# Patient Record
Sex: Male | Born: 1950 | Race: White | Hispanic: No | Marital: Single | Smoking: Never smoker
Health system: Southern US, Community
[De-identification: ages and names within clinical notes are randomized; demographics above are authoritative.]

## PROBLEM LIST (undated history)

## (undated) DIAGNOSIS — I96 Gangrene, not elsewhere classified: Secondary | ICD-10-CM

## (undated) DIAGNOSIS — I10 Essential (primary) hypertension: Secondary | ICD-10-CM

## (undated) DIAGNOSIS — E119 Type 2 diabetes mellitus without complications: Secondary | ICD-10-CM

## (undated) HISTORY — PX: TOE AMPUTATION: SHX809

## (undated) HISTORY — PX: TONSILLECTOMY: SUR1361

---
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
## 2006-07-11 ENCOUNTER — Inpatient Hospital Stay (HOSPITAL_COMMUNITY): Admission: EM | Admit: 2006-07-11 | Discharge: 2006-07-17 | Payer: Self-pay

## 2010-02-03 ENCOUNTER — Emergency Department (HOSPITAL_COMMUNITY): Admission: EM | Admit: 2010-02-03 | Discharge: 2010-02-04 | Payer: Self-pay | Admitting: Emergency Medicine

## 2010-02-04 ENCOUNTER — Ambulatory Visit: Payer: Self-pay | Admitting: Psychiatry

## 2010-02-04 ENCOUNTER — Inpatient Hospital Stay (HOSPITAL_COMMUNITY): Admission: EM | Admit: 2010-02-04 | Discharge: 2010-03-10 | Payer: Self-pay | Admitting: Psychiatry

## 2010-04-20 ENCOUNTER — Emergency Department (HOSPITAL_COMMUNITY): Admission: EM | Admit: 2010-04-20 | Discharge: 2010-04-21 | Payer: Self-pay | Admitting: Emergency Medicine

## 2010-04-21 ENCOUNTER — Ambulatory Visit: Payer: Self-pay | Admitting: Psychiatry

## 2010-10-27 LAB — COMPREHENSIVE METABOLIC PANEL
ALT: 32 U/L (ref 0–53)
Albumin: 4.5 g/dL (ref 3.5–5.2)
BUN: 9 mg/dL (ref 6–23)
Calcium: 9.6 mg/dL (ref 8.4–10.5)
Chloride: 104 mEq/L (ref 96–112)
Creatinine, Ser: 0.95 mg/dL (ref 0.4–1.5)
GFR calc Af Amer: >60 mL/min (ref >60)
GFR calc non Af Amer: >60 mL/min (ref >60)
Glucose, Bld: 234 mg/dL — ABNORMAL HIGH (ref 70–99)
Potassium: 3.6 mEq/L (ref 3.5–5.1)
Sodium: 142 mEq/L (ref 135–145)

## 2010-10-27 LAB — DIFFERENTIAL
Basophils Absolute: 0.1 10*3/uL (ref 0.0–0.1)
Eosinophils Absolute: 0.1 10*3/uL (ref 0.0–0.7)
Eosinophils Relative: 2 % (ref 0–5)
Lymphocytes Relative: 22 % (ref 12–46)
Monocytes Absolute: 0.5 10*3/uL (ref 0.1–1.0)
Neutrophils Relative %: 70 % (ref 43–77)

## 2010-10-27 LAB — URINALYSIS, ROUTINE W REFLEX MICROSCOPIC
Hgb urine dipstick: NEGATIVE
Protein, ur: NEGATIVE mg/dL

## 2010-10-27 LAB — CBC
MCV: 90.3 fL (ref 78.0–100.0)
RDW: 13.6 % (ref 11.5–15.5)

## 2010-10-27 LAB — RAPID URINE DRUG SCREEN, HOSP PERFORMED
Amphetamines: NOT DETECTED
Barbiturates: NOT DETECTED
Benzodiazepines: NOT DETECTED
Cocaine: NOT DETECTED
Opiates: NOT DETECTED

## 2010-10-27 LAB — ETHANOL: Alcohol, Ethyl (B): <5 mg/dL (ref 0–10)

## 2010-10-30 LAB — COMPREHENSIVE METABOLIC PANEL
Alkaline Phosphatase: 39 U/L (ref 39–117)
BUN: 9 mg/dL (ref 6–23)
Calcium: 8.5 mg/dL (ref 8.4–10.5)
Chloride: 105 mEq/L (ref 96–112)
Creatinine, Ser: 0.96 mg/dL (ref 0.4–1.5)
Glucose, Bld: 227 mg/dL — ABNORMAL HIGH (ref 70–99)
Total Protein: 6 g/dL (ref 6.0–8.3)

## 2010-10-30 LAB — CBC
MCH: 31.6 pg (ref 26.0–34.0)
Platelets: 200 10*3/uL (ref 150–400)
WBC: 7.8 10*3/uL (ref 4.0–10.5)

## 2010-10-30 LAB — GLUCOSE, CAPILLARY: Glucose-Capillary: 208 mg/dL — ABNORMAL HIGH (ref 70–99)

## 2010-10-30 LAB — RAPID URINE DRUG SCREEN, HOSP PERFORMED
Amphetamines: NOT DETECTED
Barbiturates: NOT DETECTED
Benzodiazepines: NOT DETECTED
Cocaine: NOT DETECTED
Opiates: NOT DETECTED
Tetrahydrocannabinol: NOT DETECTED

## 2010-10-30 LAB — DIFFERENTIAL
Basophils Absolute: 0 10*3/uL (ref 0.0–0.1)
Eosinophils Absolute: 0.1 10*3/uL (ref 0.0–0.7)
Eosinophils Relative: 2 % (ref 0–5)
Lymphocytes Relative: 21 % (ref 12–46)
Lymphs Abs: 1.6 10*3/uL (ref 0.7–4.0)
Monocytes Absolute: 0.4 10*3/uL (ref 0.1–1.0)
Monocytes Relative: 5 % (ref 3–12)
Neutro Abs: 5.6 10*3/uL (ref 1.7–7.7)

## 2010-10-30 LAB — ACETAMINOPHEN LEVEL: Acetaminophen (Tylenol), Serum: <10 ug/mL — ABNORMAL LOW (ref 10–30)

## 2010-10-30 LAB — URINALYSIS, ROUTINE W REFLEX MICROSCOPIC
Protein, ur: NEGATIVE mg/dL
pH: 6.5 (ref 5.0–8.0)

## 2010-10-30 LAB — ETHANOL: Alcohol, Ethyl (B): 5 mg/dL (ref 0–10)

## 2010-10-30 LAB — SALICYLATE LEVEL: Salicylate Lvl: <4 mg/dL (ref 2.8–20.0)
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
## 2011-02-06 ENCOUNTER — Emergency Department (HOSPITAL_COMMUNITY): Payer: Self-pay

## 2011-02-06 ENCOUNTER — Emergency Department (HOSPITAL_COMMUNITY)
Admission: EM | Admit: 2011-02-06 | Discharge: 2011-02-06 | Disposition: A | Discharge status: Home / Self Care | Payer: Self-pay | Attending: Emergency Medicine | Admitting: Emergency Medicine

## 2011-02-06 DIAGNOSIS — IMO0002 Reserved for concepts with insufficient information to code with codable children: Secondary | ICD-10-CM | POA: Insufficient documentation

## 2011-02-06 DIAGNOSIS — S92309A Fracture of unspecified metatarsal bone(s), unspecified foot, initial encounter for closed fracture: Secondary | ICD-10-CM | POA: Insufficient documentation

## 2011-02-06 DIAGNOSIS — M7989 Other specified soft tissue disorders: Secondary | ICD-10-CM | POA: Insufficient documentation

## 2011-02-06 DIAGNOSIS — F3289 Other specified depressive episodes: Secondary | ICD-10-CM | POA: Insufficient documentation

## 2011-02-06 DIAGNOSIS — F329 Major depressive disorder, single episode, unspecified: Secondary | ICD-10-CM | POA: Insufficient documentation

## 2011-02-06 DIAGNOSIS — S92919A Unspecified fracture of unspecified toe(s), initial encounter for closed fracture: Secondary | ICD-10-CM | POA: Insufficient documentation

## 2011-02-06 DIAGNOSIS — M79609 Pain in unspecified limb: Secondary | ICD-10-CM | POA: Insufficient documentation

## 2014-04-20 ENCOUNTER — Encounter (HOSPITAL_COMMUNITY): Payer: Self-pay | Admitting: Emergency Medicine

## 2014-04-20 ENCOUNTER — Emergency Department (HOSPITAL_COMMUNITY)
Admission: EM | Admit: 2014-04-20 | Discharge: 2014-04-20 | Discharge status: Against Medical Advice | Payer: Self-pay | Attending: Emergency Medicine | Admitting: Emergency Medicine

## 2014-04-20 DIAGNOSIS — F3289 Other specified depressive episodes: Secondary | ICD-10-CM | POA: Insufficient documentation

## 2014-04-20 DIAGNOSIS — R7309 Other abnormal glucose: Secondary | ICD-10-CM | POA: Insufficient documentation

## 2014-04-20 DIAGNOSIS — R739 Hyperglycemia, unspecified: Secondary | ICD-10-CM

## 2014-04-20 DIAGNOSIS — Z5900 Homelessness unspecified: Secondary | ICD-10-CM | POA: Insufficient documentation

## 2014-04-20 DIAGNOSIS — Z008 Encounter for other general examination: Secondary | ICD-10-CM | POA: Insufficient documentation

## 2014-04-20 DIAGNOSIS — F32A Depression, unspecified: Secondary | ICD-10-CM

## 2014-04-20 DIAGNOSIS — F329 Major depressive disorder, single episode, unspecified: Secondary | ICD-10-CM | POA: Insufficient documentation

## 2014-04-20 DIAGNOSIS — R45851 Suicidal ideations: Secondary | ICD-10-CM | POA: Insufficient documentation

## 2014-04-20 LAB — COMPREHENSIVE METABOLIC PANEL
ALK PHOS: 67 U/L (ref 39–117)
ALT: 16 U/L (ref 0–53)
AST: 15 U/L (ref 0–37)
Albumin: 3.9 g/dL (ref 3.5–5.2)
Anion gap: 17 — ABNORMAL HIGH (ref 5–15)
BUN: 8 mg/dL (ref 6–23)
CO2: 24 mEq/L (ref 19–32)
Calcium: 9.5 mg/dL (ref 8.4–10.5)
Chloride: 93 mEq/L — ABNORMAL LOW (ref 96–112)
Creatinine, Ser: 0.79 mg/dL (ref 0.50–1.35)
GFR calc non Af Amer: >90 mL/min (ref >90)
GLUCOSE: 490 mg/dL — AB (ref 70–99)
POTASSIUM: 3.3 meq/L — AB (ref 3.7–5.3)
SODIUM: 134 meq/L — AB (ref 137–147)
TOTAL PROTEIN: 7.2 g/dL (ref 6.0–8.3)
Total Bilirubin: 0.8 mg/dL (ref 0.3–1.2)

## 2014-04-20 LAB — CBC
HCT: 41.1 % (ref 39.0–52.0)
HEMOGLOBIN: 14.7 g/dL (ref 13.0–17.0)
MCH: 30.4 pg (ref 26.0–34.0)
MCHC: 35.8 g/dL (ref 30.0–36.0)
MCV: 85.1 fL (ref 78.0–100.0)
PLATELETS: 313 10*3/uL (ref 150–400)
RBC: 4.83 MIL/uL (ref 4.22–5.81)
RDW: 12.7 % (ref 11.5–15.5)
WBC: 8.6 10*3/uL (ref 4.0–10.5)

## 2014-04-20 LAB — RAPID URINE DRUG SCREEN, HOSP PERFORMED
AMPHETAMINES: NOT DETECTED
BARBITURATES: NOT DETECTED
BENZODIAZEPINES: NOT DETECTED
COCAINE: NOT DETECTED
Opiates: NOT DETECTED
TETRAHYDROCANNABINOL: NOT DETECTED

## 2014-04-20 LAB — ETHANOL: Alcohol, Ethyl (B): <11 mg/dL (ref 0–11)

## 2014-04-20 NOTE — Discharge Instructions (Signed)
Depression °Depression refers to feeling sad, low, down in the dumps, blue, gloomy, or empty. In general, there are two kinds of depression: °1. Normal sadness or normal grief. This kind of depression is one that we all feel from time to time after upsetting life experiences, such as the loss of a job or the ending of a relationship. This kind of depression is considered normal, is short lived, and resolves within a few days to 2 weeks. Depression experienced after the loss of a loved one (bereavement) often lasts longer than 2 weeks but normally gets better with time. °2. Clinical depression. This kind of depression lasts longer than normal sadness or normal grief or interferes with your ability to function at home, at work, and in school. It also interferes with your personal relationships. It affects almost every aspect of your life. Clinical depression is an illness. °Symptoms of depression can also be caused by conditions other than those mentioned above, such as: °· Physical illness. Some physical illnesses, including underactive thyroid gland (hypothyroidism), severe anemia, specific types of cancer, diabetes, uncontrolled seizures, heart and lung problems, strokes, and chronic pain are commonly associated with symptoms of depression. °· Side effects of some prescription medicine. In some people, certain types of medicine can cause symptoms of depression. °· Substance abuse. Abuse of alcohol and illicit drugs can cause symptoms of depression. °SYMPTOMS °Symptoms of normal sadness and normal grief include the following: °· Feeling sad or crying for short periods of time. °· Not caring about anything (apathy). °· Difficulty sleeping or sleeping too much. °· No longer able to enjoy the things you used to enjoy. °· Desire to be by oneself all the time (social isolation). °· Lack of energy or motivation. °· Difficulty concentrating or remembering. °· Change in appetite or weight. °· Restlessness or  agitation. °Symptoms of clinical depression include the same symptoms of normal sadness or normal grief and also the following symptoms: °· Feeling sad or crying all the time. °· Feelings of guilt or worthlessness. °· Feelings of hopelessness or helplessness. °· Thoughts of suicide or the desire to harm yourself (suicidal ideation). °· Loss of touch with reality (psychotic symptoms). Seeing or hearing things that are not real (hallucinations) or having false beliefs about your life or the people around you (delusions and paranoia). °DIAGNOSIS  °The diagnosis of clinical depression is usually based on how bad the symptoms are and how long they have lasted. Your health care provider will also ask you questions about your medical history and substance use to find out if physical illness, use of prescription medicine, or substance abuse is causing your depression. Your health care provider may also order blood tests. °TREATMENT  °Often, normal sadness and normal grief do not require treatment. However, sometimes antidepressant medicine is given for bereavement to ease the depressive symptoms until they resolve. °The treatment for clinical depression depends on how bad the symptoms are but often includes antidepressant medicine, counseling with a mental health professional, or both. Your health care provider will help to determine what treatment is best for you. °Depression caused by physical illness usually goes away with appropriate medical treatment of the illness. If prescription medicine is causing depression, talk with your health care provider about stopping the medicine, decreasing the dose, or changing to another medicine. °Depression caused by the abuse of alcohol or illicit drugs goes away when you stop using these substances. Some adults need professional help in order to stop drinking or using drugs. °SEEK IMMEDIATE MEDICAL   CARE IF:  You have thoughts about hurting yourself or others.  You lose touch  with reality (have psychotic symptoms).  You are taking medicine for depression and have a serious side effect. FOR MORE INFORMATION  National Alliance on Mental Illness: www.nami.[employer] of Mental Health: http://www.maynard.net/ Document Released: 07/28/2000 Document Revised: 12/15/2013 Document Reviewed: 10/30/2011 [facility] Patient Information 2015 [place], [place]. This information is not intended to replace advice given to you by your health care provider. Make sure you discuss any questions you have with your health care provider.  Hyperglycemia Hyperglycemia occurs when the glucose (sugar) in your blood is too high. Hyperglycemia can happen for many reasons, but it most often happens to people who do not know they have diabetes or are not managing their diabetes properly.  CAUSES  Whether you have diabetes or not, there are other causes of hyperglycemia. Hyperglycemia can occur when you have diabetes, but it can also occur in other situations that you might not be as aware of, such as: Diabetes  If you have diabetes and are having problems controlling your blood glucose, hyperglycemia could occur because of some of the following reasons:  Not following your meal plan.  Not taking your diabetes medications or not taking it properly.  Exercising less or doing less activity than you normally do.  Being sick. Pre-diabetes  This cannot be ignored. Before people develop Type 2 diabetes, they almost always have "pre-diabetes." This is when your blood glucose levels are higher than normal, but not yet high enough to be diagnosed as diabetes. Research has shown that some long-term damage to the body, especially the heart and circulatory system, may already be occurring during pre-diabetes. If you take action to manage your blood glucose when you have pre-diabetes, you may delay or prevent Type 2 diabetes from developing. Stress  If you have diabetes, you may be "diet" controlled  or on oral medications or insulin to control your diabetes. However, you may find that your blood glucose is higher than usual in the hospital whether you have diabetes or not. This is often referred to as "stress hyperglycemia." Stress can elevate your blood glucose. This happens because of hormones put out by the body during times of stress. If stress has been the cause of your high blood glucose, it can be followed regularly by your caregiver. That way he/she can make sure your hyperglycemia does not continue to get worse or progress to diabetes. Steroids  Steroids are medications that act on the infection fighting system (immune system) to block inflammation or infection. One side effect can be a rise in blood glucose. Most people can produce enough extra insulin to allow for this rise, but for those who cannot, steroids make blood glucose levels go even higher. It is not unusual for steroid treatments to "uncover" diabetes that is developing. It is not always possible to determine if the hyperglycemia will go away after the steroids are stopped. A special blood test called an A1c is sometimes done to determine if your blood glucose was elevated before the steroids were started. SYMPTOMS  Thirsty.  Frequent urination.  Dry mouth.  Blurred vision.  Tired or fatigue.  Weakness.  Sleepy.  Tingling in feet or leg. DIAGNOSIS  Diagnosis is made by monitoring blood glucose in one or all of the following ways:  A1c test. This is a chemical found in your blood.  Fingerstick blood glucose monitoring.  Laboratory results. TREATMENT  First, knowing the cause of the  hyperglycemia is important before the hyperglycemia can be treated. Treatment may include, but is not be limited to:  Education.  Change or adjustment in medications.  Change or adjustment in meal plan.  Treatment for an illness, infection, etc.  More frequent blood glucose monitoring.  Change in exercise  plan.  Decreasing or stopping steroids.  Lifestyle changes. HOME CARE INSTRUCTIONS   Test your blood glucose as directed.  Exercise regularly. Your caregiver will give you instructions about exercise. Pre-diabetes or diabetes which comes on with stress is helped by exercising.  Eat wholesome, balanced meals. Eat often and at regular, fixed times. Your caregiver or nutritionist will give you a meal plan to guide your sugar intake.  Being at an ideal weight is important. If needed, losing as little as 10 to 15 pounds may help improve blood glucose levels. SEEK MEDICAL CARE IF:   You have questions about medicine, activity, or diet.  You continue to have symptoms (problems such as increased thirst, urination, or weight gain). SEEK IMMEDIATE MEDICAL CARE IF:   You are vomiting or have diarrhea.  Your breath smells fruity.  You are breathing faster or slower.  You are very sleepy or incoherent.  You have numbness, tingling, or pain in your feet or hands.  You have chest pain.  Your symptoms get worse even though you have been following your caregiver's orders.  If you have any other questions or concerns. Document Released: 01/24/2001 Document Revised: 10/23/2011 Document Reviewed: 11/27/2011 [facility] Patient Information 2015 [place], [place]. This information is not intended to replace advice given to you by your health care provider. Make sure you discuss any questions you have with your health care provider.

## 2014-04-20 NOTE — ED Notes (Signed)
MD at bedside. 

## 2014-04-20 NOTE — ED Provider Notes (Signed)
CSN: [id number]     Arrival date & time 04/20/14  2042 History   First MD Initiated Contact with Patient 04/20/14 2206     Chief Complaint  Patient presents with  . Medical Clearance     (Consider location/radiation/quality/duration/timing/severity/associated sxs/prior Treatment) The history is provided by the patient.   patient has a history of depression. He also is battling with homelessness. He states that he has been having more difficulty recently. He states he'll have occasional suicidal thoughts, but is not actively suicidal. States he went to a place where he gets meals occasionally. He states he wanted to talk to the lady there about some things, but he states that she blew him off. He states that this made him angry and she then came out to his car and will minimally. He states that he then told her to watch the paper or the news for his obituaries. The police were then called. Patient states he just stated to get a rise out of her. He states he does have a previous history of suicide attempt. He states he is not suicidal now. He denies substance abuse. He states he is somewhat angry towards that lady, but is not going to hurt her. She does not want further evaluation and treatment here. He states he has been urinating somewhat frequently. No chest pain. No trouble breathing. He or  History reviewed. No pertinent past medical history. Past Surgical History  Procedure Laterality Date  . Tonsillectomy     History reviewed. No pertinent family history. History  Substance Use Topics  . Smoking status: Never Smoker   . Smokeless tobacco: Not on file  . Alcohol Use: No    Review of Systems  Constitutional: Negative for chills and fatigue.  Respiratory: Negative for chest tightness.   Cardiovascular: Negative for chest pain.  Gastrointestinal: Negative for abdominal pain.  Genitourinary: Positive for frequency.  Psychiatric/Behavioral: Positive for suicidal ideas. Negative for  confusion and decreased concentration.      Allergies  Review of patient's allergies indicates no known allergies.  Home Medications   Prior to Admission medications   Not on File   BP 157/93  Pulse 104  Temp(Src) 99.2 F (37.3 C) (Oral)  Resp 18  SpO2 96% Physical Exam  Constitutional: He appears well-developed.  HENT:  Head: Normocephalic.  Cardiovascular: Normal rate and regular rhythm.   Pulmonary/Chest: Effort normal.  Abdominal: Soft. Bowel sounds are normal.  Musculoskeletal: Normal range of motion.  Neurological: He is alert.  Skin: Skin is warm.  Psychiatric:  Patient appears somewhat angry. Her does not appear to be responding to internal stimuli.    ED Course  Procedures (including critical care time) Labs Review Labs Reviewed  COMPREHENSIVE METABOLIC PANEL - Abnormal; Notable for the following:    Sodium 134 (*)    Potassium 3.3 (*)    Chloride 93 (*)    Glucose, Bld 490 (*)    Anion gap 17 (*)    All other components within normal limits  CBC  ETHANOL  URINE RAPID DRUG SCREEN (HOSP PERFORMED)    Imaging Review No results found.   EKG Interpretation None      MDM   Final diagnoses:  Depression  Hyperglycemia    As patient then brought in by police.had made some suicidal statements he states that he did an anger. That he does not want further evaluation and treatment. He states his been told that his borderline diabetic, but does not believe the medication.  He is not willing to stay for further treatment. Sugar is 490 here. He was given resources her followup after I discussed with TTS. At this point I do not believe he has criteria to be involuntary committed, although he probably will require more followup. He may come to the point where he would require commitment, I don't think he is at that point yet.    Juliet Rude. Rubin Payor, MD 04/20/14 2302

## 2014-04-20 NOTE — ED Notes (Signed)
Pt brought in by [employer] voluntarily  Pt states he did not have a choice was told he could come voluntarily or they would take paperwork out on him  Pt states he said something he should not have in a moment of anger

## 2017-03-17 ENCOUNTER — Emergency Department (HOSPITAL_COMMUNITY)
Admission: EM | Admit: 2017-03-17 | Discharge: 2017-03-17 | Disposition: A | Discharge status: Home / Self Care | Payer: Self-pay | Attending: Emergency Medicine | Admitting: Emergency Medicine

## 2017-03-17 ENCOUNTER — Encounter (HOSPITAL_COMMUNITY): Payer: Self-pay | Admitting: *Deleted

## 2017-03-17 DIAGNOSIS — R42 Dizziness and giddiness: Secondary | ICD-10-CM

## 2017-03-17 DIAGNOSIS — R739 Hyperglycemia, unspecified: Secondary | ICD-10-CM

## 2017-03-17 DIAGNOSIS — T730XXA Starvation, initial encounter: Secondary | ICD-10-CM

## 2017-03-17 DIAGNOSIS — E860 Dehydration: Secondary | ICD-10-CM | POA: Insufficient documentation

## 2017-03-17 DIAGNOSIS — E119 Type 2 diabetes mellitus without complications: Secondary | ICD-10-CM | POA: Insufficient documentation

## 2017-03-17 HISTORY — DX: Type 2 diabetes mellitus without complications: E11.9

## 2017-03-17 LAB — CBG MONITORING, ED: GLUCOSE-CAPILLARY: 220 mg/dL — AB (ref 65–99)

## 2017-03-17 NOTE — ED Notes (Signed)
ASSESSMENT: pt without complaints at present time when Dr speaking with him. Feels underlying problem is hunger. Pt without complaints.

## 2017-03-17 NOTE — Discharge Instructions (Signed)
° °  HOMELESS SHELTERS  [facility]     [facility]   [address], [place]     [phone]              [facility] (women and children)       [address] [place], [place] [address] [phone] [email or web address] for application and process Application Required  [facility]   [address]    [place] [address]     [phone]                    [facility] [address] [place], [place] [address] [phone](schedule application appt.) Application Required  [facility] (women only)    [address]     [place], [place] [address]     [phone]      Intake starts 6pm daily Need valid ID, SSC, & Police report [facility] [place] [address] [place], [place] [phone] Application Required  [facility] (men only)     [address]      [place], [place]     [phone]       [facility] (Pregnant women only) [address] [place], [place] [phone]  [facility]      [address]      [place], [place] [address]     [phone]             [facility] [address] [place], [place] [phone] 90 day commitment/SA/Application process  [facility](men only)     [address]     [place], [place]     [phone]       Check-in at [facility]7pm            [facility] [address] [place], [place] [address] [phone] Men/Women/Women and Children must be there by 7 pm  [facility] [place], [place] [phone]

## 2017-03-17 NOTE — ED Triage Notes (Addendum)
EMS reports pt is homeless, lives in vehicle, Developed headache this am, describes as feeling of a band around his head. CBG 211 Pt verbalizes he is out of money and food.

## 2017-03-17 NOTE — ED Notes (Signed)
Pt is noted to be homeless. Per Charge, RN, pt allowed to rest in hall bed. Dr Rhunette CroftNanavati made aware

## 2017-03-17 NOTE — ED Provider Notes (Addendum)
  [facility]-EMERGENCY DEPT Provider Note   CSN: [id number] Arrival date & time: 03/17/17  1008     History   Chief Complaint Chief Complaint  Patient presents with  . Headache    HPI Bryan Farrell is a 66 y.o. male.  HPI Pt comes in with cc of headaches, dizziness. Pt reports that he is homeless. He was walking earlier today and started feeling dizzy and had a headache. His dizziness has resolved, 10 min after onset. Headache is 1/10 now. Headache was mild to moderate even at onset, frontal and dull. Pt denies associated chest pain, palpitations, dib, numbness, tingling, weakness.     Past Medical History:  Diagnosis Date  . Diabetes mellitus without complication (HCC)     There are no active problems to display for this patient.   Past Surgical History:  Procedure Laterality Date  . TONSILLECTOMY         Home Medications    Prior to Admission medications   Not on File    Family History No family history on file.  Social History Social History  Substance Use Topics  . Smoking status: Never Smoker  . Smokeless tobacco: Never Used  . Alcohol use No     Allergies   Patient has no known allergies.   Review of Systems Review of Systems  Constitutional: Negative for activity change.  Cardiovascular: Negative for chest pain.  Gastrointestinal: Negative for nausea.  Neurological: Positive for headaches.     Physical Exam Updated Vital Signs BP (!) 147/86 (BP Location: Right Arm)   Pulse 81   Temp 98.7 F (37.1 C) (Oral)   Resp 16   SpO2 96%   Physical Exam  Constitutional: He is oriented to person, place, and time. He appears well-developed.  HENT:  Head: Atraumatic.  Neck: Neck supple.  Cardiovascular: Normal rate.   Pulmonary/Chest: Effort normal.  Neurological: He is alert and oriented to person, place, and time.  Skin: Skin is warm.  Nursing note and vitals reviewed.    ED Treatments / Results  Labs (all labs ordered are listed,  but only abnormal results are displayed) Labs Reviewed  CBG MONITORING, ED - Abnormal; Notable for the following:       Result Value   Glucose-Capillary 220 (*)    All other components within normal limits    EKG  EKG Interpretation None       Radiology No results found.  Procedures Procedures (including critical care time)  Medications Ordered in ED Medications - No data to display   Initial Impression / Assessment and Plan / ED Course  I have reviewed the triage vital signs and the nursing notes.  Pertinent labs & imaging results that were available during my care of the patient were reviewed by me and considered in my medical decision making (see chart for details).    Pt comes in with cc of dizziness and headache. Dizziness resolved and no red flags with numbness. Headache has resolved. Non focal neuro exam. Will get CBG. Clinically, no concerns for DKA or severe hypoglycemia. Will hydrate orally. Ambulated w/o any problems. Resources provided.  Final Clinical Impressions(s) / ED Diagnoses   Final diagnoses:  Orthostatic dizziness  Hyperglycemia  Dehydration  Hunger pain, initial encounter    New Prescriptions New Prescriptions   No medications on file     Derwood KaplanNanavati, Jaden Batchelder, MD 03/17/17 1113    Derwood KaplanNanavati, Peony Barner, MD 03/17/17 1130

## 2017-03-17 NOTE — ED Notes (Signed)
Bed: [facility] Expected date:  Expected time:  Means of arrival:  Comments: 

## 2017-03-17 NOTE — ED Notes (Signed)
Pt provided with breakfast tray.

## 2017-03-18 ENCOUNTER — Emergency Department (HOSPITAL_COMMUNITY): Payer: Self-pay

## 2017-03-18 ENCOUNTER — Encounter (HOSPITAL_COMMUNITY): Payer: Self-pay | Admitting: *Deleted

## 2017-03-18 ENCOUNTER — Emergency Department (HOSPITAL_COMMUNITY)
Admission: EM | Admit: 2017-03-18 | Discharge: 2017-03-22 | Disposition: A | Discharge status: Other Acute Inpatient Hospital | Payer: No Typology Code available for payment source | Attending: Emergency Medicine | Admitting: Emergency Medicine

## 2017-03-18 DIAGNOSIS — E876 Hypokalemia: Secondary | ICD-10-CM | POA: Insufficient documentation

## 2017-03-18 DIAGNOSIS — R45851 Suicidal ideations: Secondary | ICD-10-CM | POA: Insufficient documentation

## 2017-03-18 DIAGNOSIS — F332 Major depressive disorder, recurrent severe without psychotic features: Secondary | ICD-10-CM | POA: Diagnosis present

## 2017-03-18 DIAGNOSIS — E119 Type 2 diabetes mellitus without complications: Secondary | ICD-10-CM | POA: Insufficient documentation

## 2017-03-18 LAB — COMPREHENSIVE METABOLIC PANEL
ALBUMIN: 3.9 g/dL (ref 3.5–5.0)
ALT: 13 U/L — AB (ref 17–63)
AST: 18 U/L (ref 15–41)
Alkaline Phosphatase: 59 U/L (ref 38–126)
Anion gap: 12 (ref 5–15)
BUN: 6 mg/dL (ref 6–20)
CHLORIDE: 91 mmol/L — AB (ref 101–111)
CO2: 32 mmol/L (ref 22–32)
Calcium: 9.1 mg/dL (ref 8.9–10.3)
Creatinine, Ser: 0.83 mg/dL (ref 0.61–1.24)
GFR calc non Af Amer: >60 mL/min (ref >60)
GLUCOSE: 225 mg/dL — AB (ref 65–99)
Potassium: 2.4 mmol/L — CL (ref 3.5–5.1)
SODIUM: 135 mmol/L (ref 135–145)
Total Bilirubin: 1.8 mg/dL — ABNORMAL HIGH (ref 0.3–1.2)
Total Protein: 7.2 g/dL (ref 6.5–8.1)

## 2017-03-18 LAB — RAPID URINE DRUG SCREEN, HOSP PERFORMED
AMPHETAMINES: NOT DETECTED
BARBITURATES: NOT DETECTED
Benzodiazepines: NOT DETECTED
COCAINE: NOT DETECTED
OPIATES: NOT DETECTED
TETRAHYDROCANNABINOL: NOT DETECTED

## 2017-03-18 LAB — CBC WITH DIFFERENTIAL/PLATELET
BASOS PCT: 0 %
Basophils Absolute: 0 10*3/uL (ref 0.0–0.1)
EOS ABS: 0.1 10*3/uL (ref 0.0–0.7)
EOS PCT: 1 %
HCT: 38.4 % — ABNORMAL LOW (ref 39.0–52.0)
Hemoglobin: 13.4 g/dL (ref 13.0–17.0)
Lymphocytes Relative: 17 %
Lymphs Abs: 1.4 10*3/uL (ref 0.7–4.0)
MCH: 29.8 pg (ref 26.0–34.0)
MCHC: 34.9 g/dL (ref 30.0–36.0)
MCV: 85.3 fL (ref 78.0–100.0)
MONO ABS: 0.6 10*3/uL (ref 0.1–1.0)
MONOS PCT: 7 %
Neutro Abs: 6.2 10*3/uL (ref 1.7–7.7)
Neutrophils Relative %: 75 %
PLATELETS: 322 10*3/uL (ref 150–400)
RBC: 4.5 MIL/uL (ref 4.22–5.81)
RDW: 13.7 % (ref 11.5–15.5)
WBC: 8.3 10*3/uL (ref 4.0–10.5)

## 2017-03-18 LAB — ETHANOL: Alcohol, Ethyl (B): <5 mg/dL (ref <5)

## 2017-03-18 LAB — POTASSIUM: Potassium: 2.5 mmol/L — CL (ref 3.5–5.1)

## 2017-03-18 MED ORDER — POTASSIUM CHLORIDE CRYS ER 20 MEQ PO TBCR
40.0000 meq | EXTENDED_RELEASE_TABLET | Freq: Once | ORAL | Status: AC
  Administered 2017-03-18: 40 meq via ORAL
  Filled 2017-03-18: qty 2

## 2017-03-18 MED ORDER — MAGNESIUM SULFATE 2 GM/50ML IV SOLN
2.0000 g | INTRAVENOUS | Status: AC
  Administered 2017-03-18: 2 g via INTRAVENOUS
  Filled 2017-03-18: qty 50

## 2017-03-18 MED ORDER — POTASSIUM CHLORIDE 10 MEQ/100ML IV SOLN
10.0000 meq | Freq: Once | INTRAVENOUS | Status: AC
  Administered 2017-03-18: 10 meq via INTRAVENOUS
  Filled 2017-03-18: qty 100

## 2017-03-18 NOTE — BH Assessment (Signed)
Assessment Note  Bryan Farrell is an 66 y.o. male. [facility]. He reports fatigue, isolating self from others, loss of interest in usual pleasures, and worthlessness. Stressors include lack of a support system, homeless, no job, and financial stress. Patient living out of his car for the past several months. Patient stating that he doesn't know where his car and thinks it may have been stolen. Patient asked if he was suicidal and he responds, "Minor". Patient appears to be passively suicidal, no plan, and/or intent. No self mutilating behaviors. No access to firearms. No HI. He is calm and cooperative. No legal issues. No AVH's. No alcohol and drug use reported. [facility] for suicidal ideations. No outpatient psychiatric providers reported.     Diagnosis: Major Depressive Disorder, Recurrent, Severe, with psychotic features  Past Medical History:  Past Medical History:  Diagnosis Date  . Diabetes mellitus without complication [facility](HCC)     Past Surgical History:  Procedure Laterality Date  . TONSILLECTOMY      Family History: No family history on file.  Social History:  reports that he has never smoked. He has never used smokeless tobacco. He reports that he does not drink alcohol or use drugs.  Additional Social History:  Alcohol / Drug Use Pain Medications: SEE MAR Prescriptions: SEE MAR Over the Counter: SEE MAR History of alcohol / drug use?: No history of alcohol / drug abuse  CIWA: CIWA-Ar BP: 126/76 Pulse Rate: 88 COWS:    Allergies: No Known Allergies  Home Medications:  (Not in a hospital admission)  OB/GYN Status:  No LMP for male patient.  General Assessment Data [facility] of Assessment: [facility] TTS Assessment: In system Is this a Tele or Face-to-Face Assessment?: Face-to-Face Is this an Initial Assessment or a Re-assessment for this encounter?: Initial Assessment Marital status:  Single Maiden name:  (n/a) Is patient pregnant?: No Pregnancy Status: No Living Arrangements: Alone Can pt return to current living arrangement?: Yes Admission Status: Voluntary Is patient capable of signing voluntary admission?: Yes Referral Source: Self/Family/Friend     Crisis Care Plan Living Arrangements: Alone Legal Guardian: Other: (no legal guardian) Name of Psychiatrist:  (no psychiatrist ) Name of Therapist:  (no therapist)  Education Status Is patient currently in school?: No Current Grade:  (n/a) Highest grade of school patient has completed:  (n/a) Name of school:  (n/a) Contact person:  (n/a)  Risk to self with the past 6 months Suicidal Ideation: Yes-Currently Present Has patient been a risk to self within the past 6 months prior to admission? : No Suicidal Intent: No Has patient had any suicidal intent within the past 6 months prior to admission? : No Is patient at risk for suicide?: No Suicidal Plan?: No Has patient had any suicidal plan within the past 6 months prior to admission? : No Access to Means: No What has been your use of drugs/alcohol within the last 12 months?:  (patient denies ) Substance abuse history and/or treatment for substance abuse?: No  Risk to Others within the past 6 months Homicidal Ideation: No Does patient have any lifetime risk of violence toward others beyond the six months prior to admission? : No Thoughts of Harm to Others: No Current Homicidal Intent: No Current Homicidal Plan: No Access to Homicidal Means: No Identified Victim:  (n/a) History of harm to others?: No Assessment of Violence: None Noted Violent Behavior Description:  (patient is calm and cooperative )  Does patient have access to weapons?: No Criminal Charges Pending?: No Does patient have a court date: No Is patient on probation?: No  Psychosis Hallucinations: None noted Delusions: None noted  Mental Status Report Appearance/Hygiene: In scrubs Eye  Contact: Good Motor Activity: Freedom of movement Speech: Logical/coherent Level of Consciousness: Alert Mood: Depressed Affect: Sad Anxiety Level: None Judgement: Impaired Orientation: Person, Place, Time, Situation Obsessive Compulsive Thoughts/Behaviors: None  Cognitive Functioning Concentration: Decreased Memory: Recent Intact, Remote Intact IQ: Average Insight: Poor Impulse Control: Poor Appetite: Fair Weight Loss:  (none reported) Weight Gain:  (none reported) Sleep: Decreased Total Hours of Sleep:  (varies ) Vegetative Symptoms: None  ADLScreening [facility] Assessment Services) Patient's cognitive ability adequate to safely complete daily activities?: Yes Patient able to express need for assistance with ADLs?: Yes Independently performs ADLs?: No  Prior Inpatient Therapy Prior Inpatient Therapy: No Prior Therapy Dates:  (n/a) Prior Therapy Facilty/Provider(s):  (n/a) Reason for Treatment:  (depression and suicide attempt )  Prior Outpatient Therapy Prior Outpatient Therapy: No Prior Therapy Dates:  (n/a) Prior Therapy Facilty/Provider(s):  (n/a) Reason for Treatment:  (n/a) Does patient have an ACCT team?: No Does patient have Intensive In-House Services?  : No Does patient have [facility] services? : No Does patient have P4CC services?: No  ADL Screening (condition at time of admission) Patient's cognitive ability adequate to safely complete daily activities?: Yes Is the patient deaf or have difficulty hearing?: No Does the patient have difficulty seeing, even when wearing glasses/contacts?: No Does the patient have difficulty concentrating, remembering, or making decisions?: No Patient able to express need for assistance with ADLs?: Yes Does the patient have difficulty dressing or bathing?: Yes Independently performs ADLs?: No Does the patient have difficulty walking or climbing stairs?: No Weakness of Legs: None Weakness of Arms/Hands: None  Home Assistive  Devices/Equipment Home Assistive Devices/Equipment: None    Abuse/Neglect Assessment (Assessment to be complete while patient is alone) Physical Abuse: Denies Verbal Abuse: Denies Sexual Abuse: Denies Exploitation of patient/patient's resources: Denies Self-Neglect: Denies Values / Beliefs Cultural Requests During Hospitalization: None Spiritual Requests During Hospitalization: None   Advance Directives (For Healthcare) Does Patient Have a Medical Advance Directive?: No Would patient like information on creating a medical advance directive?: No - Patient declined Nutrition Screen- [facility] Adult/[facility] Patient's home diet: Regular  Additional Information 1:1 In Past 12 Months?: No CIRT Risk: No Elopement Risk: No Does patient have medical clearance?: Yes     Disposition: Per Elta GuadeloupeLaurie Parks, NP, patient to remain in the ED overnight. Pending am psych evaluation.  Disposition Initial Assessment Completed for this Encounter: Yes Disposition of Patient: Other dispositions (Per Elta GuadeloupeLaurie Parks, NP, patient to remain in the ED overnight) Other disposition(s): Other (Comment) (Pending am psy evaluation )  On Site Evaluation by:   Reviewed with Physician:    Melynda Rippleoyka Paralee Pendergrass 03/18/2017 5:59 PM

## 2017-03-18 NOTE — ED Provider Notes (Signed)
[facility]-EMERG[facility] DEPT Provider Note   CSN: [id number] Arrival date & time: 03/18/17  1142     History   Chief Complaint Chief Complaint  Patient presents with  . Suicidal    HPI Pearlie OysterDonald Lewis Manlove is a 66 y.o. male.  Patient with history of diabetes, on the medications, history of homelessness presents with complaint of suicidal ideation. Patient [facility] and was referred to the emergency department. He reports running out of money and being very hopeless. He states that he wants to harm himself. He does not have a plan. Patient denies drugs or alcohol. He denies any chronic medical problems except for a chronic cough which he has had for "a very long time". No recent illness. The onset of this condition was acute. The course is constant. Aggravating factors: none. Alleviating factors: none.        Past Medical History:  Diagnosis Date  . Diabetes mellitus without complication (HCC)     There are no active problems to display for this patient.   Past Surgical History:  Procedure Laterality Date  . TONSILLECTOMY         Home Medications    Prior to Admission medications   Not on File    Family History No family history on file.  Social History Social History  Substance Use Topics  . Smoking status: Never Smoker  . Smokeless tobacco: Never Used  . Alcohol use No     Allergies   Patient has no known allergies.   Review of Systems Review of Systems  Constitutional: Negative for fever.  HENT: Negative for rhinorrhea and sore throat.   Eyes: Negative for redness.  Respiratory: Positive for cough.   Cardiovascular: Negative for chest pain.  Gastrointestinal: Negative for abdominal pain, diarrhea, nausea and vomiting.  Genitourinary: Negative for dysuria.  Musculoskeletal: Negative for myalgias.  Skin: Negative for rash.  Neurological: Negative for headaches.  Psychiatric/Behavioral: Positive for dysphoric mood and  suicidal ideas. Negative for self-injury.     Physical Exam Updated Vital Signs BP 133/84 (BP Location: Right Arm)   Pulse 85   Temp 98.2 F (36.8 C) (Oral)   Resp 17   SpO2 98%   Physical Exam  Constitutional: He appears well-developed and well-nourished.  HENT:  Head: Normocephalic and atraumatic.  Mouth/Throat: Oropharynx is clear and moist.  Eyes: Conjunctivae are normal. Right eye exhibits no discharge. Left eye exhibits no discharge.  Neck: Normal range of motion. Neck supple.  Cardiovascular: Normal rate, regular rhythm and normal heart sounds.   Pulmonary/Chest: Effort normal and breath sounds normal. No respiratory distress. He has no wheezes. He has no rales.  Abdominal: Soft. There is no tenderness.  Neurological: He is alert.  Skin: Skin is warm and dry.  Psychiatric: He has a normal mood and affect.  Nursing note and vitals reviewed.    ED Treatments / Results  Labs (all labs ordered are listed, but only abnormal results are displayed) Labs Reviewed  COMPREHENSIVE METABOLIC PANEL - Abnormal; Notable for the following:       Result Value   Potassium 2.4 (*)    Chloride 91 (*)    Glucose, Bld 225 (*)    ALT 13 (*)    Total Bilirubin 1.8 (*)    All other components within normal limits  CBC WITH DIFFERENTIAL/PLATELET - Abnormal; Notable for the following:    HCT 38.4 (*)    All other components within normal limits  ETHANOL  RAPID URINE DRUG SCREEN, HOSP PERFORMED    EKG  EKG Interpretation  Date/Time:  Sunday March 18 2017 12:09:38 EDT Ventricular Rate:  84 PR Interval:  164 QRS Duration: 86 QT Interval:  424 QTC Calculation: 501 R Axis:   40 Text Interpretation:  Sinus rhythm with Premature ventricular complexes or Fusion complexes ST abnormality, possible digitalis effect Prolonged QT Abnormal ECG Confirmed by Rolan BuccoBelfi, Melanie [phone]) on 03/18/2017 2:11:22 PM       Radiology Dg Wrist Complete Right  Result Date: 03/18/2017 CLINICAL DATA:  Pt  states he fell in the shower today and caught his fall with his right hand. Pt has posterior right wrist pain and slight swelling. EXAM: RIGHT WRIST - COMPLETE 3+ VIEW COMPARISON:  None. FINDINGS: No distal radius or ulnar fracture. Radiocarpal joint is intact. No carpal fracture. No soft tissue abnormality. Arterial vascular calcifications noted IMPRESSION: No fracture or dislocation. Electronically Signed   By: Genevive BiStewart  Edmunds M.D.   On: 03/18/2017 15:08    Procedures Procedures (including critical care time)  Medications Ordered in ED Medications  potassium chloride SA (K-DUR,KLOR-CON) CR tablet 40 mEq (40 mEq Oral Given 03/18/17 1445)  potassium chloride SA (K-DUR,KLOR-CON) CR tablet 40 mEq (40 mEq Oral Given 03/18/17 1600)     Initial Impression / Assessment and Plan / ED Course  I have reviewed the triage vital signs and the nursing notes.  Pertinent labs & imaging results that were available during my care of the patient were reviewed by me and considered in my medical decision making (see chart for details).     Patient seen and examined. Medical screening shows hypokalemia at 2.4. Patient appears to be asymptomatic. Will replete orally. Will give 40 mEq now and repeat in one hour. Will check EKG for changes.  Vital signs reviewed and are as follows: BP 133/84 (BP Location: Right Arm)   Pulse 85   Temp 98.2 F (36.8 C) (Oral)   Resp 17   SpO2 98%   4:16 PM 2nd dose K given. Will recheck K at 1730 -- order placed. TTS consult requested.   Handoff to Dr. Hyacinth MeekerMiller at shift change.   Final Clinical Impressions(s) / ED Diagnoses   Final diagnoses:  Suicidal ideation  Hypokalemia   Pending completion of medical clearance, TTS consults for reccs.   New Prescriptions New Prescriptions   No medications on file     Renne CriglerGeiple, Kengo Sturges, Cordelia Poche-C 03/18/17 1618    Rolan BuccoBelfi, Melanie, MD 03/18/17 [phone]

## 2017-03-18 NOTE — ED Notes (Signed)
TTS in progress 

## 2017-03-18 NOTE — ED Triage Notes (Signed)
To ED voluntarily with complaint of depression and being suicidal. Pt [facility]. Pt states he has recently experienced loss of family support and homelessness. States his car with everything he owns is parks [facility] [employer] [place]. Cooperative. No plan

## 2017-03-18 NOTE — ED Notes (Signed)
This nurse called to shower.  This nurse and Chasity EMT in bathroom assisting pt.  Pt states he slipped and fell.  Denies hitting head.  C/o right wrist pain.  Nilda CalamityAdvised Geiple, PA.

## 2017-03-19 DIAGNOSIS — F419 Anxiety disorder, unspecified: Secondary | ICD-10-CM

## 2017-03-19 DIAGNOSIS — Z590 Homelessness: Secondary | ICD-10-CM | POA: Diagnosis not present

## 2017-03-19 DIAGNOSIS — Z6379 Other stressful life events affecting family and household: Secondary | ICD-10-CM

## 2017-03-19 DIAGNOSIS — Z560 Unemployment, unspecified: Secondary | ICD-10-CM | POA: Diagnosis not present

## 2017-03-19 DIAGNOSIS — F332 Major depressive disorder, recurrent severe without psychotic features: Secondary | ICD-10-CM | POA: Diagnosis not present

## 2017-03-19 DIAGNOSIS — R45851 Suicidal ideations: Secondary | ICD-10-CM

## 2017-03-19 LAB — BASIC METABOLIC PANEL
Anion gap: 9 (ref 5–15)
Anion gap: 9 (ref 5–15)
BUN: 6 mg/dL (ref 6–20)
BUN: 6 mg/dL (ref 6–20)
CHLORIDE: 94 mmol/L — AB (ref 101–111)
CHLORIDE: 98 mmol/L — AB (ref 101–111)
CO2: 31 mmol/L (ref 22–32)
CO2: 30 mmol/L (ref 22–32)
CREATININE: 0.75 mg/dL (ref 0.61–1.24)
Calcium: 8.4 mg/dL — ABNORMAL LOW (ref 8.9–10.3)
Calcium: 7.9 mg/dL — ABNORMAL LOW (ref 8.9–10.3)
Creatinine, Ser: 0.61 mg/dL (ref 0.61–1.24)
GFR calc Af Amer: >60 mL/min (ref >60)
GFR calc Af Amer: >60 mL/min (ref >60)
GFR calc non Af Amer: >60 mL/min (ref >60)
GLUCOSE: 274 mg/dL — AB (ref 65–99)
GLUCOSE: 248 mg/dL — AB (ref 65–99)
POTASSIUM: 3 mmol/L — AB (ref 3.5–5.1)
Potassium: 2.6 mmol/L — CL (ref 3.5–5.1)
SODIUM: 134 mmol/L — AB (ref 135–145)
Sodium: 137 mmol/L (ref 135–145)

## 2017-03-19 LAB — MAGNESIUM
MAGNESIUM: 1.8 mg/dL (ref 1.7–2.4)
Magnesium: 2.1 mg/dL (ref 1.7–2.4)

## 2017-03-19 MED ORDER — POTASSIUM CHLORIDE CRYS ER 20 MEQ PO TBCR
40.0000 meq | EXTENDED_RELEASE_TABLET | Freq: Once | ORAL | Status: AC
  Administered 2017-03-19: 40 meq via ORAL
  Filled 2017-03-19: qty 2

## 2017-03-19 MED ORDER — MAGNESIUM SULFATE 2 GM/50ML IV SOLN
2.0000 g | Freq: Once | INTRAVENOUS | Status: AC
  Administered 2017-03-19: 2 g via INTRAVENOUS
  Filled 2017-03-19: qty 50

## 2017-03-19 MED ORDER — POTASSIUM CHLORIDE 10 MEQ/100ML IV SOLN
10.0000 meq | Freq: Once | INTRAVENOUS | Status: AC
  Administered 2017-03-19: 10 meq via INTRAVENOUS
  Filled 2017-03-19: qty 100

## 2017-03-19 MED ORDER — POTASSIUM CHLORIDE 10 MEQ/100ML IV SOLN
10.0000 meq | INTRAVENOUS | Status: AC
  Administered 2017-03-19 (×4): 10 meq via INTRAVENOUS
  Filled 2017-03-19 (×4): qty 100

## 2017-03-19 MED ORDER — POTASSIUM CHLORIDE CRYS ER 20 MEQ PO TBCR
10.0000 meq | EXTENDED_RELEASE_TABLET | Freq: Two times a day (BID) | ORAL | Status: DC
  Administered 2017-03-19: 10 meq via ORAL
  Filled 2017-03-19: qty 1

## 2017-03-19 NOTE — ED Triage Notes (Signed)
Lab called for add on lab test [facility]  . Order sent via tube system to lab as requested.

## 2017-03-19 NOTE — Consult Note (Signed)
Telepsych Consultation   Reason for Consult: Depression and suicide ideations.  Referring Physician: EDP Patient Identification: Bryan Farrell MRN:  [id number] Principal Diagnosis: <principal problem not specified> Diagnosis:  There are no active problems to display for this patient.   Total Time spent with patient: 30 minutes  Subjective:   Bryan Farrell is a 66 y.o. male patient admitted with Major Depressive Disorder, Recurrent, Severe, with psychotic features.  HPI: Per the assessment completed on 03/18/17 by Melynda Ripple: Bryan Farrell is an 66 y.o. male. He presents to [facility] with depression. He reports fatigue, isolating self from others, loss of interest in usual pleasures, and worthlessness. Stressors include lack of a support system, homeless, no job, and financial stress. Patient living out of his car for the past several months. Patient stating that he doesn't know where his car and thinks it may have been stolen. Patient asked if he was suicidal and he responds, "Minor". Patient appears to be passively suicidal, no plan, and/or intent. No self mutilating behaviors. No access to firearms. No HI. He is calm and cooperative. No legal issues. No AVH's. No alcohol and drug use reported. Patient admitted to [facility] for Inpatient treatment last year for suicidal ideations. No outpatient psychiatric providers reported.   On Exam: Patient was seen, chart reviewed with treatment team. Patient was sitting up in bed, awake, alert and oriented x4. He reiterated the reason for this hospital admission as documented above. He stated, "I came to the hospital because of depression and suicide ideations". He stated that the trigger was being homeless due to running out of money he got from inheritance. He said that he has no where to go upon discharge. When asked if he has any family member to assist him, he said, yes and no. Then he said he hasn't tried to reach out to them for support; upon  further enquiry, patient stated I don't know. He said right now he is not suicidal but not sure what will happen if we discharge him. He reported not having any OP psychiatrist or therapist and have no means to follow up. He said that he doesn't believe in medications and don't want medications for depressions. Patient rated his depression 4/10 (10 being the worse depressive state). He denies any HI/VAH. Patient does not appear to be responding to internal stimuli but was very reluctant with providing details for this assessment. He kept saying "I don't know" to most questions but answers with multiple probing.  Past Psychiatric History: See H&P  Risk to Self: Suicidal Ideation: Yes-Currently Present Suicidal Intent: No Is patient at risk for suicide?: No Suicidal Plan?: No Access to Means: No What has been your use of drugs/alcohol within the last 12 months?:  (patient denies ) Risk to Others: Homicidal Ideation: No Thoughts of Harm to Others: No Current Homicidal Intent: No Current Homicidal Plan: No Access to Homicidal Means: No Identified Victim:  (n/a) History of harm to others?: No Assessment of Violence: None Noted Violent Behavior Description:  (patient is calm and cooperative ) Does patient have access to weapons?: No Criminal Charges Pending?: No Does patient have a court date: No Prior Inpatient Therapy: Prior Inpatient Therapy: No Prior Therapy Dates:  (n/a) Prior Therapy Facilty/Provider(s):  (n/a) Reason for Treatment:  (depression and suicide attempt ) Prior Outpatient Therapy: Prior Outpatient Therapy: No Prior Therapy Dates:  (n/a) Prior Therapy Facilty/Provider(s):  (n/a) Reason for Treatment:  (n/a) Does patient have an ACCT team?: No Does patient have  Intensive In-House Services?  : No Does patient have [facility] services? : No Does patient have P4CC services?: No  Past Medical History:  Past Medical History:  Diagnosis Date  . Diabetes mellitus without  complication [facility])     Past Surgical History:  Procedure Laterality Date  . TONSILLECTOMY     Family History: No family history on file. Family Psychiatric  History:   Social History:  History  Alcohol Use No     History  Drug Use No    Social History   Social History  . Marital status: Single    Spouse name: N/A  . Number of children: N/A  . Years of education: N/A   Social History Main Topics  . Smoking status: Never Smoker  . Smokeless tobacco: Never Used  . Alcohol use No  . Drug use: No  . Sexual activity: Not Asked   Other Topics Concern  . None   Social History Narrative  . None   Additional Social History:    Allergies:  No Known Allergies  Labs:  Results for orders placed or performed during the hospital encounter of 03/18/17 (from the past 48 hour(s))  Comprehensive metabolic panel     Status: Abnormal   Collection Time: 03/18/17 11:59 AM  Result Value Ref Range   Sodium 135 135 - 145 mmol/L   Potassium 2.4 (LL) 3.5 - 5.1 mmol/L    Comment: CRITICAL RESULT CALLED TO, READ BACK BY AND VERIFIED WITH: C.[facility] RN @ 7564 03/18/17 BY C.EDENS    Chloride 91 (L) 101 - 111 mmol/L   CO2 32 22 - 32 mmol/L   Glucose, Bld 225 (H) 65 - 99 mg/dL   BUN 6 6 - 20 mg/dL   Creatinine, Ser 0.83 0.61 - 1.24 mg/dL   Calcium 9.1 8.9 - 10.3 mg/dL   Total Protein 7.2 6.5 - 8.1 g/dL   Albumin 3.9 3.5 - 5.0 g/dL   AST 18 15 - 41 U/L   ALT 13 (L) 17 - 63 U/L   Alkaline Phosphatase 59 38 - 126 U/L   Total Bilirubin 1.8 (H) 0.3 - 1.2 mg/dL   GFR calc non Af Amer >60 >60 mL/min   GFR calc Af Amer >60 >60 mL/min    Comment: (NOTE) The eGFR has been calculated using the CKD EPI equation. This calculation has not been validated in all clinical situations. eGFR's persistently <60 mL/min signify possible Chronic Kidney Disease.    Anion gap 12 5 - 15  Ethanol     Status: None   Collection Time: 03/18/17 11:59 AM  Result Value Ref Range   Alcohol, Ethyl (B) <5 <5 mg/dL     Comment:        LOWEST DETECTABLE LIMIT FOR SERUM ALCOHOL IS 5 mg/dL FOR MEDICAL PURPOSES ONLY   CBC with Diff     Status: Abnormal   Collection Time: 03/18/17 11:59 AM  Result Value Ref Range   WBC 8.3 4.0 - 10.5 K/uL   RBC 4.50 4.22 - 5.81 MIL/uL   Hemoglobin 13.4 13.0 - 17.0 g/dL   HCT 38.4 (L) 39.0 - 52.0 %   MCV 85.3 78.0 - 100.0 fL   MCH 29.8 26.0 - 34.0 pg   MCHC 34.9 30.0 - 36.0 g/dL   RDW 13.7 11.5 - 15.5 %   Platelets 322 150 - 400 K/uL   Neutrophils Relative % 75 %   Neutro Abs 6.2 1.7 - 7.7 K/uL   Lymphocytes Relative 17 %  Lymphs Abs 1.4 0.7 - 4.0 K/uL   Monocytes Relative 7 %   Monocytes Absolute 0.6 0.1 - 1.0 K/uL   Eosinophils Relative 1 %   Eosinophils Absolute 0.1 0.0 - 0.7 K/uL   Basophils Relative 0 %   Basophils Absolute 0.0 0.0 - 0.1 K/uL  Potassium     Status: Abnormal   Collection Time: 03/18/17  5:39 PM  Result Value Ref Range   Potassium 2.5 (LL) 3.5 - 5.1 mmol/L    Comment: CRITICAL RESULT CALLED TO, READ BACK BY AND VERIFIED WITH: CALLIE STRAUGHAN,RN AT 3009 03/18/17 BY ZBEECH.   Urine rapid drug screen (hosp performed)     Status: None   Collection Time: 03/18/17 10:33 PM  Result Value Ref Range   Opiates NONE DETECTED NONE DETECTED   Cocaine NONE DETECTED NONE DETECTED   Benzodiazepines NONE DETECTED NONE DETECTED   Amphetamines NONE DETECTED NONE DETECTED   Tetrahydrocannabinol NONE DETECTED NONE DETECTED   Barbiturates NONE DETECTED NONE DETECTED    Comment:        DRUG SCREEN FOR MEDICAL PURPOSES ONLY.  IF CONFIRMATION IS NEEDED FOR ANY PURPOSE, NOTIFY LAB WITHIN 5 DAYS.        LOWEST DETECTABLE LIMITS FOR URINE DRUG SCREEN Drug Class       Cutoff (ng/mL) Amphetamine      1000 Barbiturate      200 Benzodiazepine   233 Tricyclics       007 Opiates          300 Cocaine          300 THC              50   Basic metabolic panel     Status: Abnormal   Collection Time: 03/19/17  7:40 AM  Result Value Ref Range   Sodium 134 (L)  135 - 145 mmol/L   Potassium 2.6 (LL) 3.5 - 5.1 mmol/L    Comment: CRITICAL RESULT CALLED TO, READ BACK BY AND VERIFIED WITH: AUDREY MCKEOWN,RN AT 0815 03/19/17 BY ZBEECH.    Chloride 94 (L) 101 - 111 mmol/L   CO2 31 22 - 32 mmol/L   Glucose, Bld 274 (H) 65 - 99 mg/dL   BUN 6 6 - 20 mg/dL   Creatinine, Ser 0.75 0.61 - 1.24 mg/dL   Calcium 8.4 (L) 8.9 - 10.3 mg/dL   GFR calc non Af Amer >60 >60 mL/min   GFR calc Af Amer >60 >60 mL/min    Comment: (NOTE) The eGFR has been calculated using the CKD EPI equation. This calculation has not been validated in all clinical situations. eGFR's persistently <60 mL/min signify possible Chronic Kidney Disease.    Anion gap 9 5 - 15  Magnesium     Status: None   Collection Time: 03/19/17  7:40 AM  Result Value Ref Range   Magnesium 1.8 1.7 - 2.4 mg/dL    Current Facility-Administered Medications  Medication Dose Route Frequency Provider Last Rate Last Dose  . magnesium sulfate IVPB 2 g 50 mL  2 g Intravenous Once Daleen Bo, MD      . potassium chloride 10 mEq in 100 mL IVPB  10 mEq Intravenous Q1 Hr x 4 Daleen Bo, MD 100 mL/hr at 03/19/17 1203 10 mEq at 03/19/17 1203   No current outpatient prescriptions on file.    Musculoskeletal: UTA via camera  Psychiatric Specialty Exam: Physical Exam  Nursing note and vitals reviewed.   Review of Systems  Psychiatric/Behavioral: Positive  for depression and suicidal ideas (passive ideations). Negative for hallucinations, memory loss and substance abuse. The patient is nervous/anxious. The patient does not have insomnia.   All other systems reviewed and are negative.   Blood pressure 131/78, pulse 71, temperature 98.4 F (36.9 C), temperature source Oral, resp. rate 18, SpO2 99 %.There is no height or weight on file to calculate BMI.  General Appearance: on hospital scrub  Eye Contact:  Fair  Speech:  Clear and Coherent and Normal Rate  Volume:  Normal  Mood:  Depressed, Hopeless and  helpless  Affect:  Depressed and Flat  Thought Process:  Coherent and Goal Directed  Orientation:  Full (Time, Place, and Person)  Thought Content:  Tangential  Suicidal Thoughts:  Yes.  without intent/plan  Homicidal Thoughts:  No  Memory:  Immediate;   Good Recent;   Fair Remote;   Fair  Judgement:  Fair  Insight:  Shallow  Psychomotor Activity:  Normal  Concentration:  Concentration: Good and Attention Span: Fair  Recall:  [employer] of Knowledge:  Fair  Language:  Good  Akathisia:  Negative  Handed:  Right  AIMS (if indicated):     Assets:  Communication Skills Desire for Improvement Leisure Time  ADL's:  Intact  Cognition:  WNL  Sleep:        Treatment Plan Summary: Daily contact with patient to assess and evaluate symptoms and progress in treatment  Patient is unable to contract for safety upon discharge.  Disposition: Recommend psychiatric Inpatient admission when medically cleared.  Vicenta Aly, NP 03/19/2017 12:17 PM

## 2017-03-19 NOTE — ED Triage Notes (Signed)
PT refuses pain medication .

## 2017-03-19 NOTE — ED Notes (Signed)
Ordered reg tray 

## 2017-03-19 NOTE — Progress Notes (Signed)
Patient meets criteria for inpatient treatment. CSW faxed referrals to the following inpatient facilities for review:  [facility], [facility], [facility], [facility], [facility], [facility], [address] [place], [facility], [facility], [facility]   TTS will continue to seek bed placement.   Baldo DaubJolan Stellarose Cerny MSW, LCSWA CSW Disposition [phone]

## 2017-03-19 NOTE — ED Provider Notes (Signed)
The patient is in the emergency department for evaluation for medical workup given that he is psychiatrically being evaluated and needs placement, his potassium has been low and he has received multiple interventions including oral and intravenous potassium, he will need magnesium and repeat potassium with a recheck in the morning. Anticipate psychiatric placement.  At change of shift - care signed out to Dr. Kyla BalzarinePollina   Chantrice Hagg, Arlys JohnBrian, MD 03/19/17 [phone]

## 2017-03-20 LAB — BASIC METABOLIC PANEL
Anion gap: 5 (ref 5–15)
BUN: <5 mg/dL — ABNORMAL LOW (ref 6–20)
CALCIUM: 8.3 mg/dL — AB (ref 8.9–10.3)
CO2: 33 mmol/L — ABNORMAL HIGH (ref 22–32)
CREATININE: 0.71 mg/dL (ref 0.61–1.24)
Chloride: 97 mmol/L — ABNORMAL LOW (ref 101–111)
Glucose, Bld: 191 mg/dL — ABNORMAL HIGH (ref 65–99)
Potassium: 2.8 mmol/L — ABNORMAL LOW (ref 3.5–5.1)
Sodium: 135 mmol/L (ref 135–145)

## 2017-03-20 LAB — CBG MONITORING, ED: Glucose-Capillary: 174 mg/dL — ABNORMAL HIGH (ref 65–99)

## 2017-03-20 MED ORDER — POTASSIUM CHLORIDE CRYS ER 20 MEQ PO TBCR
40.0000 meq | EXTENDED_RELEASE_TABLET | Freq: Two times a day (BID) | ORAL | Status: AC
  Administered 2017-03-20 – 2017-03-22 (×5): 40 meq via ORAL
  Filled 2017-03-20 (×5): qty 2

## 2017-03-20 MED ORDER — MAGNESIUM CHLORIDE 64 MG PO TBEC
1.0000 | DELAYED_RELEASE_TABLET | Freq: Every day | ORAL | Status: DC
  Administered 2017-03-20 – 2017-03-22 (×3): 64 mg via ORAL
  Filled 2017-03-20 (×4): qty 1

## 2017-03-20 NOTE — ED Notes (Signed)
Dinner was ordered for patient. 

## 2017-03-20 NOTE — Progress Notes (Signed)
Nutrition Brief Note  Consult received for diet education for pt with hypokalemia.  Pt currently in ED awaiting placement. Spoke with RN, gave RN handouts on high potassium foods. Discussed available options as RN does meal ordering.  Spoke briefly with pt who did not make eye contact with RD.  Pt on Regular diet. He reports trouble chewing foods due to poor dentition. Pt states he can eat food on lunch tray at bedside and would be willing to drink OJ and eat bananas.  Please re-consult as needed.     Kendell BaneHeather Oakland Fant RD, LDN, CNSC [phone] Pager [phone] After Hours Pager

## 2017-03-20 NOTE — ED Provider Notes (Signed)
Persistent hypokalemia.  Will ask for dietary consult to increase potassium in his diet.  Increase oral supplement.  Add daily magnesium supplement although it is not low.  Likely overall potassium deficit and will take time to replace.   Linwood DibblesKnapp, Shantavia Jha, MD 03/20/17 [phone]

## 2017-03-20 NOTE — ED Notes (Signed)
Patient was given a snack and drink, A Regular Diet was ordered for Lunch. 

## 2017-03-20 NOTE — ED Notes (Signed)
Dr. Wilkie AyeHorton made aware of K 2.8

## 2017-03-20 NOTE — ED Notes (Signed)
Bryan DickHeather, Dietician, gave RN and pt paperwork re: High Potassium foods. Pt advised it is hard for him to eat certain foods d/t does not have teeth. States he is able to eat food on his lunch tray. Banana x 2 given w/orange juice x 2 to pt.

## 2017-03-21 LAB — BASIC METABOLIC PANEL
Anion gap: 7 (ref 5–15)
CO2: 32 mmol/L (ref 22–32)
Calcium: 8.6 mg/dL — ABNORMAL LOW (ref 8.9–10.3)
Chloride: 99 mmol/L — ABNORMAL LOW (ref 101–111)
Creatinine, Ser: 0.68 mg/dL (ref 0.61–1.24)
GFR calc Af Amer: >60 mL/min (ref >60)
GLUCOSE: 205 mg/dL — AB (ref 65–99)
POTASSIUM: 3.9 mmol/L (ref 3.5–5.1)
Sodium: 138 mmol/L (ref 135–145)

## 2017-03-21 NOTE — ED Notes (Signed)
Dinner tray at bedside. Pt reports he does not need anything else at this time.

## 2017-03-21 NOTE — ED Notes (Signed)
Dinner order placed 

## 2017-03-21 NOTE — ED Notes (Signed)
F/u with service response. Order received.  

## 2017-03-21 NOTE — ED Notes (Addendum)
Pt states "I feel like I've been abandoned". This RN attempted to call pt's son and pt's daughter per pt request with no answer. This RN told pt she would continue to try and reach out to pt family, but if no one responded we would try again tomorrow. This RN offered multiple activities for pt to participate in due to pt being "stircrazy" however pt declined. Engaged in therapeutic conversation with pt about family and grandchildren. Will continue to round on pt.

## 2017-03-21 NOTE — BHH Counselor (Signed)
Reassessment- Pt denies SI/HI and AVH. Pt states he does not know what brought him to the hospital.  TTS will continue to seek placement.   Wolfgang PhoenixBrandi Elsey Holts, [facility] Triage Specialist

## 2017-03-22 ENCOUNTER — Inpatient Hospital Stay
Admission: AD | Admit: 2017-03-22 | Discharge: 2017-03-29 | DRG: 885 | Disposition: A | Discharge status: Home / Self Care | Payer: No Typology Code available for payment source | Source: Intra-hospital | Attending: Psychiatry | Admitting: Psychiatry

## 2017-03-22 DIAGNOSIS — F0150 Vascular dementia without behavioral disturbance: Secondary | ICD-10-CM | POA: Diagnosis present

## 2017-03-22 DIAGNOSIS — Z5900 Homelessness unspecified: Secondary | ICD-10-CM

## 2017-03-22 DIAGNOSIS — E538 Deficiency of other specified B group vitamins: Secondary | ICD-10-CM | POA: Diagnosis present

## 2017-03-22 DIAGNOSIS — F332 Major depressive disorder, recurrent severe without psychotic features: Secondary | ICD-10-CM | POA: Diagnosis present

## 2017-03-22 DIAGNOSIS — Z599 Problem related to housing and economic circumstances, unspecified: Secondary | ICD-10-CM | POA: Diagnosis not present

## 2017-03-22 DIAGNOSIS — E119 Type 2 diabetes mellitus without complications: Secondary | ICD-10-CM | POA: Diagnosis present

## 2017-03-22 DIAGNOSIS — G4700 Insomnia, unspecified: Secondary | ICD-10-CM | POA: Diagnosis present

## 2017-03-22 DIAGNOSIS — Z560 Unemployment, unspecified: Secondary | ICD-10-CM | POA: Diagnosis not present

## 2017-03-22 DIAGNOSIS — R45851 Suicidal ideations: Secondary | ICD-10-CM

## 2017-03-22 DIAGNOSIS — R4189 Other symptoms and signs involving cognitive functions and awareness: Secondary | ICD-10-CM | POA: Diagnosis not present

## 2017-03-22 MED ORDER — MAGNESIUM HYDROXIDE 400 MG/5ML PO SUSP: 30.0000 mL | Freq: Every day | ORAL | Status: DC | PRN

## 2017-03-22 MED ORDER — TRAZODONE HCL 50 MG PO TABS
50.0000 mg | ORAL_TABLET | Freq: Every evening | ORAL | Status: DC | PRN
  Administered 2017-03-23 – 2017-03-25 (×3): 50 mg via ORAL
  Filled 2017-03-22 (×3): qty 1

## 2017-03-22 MED ORDER — ACETAMINOPHEN 325 MG PO TABS: 650.0000 mg | ORAL_TABLET | Freq: Four times a day (QID) | ORAL | Status: DC | PRN

## 2017-03-22 MED ORDER — ALUM & MAG HYDROXIDE-SIMETH 200-200-20 MG/5ML PO SUSP: 30.0000 mL | ORAL | Status: DC | PRN

## 2017-03-22 MED ORDER — ONDANSETRON 4 MG PO TBDP: 4.0000 mg | ORAL_TABLET | Freq: Three times a day (TID) | ORAL | Status: DC | PRN

## 2017-03-22 MED ORDER — HYDROXYZINE HCL 25 MG PO TABS
25.0000 mg | ORAL_TABLET | Freq: Three times a day (TID) | ORAL | Status: DC | PRN
  Administered 2017-03-23 – 2017-03-25 (×3): 25 mg via ORAL
  Filled 2017-03-22 (×3): qty 1

## 2017-03-22 NOTE — BH Assessment (Signed)
Per Feliz Beamravis NP - pt will remain recommended for inpatient treatment. Discussed with Calvin TTS with [facility], who agrees to accept this patient. [facility]. Accepting and Attending is Dr. Ardyth HarpsHernandez. Please fax voluntary consent to [phone]. Call report to [phone]. RN to provide bed assignment. This information was relayed to Saks IncorporatedCynthia RN.

## 2017-03-22 NOTE — ED Notes (Addendum)
Pt signed voluntary admission and consent for treatment and [facility]. [facility] called for transportation.

## 2017-03-22 NOTE — ED Notes (Signed)
Breakfast order sent via email.  

## 2017-03-22 NOTE — ED Notes (Signed)
Pt reports no longer nauseated.

## 2017-03-22 NOTE — ED Notes (Signed)
TTS at bedside. 

## 2017-03-22 NOTE — Tx Team (Signed)
Initial Treatment Plan 03/22/2017 6:07 PM Bryan Farrell Blankenhorn [id number]    PATIENT STRESSORS: Financial difficulties Medication change or noncompliance   PATIENT STRENGTHS: Ability for insight Capable of independent living   PATIENT IDENTIFIED PROBLEMS: Lack of resources  Homeless  OP treatment noncompliance  Depression               DISCHARGE CRITERIA:  Ability to meet basic life and health needs Adequate post-discharge living arrangements Improved stabilization in mood, thinking, and/or behavior Verbal commitment to aftercare and medication compliance  PRELIMINARY DISCHARGE PLAN: Outpatient therapy Placement in alternative living arrangements  PATIENT/FAMILY INVOLVEMENT: This treatment plan has been presented to and reviewed with the patient, Bryan Farrell Balling.  The patient and family have been given the opportunity to ask questions and make suggestions.  Jackelyn PolingSunny  Vester Balthazor, RN 03/22/2017, 6:07 PM

## 2017-03-22 NOTE — Consult Note (Signed)
Telepsych Consultation   Reason for Consult:  Passive SI and MDD Referring Physician:  EDP Patient Identification: Bryan Farrell MRN:  [id number] Principal Diagnosis: MDD (major depressive disorder), recurrent episode, severe ([place]) Diagnosis:   Patient Active Problem List   Diagnosis Date Noted  . MDD (major depressive disorder), recurrent episode, severe ([place]) [F33.2] 03/22/2017    Total Time spent with patient: 30 minutes  Subjective:   Bryan Farrell is a 66 y.o. male patient admitted with passive SI and MDD. Patient only states "I don't know or I guess, when asked questions. He denies medications and psychiatric treatment. He acknowledges homelessness, financial issues, and reports that he was living in his car and now he is not sure where it is and he doesn't have any where to go. He reports 2 daughters in the area, but hasn't spoken to them in a long time.  Objective: Patient appears agitated and angry. He doesn't want to discuss his issues in detail. He has loud and aggressive speech at times. He appears depressed and hopeless with his situation and agreed he needed help and I asked him to sign in voluntarily.  HPI:  Bryan Farrell is an 66 y.o. male. He presents to [facility] with depression. He reports fatigue, isolating self from others, loss of interest in usual pleasures, and worthlessness. Stressors include lack of a support system, homeless, no job, and financial stress. Patient living out of his car for the past several months. Patient stating that he doesn't know where his car and thinks it may have been stolen. Patient asked if he was suicidal and he responds, "Minor". Patient appears to be passively suicidal, no plan, and/or intent. No self mutilating behaviors. No access to firearms. No HI. He is calm and cooperative. No legal issues. No AVH's. No alcohol and drug use reported. Patient admitted to [facility] for Inpatient treatment last year for suicidal ideations. No outpatient  psychiatric providers reported.  Past Psychiatric History: none reported  Risk to Self: Suicidal Ideation: Yes-Currently Present Suicidal Intent: No Is patient at risk for suicide?: No Suicidal Plan?: No Access to Means: No What has been your use of drugs/alcohol within the last 12 months?:  (patient denies ) Risk to Others: Homicidal Ideation: No Thoughts of Harm to Others: No Current Homicidal Intent: No Current Homicidal Plan: No Access to Homicidal Means: No Identified Victim:  (n/a) History of harm to others?: No Assessment of Violence: None Noted Violent Behavior Description:  (patient is calm and cooperative ) Does patient have access to weapons?: No Criminal Charges Pending?: No Does patient have a court date: No Prior Inpatient Therapy: Prior Inpatient Therapy: No Prior Therapy Dates:  (n/a) Prior Therapy Facilty/Provider(s):  (n/a) Reason for Treatment:  (depression and suicide attempt ) Prior Outpatient Therapy: Prior Outpatient Therapy: No Prior Therapy Dates:  (n/a) Prior Therapy Facilty/Provider(s):  (n/a) Reason for Treatment:  (n/a) Does patient have an ACCT team?: No Does patient have Intensive In-House Services?  : No Does patient have [facility] services? : No Does patient have P4CC services?: No  Past Medical History:  Past Medical History:  Diagnosis Date  . Diabetes mellitus without complication [facility])     Past Surgical History:  Procedure Laterality Date  . TONSILLECTOMY     Family History: No family history on file. Family Psychiatric  History: None reported Social History:  History  Alcohol Use No     History  Drug Use No    Social History   Social History  .  Marital status: Single    Spouse name: N/A  . Number of children: N/A  . Years of education: N/A   Social History Main Topics  . Smoking status: Never Smoker  . Smokeless tobacco: Never Used  . Alcohol use No  . Drug use: No  . Sexual activity: Not Asked   Other Topics  Concern  . None   Social History Narrative  . None   Additional Social History:    Allergies:  No Known Allergies  Labs:  Results for orders placed or performed during the hospital encounter of 03/18/17 (from the past 48 hour(s))  Basic metabolic panel     Status: Abnormal   Collection Time: 03/21/17  6:40 AM  Result Value Ref Range   Sodium 138 135 - 145 mmol/L   Potassium 3.9 3.5 - 5.1 mmol/L   Chloride 99 (L) 101 - 111 mmol/L   CO2 32 22 - 32 mmol/L   Glucose, Bld 205 (H) 65 - 99 mg/dL   BUN <5 (L) 6 - 20 mg/dL   Creatinine, Ser 0.68 0.61 - 1.24 mg/dL   Calcium 8.6 (L) 8.9 - 10.3 mg/dL   GFR calc non Af Amer >60 >60 mL/min   GFR calc Af Amer >60 >60 mL/min    Comment: (NOTE) The eGFR has been calculated using the CKD EPI equation. This calculation has not been validated in all clinical situations. eGFR's persistently <60 mL/min signify possible Chronic Kidney Disease.    Anion gap 7 5 - 15    Current Facility-Administered Medications  Medication Dose Route Frequency Provider Last Rate Last Dose  . magnesium chloride (SLOW-MAG) 64 MG SR tablet 64 mg  1 tablet Oral Daily Dorie Rank, MD   64 mg at 03/22/17 0910  . ondansetron (ZOFRAN-ODT) disintegrating tablet 4 mg  4 mg Oral Q8H PRN Fredia Sorrow, MD       No current outpatient prescriptions on file.    Musculoskeletal: Strength & Muscle Tone: within normal limits Gait & Station: normal Patient leans: N/A  Psychiatric Specialty Exam: Physical Exam  Nursing note and vitals reviewed. Constitutional: He is oriented to person, place, and time. He appears well-developed.  Cardiovascular: Normal rate.   Musculoskeletal: Normal range of motion.  Neurological: He is alert and oriented to person, place, and time.    Review of Systems  Constitutional: Negative.   HENT: Negative.   Eyes: Negative.   Respiratory: Negative.   Cardiovascular: Negative.   Gastrointestinal: Negative.   Genitourinary: Negative.    Musculoskeletal: Negative.   Skin: Negative.   Neurological: Negative.   Endo/Heme/Allergies: Negative.     Blood pressure (!) 145/85, pulse 70, temperature 99 F (37.2 C), temperature source Oral, resp. rate 16, SpO2 100 %.There is no height or weight on file to calculate BMI.  General Appearance: Casual  Eye Contact:  Fair  Speech:  Clear and Coherent and Normal Rate  Volume:  Increased  Mood:  Angry and Irritable  Affect:  Flat  Thought Process:  Coherent and Descriptions of Associations: Intact  Orientation:  Full (Time, Place, and Person)  Thought Content:  WDL  Suicidal Thoughts:  States "i don't guess so."  Homicidal Thoughts:  No  Memory:  Immediate;   Good Recent;   Good  Judgement:  Fair  Insight:  Fair  Psychomotor Activity:  Normal  Concentration:  Concentration: Good and Attention Span: Good  Recall:  Good  Fund of Knowledge:  Good  Language:  Good  Akathisia:  No  Handed:  Right  AIMS (if indicated):     Assets:  Desire for Improvement  ADL's:  Intact  Cognition:  WNL  Sleep:        Treatment Plan Summary: Inpatient treatment for agitation and MDD Assistance with outpatient resources upon diuscharge  Disposition: Recommend psychiatric Inpatient admission when medically cleared.  [place], FNP 03/22/2017 1:03 PM

## 2017-03-22 NOTE — BH Assessment (Signed)
Patient has been accepted to [facility].  Accepting physician is Dr. Michel SanteeHernadez.  Attending Physician will be Dr. Jennet MaduroPucilowska.  Patient has been assigned to room 309, by [facility] Charge Nurse DrydenGwen.  Call report to [phone].  Representative/Transfer Coordinator is Warden/rangerCalvin Patient pre-admitted by [facility] Patient Access Arvella Nigh(Janellie)   [facility] Staff Lillia Abed([place], [facility] & Marni GriffonJolan W. Social Worker) aware of acceptance.

## 2017-03-22 NOTE — Discharge Planning (Signed)
Discussed at [employer] Meetings, dates discussed and feedback:  8/9- ?[facility] placement

## 2017-03-22 NOTE — ED Notes (Signed)
Meal tray given 

## 2017-03-22 NOTE — ED Notes (Signed)
PA from TTS evaluation reports pt will need to stay to continued to be monitored r/t pt "seeming very angry and upset"

## 2017-03-22 NOTE — ED Notes (Signed)
Regular Diet has been Ordered for Dinner. 

## 2017-03-22 NOTE — Progress Notes (Signed)
After QC meetings this morning, Dr. Mervyn SkeetersA recommended that we [facility] for [facility].     Claude MangesKierra S. Chapel Silverthorn, MSW, LCSW-A Emergency Department Clinical Social Worker [phone]

## 2017-03-22 NOTE — Progress Notes (Signed)
66 year old male admitted to unit. Alert and oriented x4. Reports having passive SI, but currently denies at present. Patient verbally contracts for safety. Denies current HI, AVH. Patient reports that current stressors are the fact that he is homeless living out of his car, lack of family contact and uncertainty of his future. Patient with sad affect, c/o R) wrist pain, (encouraged patient to rest) the issue is related to a recent fall several days ago at the hospital. Oriented patient to room and unit. Skin and contraband search completed and witnessed by [facility]. MHT.  Skin warm, dry and intact. No contraband found.  Admission assessment completed, fluid and nutrition offered. Patient remains safe on the unit with q 15 minute checks.

## 2017-03-23 ENCOUNTER — Encounter: Payer: Self-pay | Admitting: Psychiatry

## 2017-03-23 ENCOUNTER — Inpatient Hospital Stay: Payer: No Typology Code available for payment source

## 2017-03-23 DIAGNOSIS — Z5900 Homelessness unspecified: Secondary | ICD-10-CM

## 2017-03-23 DIAGNOSIS — E119 Type 2 diabetes mellitus without complications: Secondary | ICD-10-CM

## 2017-03-23 DIAGNOSIS — F0150 Vascular dementia without behavioral disturbance: Secondary | ICD-10-CM | POA: Diagnosis present

## 2017-03-23 DIAGNOSIS — R45851 Suicidal ideations: Secondary | ICD-10-CM

## 2017-03-23 MED ORDER — GLUCERNA SHAKE PO LIQD
237.0000 mL | Freq: Three times a day (TID) | ORAL | Status: DC
  Administered 2017-03-23 – 2017-03-29 (×18): 237 mL via ORAL

## 2017-03-23 MED ORDER — FLUOXETINE HCL 20 MG PO CAPS
20.0000 mg | ORAL_CAPSULE | Freq: Every day | ORAL | Status: DC
  Administered 2017-03-23 – 2017-03-28 (×6): 20 mg via ORAL
  Filled 2017-03-23 (×6): qty 1

## 2017-03-23 NOTE — Progress Notes (Signed)
Recreation Therapy Notes  Date: 08.10.18 Time: 1:00 pm Location: Craft Room  Group Topic: Social Skills  Goal Area(s) Addresses:  Patient will effectively work with peer towards shared goal. Patient will identify skills used to make activity successful. Patient will identify how skills used during activity can be used to reach post d/c. goals.  Behavioral Response: Attentive, Interactive  Intervention: Life Boat  Activity: Patients were given a scenario that we were in [place] and decided to take a boat to explore. Patients had a list of 16 people (Edwyna ReadyBeyonce, nurse, college student, etc.) who were on the boat with us. While we were exploring, the boat sprung a leak and was going to sink. There were two life boats attached to the big boat. One is bigger and faster. It holds everyone in the room plus 8 people and the other holds the remaining 8 people on the list. Patients were put in charge of putting people on the life boats.  Education: LRT educated patients on healthy support systems.  Education Outcome: Acknowledges education/In group clarification offered   Clinical Observations/Feedback: Patient worked with peers towards shared goal. Patient contributed to group discussion by stating what traits guided his decision making skills.  Jacquelynn CreeGreene,Kirston Luty M, LRT/CTRS 03/23/2017 1:54 PM

## 2017-03-23 NOTE — Plan of Care (Signed)
Problem: Safety: Goal: Ability to identify and utilize support systems that promote safety will improve Outcome: Not Progressing Pt avoiding to discuss his feelings

## 2017-03-23 NOTE — Plan of Care (Signed)
Problem: Self-Concept: Goal: Ability to disclose and discuss suicidal ideas will improve Outcome: Not Progressing Alert and oriented but refusing to talk to staff

## 2017-03-23 NOTE — Progress Notes (Signed)
Patient remained isolative in room, refusing to talk to staff. Flat and depressed. Patient did not participate in evening activities. Currently in bed sleeping. Staff continue to monitor for safety and other needs.

## 2017-03-23 NOTE — [facility] Group Notes (Signed)
[facility] LCSW Group Therapy Note  Date/Time: 03/23/17, 0930  Type of Therapy and Topic:  Group Therapy:  Feelings around Relapse and Recovery  Participation Level:  Active   Mood: pleasant  Description of Group:    Patients in this group will discuss emotions they experience before and after a relapse. They will process how experiencing these feelings, or avoidance of experiencing them, relates to having a relapse. Facilitator will guide patients to explore emotions they have related to recovery. Patients will be encouraged to process which emotions are more powerful. They will be guided to discuss the emotional reaction significant others in their lives may have to patients' relapse or recovery. Patients will be assisted in exploring ways to respond to the emotions of others without this contributing to a relapse.  Therapeutic Goals: 1. Patient will identify two or more emotions that lead to relapse for them:  2. Patient will identify two emotions that result when they relapse:  3. Patient will identify two emotions related to recovery:  4. Patient will demonstrate ability to communicate their needs through discussion and/or role plays.   Summary of Patient Progress: Pt reports minimal experience with problem substance use but did participate in a discussion regarding managing difficult emotions through substance use along with alternatives to this.  Pt was appropriate throughout.  Pt reports being a little confused with regards to being oriented to the unit and CSW spent a few minutes after group showing him around a little more.     Therapeutic Modalities:   Cognitive Behavioral Therapy Solution-Focused Therapy Assertiveness Training Relapse Prevention Therapy  Daleen SquibbGreg Stephanne Greeley, LCSW

## 2017-03-23 NOTE — H&P (Signed)
Psychiatric Admission Assessment Adult  Patient Identification: Bryan Farrell MRN:  [id number] Date of Evaluation:  03/23/2017 Chief Complaint:  Depression Principal Diagnosis: Major depressive disorder, recurrent severe without psychotic features (HCC) Diagnosis:   Patient Active Problem List   Diagnosis Date Noted  . Cognitive decline [R41.89] 03/23/2017  . Suicidal ideation [R45.851] 03/23/2017  . Homelessness [Z59.0] 03/23/2017  . Diabetes (HCC) [E11.9] 03/23/2017  . Major depressive disorder, recurrent severe without psychotic features (HCC) [F33.2] 03/22/2017   History of Present Illness:   Identifying data. Bryan Farrell is a 66 year old male with a history of depression and cognitive decline.  Chief complaint. "I said I was suicidal."  History of present illness. Information was obtained from the patient and the chart. The patient himself is that he is not able to provide much information. He mostly answers or I don't remember. The patient showed up at [facility] Emergency room on August 4 complaining of headache and dizziness. It was felt that the patient was hungry. His potassium was low and it was replenished. At that time the patient was not evaluated by mental health services. He was reportedly said [facility] for treatment. She retired in 24 hours to the emergency room from [place] this time suicidal. The patient himself is very unsure. He thinks that he could have said something about being suicidal out of frustration. He does not remember what he said is to. Apparently the patient has been staying at the [facility] in [place] for a period of time until his money from inheritance ran out. He was evicted from the hotel and started living in his car that was parked in a motel parking lot. He does not remember how long ago this happened. He has been going to nearby soup kitchen. He has been trying to reach his children, the son and the daughter, who lives in [place] area with no  success. He feels abandoned and very pessimistic. He became increasingly weak as he lost weight and while at [facility] emergency room he fell in the shower. X-ray of his right wrist shows no fracture or dislocation by a brace was lately on the wrist. The patient had difficulties getting into a chair and getting up due to weakness. He seems to have cognitive problems and very poor recent as well as remote memory and has not been able to answer most of our questions. He reports many symptoms of depression with poor sleep, decreased appetite, weight loss, anhedonia, guilt and hopelessness worthlessness, poor energy and concentration, social isolation, crying spells and suicidal thinking without plan. He denies psychotic symptoms or symptoms suggestive of bipolar mania. He does not report heightened anxiety. There is no alcohol or drug problems.   Past psychiatric history. There is one suicide attempt by hanging in 2011 and another psychiatric admission last year for depression and suicidal ideation. He has not follow up with outpatient provider.   Family psychiatric history. Unknown.  Social history. As above. He has not been able to provide much information due to cognitive decline.  Total Time spent with patient: 1 hour  Is the patient at risk to self? Yes.    Has the patient been a risk to self in the past 6 months? No.  Has the patient been a risk to self within the distant past? Yes.    Is the patient a risk to others? No.  Has the patient been a risk to others in the past 6 months? No.  Has the patient been a  risk to others within the distant past? No.   Prior Inpatient Therapy:   Prior Outpatient Therapy:    Alcohol Screening: 1. How often do you have a drink containing alcohol?: Never 9. Have you or someone else been injured as a result of your drinking?: No 10. Has a relative or friend or a doctor or another health worker been concerned about your drinking or suggested you cut down?:  No Alcohol Use Disorder Identification Test Final Score (AUDIT): 0 Brief Intervention: AUDIT score less than 7 or less-screening does not suggest unhealthy drinking-brief intervention not indicated Substance Abuse History in the last 12 months:  No. Consequences of Substance Abuse: NA Previous Psychotropic Medications: Yes  Psychological Evaluations: Yes  Past Medical History:  Past Medical History:  Diagnosis Date  . Diabetes mellitus without complication [facility])     Past Surgical History:  Procedure Laterality Date  . TONSILLECTOMY     Family History: History reviewed. No pertinent family history.  Tobacco Screening: Have you used any form of tobacco in the last 30 days? (Cigarettes, Smokeless Tobacco, Cigars, and/or Pipes): No Social History:  History  Alcohol Use No     History  Drug Use No    Additional Social History:                           Allergies:  No Known Allergies Lab Results: No results found for this or any previous visit (from the past 48 hour(s)).  Blood Alcohol level:  Lab Results  Component Value Date   ETH <5 03/18/2017   ETH <11 04/20/2014    Metabolic Disorder Labs:  No results found for: HGBA1C, MPG No results found for: PROLACTIN No results found for: CHOL, TRIG, HDL, CHOLHDL, VLDL, LDLCALC  Current Medications: Current Facility-Administered Medications  Medication Dose Route Frequency Provider Last Rate Last Dose  . acetaminophen (TYLENOL) tablet 650 mg  650 mg Oral Q6H PRN Jimmy Footman, MD      . alum & mag hydroxide-simeth (MAALOX/MYLANTA) 200-200-20 MG/5ML suspension 30 mL  30 mL Oral Q4H PRN Jimmy Footman, MD      . hydrOXYzine (ATARAX/VISTARIL) tablet 25 mg  25 mg Oral TID PRN Jimmy Footman, MD      . magnesium hydroxide (MILK OF MAGNESIA) suspension 30 mL  30 mL Oral Daily PRN Jimmy Footman, MD      . traZODone (DESYREL) tablet 50 mg  50 mg Oral QHS PRN  Jimmy Footman, MD       PTA Medications: No prescriptions prior to admission.    Musculoskeletal: Strength & Muscle Tone: within normal limits and decreased Gait & Station: unsteady Patient leans: N/A  Psychiatric Specialty Exam: Physical Exam  Nursing note and vitals reviewed. Constitutional: He is oriented to person, place, and time. He appears well-developed and well-nourished.  HENT:  Head: Normocephalic and atraumatic.  Eyes: Pupils are equal, round, and reactive to light. Conjunctivae and EOM are normal.  Neck: Normal range of motion. Neck supple.  Cardiovascular: Normal rate, regular rhythm and normal heart sounds.   Respiratory: Effort normal and breath sounds normal.  GI: Soft. Bowel sounds are normal.  Musculoskeletal: Normal range of motion.  Brace on the left wrist from fall in the ER. No fracture reported.  Neurological: He is alert and oriented to person, place, and time.  Skin: Skin is warm and dry.  Psychiatric: His affect is blunt and inappropriate. His speech is delayed. He is slowed and withdrawn.  Cognition and memory are impaired. He expresses impulsivity. He expresses suicidal ideation. He exhibits abnormal recent memory and abnormal remote memory.    Review of Systems  Constitutional: Positive for malaise/fatigue and weight loss.  Musculoskeletal: Positive for joint pain.  Psychiatric/Behavioral: Positive for depression, memory loss and suicidal ideas.  All other systems reviewed and are negative.   Blood pressure 131/84, pulse 80, temperature 98.5 F (36.9 C), temperature source Oral, resp. rate 18, height 6' (1.829 m), weight 76.7 kg (169 lb), SpO2 100 %.Body mass index is 22.92 kg/m.  See SRA.                                                  Sleep:  Number of Hours: 4.5    Treatment Plan Summary: Daily contact with patient to assess and evaluate symptoms and progress in treatment and Medication management   Mr.  [name] is a 66 year old male with a history of depression admitted for suicidal ideation in the context of severe social stressors, lack of support, and cognitive decline.  1. Suicidal ideation. The patient denies feeling suicidal today. He is able to contract for safety in the hospital.  2. Mood. We will start Prozac for depression.  3. Insomnia. Trazodone is available.  4. Diabetes. We ordered a diet and CABG testing.  5. Metabolic syndrome monitoring. Lipid panel, TSH, and hemoglobin A1c are pending.  6. EKG. QTC 501.  7. Cognitive decline. We will order a head CT scan, MOCA, vitamin B12 level, RPR and HIV testing.  8. Falls. The patient fell in the shower in the ER. Wrist X-ray negative. He wears a brace. PT assessment.  9. Weight loss. We will offer Glucerna.  10. Social. So far there is nobody could reach his family. He believes that his car with all his possessions could have been stolen or impounded.  11. Disposition. To be established. The patient has no income, seems demented, unable to care for himself and with no social support.   Observation Level/Precautions:  15 minute checks  Laboratory:  CBC Chemistry Profile UDS UA Vitamin B-12  Psychotherapy:    Medications:    Consultations:    Discharge Concerns:    Estimated LOS:  Other:     Physician Treatment Plan for Primary Diagnosis: Major depressive disorder, recurrent severe without psychotic features (HCC) Long Term Goal(s): Improvement in symptoms so as ready for discharge  Short Term Goals: Ability to identify changes in lifestyle to reduce recurrence of condition will improve, Ability to verbalize feelings will improve, Ability to disclose and discuss suicidal ideas, Ability to demonstrate self-control will improve, Ability to identify and develop effective coping behaviors will improve, Ability to maintain clinical measurements within normal limits will improve, Compliance with prescribed medications will  improve and Ability to identify triggers associated with substance abuse/mental health issues will improve  Physician Treatment Plan for Secondary Diagnosis: Principal Problem:   Major depressive disorder, recurrent severe without psychotic features (HCC) Active Problems:   Cognitive decline   Suicidal ideation   Homelessness   Diabetes (HCC)  Long Term Goal(s): NA  Short Term Goals: NA  I certify that inpatient services furnished can reasonably be expected to improve the patient's condition.    Kristine LineaJolanta Edwin Baines, MD 8/10/201812:59 PM

## 2017-03-23 NOTE — Plan of Care (Signed)
Problem: Safety: Goal: Ability to identify and utilize support systems that promote safety will improve Outcome: Progressing Patient verbalizes he will talk to staff if he feel unsafe. Will continue to monitor patient.

## 2017-03-23 NOTE — Progress Notes (Signed)
Recreation Therapy Notes  INPATIENT RECREATION THERAPY ASSESSMENT  Patient Details Name: Bryan Farrell MRN: [id number] DOB: [birth year] Today's Date: 03/23/2017  Patient Stressors: Family, Work, Other (Comment) (Feels that his children do not care about him; lost his job and cannot find a new one; homeless)  Coping Skills:   Isolate, Music, Sports, Other (Comment) (Deep Breathing, moving on)  Personal Challenges: Communication, Expressing Yourself, Relationships, Social Interaction  Leisure Interests (2+):  Individual - TV, Sports - Other (Comment) (Volleyball)  Awareness of Community Resources:  No  Community Resources:     Current Use:    If no, Barriers?:    Patient Strengths:  Intelligent  Patient Identified Areas of Improvement:  Interpersonal skills  Current Recreation Participation:  Not much  Patient Goal for Hospitalization:  Does not have a goal yet  [place]  [place] Residence:  [place]   Current [place] (including self-harm):  No  Current HI:  No  Consent to Intern Participation: N/A   Jacquelynn CreeGreene,[place] Madia M, LRT/CTRS 03/23/2017, 4:21 PM

## 2017-03-23 NOTE — Tx Team (Signed)
Interdisciplinary Treatment and Diagnostic Plan Update  03/23/2017 Time of Session: 10:30am Bryan Farrell MRN: [id number]  Principal Diagnosis: Major depressive disorder, recurrent severe without psychotic features [facility])  Secondary Diagnoses: Principal Problem:   Major depressive disorder, recurrent severe without psychotic features ([place]) Active Problems:   Cognitive decline   Suicidal ideation   Homelessness   Diabetes ([place])   Current Medications:  Current Facility-Administered Medications  Medication Dose Route Frequency Provider Last Rate Last Dose  . acetaminophen (TYLENOL) tablet 650 mg  650 mg Oral Q6H PRN Hildred Priest, MD      . alum & mag hydroxide-simeth (MAALOX/MYLANTA) 200-200-20 MG/5ML suspension 30 mL  30 mL Oral Q4H PRN Hildred Priest, MD      . feeding supplement (GLUCERNA SHAKE) (GLUCERNA SHAKE) liquid 237 mL  237 mL Oral TID BM Pucilowska, Jolanta B, MD      . FLUoxetine (PROZAC) capsule 20 mg  20 mg Oral QHS Pucilowska, Jolanta B, MD      . hydrOXYzine (ATARAX/VISTARIL) tablet 25 mg  25 mg Oral TID PRN Hildred Priest, MD      . magnesium hydroxide (MILK OF MAGNESIA) suspension 30 mL  30 mL Oral Daily PRN Hildred Priest, MD      . traZODone (DESYREL) tablet 50 mg  50 mg Oral QHS PRN Hildred Priest, MD       PTA Medications: No prescriptions prior to admission.    Patient Stressors: Financial difficulties Medication change or noncompliance  Patient Strengths: Ability for insight Capable of independent living  Treatment Modalities: Medication Management, Group therapy, Case management,  1 to 1 session with clinician, Psychoeducation, Recreational therapy.   Physician Treatment Plan for Primary Diagnosis: Major depressive disorder, recurrent severe without psychotic features ([place]) Long Term Goal(s): Improvement in symptoms so as ready for discharge NA   Short Term Goals: Ability to identify  changes in lifestyle to reduce recurrence of condition will improve Ability to verbalize feelings will improve Ability to disclose and discuss suicidal ideas Ability to demonstrate self-control will improve Ability to identify and develop effective coping behaviors will improve Ability to maintain clinical measurements within normal limits will improve Compliance with prescribed medications will improve Ability to identify triggers associated with substance abuse/mental health issues will improve NA  Medication Management: Evaluate patient's response, side effects, and tolerance of medication regimen.  Therapeutic Interventions: 1 to 1 sessions, Unit Group sessions and Medication administration.  Evaluation of Outcomes: Not Met  Physician Treatment Plan for Secondary Diagnosis: Principal Problem:   Major depressive disorder, recurrent severe without psychotic features ([place]) Active Problems:   Cognitive decline   Suicidal ideation   Homelessness   Diabetes ([place])  Long Term Goal(s): Improvement in symptoms so as ready for discharge NA   Short Term Goals: Ability to identify changes in lifestyle to reduce recurrence of condition will improve Ability to verbalize feelings will improve Ability to disclose and discuss suicidal ideas Ability to demonstrate self-control will improve Ability to identify and develop effective coping behaviors will improve Ability to maintain clinical measurements within normal limits will improve Compliance with prescribed medications will improve Ability to identify triggers associated with substance abuse/mental health issues will improve NA     Medication Management: Evaluate patient's response, side effects, and tolerance of medication regimen.  Therapeutic Interventions: 1 to 1 sessions, Unit Group sessions and Medication administration.  Evaluation of Outcomes: Not Met   RN Treatment Plan for Primary Diagnosis: Major depressive disorder,  recurrent severe without psychotic features ([place]) Long  Term Goal(s): Knowledge of disease and therapeutic regimen to maintain health will improve  Short Term Goals: Ability to disclose and discuss suicidal ideas, Ability to identify and develop effective coping behaviors will improve and Compliance with prescribed medications will improve  Medication Management: RN will administer medications as ordered by provider, will assess and evaluate patient's response and provide education to patient for prescribed medication. RN will report any adverse and/or side effects to prescribing provider.  Therapeutic Interventions: 1 on 1 counseling sessions, Psychoeducation, Medication administration, Evaluate responses to treatment, Monitor vital signs and CBGs as ordered, Perform/monitor CIWA, COWS, AIMS and Fall Risk screenings as ordered, Perform wound care treatments as ordered.  Evaluation of Outcomes: Not Met   LCSW Treatment Plan for Primary Diagnosis: Major depressive disorder, recurrent severe without psychotic features ([place]) Long Term Goal(s): Safe transition to appropriate next level of care at discharge, Engage patient in therapeutic group addressing interpersonal concerns.  Short Term Goals: Engage patient in aftercare planning with referrals and resources, Increase social support and Increase skills for wellness and recovery  Therapeutic Interventions: Assess for all discharge needs, 1 to 1 time with Social worker, Explore available resources and support systems, Assess for adequacy in community support network, Educate family and significant other(s) on suicide prevention, Complete Psychosocial Assessment, Interpersonal group therapy.  Evaluation of Outcomes: Not Met    Recreational Therapy Treatment Plan for Primary Diagnosis: Major depressive disorder, recurrent severe without psychotic features ([place]) Long Term Goal(s): Patient will participate in recreation therapy treatment in at least 2  group sessions without prompting from LRT  Short Term Goals: Increase communication skills, Increase self-expression skills  Treatment Modalities: Group Therapy and Individual Treatment Sessions  Therapeutic Interventions: Psychoeducation  Evaluation of Outcomes: Progressing   Progress in Treatment: Attending groups: Yes. Participating in groups: Yes. Taking medication as prescribed: Yes. Toleration medication: Yes. Family/Significant other contact made: No, will contact:  identified support when consent is obtained from patient.  Patient understands diagnosis: Yes. Discussing patient identified problems/goals with staff: Yes. Medical problems stabilized or resolved: No. Denies suicidal/homicidal ideation: Yes. Issues/concerns per patient self-inventory: No. Other: n/a  New problem(s) identified: None identified   New Short Term/Long Term Goal(s): "I'm not really sure why I'm here". Patient was unable to state a clear goal. Patient is very confused and has difficulty remembering things.   Reason for Continuation of Hospitalization: Medication stabilization Other; describe confusion and memory issues.   Estimated Length of Stay: 7 days  Attendees: Patient: Lear Ng. Claybon Jabs MSW, [place] 03/23/2017 2:57 PM  Physician: Dr. Orson Slick, MD 03/23/2017 2:57 PM  Nursing: Ardeen Fillers, RN 03/23/2017 2:57 PM  RN Care Manager: 03/23/2017 2:57 PM  Social Worker: Lear Ng. Claybon Jabs MSW, [place] 03/23/2017 2:57 PM  Recreational Therapist: Leonette Monarch, LRT/CTRS 03/23/2017 2:57 PM  Other:  03/23/2017 2:57 PM  Other:  03/23/2017 2:57 PM  Other: 03/23/2017 2:57 PM    Scribe for Treatment Team: Jolaine Click, [place] 03/23/2017 3:03 PM

## 2017-03-23 NOTE — Plan of Care (Signed)
Problem: Coping: Goal: Ability to cope will improve Outcome: Not Progressing Pt has no motivation

## 2017-03-23 NOTE — Progress Notes (Signed)
Pt refused bs checks at this time

## 2017-03-23 NOTE — BHH Suicide Risk Assessment (Signed)
[facility] Admission Suicide Risk Assessment   Nursing information obtained from:    Demographic factors:    Current Mental Status:    Loss Factors:    Historical Factors:    Risk Reduction Factors:     Total Time spent with patient: 1 hour Principal Problem: Major depressive disorder, recurrent severe without psychotic features (HCC) Diagnosis:   Patient Active Problem List   Diagnosis Date Noted  . Cognitive decline [R41.89] 03/23/2017  . Suicidal ideation [R45.851] 03/23/2017  . Homelessness [Z59.0] 03/23/2017  . Diabetes (HCC) [E11.9] 03/23/2017  . Major depressive disorder, recurrent severe without psychotic features (HCC) [F33.2] 03/22/2017   Subjective Data: suicidal ideation.  Continued Clinical Symptoms:  Alcohol Use Disorder Identification Test Final Score (AUDIT): 0 The "Alcohol Use Disorders Identification Test", Guidelines for Use in Primary Care, Second Edition.  World Science writer [facility]). Score between 0-7:  no or low risk or alcohol related problems. Score between 8-15:  moderate risk of alcohol related problems. Score between 16-19:  high risk of alcohol related problems. Score 20 or above:  warrants further diagnostic evaluation for alcohol dependence and treatment.   CLINICAL FACTORS:   Depression:   Severe Unstable or Poor Therapeutic Relationship Previous Psychiatric Diagnoses and Treatments   Musculoskeletal: Strength & Muscle Tone: decreased Gait & Station: unsteady Patient leans: N/A  Psychiatric Specialty Exam: Physical Exam  Nursing note and vitals reviewed. Psychiatric: His affect is blunt and inappropriate. His speech is delayed. He is slowed and withdrawn. Cognition and memory are impaired. He expresses impulsivity. He expresses suicidal ideation. He exhibits abnormal recent memory and abnormal remote memory.    Review of Systems  Constitutional: Positive for weight loss.  Musculoskeletal: Positive for joint pain.  Neurological: Positive  for weakness.  Psychiatric/Behavioral: Positive for depression, memory loss and suicidal ideas.  All other systems reviewed and are negative.   Blood pressure 131/84, pulse 80, temperature 98.5 F (36.9 C), temperature source Oral, resp. rate 18, height 6' (1.829 m), weight 76.7 kg (169 lb), SpO2 100 %.Body mass index is 22.92 kg/m.  General Appearance: Disheveled  Eye Contact:  Fair  Speech:  Slow  Volume:  Decreased  Mood:  Depressed and Irritable  Affect:  Blunt  Thought Process:  Goal Directed and Descriptions of Associations: Tangential  Orientation:  Full (Time, Place, and Person)  Thought Content:  WDL  Suicidal Thoughts:  Yes.  without intent/plan  Homicidal Thoughts:  No  Memory:  Immediate;   Poor Recent;   Poor Remote;   Poor  Judgement:  Poor  Insight:  Lacking  Psychomotor Activity:  Psychomotor Retardation  Concentration:  Concentration: Poor and Attention Span: Poor  Recall:  Poor  Fund of Knowledge:  Fair  Language:  Fair  Akathisia:  No  Handed:  Right  AIMS (if indicated):     Assets:  Communication Skills Resilience  ADL's:  Impaired  Cognition:  Impaired,  Moderate  Sleep:  Number of Hours: 4.5      COGNITIVE FEATURES THAT CONTRIBUTE TO RISK:  None    SUICIDE RISK:   Severe:  Frequent, intense, and enduring suicidal ideation, specific plan, no subjective intent, but some objective markers of intent (i.e., choice of lethal method), the method is accessible, some limited preparatory behavior, evidence of impaired self-control, severe dysphoria/symptomatology, multiple risk factors present, and few if any protective factors, particularly a lack of social support.  PLAN OF CARE: hospital admission, medication management, social assessment, discharge planning.  Bryan Farrell is a 66 year old  male with a history of depression admitted for suicidal ideation in the context of severe social stressors, lack of support, and cognitive decline.  1. Suicidal  ideation. The patient denies feeling suicidal today. He is able to contract for safety in the hospital.  2. Mood. We will start Prozac for depression.  3. Insomnia. Trazodone is available.  4. Diabetes. We ordered a diet and CABG testing.  5. Metabolic syndrome monitoring. Lipid panel, TSH, and hemoglobin A1c are pending.  6. EKG. QTC 501.  7. Cognitive decline. We will order a head CT scan, vitamin B12 level, RPR and HIV testing.  8. Social. So far there is nobody could reach his family. He believes that his car with all his possessions could have been stolen or impounded.  9. Disposition. To be established. The patient has no income, seems demented, unable to care for himself and with no social support.    I certify that inpatient services furnished can reasonably be expected to improve the patient's condition.   Bryan LineaJolanta Tennille Montelongo, MD 03/23/2017, 12:50 PM

## 2017-03-23 NOTE — BHH Group Notes (Signed)
BHH Group Notes:  (Nursing/MHT/Case Management/Adjunct)  Date:  03/23/2017  Time:  7:28 AM  Type of Therapy:  Psychoeducational Skills  Participation Level:  Active  Participation Quality:  Appropriate, Attentive and Sharing  Affect:  Appropriate and Depressed  Cognitive:  Appropriate  Insight:  Appropriate  Engagement in Group:  Engaged  Modes of Intervention:  Discussion, Socialization and Support  Summary of Progress/Problems:  Bryan MilroyLaquanda Y Teran Farrell 03/23/2017, 7:28 AM

## 2017-03-23 NOTE — Progress Notes (Signed)
Pt is in irritable mood seems confused at times. Pt commits to safety on unit. Will continue to monitor. Pt educated on importance compliance of treatment with all medications and groups.

## 2017-03-23 NOTE — Plan of Care (Signed)
Problem: Health Behavior/Discharge Planning: Goal: Compliance with therapeutic regimen will improve Outcome: Not Progressing Pt remained isolative in room, refusing to talk to staff. Guarded and irritable

## 2017-03-24 DIAGNOSIS — R634 Abnormal weight loss: Secondary | ICD-10-CM

## 2017-03-24 DIAGNOSIS — F332 Major depressive disorder, recurrent severe without psychotic features: Principal | ICD-10-CM

## 2017-03-24 DIAGNOSIS — E119 Type 2 diabetes mellitus without complications: Secondary | ICD-10-CM

## 2017-03-24 DIAGNOSIS — G4700 Insomnia, unspecified: Secondary | ICD-10-CM

## 2017-03-24 DIAGNOSIS — R4189 Other symptoms and signs involving cognitive functions and awareness: Secondary | ICD-10-CM

## 2017-03-24 DIAGNOSIS — Z590 Homelessness: Secondary | ICD-10-CM

## 2017-03-24 DIAGNOSIS — R45851 Suicidal ideations: Secondary | ICD-10-CM

## 2017-03-24 DIAGNOSIS — F39 Unspecified mood [affective] disorder: Secondary | ICD-10-CM

## 2017-03-24 LAB — GLUCOSE, CAPILLARY
GLUCOSE-CAPILLARY: 202 mg/dL — AB (ref 65–99)
Glucose-Capillary: 234 mg/dL — ABNORMAL HIGH (ref 65–99)

## 2017-03-24 LAB — LIPID PANEL
CHOL/HDL RATIO: 5.9 ratio
Cholesterol: 200 mg/dL (ref 0–200)
HDL: 34 mg/dL — AB (ref >40)
LDL CALC: 135 mg/dL — AB (ref 0–99)
Triglycerides: 156 mg/dL — ABNORMAL HIGH (ref <150)
VLDL: 31 mg/dL (ref 0–40)

## 2017-03-24 LAB — HIV ANTIBODY (ROUTINE TESTING W REFLEX): HIV SCREEN 4TH GENERATION: NONREACTIVE

## 2017-03-24 LAB — VITAMIN B12: Vitamin B-12: 188 pg/mL (ref 180–914)

## 2017-03-24 LAB — TSH: TSH: 1.447 u[IU]/mL (ref 0.350–4.500)

## 2017-03-24 NOTE — BHH Group Notes (Signed)
BHH Group Notes:  (Nursing/MHT/Case Management/Adjunct)  Date:  03/24/2017  Time:  3:30 AM  Type of Therapy:  Psychoeducational Skills  Participation Level:  Did Not Attend   Summary of Progress/Problems:  Chancy MilroyLaquanda Y Sagar Tengan 03/24/2017, 3:30 AM

## 2017-03-24 NOTE — BHH Counselor (Signed)
Adult Comprehensive Assessment  Patient ID: Bryan Farrell, male   DOB: 1951-04-02, 66 y.o.   MRN: [id number]  Information Source: Information source: Patient  Current Stressors:  Educational / Learning stressors: n/a Employment / Job issues: n/a Family Relationships: Pt is estranged from family.  Financial / Lack of resources (include bankruptcy): Pt has no source of income.  Housing / Lack of housing: Pt is homeless and had been living in his car.  Physical health (include injuries & life threatening diseases): Diabetes Social relationships: n/a Substance abuse: Patient denies although states he would drank alcohol heavily in college.  Bereavement / Loss: n/a  Living/Environment/Situation:  Living Arrangements: Alone (Patient is homeless.) Living conditions (as described by patient or guardian): Pt has been living in his car How long has patient lived in current situation?: For a few months  Family History:  Marital status: Divorced Divorced, when?: Mid 90s What types of issues is patient dealing with in the relationship?: Patient states he did not get a long with is ex-wife Additional relationship information: Patient states they were married about 15 years.  Are you sexually active?: No What is your sexual orientation?: heterosexual Has your sexual activity been affected by drugs, alcohol, medication, or emotional stress?: n/a Does patient have children?: Yes How many children?: 2 How is patient's relationship with their children?: 1 son and 1 daughter. Pt states he has no contact with them currently but the lasts he can remember, they were living in [place].   Childhood History:  By whom was/is the patient raised?: Both parents Additional childhood history information: n/a Description of patient's relationship with caregiver when they were a child: Pt states he had a good relationship with his parents growing up.  Patient's description of current relationship with  people who raised him/her: Both parents are deceased.  How were you disciplined when you got in trouble as a child/adolescent?: n/a Does patient have siblings?: Yes Number of Siblings: 2 Description of patient's current relationship with siblings: 1 sister is deceased. Other sister lives in [place].  Did patient suffer any verbal/emotional/physical/sexual abuse as a child?: No Did patient suffer from severe childhood neglect?: No Has patient ever been sexually abused/assaulted/raped as an adolescent or adult?: No Was the patient ever a victim of a crime or a disaster?: No Witnessed domestic violence?: No Has patient been effected by domestic violence as an adult?: No  Education:  Highest grade of school patient has completed: [employer] Currently a Consulting civil engineerstudent?: No Name of school: [employer] Learning disability?: No  Employment/Work Situation:   Employment situation: Unemployed Patient's job has been impacted by current illness: No What is the longest time patient has a held a job?: [employer] working in [employer] Where was the patient employed at that time?: About 15 years Has patient ever been in the Eli Lilly and Companymilitary?: No Has patient ever served in combat?: No Did You Receive Any Psychiatric Treatment/Services While in Equities traderthe Military?: No Are There Guns or Other Weapons in Your Home?: No Are These ComptrollerWeapons Safely Secured?:  (n/a; Pt states he does not own a gun)  Architectinancial Resources:   Financial resources: No income Does patient have a Lawyerrepresentative payee or guardian?: No  Alcohol/Substance Abuse:   What has been your use of drugs/alcohol within the last 12 months?: Patient denies If attempted suicide, did drugs/alcohol play a role in this?: No Alcohol/Substance Abuse Treatment Hx: Denies past history Has alcohol/substance abuse ever caused legal problems?: No  Social Support  System:   Patient's Community Support System: None Describe Community Support System:  Pt states he has no support and feels abandoned by his children and family.  Type of faith/religion: n/a How does patient's faith help to cope with current illness?: "Not really"  Leisure/Recreation:   Leisure and Hobbies: patient states he used to like to study  Strengths/Needs:   What things does the patient do well?: Friendly In what areas does patient struggle / problems for patient: suicidal thoughts, memory issues, confusion  Discharge Plan:   Does patient have access to transportation?: No Plan for no access to transportation at discharge: CSW still assessing appropriate discharge plan.  Will patient be returning to same living situation after discharge?:  (CSW still assessing appropriate discharge plan. ) Currently receiving community mental health services: No Does patient have financial barriers related to discharge medications?: Yes Patient description of barriers related to discharge medications: CSW still assessing appropriate discharge plan.   Summary/Recommendations:   Patient is a 66 year old male admitted involuntarily with a diagnosis of Major depressive disorder, recurrent severe without psychotic features and cognitive decline. Information was obtained from psychosocial assessment completed with patient and chart review conducted by this evaluator. Patient presented to the [facility] reporting fatigue, isolating self from other, loss of interest in usual pleasures, and worthlessness. Patient reports primary triggers for admission were lack of support system, homelessness, no job, no income, and worsening confusion and a decline in memory. Patient reports living in a car for a few months but states he does not know where his car is now. Patient has no contact with his children or other family members. Patient will benefit from crisis stabilization, medication evaluation, group therapy and psycho education in addition to case management for discharge. At discharge, it is recommended  that patient remain compliant with established discharge plan and continued treatment.    Tiffny Gemmer G. Garnette Czech MSW, LCSWA 03/24/2017 11:07 AM

## 2017-03-24 NOTE — Progress Notes (Signed)
Patient isolative in room, irritable, guarded and angry. Refusing to cooperate during assessment. Was transported to CT scan as scheduled. Patient returned to the unit and refused to go to the dayroom. Refused to perform hygiene. Medications were taken to him in room. Currently in bed sleeping. Safety precautions maintained.

## 2017-03-24 NOTE — Progress Notes (Signed)
Pt very guarded and withdrawn to room. Continues to refuse hygiene. He gets irritable and states" I need you to show me what I need to do". Pt does not elaborate feelings. Will continue to monitor.

## 2017-03-24 NOTE — Evaluation (Signed)
Physical Therapy Evaluation Patient Details Name: Bryan Farrell MRN: [id number] DOB: [birth year] Today's Date: 03/24/2017   History of Present Illness  Bryan Farrell is a 66 year old male with a history of depression and cognitive decline.  Pt with recent fall in the shower while in the ED at [facility].    Clinical Impression  Pt presents to PT with generalized LE weakness and antalgic gait pattern.  Pt's main focus at this time is getting in touch with his family.  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  Pt complete the five time sit to stand test in 15.78 seconds indicated LE functional strength deficits and decreased dynamic balance.  Pt ideally would benefit from outpatient PT services post discharge.  At this time pt demonstrated low interest in PT services, however will go ahead and trial PT interventions while pt is here.    Follow Up Recommendations Outpatient PT    Equipment Recommendations  None recommended by PT    Recommendations for Other Services       Precautions / Restrictions Precautions Precautions: Fall Precaution Comments: High Required Braces or Orthoses: Other Brace/Splint Other Brace/Splint: R wrist, per X-ray no fracture Restrictions Weight Bearing Restrictions: No      Mobility  Bed Mobility Overal bed mobility: Modified Independent             General bed mobility comments: Extra time/effort to transfer  Transfers Overall transfer level: Modified independent Equipment used: None    General transfer comment: Sit<>stand with Mod I using bed rail in L hand  Ambulation/Gait Ambulation/Gait assistance: Independent Ambulation Distance (Feet): 150 Feet Assistive device: None Gait Pattern/deviations: Antalgic     General Gait Details: Gait generally steady with slow cadence and L sided limp.  Stairs    Wheelchair Mobility    Modified Rankin (Stroke  Patients Only)       Balance Overall balance assessment: Modified Independent     Pertinent Vitals/Pain Pain Assessment: No/denies pain    Home Living Family/patient expects to be discharged to:: Shelter/Homeless Living Arrangements: Alone (homeless)   Additional Comments: Pt was living in his car after evicted from hotel in [place].    Prior Function Level of Independence: Independent        Extremity/Trunk Assessment   Upper Extremity Assessment Upper Extremity Assessment: Overall WFL for tasks assessed    Lower Extremity Assessment Lower Extremity Assessment: Overall WFL for tasks assessed    Cervical / Trunk Assessment Cervical / Trunk Assessment: Normal  Communication   Communication: No difficulties  Cognition Arousal/Alertness: Awake/alert Behavior During Therapy: Restless (distracted) Overall Cognitive Status: Within Functional Limits for tasks assessed   General Comments: Pt's focus is on getting in touch with family, pt is frustrated by current situation      General Comments General comments (skin integrity, edema, etc.): Five Time Sit<>Stand: 15.78 seconds (using L UE on bed rail)     Assessment/Plan    PT Assessment Patient needs continued PT services  PT Problem List Decreased strength;Decreased activity tolerance       PT Treatment Interventions Gait training;Functional mobility training;Therapeutic activities;Therapeutic exercise;Balance training    PT Goals (Current goals can be found in the Care Plan section)  Acute Rehab PT Goals Patient Stated Goal: None stated. PT Goal Formulation: With patient Time For Goal Achievement: 03/31/17 Potential to Achieve Goals: Fair    Frequency Min 2X/week  Barriers to discharge Other (comment) homeless       End of Session   Activity Tolerance: Patient tolerated treatment well Patient left: in bed Nurse Communication: Mobility status PT Visit Diagnosis: Muscle weakness (generalized)  (M62.81);History of falling (Z91.81)    Time: [id number] PT Time Calculation (min) (ACUTE ONLY): 33 min   Charges:   PT Evaluation $PT Eval Low Complexity: 1 Low PT Treatments $Therapeutic Activity: 8-22 mins     Tokiko Diefenderfer A Kesha Hurrell, PT 03/24/2017, 4:49 PM

## 2017-03-24 NOTE — [facility] Group Notes (Signed)
[facility] Group Notes:  (Nursing/MHT/Case Management/Adjunct)  Date:  03/24/2017  Time:  9:21 PM  Type of Therapy:  Evening Wrap-up Group  Participation Level:  Minimal  Participation Quality:  Appropriate  Affect:  Flat  Cognitive:  Appropriate  Insight:  Improving  Engagement in Group:  Improving  Modes of Intervention:  Discussion  Summary of Progress/Problems:  Tomasita MorrowChelsea Nanta Analyce Tavares 03/24/2017, 9:21 PM

## 2017-03-24 NOTE — Plan of Care (Signed)
Problem: Self-Concept: Goal: Ability to disclose and discuss suicidal ideas will improve Outcome: Not Progressing Pt refusing to talk about his condition. Avoiding

## 2017-03-24 NOTE — Progress Notes (Signed)
[facility] MD Progress Note  03/24/2017 9:57 AM Bryan Farrell  MRN:  [id number] Subjective:  Patient is a 66 year old Caucasian male admitted for suicidal thoughts and has cognitive decline. Patient not very forthcoming this morning. His just laying in bed and states that he does not have a place to go. States that they will contact his son. He is unable to answer questions about what he was doing prior to coming to the hospital. Per initial assessment patient had been staying at a hotel in [place] until his money from an inheritance ran out. He was evicted from the hotel and was living in his car. He did tell this clinician that he was living in his car and was trying to reach his son and daughter. He denies any suicidal thoughts this morning. States that he ate something but he does not know what he ate.  Per nursing patient is guarded and isolated and has not been participating in activities. Patient refusing to participate in activities and refusing to go to the day room. Not participating in hygiene activities.  Principal Problem: Major depressive disorder, recurrent severe without psychotic features (HCC) Diagnosis:   Patient Active Problem List   Diagnosis Date Noted  . Cognitive decline [R41.89] 03/23/2017  . Suicidal ideation [R45.851] 03/23/2017  . Homelessness [Z59.0] 03/23/2017  . Diabetes (HCC) [E11.9] 03/23/2017  . Major depressive disorder, recurrent severe without psychotic features (HCC) [F33.2] 03/22/2017   Total Time spent with patient: 30 minutes  Past Psychiatric History: There is one suicide attempt by hanging in 2011 and another psychiatric admission last year for depression and suicidal ideation. He has not follow up with outpatient provider.    Past Medical History:  Past Medical History:  Diagnosis Date  . Diabetes mellitus without complication [facility])     Past Surgical History:  Procedure Laterality Date  . TONSILLECTOMY     Family History: History reviewed.  No pertinent family history. Family Psychiatric  History: Unknown Social History:  History  Alcohol Use No     History  Drug Use No    Social History   Social History  . Marital status: Single    Spouse name: N/A  . Number of children: N/A  . Years of education: N/A   Social History Main Topics  . Smoking status: Never Smoker  . Smokeless tobacco: Never Used  . Alcohol use No  . Drug use: No  . Sexual activity: Not Asked   Other Topics Concern  . None   Social History Narrative  . None   Additional Social History:                         Sleep: Fair  Appetite:  Fair  Current Medications: Current Facility-Administered Medications  Medication Dose Route Frequency Provider Last Rate Last Dose  . acetaminophen (TYLENOL) tablet 650 mg  650 mg Oral Q6H PRN Jimmy Footman, MD      . alum & mag hydroxide-simeth (MAALOX/MYLANTA) 200-200-20 MG/5ML suspension 30 mL  30 mL Oral Q4H PRN Jimmy Footman, MD      . feeding supplement (GLUCERNA SHAKE) (GLUCERNA SHAKE) liquid 237 mL  237 mL Oral TID BM Pucilowska, Jolanta B, MD   237 mL at 03/23/17 2110  . FLUoxetine (PROZAC) capsule 20 mg  20 mg Oral QHS Pucilowska, Jolanta B, MD   20 mg at 03/23/17 2109  . hydrOXYzine (ATARAX/VISTARIL) tablet 25 mg  25 mg Oral TID PRN Jimmy Footman, MD  25 mg at 03/23/17 2110  . magnesium hydroxide (MILK OF MAGNESIA) suspension 30 mL  30 mL Oral Daily PRN Jimmy FootmanHernandez-Gonzalez, Andrea, MD      . traZODone (DESYREL) tablet 50 mg  50 mg Oral QHS PRN Jimmy FootmanHernandez-Gonzalez, Andrea, MD   50 mg at 03/23/17 2109    Lab Results:  Results for orders placed or performed during the hospital encounter of 03/22/17 (from the past 48 hour(s))  HIV antibody     Status: None   Collection Time: 03/23/17  2:55 PM  Result Value Ref Range   HIV Screen 4th Generation wRfx Non Reactive Non Reactive    Comment: (NOTE) Performed At: [facility] [address]  [place], [place] [id number] Mila HomerHancock William F MD RC:[id number]:[phone]   Glucose, capillary     Status: Abnormal   Collection Time: 03/24/17  7:06 AM  Result Value Ref Range   Glucose-Capillary 202 (H) 65 - 99 mg/dL   Comment 1 Notify RN   Lipid panel     Status: Abnormal   Collection Time: 03/24/17  7:08 AM  Result Value Ref Range   Cholesterol 200 0 - 200 mg/dL   Triglycerides 102156 (H) <150 mg/dL   HDL 34 (L) >58>40 mg/dL   Total CHOL/HDL Ratio 5.9 RATIO   VLDL 31 0 - 40 mg/dL   LDL Cholesterol 527135 (H) 0 - 99 mg/dL    Comment:        Total Cholesterol/HDL:CHD Risk Coronary Heart Disease Risk Table                     Men   Women  1/2 Average Risk   3.4   3.3  Average Risk       5.0   4.4  2 X Average Risk   9.6   7.1  3 X Average Risk  23.4   11.0        Use the calculated Patient Ratio above and the CHD Risk Table to determine the patient's CHD Risk.        ATP III CLASSIFICATION (LDL):  <100     mg/dL   Optimal  [id number]  mg/dL   Near or Above                    Optimal  130-159  mg/dL   Borderline  [id number]  mg/dL   High  >315>190     mg/dL   Very High   TSH     Status: None   Collection Time: 03/24/17  7:08 AM  Result Value Ref Range   TSH 1.447 0.350 - 4.500 uIU/mL    Comment: Performed by a 3rd Generation assay with a functional sensitivity of <=0.01 uIU/mL.    Blood Alcohol level:  Lab Results  Component Value Date   [facility]   ETH <11 04/20/2014    Metabolic Disorder Labs: No results found for: HGBA1C, MPG No results found for: PROLACTIN Lab Results  Component Value Date   CHOL 200 03/24/2017   TRIG 156 (H) 03/24/2017   HDL 34 (L) 03/24/2017   CHOLHDL 5.9 03/24/2017   VLDL 31 03/24/2017   LDLCALC 135 (H) 03/24/2017    Physical Findings: AIMS: Facial and Oral Movements Muscles of Facial Expression: None, normal Lips and Perioral Area: None, normal Jaw: None, normal Tongue: None, normal,Extremity Movements Upper (arms, wrists, hands, fingers): None,  normal Lower (legs, knees, ankles, toes): None, normal, Trunk Movements Neck, shoulders, hips: None, normal,  Overall Severity Severity of abnormal movements (highest score from questions above): None, normal Incapacitation due to abnormal movements: None, normal Patient's awareness of abnormal movements (rate only patient's report): No Awareness, Dental Status Current problems with teeth and/or dentures?: Yes ("Most of my teeth has fallen out." does not wear dentures) Does patient usually wear dentures?: No  CIWA:  CIWA-Ar Total: 0 COWS:     Musculoskeletal: Strength & Muscle Tone: decreased Gait & Station: broad based Patient leans: N/A  Psychiatric Specialty Exam: Physical Exam  ROS  Blood pressure 130/80, pulse 80, temperature 98.5 F (36.9 C), temperature source Oral, resp. rate 18, height 6' (1.829 m), weight 169 lb (76.7 kg), SpO2 100 %.Body mass index is 22.92 kg/m.  General Appearance: Disheveled  Eye Contact:  Minimal  Speech:  Garbled and Slow  Volume:  Decreased  Mood:  Anxious, Depressed and Hopeless  Affect:  Constricted and Depressed  Thought Process:  Irrelevant  Orientation:  Unable to assess   Thought Content:  WDL  Suicidal Thoughts:  Yes.  without intent/plan  Homicidal Thoughts:  No  Memory:  Immediate;   Poor Recent;   Poor Remote;   Poor  Judgement:  Impaired  Insight:  Lacking  Psychomotor Activity:  Decreased  Concentration:  Concentration: Poor and Attention Span: Poor  Recall:  Poor  Fund of Knowledge:  Poor  Language:  Fair  Akathisia:  No  Handed:  Right  AIMS (if indicated):     Assets:  Communication Skills  ADL's:  Impaired  Cognition:  Impaired,  Moderate  Sleep:  Number of Hours: 5.75     Treatment Plan Summary: Daily contact with patient to assess and evaluate symptoms and progress in treatment and Medication management   Mr. Douthit is a 66 year old male with a history of depression admitted for suicidal ideation in the context  of severe social stressors, lack of support, and cognitive decline.  1. Suicidal ideation. The patient denies feeling suicidal today. He is able to contract for safety in the hospital.  2. Mood. Continue Prozac for depression.  3. Insomnia. Trazodone is available.  4. Diabetes. We ordered a diet and CABG testing.blood glucose 202  5. Metabolic syndrome monitoring. Lipid panel shows elevated triglycerides, TSH wnl, hemoglobin A1c  pending.  6. EKG. QTC 501.  7. Cognitive decline. We will order a head CT scan, MOCA, vitamin B12 level, RPR .  HIV testing was nonreactive  8. Falls. The patient fell in the shower in the ER. Wrist X-ray negative. He wears a brace. PT assessment.  9. Weight loss. We will offer Glucerna.  10. Social. So far there is nobody could reach his family. He believes that his car with all his possessions could have been stolen or impounded.  11. Disposition. To be established. The patient has no income, seems demented, unable to care for himself and with no social support.  Patrick North, MD 03/24/2017, 9:57 AM

## 2017-03-24 NOTE — Plan of Care (Signed)
Problem: Coping: Goal: Ability to cope will improve Outcome: Not Progressing Pt has not shown use of positive coping skills while hospitalized.

## 2017-03-24 NOTE — [facility] Group Notes (Signed)
[facility] LCSW Group Therapy  03/24/2017 2:29 PM  Type of Therapy:  Group Therapy  Participation Level:  Patient did not attend group. CSW invited patient to group.   Summary of Progress/Problems:   Bryan Farrell MSW, LCSWA 03/24/2017, 2:29 PM

## 2017-03-24 NOTE — Plan of Care (Signed)
Problem: Health Behavior/Discharge Planning: Goal: Compliance with therapeutic regimen will improve Outcome: Not Progressing Guarded, isolative, refusing to participate in activities

## 2017-03-24 NOTE — Plan of Care (Signed)
Problem: Safety: Goal: Ability to identify and utilize support systems that promote safety will improve Outcome: Progressing Patient is monitored near the nurses station for safety

## 2017-03-24 NOTE — Progress Notes (Signed)
Pt refused noon bs check at this time

## 2017-03-24 NOTE — [facility] Suicide Risk Assessment (Signed)
[facility] INPATIENT:  Family/Significant Other Suicide Prevention Education  Suicide Prevention Education:  Contact Attempts: Gemma PayorKenneth Nelon [phone]) & Seymour Barsonya Grubb [phone]), has been identified by the patient as the family member/significant other with whom the patient will be residing, and identified as the person(s) who will aid the patient in the event of a mental health crisis.  With written consent from the patient, two attempts were made to provide suicide prevention education, prior to and/or following the patient's discharge.  We were unsuccessful in providing suicide prevention education.  A suicide education pamphlet was given to the patient to share with family/significant other.  Gemma PayorKenneth Buchner [phone]) Date and time of first attempt:  03/24/2017 / 10:51am; No answer, left voicemail asking for returned call.  Date and time of second attempt: /   Seymour Barsonya Grubb [phone]) Date and time of first attempt: 03/24/2017 / 10:49am; No answer, left voicemail asking for returned call.  Date and time of second attempt: /   Havanna Groner G. Garnette CzechSampson MSW, LCSWA 03/24/2017, 11:23 AM

## 2017-03-25 DIAGNOSIS — E538 Deficiency of other specified B group vitamins: Secondary | ICD-10-CM | POA: Diagnosis present

## 2017-03-25 LAB — GLUCOSE, CAPILLARY
GLUCOSE-CAPILLARY: 189 mg/dL — AB (ref 65–99)
GLUCOSE-CAPILLARY: 182 mg/dL — AB (ref 65–99)
GLUCOSE-CAPILLARY: 141 mg/dL — AB (ref 65–99)

## 2017-03-25 LAB — RPR: RPR: NONREACTIVE

## 2017-03-25 MED ORDER — VITAMIN B-12 1000 MCG PO TABS
1000.0000 ug | ORAL_TABLET | Freq: Every day | ORAL | Status: DC
  Administered 2017-03-25 – 2017-03-28 (×4): 1000 ug via ORAL
  Filled 2017-03-25 (×4): qty 1

## 2017-03-25 NOTE — Progress Notes (Signed)
[facility]  03/25/2017 1:25 PM Bryan Farrell  MRN:  [id number] Subjective:  Patient is a 66 year old Caucasian male admitted for suicidal thoughts and has cognitive decline. Patient continues to lay in bed and not say very much. Apparently he had a follow-up with his son and they will not be able to contact his son so he couldn't go home to him. Patient continues to be isolated and not attending any programming. He has poor hygiene and declines to take a shower. Cooperative with his medications. Denies any suicidal thoughts. Per nursing patient is guarded and isolated and has not been participating in activities. Patient refusing to participate in activities and refusing to go to the day room. Not participating in hygiene activities.  Principal Problem: Major depressive disorder, recurrent severe without psychotic features (HCC) Diagnosis:   Patient Active Problem List   Diagnosis Date Noted  . Cognitive decline [R41.89] 03/23/2017  . Suicidal ideation [R45.851] 03/23/2017  . Homelessness [Z59.0] 03/23/2017  . Diabetes (HCC) [E11.9] 03/23/2017  . Major depressive disorder, recurrent severe without psychotic features (HCC) [F33.2] 03/22/2017   Total Time spent with patient: 30 minutes  Past Psychiatric History: There is one suicide attempt by hanging in 2011 and another psychiatric admission last year for depression and suicidal ideation. He has not follow up with outpatient provider.    Past Medical History:  Past Medical History:  Diagnosis Date  . Diabetes mellitus without complication [facility](HCC)     Past Surgical History:  Procedure Laterality Date  . TONSILLECTOMY     Family History: History reviewed. No pertinent family history. Family Psychiatric  History: Unknown Social History:  History  Alcohol Use No     History  Drug Use No    Social History   Social History  . Marital status: Single    Spouse name: N/A  . Number of children: N/A  . Years of  education: N/A   Social History Main Topics  . Smoking status: Never Smoker  . Smokeless tobacco: Never Used  . Alcohol use No  . Drug use: No  . Sexual activity: Not Asked   Other Topics Concern  . None   Social History Narrative  . None   Additional Social History:                         Sleep: Fair  Appetite:  Fair  Current Medications: Current Facility-Administered Medications  Medication Dose Route Frequency Provider Last Rate Last Dose  . acetaminophen (TYLENOL) tablet 650 mg  650 mg Oral Q6H PRN Jimmy FootmanHernandez-Gonzalez, Andrea, MD      . alum & mag hydroxide-simeth (MAALOX/MYLANTA) 200-200-20 MG/5ML suspension 30 mL  30 mL Oral Q4H PRN Jimmy FootmanHernandez-Gonzalez, Andrea, MD      . feeding supplement (GLUCERNA SHAKE) (GLUCERNA SHAKE) liquid 237 mL  237 mL Oral TID BM Pucilowska, Jolanta B, MD   237 mL at 03/25/17 1000  . FLUoxetine (PROZAC) capsule 20 mg  20 mg Oral QHS Pucilowska, Jolanta B, MD   20 mg at 03/24/17 2048  . hydrOXYzine (ATARAX/VISTARIL) tablet 25 mg  25 mg Oral TID PRN Jimmy FootmanHernandez-Gonzalez, Andrea, MD   25 mg at 03/24/17 2049  . magnesium hydroxide (MILK OF MAGNESIA) suspension 30 mL  30 mL Oral Daily PRN Jimmy FootmanHernandez-Gonzalez, Andrea, MD      . traZODone (DESYREL) tablet 50 mg  50 mg Oral QHS PRN Jimmy FootmanHernandez-Gonzalez, Andrea, MD   50 mg at 03/24/17 2048    Lab  Results:  Results for orders placed or performed during the hospital encounter of 03/22/17 (from the past 48 hour(s))  HIV antibody     Status: None   Collection Time: 03/23/17  2:55 PM  Result Value Ref Range   HIV Screen 4th Generation wRfx Non Reactive Non Reactive    Comment: (NOTE) Performed At: [facility] [address] [employer] [facility], [place] [id number] Mila Homer MD KG:[id number]   Glucose, capillary     Status: Abnormal   Collection Time: 03/24/17  7:06 AM  Result Value Ref Range   Glucose-Capillary 202 (H) 65 - 99 mg/dL   Comment 1 Notify RN   Lipid panel     Status: Abnormal    Collection Time: 03/24/17  7:08 AM  Result Value Ref Range   Cholesterol 200 0 - 200 mg/dL   Triglycerides 644 (H) <150 mg/dL   HDL 34 (L) >03 mg/dL   Total CHOL/HDL Ratio 5.9 RATIO   VLDL 31 0 - 40 mg/dL   LDL Cholesterol 474 (H) 0 - 99 mg/dL    Comment:        Total Cholesterol/HDL:CHD Risk Coronary Heart Disease Risk Table                     Men   Women  1/2 Average Risk   3.4   3.3  Average Risk       5.0   4.4  2 X Average Risk   9.6   7.1  3 X Average Risk  23.4   11.0        Use the calculated Patient Ratio above and the CHD Risk Table to determine the patient's CHD Risk.        ATP III CLASSIFICATION (LDL):  <100     mg/dL   Optimal  259-563  mg/dL   Near or Above                    Optimal  130-159  mg/dL   Borderline  875-643  mg/dL   High  >329     mg/dL   Very High   TSH     Status: None   Collection Time: 03/24/17  7:08 AM  Result Value Ref Range   TSH 1.447 0.350 - 4.500 uIU/mL    Comment: Performed by a 3rd Generation assay with a functional sensitivity of <=0.01 uIU/mL.  Vitamin B12     Status: None   Collection Time: 03/24/17  7:08 AM  Result Value Ref Range   Vitamin B-12 188 180 - 914 pg/mL    Comment: (NOTE) This assay is not validated for testing neonatal or myeloproliferative syndrome specimens for Vitamin B12 levels. Performed at [facility], [address], [place], [place] [address]   RPR     Status: None   Collection Time: 03/24/17  7:08 AM  Result Value Ref Range   RPR Ser Ql Non Reactive Non Reactive    Comment: (NOTE) Performed At: [facility] [address] [place], [place] [id number] Mila Homer MD WF:[id number]   Glucose, capillary     Status: Abnormal   Collection Time: 03/24/17  4:07 PM  Result Value Ref Range   Glucose-Capillary 234 (H) 65 - 99 mg/dL  Glucose, capillary     Status: Abnormal   Collection Time: 03/25/17  7:10 AM  Result Value Ref Range   Glucose-Capillary 189 (H) 65 - 99 mg/dL    Comment 1  Notify RN   Glucose, capillary     Status: Abnormal   Collection Time: 03/25/17 11:35 AM  Result Value Ref Range   Glucose-Capillary 182 (H) 65 - 99 mg/dL    Blood Alcohol level:  Lab Results  Component Value Date   ETH <5 03/18/2017   ETH <11 04/20/2014    Metabolic Disorder Labs: No results found for: HGBA1C, MPG No results found for: PROLACTIN Lab Results  Component Value Date   CHOL 200 03/24/2017   TRIG 156 (H) 03/24/2017   HDL 34 (L) 03/24/2017   CHOLHDL 5.9 03/24/2017   VLDL 31 03/24/2017   LDLCALC 135 (H) 03/24/2017    Physical Findings: AIMS: Facial and Oral Movements Muscles of Facial Expression: None, normal Lips and Perioral Area: None, normal Jaw: None, normal Tongue: None, normal,Extremity Movements Upper (arms, wrists, hands, fingers): None, normal Lower (legs, knees, ankles, toes): None, normal, Trunk Movements Neck, shoulders, hips: None, normal, Overall Severity Severity of abnormal movements (highest score from questions above): None, normal Incapacitation due to abnormal movements: None, normal Patient's awareness of abnormal movements (rate only patient's report): No Awareness, Dental Status Current problems with teeth and/or dentures?: Yes ("Most of my teeth has fallen out." does not wear dentures) Does patient usually wear dentures?: No  CIWA:  CIWA-Ar Total: 0 COWS:     Musculoskeletal: Strength & Muscle Tone: decreased Gait & Station: broad based Patient leans: N/A  Psychiatric Specialty Exam: Physical Exam  ROS  Blood pressure 124/76, pulse 98, temperature 98.1 F (36.7 C), temperature source Oral, resp. rate 18, height 6' (1.829 m), weight 169 lb (76.7 kg), SpO2 100 %.Body mass index is 22.92 kg/m.  General Appearance: Disheveled  Eye Contact:  Minimal  Speech:  Garbled and Slow  Volume:  Decreased  Mood:  Anxious, Depressed and Hopeless  Affect:  Constricted and Depressed  Thought Process:  Irrelevant  Orientation:   full  Thought Content:  WDL  Suicidal Thoughts:  Yes.  without intent/plan  Homicidal Thoughts:  No  Memory:  Immediate;   Poor Recent;   Poor Remote;   Poor  Judgement:  Impaired  Insight:  Lacking  Psychomotor Activity:  Decreased  Concentration:  Concentration: Poor and Attention Span: Poor  Recall:  Poor  Fund of Knowledge:  Poor  Language:  Fair  Akathisia:  No  Handed:  Right  AIMS (if indicated):     Assets:  Communication Skills  ADL's:  Impaired  Cognition:  Impaired,  Moderate  Sleep:  Number of Hours: 6.15     Treatment Plan Summary: Daily contact with patient to assess and evaluate symptoms and progress in treatment and Medication management   Mr. Fissel is a 66 year old male with a history of depression admitted for suicidal ideation in the context of severe social stressors, lack of support, and cognitive decline.  1. Suicidal ideation. The patient denies feeling suicidal today. He is able to contract for safety in the hospital.  2. Mood. Continue Prozac for depression.  3. Insomnia. Trazodone is available.  4. Diabetes. We ordered a diet and CABG testing.blood glucose 182. Consult internal medicine to manage his diabetes after reviewing his  hemoglobin A1c level  5. Metabolic syndrome monitoring. Lipid panel shows elevated triglycerides, TSH wnl, hemoglobin A1c  pending.  6. EKG. QTC 501.  7. Cognitive decline. We will order a head CT scan, MOCA, vitamin B12 level, RPR .  HIV testing was nonreactive  8. Falls. The patient fell in the shower in the  ER. Wrist X-ray negative. He wears a brace. PT assessment.  9. Weight loss. We will offer Glucerna.  10. Social. So far there is nobody could reach his family. He believes that his car with all his possessions could have been stolen or impounded.  11. Disposition. To be established. The patient has no income, seems demented, unable to care for himself and with no social support.  Patrick North,  MD 03/25/2017, 1:25 PM

## 2017-03-25 NOTE — Progress Notes (Signed)
D: Pt denies SI/HI/AVH. Pt is pleasant and cooperative. Pt stated he wants to get in touch with his kids to find out what is going to happen to him. Pt stated he did not want to be homeless again sleeping under a bridge. Pt said he was in jail for sex offense, pt was looking at things with under aged kids, but had no interaction with them. Pt stated this may be one of the reasons his family may be reluctant to having him stay with them and they also believe the pt is dealing with substance abuse issues, which he denies. Pt stated he was having a hard time fitting in with his peers due to not having things in common, pt stated the groups were not helping due to a lot of them being geared towards SA. Pt stated he would like to set up a family session if it is possible.   A: Pt was offered support and encouragement. Pt was given scheduled medications. Pt was encourage to attend groups. Q 15 minute checks were done for safety.   R:Pt is taking medication. Pt has no complaints.Pt receptive to treatment and safety maintained on unit.

## 2017-03-25 NOTE — Progress Notes (Signed)
Patient was withdrawn and isolative to room, but will respond to staff if conversation is initiated first. Patient denies any SI/HI/AH/VH. Patient affect is flat and appears depressed. Patient is alert and oriented x 4, breathing unlabored, and extremities x 4 within normal limits. Patient is calm and cooperative. Patient did not display any disruptive behavior. Patient continues to be monitored on 15 minute safety checks. Will continue to monitor patient and notify MD of any changes.

## 2017-03-25 NOTE — [facility] Group Notes (Signed)
[facility] LCSW Group Therapy  03/25/2017 3:12 PM  Type of Therapy:  Group Therapy  Participation Level:  None  Participation Quality:  Attentive  Affect:  Blunted  Cognitive:  Disorganized and Confused  Insight:  None  Engagement in Therapy:  Limited  Modes of Intervention:  Discussion, Education, Limit-setting, Problem-solving, Reality Testing, Socialization and Support  Summary of Progress/Problems: Stress management: Patients defined and discussed the topic of stress and the related symptoms and triggers for stress. Patients identified healthy coping skills they would like to try during hospitalization and after discharge to manage stress in a healthy way. CSW offered insight to varying stress management techniques. CSW attempted to engage patient in group discussion, patient had difficulty completing his statements and would responsd saying "I don't really remember or I don't think so". Patient remained in group the entire time but did not participate.   Samariya Rockhold G. Garnette CzechSampson MSW, LCSWA 03/25/2017, 3:14 PM

## 2017-03-25 NOTE — Plan of Care (Signed)
Problem: Safety: Goal: Ability to remain free from injury will improve Outcome: Progressing Patient remains safe and without injury during hospitalization and on Q 15 minute observation. Will continue to monitor patient.   

## 2017-03-26 LAB — MISC LABCORP TEST (SEND OUT): Labcorp test code: 1453

## 2017-03-26 LAB — GLUCOSE, CAPILLARY
GLUCOSE-CAPILLARY: 218 mg/dL — AB (ref 65–99)
Glucose-Capillary: 231 mg/dL — ABNORMAL HIGH (ref 65–99)

## 2017-03-26 MED ORDER — METFORMIN HCL 500 MG PO TABS
500.0000 mg | ORAL_TABLET | Freq: Two times a day (BID) | ORAL | Status: DC
  Administered 2017-03-26 – 2017-03-28 (×5): 500 mg via ORAL
  Filled 2017-03-26 (×5): qty 1

## 2017-03-26 NOTE — Plan of Care (Signed)
Problem: Safety: Goal: Ability to remain free from injury will improve Outcome: Progressing Pt will remain free from falls this entire shift.

## 2017-03-26 NOTE — Progress Notes (Signed)
Recreation Therapy Notes  Date: 08.13.18 Time: 9:30 am Location: Craft Room  Group Topic: Self-expression  Goal Area(s) Addresses:  Patient will identify one color per emotion listed on wheel. Patient will verbalize benefit of using art as a means of self-expression. Patient will verbalize one emotion experienced during session. Patient will be educated on other forms of self-expression.  Behavioral Response: Attentive  Intervention: Emotion Wheel  Activity: Patients were given an Arboriculturistmotion Wheel worksheet and were instructed to pick a color for each emotion listed on the worksheet.  Education: LRT educated patients on other forms of self-expression.  Education Outcome: In group clarification offered  Clinical Observations/Feedback: Patient picked a color for each emotion listed. Patient did not contribute to group discussion.  Jacquelynn CreeGreene,Tyrail Grandfield M, LRT/CTRS 03/26/2017 10:13 AM

## 2017-03-26 NOTE — Progress Notes (Addendum)
Pt denies SI, HI or A/V hallucinations. Pt is depressed and sad. Pt is very isolative, stays in room and doesn't not interact with peers, only with staff briefly. No distress noted. Pt is a high fall risk, monitoring continues. 15 min safety checks continue. Pt slept 6.45 hrs.

## 2017-03-26 NOTE — [facility] Group Notes (Signed)
[facility] LCSW Group Therapy Note  Date/Time: 03/26/17, 1300  Type of Therapy and Topic:  Group Therapy:  Overcoming Obstacles  Participation Level:  moderate  Description of Group:    In this group patients will be encouraged to explore what they see as obstacles to their own wellness and recovery. They will be guided to discuss their thoughts, feelings, and behaviors related to these obstacles. The group will process together ways to cope with barriers, with attention given to specific choices patients can make. Each patient will be challenged to identify changes they are motivated to make in order to overcome their obstacles. This group will be process-oriented, with patients participating in exploration of their own experiences as well as giving and receiving support and challenge from other group members.  Therapeutic Goals: 1. Patient will identify personal and current obstacles as they relate to admission. 2. Patient will identify barriers that currently interfere with their wellness or overcoming obstacles.  3. Patient will identify feelings, thought process and behaviors related to these barriers. 4. Patient will identify two changes they are willing to make to overcome these obstacles:    Summary of Patient Progress: Pt somewhat reluctant to engage today but did speak after CSW asked him some questions.  Pt identified housing, employment, and lack of family contact as barriers.  Pt reporting he has no place to live and no income currently, was not able to come up with any solutions during group but was appropriate.      Therapeutic Modalities:   Cognitive Behavioral Therapy Solution Focused Therapy Motivational Interviewing Relapse Prevention Therapy  Daleen SquibbGreg Blayde Bacigalupi, LCSW

## 2017-03-26 NOTE — Progress Notes (Signed)
[facility]  03/26/2017 1:19 PM Bryan Farrell  MRN:  [id number] Subjective:  Patient is a 66 year old Caucasian male admitted for suicidal thoughts and has cognitive decline. Patient not very forthcoming this morning. His just laying in bed and states that he does not have a place to go. States that they will contact his son. He is unable to answer questions about what he was doing prior to coming to the hospital. Per initial assessment patient had been staying at a hotel in [place] until his money from an inheritance ran out. He was evicted from the hotel and was living in his car. He did tell this clinician that he was living in his car and was trying to reach his son and daughter. He denies any suicidal thoughts this morning. States that he ate something but he does not know what he ate.  03/26/2017. Bryan Farrell still claims he is confused and unable to recall much. He answers "I don't know or I don't remember" to all my questions. He however provides some information, possibly unintentionally. He was disappointed that we were unable to reach [place] children in [place] by phone and offered another phone number to the "mother of my children" Learta CoddingSunny, who is "[place] and does not speak good English". SW will try to contact her. Today, he is unconcerned about his car. He is aware of activities on the unit and participates in programming without prompting. He does not get lost on the unit which is puzzling given his "confusion". He tolerates medications well. Sleep and appetite are fair. HgbA1C is elevated.  Per nursing:  Patient was withdrawn and isolative to room, but will respond to staff if conversation is initiated first. Patient denies any SI/HI/AH/VH. Patient affect is flat and appears depressed. Patient is alert and oriented x 4, breathing unlabored, and extremities x 4 within normal limits. Patient is calm and cooperative. Patient did not display any disruptive behavior. Patient  continues to be monitored on 15 minute safety checks. Will continue to monitor patient and notify MD of any changes.  Principal Problem: Vascular dementia Diagnosis:   Patient Active Problem List   Diagnosis Date Noted  . Vitamin B12 deficiency [E53.8] 03/25/2017  . Vascular dementia [F01.50] 03/23/2017  . Suicidal ideation [R45.851] 03/23/2017  . Homelessness [Z59.0] 03/23/2017  . Diabetes (HCC) [E11.9] 03/23/2017  . Major depressive disorder, recurrent severe without psychotic features (HCC) [F33.2] 03/22/2017   Total Time spent with patient: 30 minutes  Past Psychiatric History: There is one suicide attempt by hanging in 2011 and another psychiatric admission last year for depression and suicidal ideation. He has not follow up with outpatient provider.    Past Medical History:  Past Medical History:  Diagnosis Date  . Diabetes mellitus without complication [facility](HCC)     Past Surgical History:  Procedure Laterality Date  . TONSILLECTOMY     Family History: History reviewed. No pertinent family history. Family Psychiatric  History: Unknown Social History:  History  Alcohol Use No     History  Drug Use No    Social History   Social History  . Marital status: Single    Spouse name: N/A  . Number of children: N/A  . Years of education: N/A   Social History Main Topics  . Smoking status: Never Smoker  . Smokeless tobacco: Never Used  . Alcohol use No  . Drug use: No  . Sexual activity: Not Asked   Other Topics Concern  . None  Social History Narrative  . None   Additional Social History:                         Sleep: Fair  Appetite:  Fair  Current Medications: Current Facility-Administered Medications  Medication Dose Route Frequency Provider Last Rate Last Dose  . acetaminophen (TYLENOL) tablet 650 mg  650 mg Oral Q6H PRN Jimmy Footman, MD      . alum & mag hydroxide-simeth (MAALOX/MYLANTA) 200-200-20 MG/5ML suspension 30 mL  30  mL Oral Q4H PRN Jimmy Footman, MD      . feeding supplement (GLUCERNA SHAKE) (GLUCERNA SHAKE) liquid 237 mL  237 mL Oral TID BM Passion Lavin B, MD   237 mL at 03/25/17 2137  . FLUoxetine (PROZAC) capsule 20 mg  20 mg Oral QHS Trellis Vanoverbeke B, MD   20 mg at 03/25/17 2134  . hydrOXYzine (ATARAX/VISTARIL) tablet 25 mg  25 mg Oral TID PRN Jimmy Footman, MD   25 mg at 03/25/17 2134  . magnesium hydroxide (MILK OF MAGNESIA) suspension 30 mL  30 mL Oral Daily PRN Jimmy Footman, MD      . traZODone (DESYREL) tablet 50 mg  50 mg Oral QHS PRN Jimmy Footman, MD   50 mg at 03/25/17 2134  . vitamin B-12 (CYANOCOBALAMIN) tablet 1,000 mcg  1,000 mcg Oral Daily Angella Montas B, MD   1,000 mcg at 03/26/17 0920    Lab Results:  Results for orders placed or performed during the hospital encounter of 03/22/17 (from the past 48 hour(s))  Glucose, capillary     Status: Abnormal   Collection Time: 03/24/17  4:07 PM  Result Value Ref Range   Glucose-Capillary 234 (H) 65 - 99 mg/dL  Glucose, capillary     Status: Abnormal   Collection Time: 03/25/17  7:10 AM  Result Value Ref Range   Glucose-Capillary 189 (H) 65 - 99 mg/dL   Comment 1 Notify RN   Glucose, capillary     Status: Abnormal   Collection Time: 03/25/17 11:35 AM  Result Value Ref Range   Glucose-Capillary 182 (H) 65 - 99 mg/dL  Glucose, capillary     Status: Abnormal   Collection Time: 03/25/17  4:25 PM  Result Value Ref Range   Glucose-Capillary 141 (H) 65 - 99 mg/dL  Glucose, capillary     Status: Abnormal   Collection Time: 03/26/17  7:02 AM  Result Value Ref Range   Glucose-Capillary 231 (H) 65 - 99 mg/dL    Blood Alcohol level:  Lab Results  Component Value Date   ETH <5 03/18/2017   ETH <11 04/20/2014    Metabolic Disorder Labs: No results found for: HGBA1C, MPG No results found for: PROLACTIN Lab Results  Component Value Date   CHOL 200 03/24/2017   TRIG 156  (H) 03/24/2017   HDL 34 (L) 03/24/2017   CHOLHDL 5.9 03/24/2017   VLDL 31 03/24/2017   LDLCALC 135 (H) 03/24/2017    Physical Findings: AIMS: Facial and Oral Movements Muscles of Facial Expression: None, normal Lips and Perioral Area: None, normal Jaw: None, normal Tongue: None, normal,Extremity Movements Upper (arms, wrists, hands, fingers): None, normal Lower (legs, knees, ankles, toes): None, normal, Trunk Movements Neck, shoulders, hips: None, normal, Overall Severity Severity of abnormal movements (highest score from questions above): None, normal Incapacitation due to abnormal movements: None, normal Patient's awareness of abnormal movements (rate only patient's report): No Awareness, Dental Status Current problems with teeth and/or dentures?:  Yes ("Most of my teeth has fallen out." does not wear dentures) Does patient usually wear dentures?: No  CIWA:  CIWA-Ar Total: 0 COWS:     Musculoskeletal: Strength & Muscle Tone: decreased Gait & Station: broad based Patient leans: N/A  Psychiatric Specialty Exam: Physical Exam  Nursing note and vitals reviewed. Psychiatric: His speech is normal. His affect is blunt and inappropriate. He is slowed and withdrawn. Cognition and memory are impaired. He expresses inappropriate judgment. He expresses suicidal ideation. He exhibits abnormal recent memory and abnormal remote memory.    Review of Systems  Constitutional: Positive for malaise/fatigue and weight loss.  Musculoskeletal: Positive for joint pain.  Neurological: Positive for weakness.  Psychiatric/Behavioral: Positive for depression, memory loss and suicidal ideas.  All other systems reviewed and are negative.   Blood pressure 91/67, pulse 89, temperature 98.8 F (37.1 C), resp. rate 18, height 6' (1.829 m), weight 76.7 kg (169 lb), SpO2 100 %.Body mass index is 22.92 kg/m.  General Appearance: Disheveled  Eye Contact:  Minimal  Speech:  Garbled and Slow  Volume:   Decreased  Mood:  Anxious, Depressed and Hopeless  Affect:  Constricted and Depressed  Thought Process:  Irrelevant  Orientation:  Unable to assess   Thought Content:  WDL  Suicidal Thoughts:  Yes.  without intent/plan  Homicidal Thoughts:  No  Memory:  Immediate;   Poor Recent;   Poor Remote;   Poor  Judgement:  Impaired  Insight:  Lacking  Psychomotor Activity:  Decreased  Concentration:  Concentration: Poor and Attention Span: Poor  Recall:  Poor  Fund of Knowledge:  Poor  Language:  Fair  Akathisia:  No  Handed:  Right  AIMS (if indicated):     Assets:  Communication Skills  ADL's:  Impaired  Cognition:  Impaired,  Moderate  Sleep:  Number of Hours: 6.3     Treatment Plan Summary: Daily contact with patient to assess and evaluate symptoms and progress in treatment and Medication management   Bryan Farrell is a 66 year old male with a history of depression admitted for suicidal ideation in the context of severe social stressors, lack of support, and cognitive decline.  1. Suicidal ideation. The patient denies feeling suicidal today. He is able to contract for safety in the hospital.  2. Mood. Continue Prozac for depression.  3. Insomnia. Trazodone is available.  4. Diabetes. We started Metformin, ADA diet and CABG testing.   5. Metabolic syndrome monitoring. Lipid panel shows elevated triglycerides, normal TSH and hemoglobin A1c  Of 9.8.   6. EKG. QTC 501.  7. Cognitive decline. Head CT scan shows vascular changes. RPR nonreactive.  8. Falls. The patient fell in the shower in the ER. Wrist X-ray negative. He wears a brace. PT assessment.  9. Vit B12 defficiancy. We started supplementation.  10. Weight loss. We will offer Glucerna.  11. Social. So far there is nobody could reach his family. He believes that his car with all his possessions could have been stolen or impounded.  12. Disposition. To be established. The patient has no income, seems  demented, unable to care for himself and with no social support.  Kristine Linea, MD 03/26/2017, 1:19 PM

## 2017-03-26 NOTE — Progress Notes (Signed)
Inpatient Diabetes Program Recommendations  AACE/ADA: New Consensus Statement on Inpatient Glycemic Control (2015)  Target Ranges:  Prepandial:   less than 140 mg/dL      Peak postprandial:   less than 180 mg/dL (1-2 hours)      Critically ill patients:  140 - 180 mg/dL   Results for Bryan Farrell, Bryan Farrell (MRN [id number]) as of 03/26/2017 11:19  Ref. Range 03/25/2017 07:10 03/25/2017 11:35 03/25/2017 16:25 03/26/2017 07:02  Glucose-Capillary Latest Ref Range: 65 - 99 mg/dL 147189 (H) 829182 (H) 562141 (H) 231 (H)    Admit with: Suicidal Thoughts  History: DM 2  Home DM Meds: None  Current Insulin Orders: None      MD- Please consider placing orders for Novolog Sensitive Correction Scale/ SSI (0-9 units) TID AC + HS  (Use Glycemic Control Order set)      --Will follow patient during hospitalization--  Ambrose FinlandJeannine Johnston Reynalda Canny RN, MSN, CDE Diabetes Coordinator Inpatient Glycemic Control Team Team Pager: [phone] (8a-5p)

## 2017-03-26 NOTE — Progress Notes (Signed)
Patient is isolated and stayed in the room.Affect is flat and depressed.Most of the questions patient says "I don't remember".Denies suicidal or homicidal ideations and AV hallucinations.Did not attended groups and refused to go the courtyard.Compliant with medications.Appetite and energy level good.Support & encouragement given.

## 2017-03-27 LAB — GLUCOSE, CAPILLARY
GLUCOSE-CAPILLARY: 172 mg/dL — AB (ref 65–99)
Glucose-Capillary: 171 mg/dL — ABNORMAL HIGH (ref 65–99)
Glucose-Capillary: 162 mg/dL — ABNORMAL HIGH (ref 65–99)
Glucose-Capillary: 185 mg/dL — ABNORMAL HIGH (ref 65–99)

## 2017-03-27 MED ORDER — INSULIN ASPART 100 UNIT/ML ~~LOC~~ SOLN: 0.0000 [IU] | Freq: Every day | SUBCUTANEOUS | Status: DC

## 2017-03-27 MED ORDER — INSULIN ASPART 100 UNIT/ML ~~LOC~~ SOLN
0.0000 [IU] | Freq: Three times a day (TID) | SUBCUTANEOUS | Status: DC
  Administered 2017-03-27 – 2017-03-28 (×4): 2 [IU] via SUBCUTANEOUS
  Administered 2017-03-28: 3 [IU] via SUBCUTANEOUS
  Filled 2017-03-27 (×4): qty 1

## 2017-03-27 NOTE — Plan of Care (Signed)
Problem: Self-Concept: Goal: Ability to disclose and discuss suicidal ideas will improve Outcome: Progressing Patient is attending groups today  Problem: [facility] Participation in Recreation Therapeutic Interventions Goal: STG-Other Recreation Therapy Goal (Specify) STG: Communication - Within 3 treatment sessions, patient will participate in communication activity in one treatment session to increase healthy communication skills.  Outcome: Progressing Patient is more interacting today

## 2017-03-27 NOTE — [facility] Group Notes (Signed)
[facility] LCSW Group Therapy Note  Date/Time: 03/27/17, 1500  Type of Therapy/Topic:  Group Therapy:  Feelings about Diagnosis  Participation Level:  Minimal   Mood:pleasant   Description of Group:    This group will allow patients to explore their thoughts and feelings about diagnoses they have received. Patients will be guided to explore their level of understanding and acceptance of these diagnoses. Facilitator will encourage patients to process their thoughts and feelings about the reactions of others to their diagnosis, and will guide patients in identifying ways to discuss their diagnosis with significant others in their lives. This group will be process-oriented, with patients participating in exploration of their own experiences as well as giving and receiving support and challenge from other group members.   Therapeutic Goals: 1. Patient will demonstrate understanding of diagnosis as evidence by identifying two or more symptoms of the disorder:  2. Patient will be able to express two feelings regarding the diagnosis 3. Patient will demonstrate ability to communicate their needs through discussion and/or role plays  Summary of Patient Progress: Pt initially said he did not know his diagnosis, but after several CSW questions reported that his diagnosis would be depression.  Pt endorsed several depressive symptoms such as suicidal thoughts, worthlessness and hopelessness.  Pt did participate in the discussion, particularly about stigma, stating he has felt "judged" by others due to his mental health problems.        Therapeutic Modalities:   Cognitive Behavioral Therapy Brief Therapy Feelings Identification   Daleen SquibbGreg Izan Miron, LCSW

## 2017-03-27 NOTE — [facility] Group Notes (Signed)
Goals Group Date/Time: 03/27/2017 9:00 AM Type of Therapy and Topic: Group Therapy: Goals Group: SMART Goals   Participation Level: Moderate  Description of Group:    The purpose of a daily goals group is to assist and guide patients in setting recovery/wellness-related goals. The objective is to set goals as they relate to the crisis in which they were admitted. Patients will be using SMART goal modalities to set measurable goals. Characteristics of realistic goals will be discussed and patients will be assisted in setting and processing how one will reach their goal. Facilitator will also assist patients in applying interventions and coping skills learned in psycho-education groups to the SMART goal and process how one will achieve defined goal.   Therapeutic Goals:   -Patients will develop and document one goal related to or their crisis in which brought them into treatment.  -Patients will be guided by LCSW using SMART goal setting modality in how to set a measurable, attainable, realistic and time sensitive goal.  -Patients will process barriers in reaching goal.  -Patients will process interventions in how to overcome and successful in reaching goal.   Patient's Goal: Pt goal was to "live to tomorrow."  Pt unable to come up with anything further.     Therapeutic Modalities:  Motivational Interviewing  Research officer, political partyCognitive Behavioral Therapy  Crisis Intervention Model  SMART goals setting   Bryan SquibbGreg Amariss Farrell, [place]

## 2017-03-27 NOTE — Progress Notes (Signed)
[facility] MD Progress Note  03/27/2017 9:53 AM Bryan Farrell  MRN:  [id number] Subjective:  Patient is a 66 year old Caucasian male admitted for suicidal thoughts and has cognitive decline. Patient not very forthcoming this morning. His just laying in bed and states that he does not have a place to go. States that they will contact his son. He is unable to answer questions about what he was doing prior to coming to the hospital. Per initial assessment patient had been staying at a hotel in [place] until his money from an inheritance ran out. He was evicted from the hotel and was living in his car. He did tell this clinician that he was living in his car and was trying to reach his son and daughter. He denies any suicidal thoughts this morning. States that he ate something but he does not know what he ate.  03/26/2017. Bryan Farrell still claims he is confused and unable to recall much. He answers "I don't know or I don't remember" to all my questions. He however provides some information, possibly unintentionally. He was disappointed that we were unable to reach his children in [place] by phone and offered another phone number to the "mother of my children" Learta Codding, who is "[place] and does not speak good English". SW will try to contact her. Today, he is unconcerned about his car. He is aware of activities on the unit and participates in programming without prompting. He does not get lost on the unit which is puzzling given his "confusion". He tolerates medications well. Sleep and appetite are fair. HgbA1C is elevated.  03/27/2017. There is no change in Bryan Farrell behavior. He is still unable or rather unwilling to answer my questions. We were able to get in touch with his ex-wife who might be helpful to this patient at some point. She was unwilling to provide information about his children in [facility]. The patient is preoccupied with contacting them. He even discussed it with physical therapist.  Unfortunately, he has no resources in the community and will be discharged to the homeless shelter. He is mostly secluded to his room but does not get lost and is able to participate in programming without prompting suggesting that there is less cognitive impairment that the patient wants [place] to believe.  Per nursing: Pt denies SI, HI or A/V hallucinations. Pt is depressed and sad. Pt is very isolative, stays in room and doesn't not interact with peers, only with staff briefly. No distress noted. Pt is a high fall risk, monitoring continues. 15 min safety checks continue. Pt slept 6.45 hrs.  Principal Problem: Vascular dementia Diagnosis:   Patient Active Problem List   Diagnosis Date Noted  . Vitamin B12 deficiency [E53.8] 03/25/2017  . Vascular dementia [F01.50] 03/23/2017  . Suicidal ideation [R45.851] 03/23/2017  . Homelessness [Z59.0] 03/23/2017  . Diabetes (HCC) [E11.9] 03/23/2017  . Major depressive disorder, recurrent severe without psychotic features (HCC) [F33.2] 03/22/2017   Total Time spent with patient: 30 minutes  Past Psychiatric History: There is one suicide attempt by hanging in 2011 and another psychiatric admission last year for depression and suicidal ideation. He has not follow up with outpatient provider.    Past Medical History:  Past Medical History:  Diagnosis Date  . Diabetes mellitus without complication [facility])     Past Surgical History:  Procedure Laterality Date  . TONSILLECTOMY     Family History: History reviewed. No pertinent family history. Family Psychiatric  History: Unknown Social History:  History  Alcohol Use No     History  Drug Use No    Social History   Social History  . Marital status: Single    Spouse name: N/A  . Number of children: N/A  . Years of education: N/A   Social History Main Topics  . Smoking status: Never Smoker  . Smokeless tobacco: Never Used  . Alcohol use No  . Drug use: No  . Sexual activity: Not Asked    Other Topics Concern  . None   Social History Narrative  . None   Additional Social History:                         Sleep: Fair  Appetite:  Fair  Current Medications: Current Facility-Administered Medications  Medication Dose Route Frequency Provider Last Rate Last Dose  . acetaminophen (TYLENOL) tablet 650 mg  650 mg Oral Q6H PRN Jimmy FootmanHernandez-Gonzalez, Andrea, MD      . alum & mag hydroxide-simeth (MAALOX/MYLANTA) 200-200-20 MG/5ML suspension 30 mL  30 mL Oral Q4H PRN Jimmy FootmanHernandez-Gonzalez, Andrea, MD      . feeding supplement (GLUCERNA SHAKE) (GLUCERNA SHAKE) liquid 237 mL  237 mL Oral TID BM Kamaya Keckler B, MD   237 mL at 03/26/17 1941  . FLUoxetine (PROZAC) capsule 20 mg  20 mg Oral QHS Ahmiya Abee B, MD   20 mg at 03/26/17 2213  . hydrOXYzine (ATARAX/VISTARIL) tablet 25 mg  25 mg Oral TID PRN Jimmy FootmanHernandez-Gonzalez, Andrea, MD   25 mg at 03/25/17 2134  . magnesium hydroxide (MILK OF MAGNESIA) suspension 30 mL  30 mL Oral Daily PRN Jimmy FootmanHernandez-Gonzalez, Andrea, MD      . metFORMIN (GLUCOPHAGE) tablet 500 mg  500 mg Oral BID WC Rheanna Sergent B, MD   500 mg at 03/27/17 0836  . traZODone (DESYREL) tablet 50 mg  50 mg Oral QHS PRN Jimmy FootmanHernandez-Gonzalez, Andrea, MD   50 mg at 03/25/17 2134  . vitamin B-12 (CYANOCOBALAMIN) tablet 1,000 mcg  1,000 mcg Oral Daily Vernelle Wisner B, MD   1,000 mcg at 03/27/17 0836    Lab Results:  Results for orders placed or performed during the hospital encounter of 03/22/17 (from the past 48 hour(s))  Glucose, capillary     Status: Abnormal   Collection Time: 03/25/17 11:35 AM  Result Value Ref Range   Glucose-Capillary 182 (H) 65 - 99 mg/dL  Glucose, capillary     Status: Abnormal   Collection Time: 03/25/17  4:25 PM  Result Value Ref Range   Glucose-Capillary 141 (H) 65 - 99 mg/dL  Glucose, capillary     Status: Abnormal   Collection Time: 03/26/17  7:02 AM  Result Value Ref Range   Glucose-Capillary 231 (H) 65 - 99  mg/dL  Glucose, capillary     Status: Abnormal   Collection Time: 03/26/17  4:47 PM  Result Value Ref Range   Glucose-Capillary 218 (H) 65 - 99 mg/dL  Glucose, capillary     Status: Abnormal   Collection Time: 03/27/17  7:24 AM  Result Value Ref Range   Glucose-Capillary 172 (H) 65 - 99 mg/dL   Comment 1 Notify RN     Blood Alcohol level:  Lab Results  Component Value Date   ETH <5 03/18/2017   ETH <11 04/20/2014    Metabolic Disorder Labs: No results found for: HGBA1C, MPG No results found for: PROLACTIN Lab Results  Component Value Date   CHOL 200 03/24/2017   TRIG 156 (H)  03/24/2017   HDL 34 (L) 03/24/2017   CHOLHDL 5.9 03/24/2017   VLDL 31 03/24/2017   LDLCALC 135 (H) 03/24/2017    Physical Findings: AIMS: Facial and Oral Movements Muscles of Facial Expression: None, normal Lips and Perioral Area: None, normal Jaw: None, normal Tongue: None, normal,Extremity Movements Upper (arms, wrists, hands, fingers): None, normal Lower (legs, knees, ankles, toes): None, normal, Trunk Movements Neck, shoulders, hips: None, normal, Overall Severity Severity of abnormal movements (highest score from questions above): None, normal Incapacitation due to abnormal movements: None, normal Patient's awareness of abnormal movements (rate only patient's report): No Awareness, Dental Status Current problems with teeth and/or dentures?: Yes ("Most of my teeth has fallen out." does not wear dentures) Does patient usually wear dentures?: No  CIWA:  CIWA-Ar Total: 0 COWS:     Musculoskeletal: Strength & Muscle Tone: decreased Gait & Station: broad based Patient leans: N/A  Psychiatric Specialty Exam: Physical Exam  Nursing note and vitals reviewed. Psychiatric: His speech is normal. His affect is blunt and inappropriate. He is slowed and withdrawn. Cognition and memory are impaired. He expresses inappropriate judgment. He expresses suicidal ideation. He exhibits abnormal recent  memory and abnormal remote memory.    Review of Systems  Constitutional: Positive for malaise/fatigue and weight loss.  Musculoskeletal: Positive for joint pain.  Neurological: Positive for weakness.  Psychiatric/Behavioral: Positive for depression, memory loss and suicidal ideas.  All other systems reviewed and are negative.   Blood pressure 119/82, pulse 84, temperature 98.8 F (37.1 C), temperature source Oral, resp. rate 12, height 6' (1.829 m), weight 76.7 kg (169 lb), SpO2 98 %.Body mass index is 22.92 kg/m.  General Appearance: Disheveled  Eye Contact:  Minimal  Speech:  Garbled and Slow  Volume:  Decreased  Mood:  Anxious, Depressed and Hopeless  Affect:  Constricted and Depressed  Thought Process:  Irrelevant  Orientation:  Unable to assess   Thought Content:  WDL  Suicidal Thoughts:  Yes.  without intent/plan  Homicidal Thoughts:  No  Memory:  Immediate;   Poor Recent;   Poor Remote;   Poor  Judgement:  Impaired  Insight:  Lacking  Psychomotor Activity:  Decreased  Concentration:  Concentration: Poor and Attention Span: Poor  Recall:  Poor  Fund of Knowledge:  Poor  Language:  Fair  Akathisia:  No  Handed:  Right  AIMS (if indicated):     Assets:  Communication Skills  ADL's:  Impaired  Cognition:  Impaired,  Moderate  Sleep:  Number of Hours: 6.45     Treatment Plan Summary: Daily contact with patient to assess and evaluate symptoms and progress in treatment and Medication management   Mr. Aldaco is a 66 year old male with a history of depression admitted for suicidal ideation in the context of severe social stressors, lack of support, and cognitive decline.  1. Suicidal ideation. The patient denies feeling suicidal today. He is able to contract for safety in the hospital.  2. Mood. Continue Prozac for depression.  3. Insomnia. Trazodone is available.  4. Diabetes. We started Metformin, ADA diet and SSI. Input from Diabetes Nurse Coordinator is  greatly appreciated.   5. Metabolic syndrome monitoring. Lipid panel shows elevated triglycerides, normal TSH and hemoglobin A1c of 9.8.   6. EKG. QTC 501.  7. Cognitive decline. Head CT scan shows vascular changes. RPR nonreactive.  8. Falls. The patient fell in the shower in the ER. Wrist X-ray negative. He wears a brace. PT assessment is greatly appreciated. The  patient is not interested in PT.  9. Vit B12 defficiancy. We started supplementation.  10. Weight loss. We will offer Glucerna.  11. Social. So far there is nobody could reach his family. He believes that his car with all his possessions could have been stolen or impounded.  12. Disposition. To be established. The patient has no income, seems demented, unable to care for himself and with no social support.  Kristine Linea, MD 03/27/2017, 9:53 AM

## 2017-03-27 NOTE — Progress Notes (Signed)
Patient is out of his room today and attended groups.Affect is little brighter today.Patient stated that his family is ready to give him some help.No interactions with peers.Compliant with medications.Appetite and energy level fair.Support & encouragement given.

## 2017-03-27 NOTE — Progress Notes (Signed)
Physical Therapy Treatment Patient Details Name: Bryan Farrell MRN: [id number] DOB: Nov 03, 1950 Today's Date: 03/27/2017    History of Present Illness Bryan Farrell is a 66 year old male with a history of depression and cognitive decline.  Pt with recent fall in the shower while in the ED at [facility].    PT Comments    Pt willing ot participate for return to PLOF. Pt performs bed mobility with independence, tranfers at ModI, and ambulation with independence. Pt amb total of 140 ft with no AD, presenting with SOB at end of walk. Pt provided standing exercise packet, and verbalized understanding and with no questions. Pt with good carryover to treatment. Pt 5x STS performed at 14.60 sec, which is improved from prior session, but cont to indicated impaired strength and power to B LE's. Pt cont to present with the following deficits: strength, power, endurance, and balance. Overall, pt responded well to today's treatment with no adverse affects, and has met all acute care goals. Will d/c inhouse at this time, as pt has met all acute care goals. Recommending outpatient PT, pending d/c, to address previously mentioned impairments.    Follow Up Recommendations  Outpatient PT     Equipment Recommendations  None recommended by PT    Recommendations for Other Services       Precautions / Restrictions Precautions Precautions: Fall Required Braces or Orthoses: Other Brace/Splint Other Brace/Splint: R wrist, per X-ray no fracture    Mobility  Bed Mobility Overal bed mobility: Independent             General bed mobility comments: Pt independent with bed mobility, requiring no cues for mechanics or safety.   Transfers Overall transfer level: Modified independent Equipment used: None             General transfer comment: Pt modI with STS transfers, requiring no AD and no cues for mechanics or safety. Pt requiring increased time with STS and UE support to come to stand.    Ambulation/Gait Ambulation/Gait assistance: Independent Ambulation Distance (Feet): 140 Feet Assistive device: None Gait Pattern/deviations: Antalgic     General Gait Details: Gait decreased cadence and L sided limp. Pt SOB after amb, and reporting exercise related fatigue. Pt requires no cues for mechanics or safety.    Stairs            Wheelchair Mobility    Modified Rankin (Stroke Patients Only)       Balance                                            Cognition Arousal/Alertness: Awake/alert Behavior During Therapy: Restless Overall Cognitive Status: Within Functional Limits for tasks assessed                                        Exercises Other Exercises Other Exercises: SPT provided pt with standing therex packet and provided education on mechancis and safety. Standing therex to B LE's with supervision x10 reps: marches, knee flex, hip extension, hip abd, and mini squats. Pt with good carryover.     General Comments        Pertinent Vitals/Pain Pain Assessment: No/denies pain    Home Living  Prior Function            PT Goals (current goals can now be found in the care plan section) Acute Rehab PT Goals Patient Stated Goal: None stated. PT Goal Formulation: With patient Time For Goal Achievement: 03/31/17 Potential to Achieve Goals: Good Progress towards PT goals: Goals met/education completed, patient discharged from PT    Frequency    Other (Comment) (D/C inhouse)      PT Plan Current plan remains appropriate    Co-evaluation              AM-PAC PT "6 Clicks" Daily Activity  Outcome Measure  Difficulty turning over in bed (including adjusting bedclothes, sheets and blankets)?: None Difficulty moving from lying on back to sitting on the side of the bed? : None Difficulty sitting down on and standing up from a chair with arms (e.g., wheelchair, bedside commode,  etc,.)?: None Help needed moving to and from a bed to chair (including a wheelchair)?: None Help needed walking in hospital room?: None Help needed climbing 3-5 steps with a railing? : A Little 6 Click Score: 23    End of Session   Activity Tolerance: Patient tolerated treatment well Patient left: in bed Nurse Communication: Mobility status PT Visit Diagnosis: Muscle weakness (generalized) (M62.81);History of falling (Z91.81)     Time: 0005-0567 PT Time Calculation (min) (ACUTE ONLY): 12 min  Charges:                       G Codes:       Oran Rein PT, SPT   Bevelyn Ngo 03/27/2017, 5:00 PM

## 2017-03-27 NOTE — [facility] Group Notes (Signed)
[facility] Group Notes:  (Nursing/MHT/Case Management/Adjunct)  Date:  03/27/2017  Time:  11:08 PM  Type of Therapy:  Group Therapy  Participation Level:  Active  Participation Quality:  Appropriate  Affect:  Appropriate  Cognitive:  Appropriate  Insight:  Appropriate  Engagement in Group:  Engaged  Modes of Intervention:  Discussion  Summary of Progress/Problems:  Bryan Farrell 03/27/2017, 11:08 PM

## 2017-03-27 NOTE — Progress Notes (Signed)
Recreation Therapy Notes  Date: 08.14.18 Time: 9:30 am Location: Craft Room  Group Topic: Goal Setting  Goal Area(s) Addresses:  Patient will write at least one goal. Patient will write at least one obstacle.  Behavioral Response: Attentive, Interactive  Intervention: Recovery Goal Chart  Activity: Patients were given construction paper and were instructed to make a Recovery Goal Chart including their goals, obstacles, the date they started working on their goal, and the date they achieved their goal.  Education: LRT educated patients on healthy ways to celebrate reaching their goals.  Education Outcome: In group clarification offered  Clinical Observations/Feedback: Patient wrote goals and obstacles. Patient contributed to group discussion by stating how he can stay focused on his goals.  Jacquelynn CreeGreene,Delise Simenson M, LRT/CTRS 03/27/2017 10:01 AM

## 2017-03-28 LAB — GLUCOSE, CAPILLARY
GLUCOSE-CAPILLARY: 192 mg/dL — AB (ref 65–99)
GLUCOSE-CAPILLARY: 178 mg/dL — AB (ref 65–99)
GLUCOSE-CAPILLARY: 164 mg/dL — AB (ref 65–99)
Glucose-Capillary: 213 mg/dL — ABNORMAL HIGH (ref 65–99)

## 2017-03-28 MED ORDER — FLUOXETINE HCL 20 MG PO CAPS: 20.0000 mg | ORAL_CAPSULE | Freq: Every day | ORAL | 1 refills | Status: DC

## 2017-03-28 MED ORDER — TRAZODONE HCL 100 MG PO TABS
100.0000 mg | ORAL_TABLET | Freq: Every day | ORAL | Status: DC
  Administered 2017-03-28: 100 mg via ORAL
  Filled 2017-03-28: qty 1

## 2017-03-28 MED ORDER — METFORMIN HCL 500 MG PO TABS: 500.0000 mg | ORAL_TABLET | Freq: Two times a day (BID) | ORAL | 1 refills | Status: DC

## 2017-03-28 MED ORDER — CYANOCOBALAMIN 1000 MCG PO TABS: 1000.0000 ug | ORAL_TABLET | Freq: Every day | ORAL | 1 refills | Status: DC

## 2017-03-28 MED ORDER — TRAZODONE HCL 100 MG PO TABS
100.0000 mg | ORAL_TABLET | Freq: Every day | ORAL | 1 refills | Status: DC
Start: 2017-03-28 — End: 2017-04-05

## 2017-03-28 MED ORDER — METFORMIN HCL 500 MG PO TABS
500.0000 mg | ORAL_TABLET | Freq: Two times a day (BID) | ORAL | 1 refills | Status: DC
Start: 2017-03-28 — End: 2017-04-05

## 2017-03-28 MED ORDER — TRAZODONE HCL 100 MG PO TABS
100.0000 mg | ORAL_TABLET | Freq: Every day | ORAL | 1 refills | Status: DC
Start: 2017-03-28 — End: 2017-03-28

## 2017-03-28 NOTE — Progress Notes (Signed)
Recreation Therapy Notes  Date: 08.15.18 Time: 9:30 am Location: Craft Room  Group Topic: Self-esteem  Goal Area(s) Addresses:  Patient will write at least one positive trait about self. Patient will verbalize benefit of having a healthy self-esteem.  Behavioral Response: Attentive, Interactive  Intervention: I Am  Activity: Patients were given worksheets with the letter I on it and were instructed to write as many positive traits about themselves inside the letter.  Education: LRT educated patients on ways they can increase their self-esteem.  Education Outcome: Acknowledges education/In group clarification offered  Clinical Observations/Feedback: Patient wrote positive traits about self. Patient left group at approximately 9:39 am with social worker and returned to group at approximately 9:43 am. Patient contributed to group discussion by stating it was easy then difficult to think of positive traits, what makes it difficult to think of positive traits, and how his self-esteem affects him.  Jacquelynn CreeGreene,Charee Tumblin M, LRT/CTRS 03/28/2017 10:06 AM

## 2017-03-28 NOTE — Progress Notes (Signed)
[facility] MD Progress Note  03/28/2017 9:41 AM Bryan Farrell  MRN:  [id number] Subjective:  Patient is a 66 year old Caucasian male admitted for suicidal thoughts and has cognitive decline. Patient not very forthcoming this morning. His just laying in bed and states that he does not have a place to go. States that they will contact his son. He is unable to answer questions about what he was doing prior to coming to the hospital. Per initial assessment patient had been staying at a hotel in [place] until his money from an inheritance ran out. He was evicted from the hotel and was living in his car. He did tell this clinician that he was living in his car and was trying to reach his son and daughter. He denies any suicidal thoughts this morning. States that he ate something but he does not know what he ate.  03/26/2017. [name] still claims he is confused and unable to recall much. He answers "I don't know or I don't remember" to all my questions. He however provides some information, possibly unintentionally. He was disappointed that we were unable to reach his children in [place] by phone and offered another phone number to the "mother of my children" Learta Codding, who is "[place] and does not speak good English". SW will try to contact her. Today, he is unconcerned about his car. He is aware of activities on the unit and participates in programming without prompting. He does not get lost on the unit which is puzzling given his "confusion". He tolerates medications well. Sleep and appetite are fair. HgbA1C is elevated.  03/27/2017. There is no change in [name] behavior. He is still unable or rather unwilling to answer my questions. We were able to get in touch with his ex-wife who might be helpful to this patient at some point. She was unwilling to provide information about his children in [facility]. The patient is preoccupied with contacting them. He even discussed it with physical therapist.  Unfortunately, he has no resources in the community and will be discharged to the homeless shelter. He is mostly secluded to his room but does not get lost and is able to participate in programming without prompting suggesting that there is less cognitive impairment that the patient wants [place] to believe.  03/28/2017. [name] is no longer suicidal. We do not believe that he has any cognitive decline as he functions on the unit is very well. We were able to find out that his Zenaida Niece is still parked at [employer]. The patient has car keys with him. He refuses to be discharged to the homeless shelter and insists that we find his children in [facility]. This was attempted multiple times but apparently the family is estranged from him. He is ex-wife is willing to come from [place] to help him at the later time. The patient will be discharged tomorrow to his car.   Per nursing: Bushy and unkept beards, disheveled, flat and sad affect, weak looking, encouraged to use the walker as he walks carefully and slowly; appears fragile; CBG=185 @ bed time, no coverage required, pleasant, polite, attended group. Admitted for Vascular Dementia, responds well to close-ended questions, denied SI/HI, received Prozac at bedtime.  Principal Problem: Major depressive disorder, recurrent severe without psychotic features (HCC) Diagnosis:   Patient Active Problem List   Diagnosis Date Noted  . Vitamin B12 deficiency [E53.8] 03/25/2017  . Vascular dementia [F01.50] 03/23/2017  . Suicidal ideation [R45.851] 03/23/2017  . Homelessness [Z59.0] 03/23/2017  .  Diabetes (HCC) [E11.9] 03/23/2017  . Major depressive disorder, recurrent severe without psychotic features (HCC) [F33.2] 03/22/2017   Total Time spent with patient: 30 minutes  Past Psychiatric History: There is one suicide attempt by hanging in 2011 and another psychiatric admission last year for depression and suicidal ideation. He has not follow up with outpatient provider.     Past Medical History:  Past Medical History:  Diagnosis Date  . Diabetes mellitus without complication [facility])     Past Surgical History:  Procedure Laterality Date  . TONSILLECTOMY     Family History: History reviewed. No pertinent family history. Family Psychiatric  History: Unknown Social History:  History  Alcohol Use No     History  Drug Use No    Social History   Social History  . Marital status: Single    Spouse name: N/A  . Number of children: N/A  . Years of education: N/A   Social History Main Topics  . Smoking status: Never Smoker  . Smokeless tobacco: Never Used  . Alcohol use No  . Drug use: No  . Sexual activity: Not Asked   Other Topics Concern  . None   Social History Narrative  . None   Additional Social History:                         Sleep: Fair  Appetite:  Fair  Current Medications: Current Facility-Administered Medications  Medication Dose Route Frequency Provider Last Rate Last Dose  . acetaminophen (TYLENOL) tablet 650 mg  650 mg Oral Q6H PRN Jimmy Footman, MD      . alum & mag hydroxide-simeth (MAALOX/MYLANTA) 200-200-20 MG/5ML suspension 30 mL  30 mL Oral Q4H PRN Jimmy Footman, MD      . feeding supplement (GLUCERNA SHAKE) (GLUCERNA SHAKE) liquid 237 mL  237 mL Oral TID BM Yuvraj Pfeifer B, MD   237 mL at 03/27/17 2000  . FLUoxetine (PROZAC) capsule 20 mg  20 mg Oral QHS Brigett Estell B, MD   20 mg at 03/27/17 2113  . hydrOXYzine (ATARAX/VISTARIL) tablet 25 mg  25 mg Oral TID PRN Jimmy Footman, MD   25 mg at 03/25/17 2134  . insulin aspart (novoLOG) injection 0-5 Units  0-5 Units Subcutaneous QHS Allex Lapoint B, MD      . insulin aspart (novoLOG) injection 0-9 Units  0-9 Units Subcutaneous TID WC Sunil Hue B, MD   2 Units at 03/28/17 0746  . magnesium hydroxide (MILK OF MAGNESIA) suspension 30 mL  30 mL Oral Daily PRN Jimmy Footman, MD       . metFORMIN (GLUCOPHAGE) tablet 500 mg  500 mg Oral BID WC Jareli Highland B, MD   500 mg at 03/28/17 0747  . traZODone (DESYREL) tablet 50 mg  50 mg Oral QHS PRN Jimmy Footman, MD   50 mg at 03/25/17 2134  . vitamin B-12 (CYANOCOBALAMIN) tablet 1,000 mcg  1,000 mcg Oral Daily Sumayyah Custodio B, MD   1,000 mcg at 03/28/17 0747    Lab Results:  Results for orders placed or performed during the hospital encounter of 03/22/17 (from the past 48 hour(s))  Glucose, capillary     Status: Abnormal   Collection Time: 03/26/17  4:47 PM  Result Value Ref Range   Glucose-Capillary 218 (H) 65 - 99 mg/dL  Glucose, capillary     Status: Abnormal   Collection Time: 03/27/17  7:24 AM  Result Value Ref Range   Glucose-Capillary 172 (  H) 65 - 99 mg/dL   Comment 1 Notify RN   Glucose, capillary     Status: Abnormal   Collection Time: 03/27/17 11:54 AM  Result Value Ref Range   Glucose-Capillary 171 (H) 65 - 99 mg/dL  Glucose, capillary     Status: Abnormal   Collection Time: 03/27/17  4:35 PM  Result Value Ref Range   Glucose-Capillary 162 (H) 65 - 99 mg/dL  Glucose, capillary     Status: Abnormal   Collection Time: 03/27/17  8:08 PM  Result Value Ref Range   Glucose-Capillary 185 (H) 65 - 99 mg/dL  Glucose, capillary     Status: Abnormal   Collection Time: 03/28/17  7:06 AM  Result Value Ref Range   Glucose-Capillary 192 (H) 65 - 99 mg/dL    Blood Alcohol level:  Lab Results  Component Value Date   ETH <5 03/18/2017   ETH <11 04/20/2014    Metabolic Disorder Labs: No results found for: HGBA1C, MPG No results found for: PROLACTIN Lab Results  Component Value Date   CHOL 200 03/24/2017   TRIG 156 (H) 03/24/2017   HDL 34 (L) 03/24/2017   CHOLHDL 5.9 03/24/2017   VLDL 31 03/24/2017   LDLCALC 135 (H) 03/24/2017    Physical Findings: AIMS: Facial and Oral Movements Muscles of Facial Expression: None, normal Lips and Perioral Area: None, normal Jaw: None,  normal Tongue: None, normal,Extremity Movements Upper (arms, wrists, hands, fingers): None, normal Lower (legs, knees, ankles, toes): None, normal, Trunk Movements Neck, shoulders, hips: None, normal, Overall Severity Severity of abnormal movements (highest score from questions above): None, normal Incapacitation due to abnormal movements: None, normal Patient's awareness of abnormal movements (rate only patient's report): No Awareness, Dental Status Current problems with teeth and/or dentures?: Yes ("Most of my teeth has fallen out." does not wear dentures) Does patient usually wear dentures?: No  CIWA:  CIWA-Ar Total: 0 COWS:     Musculoskeletal: Strength & Muscle Tone: decreased Gait & Station: broad based Patient leans: N/A  Psychiatric Specialty Exam: Physical Exam  Nursing note and vitals reviewed. Psychiatric: His speech is normal. His affect is blunt and inappropriate. He is slowed and withdrawn. Cognition and memory are impaired. He expresses inappropriate judgment. He expresses suicidal ideation. He exhibits abnormal recent memory and abnormal remote memory.    Review of Systems  Constitutional: Positive for malaise/fatigue and weight loss.  Musculoskeletal: Positive for joint pain.  Neurological: Positive for weakness.  Psychiatric/Behavioral: Positive for depression, memory loss and suicidal ideas.  All other systems reviewed and are negative.   Blood pressure 123/86, pulse 94, temperature 98.3 F (36.8 C), temperature source Oral, resp. rate 16, height 6' (1.829 m), weight 76.7 kg (169 lb), SpO2 98 %.Body mass index is 22.92 kg/m.  General Appearance: Disheveled  Eye Contact:  Minimal  Speech:  Garbled and Slow  Volume:  Decreased  Mood:  Anxious, Depressed and Hopeless  Affect:  Constricted and Depressed  Thought Process:  Irrelevant  Orientation:  Unable to assess   Thought Content:  WDL  Suicidal Thoughts:  Yes.  without intent/plan  Homicidal Thoughts:  No   Memory:  Immediate;   Poor Recent;   Poor Remote;   Poor  Judgement:  Impaired  Insight:  Lacking  Psychomotor Activity:  Decreased  Concentration:  Concentration: Poor and Attention Span: Poor  Recall:  Poor  Fund of Knowledge:  Poor  Language:  Fair  Akathisia:  No  Handed:  Right  AIMS (if  indicated):     Assets:  Communication Skills  ADL's:  Impaired  Cognition:  Impaired,  Moderate  Sleep:  Number of Hours: 4.45     Treatment Plan Summary: Daily contact with patient to assess and evaluate symptoms and progress in treatment and Medication management   Mr. Hanway is a 66 year old male with a history of depression admitted for suicidal ideation in the context of severe social stressors, lack of support, and cognitive decline.  1. Suicidal ideation. The patient denies feeling suicidal today. He is able to contract for safety in the hospital.  2. Mood. Continue Prozac for depression.  3. Insomnia. Trazodone is available.  4. Diabetes. We started Metformin, ADA diet and SSI. Input from Diabetes Nurse Coordinator is greatly appreciated.   5. Metabolic syndrome monitoring. Lipid panel shows elevated triglycerides, normal TSH and hemoglobin A1c of 9.8.   6. EKG. QTC 501.  7. Cognitive decline. Head CT scan shows vascular changes. RPR nonreactive.  8. Falls. The patient fell in the shower in the ER. Wrist X-ray negative. He wears a brace. PT assessment is greatly appreciated. The patient is not interested in PT.  9. Vit B12 defficiancy. We started supplementation.  10. Weight loss. We will offer Glucerna.  11. Social. So far we were unable to reach his children. He still has his car.   12. Disposition. He refuses to go to the homeless shelter. He will be discharged to his car. He will follow up with [facility].  Kristine Linea, MD 03/28/2017, 9:41 AM

## 2017-03-28 NOTE — Progress Notes (Signed)
Patient ID: Bryan Farrell, male   DOB: 1951-07-18, 66 y.o.   MRN: [id number] Bushy and unkept beards, disheveled, flat and sad affect, weak looking, encouraged to use the walker as he walks carefully and slowly; appears fragile; CBG=185 @ bed time, no coverage required, pleasant, polite, attended group. Admitted for Vascular Dementia, responds well to close-ended questions, denied SI/HI, received Prozac at bedtime.

## 2017-03-28 NOTE — Plan of Care (Signed)
Problem: Health Behavior/Discharge Planning: Goal: Compliance with therapeutic regimen will improve Outcome: Progressing Attending unit programing   Problem: Safety: Goal: Ability to identify and utilize support systems that promote safety will improve Outcome: Not Progressing Will not walk with walker   Problem: Coping: Goal: Ability to cope will improve Outcome: Not Progressing Working on coping skills   Problem: Self-Concept: Goal: Ability to disclose and discuss suicidal ideas will improve Outcome: Progressing Denies suicidal  Ideation   Problem: Safety: Goal: Ability to remain free from injury will improve Outcome: Progressing No injuries this shift   Problem: Activity: Goal: Sleeping patterns will improve Outcome: Progressing Voice  No concerns around sleep.

## 2017-03-28 NOTE — Tx Team (Signed)
Interdisciplinary Treatment and Diagnostic Plan Update  03/28/2017 Time of Session: 1110am Bryan Farrell MRN: [id number]  Principal Diagnosis: Major depressive disorder, recurrent severe without psychotic features (HCC)  Secondary Diagnoses: Principal Problem:   Major depressive disorder, recurrent severe without psychotic features (HCC) Active Problems:   Vascular dementia   Suicidal ideation   Homelessness   Diabetes (HCC)   Vitamin B12 deficiency   Current Medications:  Current Facility-Administered Medications  Medication Dose Route Frequency Provider Last Rate Last Dose  . acetaminophen (TYLENOL) tablet 650 mg  650 mg Oral Q6H PRN Jimmy FootmanHernandez-Gonzalez, Andrea, MD      . alum & mag hydroxide-simeth (MAALOX/MYLANTA) 200-200-20 MG/5ML suspension 30 mL  30 mL Oral Q4H PRN Jimmy FootmanHernandez-Gonzalez, Andrea, MD      . feeding supplement (GLUCERNA SHAKE) (GLUCERNA SHAKE) liquid 237 mL  237 mL Oral TID BM Pucilowska, Jolanta B, MD   237 mL at 03/27/17 2000  . FLUoxetine (PROZAC) capsule 20 mg  20 mg Oral QHS Pucilowska, Jolanta B, MD   20 mg at 03/27/17 2113  . hydrOXYzine (ATARAX/VISTARIL) tablet 25 mg  25 mg Oral TID PRN Jimmy FootmanHernandez-Gonzalez, Andrea, MD   25 mg at 03/25/17 2134  . insulin aspart (novoLOG) injection 0-5 Units  0-5 Units Subcutaneous QHS Pucilowska, Jolanta B, MD      . insulin aspart (novoLOG) injection 0-9 Units  0-9 Units Subcutaneous TID WC Pucilowska, Jolanta B, MD   2 Units at 03/28/17 0746  . magnesium hydroxide (MILK OF MAGNESIA) suspension 30 mL  30 mL Oral Daily PRN Jimmy FootmanHernandez-Gonzalez, Andrea, MD      . metFORMIN (GLUCOPHAGE) tablet 500 mg  500 mg Oral BID WC Pucilowska, Jolanta B, MD   500 mg at 03/28/17 0747  . traZODone (DESYREL) tablet 50 mg  50 mg Oral QHS PRN Jimmy FootmanHernandez-Gonzalez, Andrea, MD   50 mg at 03/25/17 2134  . vitamin B-12 (CYANOCOBALAMIN) tablet 1,000 mcg  1,000 mcg Oral Daily Pucilowska, Jolanta B, MD   1,000 mcg at 03/28/17 0747   PTA Medications: No  prescriptions prior to admission.    Patient Stressors: Financial difficulties Medication change or noncompliance  Patient Strengths: Ability for insight Capable of independent living  Treatment Modalities: Medication Management, Group therapy, Case management,  1 to 1 session with clinician, Psychoeducation, Recreational therapy.   Physician Treatment Plan for Primary Diagnosis: Major depressive disorder, recurrent severe without psychotic features (HCC) Long Term Goal(s): Improvement in symptoms so as ready for discharge NA   Short Term Goals: Ability to identify changes in lifestyle to reduce recurrence of condition will improve Ability to verbalize feelings will improve Ability to disclose and discuss suicidal ideas Ability to demonstrate self-control will improve Ability to identify and develop effective coping behaviors will improve Ability to maintain clinical measurements within normal limits will improve Compliance with prescribed medications will improve Ability to identify triggers associated with substance abuse/mental health issues will improve NA  Medication Management: Evaluate patient's response, side effects, and tolerance of medication regimen.  Therapeutic Interventions: 1 to 1 sessions, Unit Group sessions and Medication administration.  Evaluation of Outcomes: Adequate for Discharge  Physician Treatment Plan for Secondary Diagnosis: Principal Problem:   Major depressive disorder, recurrent severe without psychotic features (HCC) Active Problems:   Vascular dementia   Suicidal ideation   Homelessness   Diabetes (HCC)   Vitamin B12 deficiency  Long Term Goal(s): Improvement in symptoms so as ready for discharge NA   Short Term Goals: Ability to identify changes in lifestyle to reduce  recurrence of condition will improve Ability to verbalize feelings will improve Ability to disclose and discuss suicidal ideas Ability to demonstrate self-control will  improve Ability to identify and develop effective coping behaviors will improve Ability to maintain clinical measurements within normal limits will improve Compliance with prescribed medications will improve Ability to identify triggers associated with substance abuse/mental health issues will improve NA     Medication Management: Evaluate patient's response, side effects, and tolerance of medication regimen.  Therapeutic Interventions: 1 to 1 sessions, Unit Group sessions and Medication administration.  Evaluation of Outcomes: Adequate for Discharge   RN Treatment Plan for Primary Diagnosis: Major depressive disorder, recurrent severe without psychotic features (HCC) Long Term Goal(s): Knowledge of disease and therapeutic regimen to maintain health will improve  Short Term Goals: Ability to disclose and discuss suicidal ideas, Ability to identify and develop effective coping behaviors will improve and Compliance with prescribed medications will improve  Medication Management: RN will administer medications as ordered by provider, will assess and evaluate patient's response and provide education to patient for prescribed medication. RN will report any adverse and/or side effects to prescribing provider.  Therapeutic Interventions: 1 on 1 counseling sessions, Psychoeducation, Medication administration, Evaluate responses to treatment, Monitor vital signs and CBGs as ordered, Perform/monitor CIWA, COWS, AIMS and Fall Risk screenings as ordered, Perform wound care treatments as ordered.  Evaluation of Outcomes: Adequate for Discharge   LCSW Treatment Plan for Primary Diagnosis: Major depressive disorder, recurrent severe without psychotic features (HCC) Long Term Goal(s): Safe transition to appropriate next level of care at discharge, Engage patient in therapeutic group addressing interpersonal concerns.  Short Term Goals: Engage patient in aftercare planning with referrals and resources,  Increase social support and Increase skills for wellness and recovery  Therapeutic Interventions: Assess for all discharge needs, 1 to 1 time with Social worker, Explore available resources and support systems, Assess for adequacy in community support network, Educate family and significant other(s) on suicide prevention, Complete Psychosocial Assessment, Interpersonal group therapy.  Evaluation of Outcomes: Adequate for Discharge    Recreational Therapy Treatment Plan for Primary Diagnosis: Major depressive disorder, recurrent severe without psychotic features (HCC) Long Term Goal(s): Patient will participate in recreation therapy treatment in at least 2 group sessions without prompting from LRT  Short Term Goals: Increase communication skills, Increase self-expression skills  Treatment Modalities: Group Therapy and Individual Treatment Sessions  Therapeutic Interventions: Psychoeducation  Evaluation of Outcomes: Progressing   Progress in Treatment: Attending groups: Yes. Participating in groups: Yes. Taking medication as prescribed: Yes. Toleration medication: Yes. Family/Significant other contact made: Yes, individual(s) contacted:  attempts made Patient understands diagnosis: Yes. Discussing patient identified problems/goals with staff: Yes. Medical problems stabilized or resolved: No. Denies suicidal/homicidal ideation: Yes. Issues/concerns per patient self-inventory: No. Other: n/a  New problem(s) identified: None identified   New Short Term/Long Term Goal(s): "I'm not really sure why I'm here". Patient was unable to state a clear goal. Patient is very confused and has difficulty remembering things.   Reason for Continuation of Hospitalization: Medication stabilization Other; describe confusion and memory issues.   Estimated Length of Stay: 1 day  Attendees: Patient:  03/28/2017   Physician: Dr. Kristine Linea, MD 03/28/2017   Nursing: Hulan Amato, RN 03/28/2017    RN Care Manager: 03/28/2017   Social Worker: Daleen Squibb MSW, LCSW 03/28/2017   Recreational Therapist: Jacquelynn Cree, LRT/CTRS 03/28/2017   Other: Lorna Few, chaplain 03/28/2017   Other:  03/28/2017   Other: 03/28/2017  Scribe for Treatment Team: Wyn Quaker 03/28/2017 11:46 AM

## 2017-03-28 NOTE — [facility] Group Notes (Signed)
[facility] Group Notes:  (Nursing/MHT/Case Management/Adjunct)  Date:  03/28/2017  Time:  10:14 PM  Type of Therapy:  Group Therapy  Participation Level:  Active  Participation Quality:  Sharing  Affect:  Lethargic  Cognitive:  Alert  Insight:  Good  Engagement in Group:  Engaged  Modes of Intervention:  Activity  Summary of Progress/Problems:  Bryan Farrell 03/28/2017, 10:14 PM

## 2017-03-28 NOTE — Progress Notes (Signed)
Questioned  Patient  Where was he going  Patient  With raised voice  " I don't know where I am going . My family turned their backs on me . I have no where to go My kids just need to be kids "" Bad body odor  Isolates to room . Patient easily agitated .No auditory hallucinations  No pain concerns . Appropriate ADL'S. Interacting with peers and staff.  A: Encourage patient participation with unit programming . Instruction  Given on  Medication , verbalize understanding. R: Voice no other concerns. Staff continue to monitor

## 2017-03-28 NOTE — [facility] Suicide Risk Assessment (Signed)
[facility] INPATIENT:  Family/Significant Other Suicide Prevention Education  Suicide Prevention Education:  Family/Significant Other Refusal to Support Patient after Discharge:  Suicide Prevention Education Not Provided:  Patient has identified home of family/significant other as the place the patient will be residing after discharge.  With written consent of the patient, attempts were made to provide Suicide Prevention Education Bryan Farrell [phone]) .  This person indicates he/she will not be responsible for the patient after discharge.  CSW did reach pt daughter listed above.  She indicated she has not spoken to her father in 3 years due to some past family matters.  She is not willing to commit to contact and support for her father.  She has been in contact with her mother, pt's ex-wife and will discuss whether they can provide some financial assistance to pt so that he has a housing option.  CSW informed her that pt will be discharging tomorrow morning and she said she will call back prior to this.  Bryan Farrell, Bryan Fairbairn Jon, LCSW 03/28/2017,3:13 PM

## 2017-03-28 NOTE — Plan of Care (Signed)
Problem: Activity: Goal: Sleeping patterns will improve Outcome: Progressing Patient slept for Estimated Hours of 4.45; Precautionary checks every 15 minutes for safety maintained, room free of safety hazards, patient sustains no injury or falls during this shift.    

## 2017-03-28 NOTE — Plan of Care (Signed)
Problem: [facility] Participation in Recreation Therapeutic Interventions Goal: STG-Other Recreation Therapy Goal (Specify) STG: Communication - Within 3 treatment sessions, patient will participate in communication activity in one treatment session to increase healthy communication skills.  Outcome: Not Applicable Date Met: 92/17/83 At approximately 240 pm, LRT attempted treatment session. Patient refused stating there was no point because he was going to be "kicked out in the morning".  Leonette Monarch, LRT/CTRS 08.15.18 3:59 pm  Problem: [facility] Participation in Recreation Therapeutic Interventions Goal: STG-Other Recreation Therapy Goal (Specify) STG: Self-expression - Within 3 treatment sessions, patient will verbalize understanding ways to expression his emotions in one treatment session to increase his self-expression skills.  Outcome: Not Applicable Date Met: 75/42/37 At approximately 240 pm, LRT attempted treatment session. Patient refused stating there was no point because he was going to be "kicked out in the morning".  Leonette Monarch, LRT/CTRS 08.15.18 3:59 pm

## 2017-03-29 LAB — GLUCOSE, CAPILLARY
GLUCOSE-CAPILLARY: 169 mg/dL — AB (ref 65–99)
GLUCOSE-CAPILLARY: 220 mg/dL — AB (ref 65–99)

## 2017-03-29 NOTE — BHH Group Notes (Signed)
BHH LCSW Group Therapy Note  Date/Time: 03/29/17, 1300  Type of Therapy/Topic:  Group Therapy:  Balance in Life  Participation Level:  active  Description of Group:    This group will address the concept of balance and how it feels and looks when one is unbalanced. Patients will be encouraged to process areas in their lives that are out of balance, and identify reasons for remaining unbalanced. Facilitators will guide patients utilizing problem- solving interventions to address and correct the stressor making their life unbalanced. Understanding and applying boundaries will be explored and addressed for obtaining  and maintaining a balanced life. Patients will be encouraged to explore ways to assertively make their unbalanced needs known to significant others in their lives, using other group members and facilitator for support and feedback.  Therapeutic Goals: 1. Patient will identify two or more emotions or situations they have that consume much of in their lives. 2. Patient will identify signs/triggers that life has become out of balance:  3. Patient will identify two ways to set boundaries in order to achieve balance in their lives:  4. Patient will demonstrate ability to communicate their needs through discussion and/or role plays  Summary of Patient Progress: Pt was one of only two group members today.  Pt very angry at his family for not inviting him to stay with them despite his need for housing.  CSW and pt discussed pt options for handling this situation and attempting to resolve the conflict with his family.  CSW tied the discussion to other relevant areas out of balance for this pt: financial, housing.           Therapeutic Modalities:   Cognitive Behavioral Therapy Solution-Focused Therapy Assertiveness Training  Daleen SquibbGreg Cherylee Rawlinson, [place]

## 2017-03-29 NOTE — [facility] Suicide Risk Assessment (Signed)
[facility]   Principal Problem: Major depressive disorder, recurrent severe without psychotic features [facility](HCC) Discharge Diagnoses:  Patient Active Problem List   Diagnosis Date Noted  . Vitamin B12 deficiency [E53.8] 03/25/2017  . Vascular dementia [F01.50] 03/23/2017  . Suicidal ideation [R45.851] 03/23/2017  . Homelessness [Z59.0] 03/23/2017  . Diabetes (HCC) [E11.9] 03/23/2017  . Major depressive disorder, recurrent severe without psychotic features (HCC) [F33.2] 03/22/2017    Total Time spent with patient: 30 minutes  Musculoskeletal: Strength & Muscle Tone: within normal limits Gait & Station: normal Patient leans: N/A  Psychiatric Specialty Exam: Review of Systems  Psychiatric/Behavioral: Positive for depression.  All other systems reviewed and are negative.   Blood pressure 108/74, pulse 97, temperature 98.3 F (36.8 C), resp. rate 16, height 6' (1.829 m), weight 76.7 kg (169 lb), SpO2 98 %.Body mass index is 22.92 kg/m.  General Appearance: Fairly Groomed  Patent attorneyye Contact::  Good  Speech:  Clear and Coherent409  Volume:  Normal  Mood:  Angry  Affect:  Congruent  Thought Process:  Goal Directed and Descriptions of Associations: Intact  Orientation:  Full (Time, Place, and Person)  Thought Content:  WDL  Suicidal Thoughts:  No  Homicidal Thoughts:  No  Memory:  Immediate;   Fair Recent;   Fair Remote;   Fair  Judgement:  Poor  Insight:  Shallow  Psychomotor Activity:  Normal  Concentration:  Fair  Recall:  FiservFair  Fund of Knowledge:Fair  Language: Fair  Akathisia:  No  Handed:  Right  AIMS (if indicated):     Assets:  Communication Skills Desire for Improvement Resilience  Sleep:  Number of Hours: 4.45  Cognition: WNL  ADL's:  Intact   Mental Status Per Nursing Assessment::   On Admission:     Demographic Factors:  Male, Divorced or widowed, Caucasian, Low socioeconomic status and Unemployed  Loss Factors: Decline in  physical health and Financial problems/change in socioeconomic status  Historical Factors: Impulsivity  Risk Reduction Factors:   NA  Continued Clinical Symptoms:  Depression:   Hopelessness  Cognitive Features That Contribute To Risk:  None    Suicide Risk:  Minimal: No identifiable suicidal ideation.  Patients presenting with no risk factors but with morbid ruminations; may be classified as minimal risk based on the severity of the depressive symptoms    Plan Of [place] tolerated.ollow-up recommendations:  Activity:  as tolerated. Diet:  low sodium heart healthy ADA diet. Other:  keep follow up appointments.  Kristine LineaJolanta Albany Winslow, MD 03/29/2017, 8:24 AM

## 2017-03-29 NOTE — Discharge Summary (Signed)
Physician Discharge Summary Note  Patient:  Bryan Farrell is an 66 y.o., male MRN:  [id number] DOB:  August 18, 1950 Patient phone:  [phone] (home)  Patient address:   [address] [place] [address],  Total Time spent with patient: 30 minutes  Date of Admission:  03/22/2017 Date of Discharge: 03/29/2017  Reason for Admission:  Suicidal ideation.  Identifying data. Bryan Farrell is a 66 year old male with a history of depression and cognitive decline.  Chief complaint. "I said I was suicidal."  History of present illness. Information was obtained from the patient and the chart. The patient himself is that he is not able to provide much information. He mostly answers or I don't remember. The patient showed up at [facility] Emergency room on August 4 complaining of headache and dizziness. It was felt that the patient was hungry. His potassium was low and it was replenished. At that time the patient was not evaluated by mental health services. He was reportedly said [facility] for treatment. She retired in 24 hours to the emergency room from [place] this time suicidal. The patient himself is very unsure. He thinks that he could have said something about being suicidal out of frustration. He does not remember what he said is to. Apparently the patient has been staying at the [facility] in [place] for a period of time until his money from inheritance ran out. He was evicted from the hotel and started living in his car that was parked in a motel parking lot. He does not remember how long ago this happened. He has been going to nearby soup kitchen. He has been trying to reach his children, the son and the daughter, who lives in [place] area with no success. He feels abandoned and very pessimistic. He became increasingly weak as he lost weight and while at [facility] emergency room he fell in the shower. X-ray of his right wrist shows no fracture or dislocation by a brace was lately on the  wrist. The patient had difficulties getting into a chair and getting up due to weakness. He seems to have cognitive problems and very poor recent as well as remote memory and has not been able to answer most of our questions. He reports many symptoms of depression with poor sleep, decreased appetite, weight loss, anhedonia, guilt and hopelessness worthlessness, poor energy and concentration, social isolation, crying spells and suicidal thinking without plan. He denies psychotic symptoms or symptoms suggestive of bipolar mania. He does not report heightened anxiety. There is no alcohol or drug problems.   Past psychiatric history. There is one suicide attempt by hanging in 2011 and another psychiatric admission last year for depression and suicidal ideation. He has not follow up with outpatient provider.   Family psychiatric history. Unknown.  Social history. As above. He has not been able to provide much information due to cognitive decline.  Principal Problem: Major depressive disorder, recurrent severe without psychotic features [facility]) Discharge Diagnoses: Patient Active Problem List   Diagnosis Date Noted  . Vitamin B12 deficiency [E53.8] 03/25/2017  . Vascular dementia [F01.50] 03/23/2017  . Suicidal ideation [R45.851] 03/23/2017  . Homelessness [Z59.0] 03/23/2017  . Diabetes (HCC) [E11.9] 03/23/2017  . Major depressive disorder, recurrent severe without psychotic features (HCC) [F33.2] 03/22/2017    Past Medical History:  Past Medical History:  Diagnosis Date  . Diabetes mellitus without complication [facility])     Past Surgical History:  Procedure Laterality Date  . TONSILLECTOMY  Family History: History reviewed. No pertinent family history.  Social History:  History  Alcohol Use No     History  Drug Use No    Social History   Social History  . Marital status: Single    Spouse name: N/A  . Number of children: N/A  . Years of education: N/A   Social History Main  Topics  . Smoking status: Never Smoker  . Smokeless tobacco: Never Used  . Alcohol use No  . Drug use: No  . Sexual activity: Not Asked   Other Topics Concern  . None   Social History Narrative  . None    Hospital Course:    Bryan Farrell is a 66 year old male with a history of depression admitted for suicidal ideation in the context of severe social stressors and lack of support.  1. Suicidal ideation. Resolved. The patient denies thoughts, intention or plans to hurt himself or others.   2. Mood. We started Prozac for depression.  3. Insomnia. Trazodone was available.  4. Diabetes. We started Metformin, ADA diet and SSI. Input from Diabetes Nurse Coordinator is greatly appreciated.   5. Metabolic syndrome monitoring. Lipid panel shows elevated triglycerides, normal TSH and hemoglobin A1c of 9.8.   6. EKG. QTC 501.  7. Cognitive decline. Head CT scan shows vascular changes. RPR nonreactive.  8. Falls. The patient fell in the shower in the ER. Wrist X-ray negative. He wears a brace. PT assessment is greatly appreciated. The patient is not interested in PT.  9. Vit B12 defficiancy. We started supplementation.  10. Weight loss. We offered Glucerna.  11. Social. We were unable to help the patient contact his children. His ex-wife, now in [place], refused to give information. She may be able to help the patient in the future.    12. Disposition. The patient refuses to go to the homeless shelter. He was discharged to his [facility]. [facility]  Physical Findings: AIMS: Facial and Oral Movements Muscles of Facial Expression: None, normal Lips and Perioral Area: None, normal Jaw: None, normal Tongue: None, normal,Extremity Movements Upper (arms, wrists, hands, fingers): None, normal Lower (legs, knees, ankles, toes): None, normal, Trunk Movements Neck, shoulders, hips: None, normal, Overall Severity Severity of abnormal movements (highest score from  questions above): None, normal Incapacitation due to abnormal movements: None, normal Patient's awareness of abnormal movements (rate only patient's report): No Awareness, Dental Status Current problems with teeth and/or dentures?: Yes ("Most of my teeth has fallen out." does not wear dentures) Does patient usually wear dentures?: No  CIWA:  CIWA-Ar Total: 0 COWS:     Musculoskeletal: Strength & Muscle Tone: within normal limits Gait & Station: normal Patient leans: N/A  Psychiatric Specialty Exam: Physical Exam  Nursing note and vitals reviewed. Psychiatric: His speech is normal and behavior is normal. Thought content normal. His mood appears anxious. Cognition and memory are normal. He expresses impulsivity.    Review of Systems  Musculoskeletal: Positive for joint pain.  All other systems reviewed and are negative.   Blood pressure 108/74, pulse 97, temperature 98.3 F (36.8 C), resp. rate 16, height 6' (1.829 m), weight 76.7 kg (169 lb), SpO2 98 %.Body mass index is 22.92 kg/m.  General Appearance: Fairly Groomed  Eye Contact:  Good  Speech:  Clear and Coherent  Volume:  Normal  Mood:  Angry  Affect:  Appropriate  Thought Process:  Goal Directed and Descriptions of Associations: Intact  Orientation:  Full (Time, Place, and  Person)  Thought Content:  WDL  Suicidal Thoughts:  No  Homicidal Thoughts:  No  Memory:  Immediate;   Fair Recent;   Fair Remote;   Fair  Judgement:  Impaired  Insight:  Shallow  Psychomotor Activity:  Normal  Concentration:  Concentration: Fair and Attention Span: Fair  Recall:  F[employer] of Knowledge:  Fair  Language:  Fair  Akathisia:  No  Handed:  Right  AIMS (if indicated):     Assets:  Communication Skills Desire for Improvement Resilience Social Support  ADL's:  Intact  Cognition:  WNL  Sleep:  Number of Hours: 4.45     Have you used any form of tobacco in the last 30 days? (Cigarettes, Smokeless Tobacco, Cigars, and/or  Pipes): No  Has this patient used any form of tobacco in the last 30 days? (Cigarettes, Smokeless Tobacco, Cigars, and/or Pipes) Yes, No  Blood Alcohol level:  Lab Results  Component Value Date   ETH <5 03/18/2017   ETH <11 04/20/2014    Metabolic Disorder Labs:  No results found for: HGBA1C, MPG No results found for: PROLACTIN Lab Results  Component Value Date   CHOL 200 03/24/2017   TRIG 156 (H) 03/24/2017   HDL 34 (L) 03/24/2017   CHOLHDL 5.9 03/24/2017   VLDL 31 03/24/2017   LDLCALC 135 (H) 03/24/2017    See Psychiatric Specialty Exam and Suicide Risk Assessment completed by Attending Physician prior to discharge.  Discharge destination:  Home  Is patient on multiple antipsychotic therapies at discharge:  No   Has Patient had three or more failed trials of antipsychotic monotherapy by history:  No  Recommended Plan for Multiple Antipsychotic Therapies: NA  Discharge Instructions    Diet - low sodium heart healthy    Complete by:  As directed    Increase activity slowly    Complete by:  As directed      Allergies as of 03/29/2017   No Known Allergies     Medication List    TAKE these medications     Indication  cyanocobalamin 1000 MCG tablet Take 1 tablet (1,000 mcg total) by mouth daily.  Indication:  Inadequate Vitamin B12   FLUoxetine 20 MG capsule Commonly known as:  PROZAC Take 1 capsule (20 mg total) by mouth at bedtime.  Indication:  Major Depressive Disorder   metFORMIN 500 MG tablet Commonly known as:  GLUCOPHAGE Take 1 tablet (500 mg total) by mouth 2 (two) times daily with a meal.  Indication:  Type 2 Diabetes   traZODone 100 MG tablet Commonly known as:  DESYREL Take 1 tablet (100 mg total) by mouth at bedtime.  Indication:  Trouble Sleeping        Follow-up recommendations:  Activity:  as tolerated. Diet:  low sodium heart healthy ADA dit. Other:  keep follow up appointments.  Comments:     Signed: Kristine Linea,  MD 03/29/2017, 8:24 AM

## 2017-03-29 NOTE — Progress Notes (Signed)
Pt is in irritable mood refused all medications and insulin.

## 2017-03-29 NOTE — Progress Notes (Signed)
Recreation Therapy Notes  Date: 08.16.18 Time: 9:30 am Location: Craft Room  Group Topic: Leisure Education  Goal Area(s) Addresses:  Patient will identify things they are grateful for. Patient will identify how being grateful can influence decision making.  Behavioral Response: Attentive, Interactive  Intervention: Grateful Wheel  Activity: Patients were given an I Am Grateful For worksheet and were instructed to write things they are grateful for under each category.  Education: LRT educated patients on leisure.  Education Outcome: Acknowledges education/In group clarification offered  Clinical Observations/Feedback: Patient wrote things he is grateful for. Patient contributed to group discussion by stating things he is grateful for and that he has a hard time participating in leisure activities.  Jacquelynn CreeGreene,Kerrie Latour M, LRT/CTRS 03/29/2017 10:06 AM

## 2017-03-29 NOTE — Progress Notes (Signed)
Patient ID: Pearlie Oysteronald Lewis Tschida, male   DOB: 1951/07/12, 66 y.o.   MRN: [id number]  CSW received call from patient's ex-wife, Learta CoddingSunny [phone]. Ex-wife was calling to speak to patient about providing him financial assistance [place]. CSW brought phone to patient so that he could speak to her. Patient was extremely irritable and loud with her and began cussing. Ex-wife continued to ask patient how she can help him financially however patient continued to yell at her saying "No one really cares about me" & "Why can't my children help me?". After several minutes wife told patient that if he wanted her to help him, to reach out to her once [place] back [place] to his [employer]. Patient then hung up the phone saying "No one wants to help me!". CSW attempted to discuss shelter options with patient. Patient became irritable again saying, "My kids should let me stay with them, you should have called them". CSW explained that staff had spoken to his children who were not willing to provide housing for patient at this time. Patient then stated he was not going to a shelter. Patient is refusing any additional assistance at this time. Patient had no further questions for CSW at this time.   Myrel Rappleye G. Garnette CzechSampson MSW, LCSWA 03/29/2017 1:42 PM

## 2017-03-29 NOTE — Progress Notes (Signed)
Pt left BMU unit safely at 1630 pm

## 2017-03-29 NOTE — BHH Group Notes (Signed)
  BHH LCSW Group Therapy Note  Date/Time: 03/28/17, 1300  Type of Therapy/Topic:  Group Therapy:  Emotion Regulation  Participation Level:  Active   Mood: frustrated  Description of Group:    The purpose of this group is to assist patients in learning to regulate negative emotions and experience positive emotions. Patients will be guided to discuss ways in which they have been vulnerable to their negative emotions. These vulnerabilities will be juxtaposed with experiences of positive emotions or situations, and patients challenged to use positive emotions to combat negative ones. Special emphasis will be placed on coping with negative emotions in conflict situations, and patients will process healthy conflict resolution skills.  Therapeutic Goals: 1. Patient will identify two positive emotions or experiences to reflect on in order to balance out negative emotions:  2. Patient will label two or more emotions that they find the most difficult to experience:  3. Patient will be able to demonstrate positive conflict resolution skills through discussion or role plays:   Summary of Patient Progress: Pt attended group but was distracted by his impending discharge.  Pt did identify anger as a difficult emotion for him.  Pt did not contribute to group discussion.  CSW spoke with pt individually after group to further process his discharge situation.       Therapeutic Modalities:   Cognitive Behavioral Therapy Feelings Identification Dialectical Behavioral Therapy  Daleen SquibbGreg Azariya Freeman, LCSW

## 2017-03-29 NOTE — Progress Notes (Signed)
Pt denies SI, HI, a/v hallucinations. Pt commits to safety for discharge. Follow up appointments, discharge medications, and treatment reviewed. Pt verbalized understanding of discharge instructions. All belongings returned to pt upon discharge. Pt discharged safely via curtesy vehicle and given two bus passes. Pt given directions to car in [place] and charged cell phone.

## 2017-03-29 NOTE — Progress Notes (Signed)
Patient ID: Bryan Farrell, male   DOB: 10/01/1950, 66 y.o.   MRN: [id number] Pleasant on approach in his room, angry, sad, helpless, "I came here on attempted suicide, and they planning to discharge me back to the shelter tomorrow; yes, I have 2 adult children, I don't remember the last time I talked to my daughter and my son came here once and we argued and never heard from him since them. My ex wife is the only person that cares but she is [place] in [place].." Loud volume of voice, frustrated, medication compliant.

## 2017-03-29 NOTE — Progress Notes (Signed)
CSW received call from pt son, Iantha FallenKenneth, who reports pt has long history of making bad choices regarding his living situation.  Iantha FallenKenneth is unable to allow pt to stay with him and frustrated overall that pt has not made better choices.  Iantha FallenKenneth reports that at one time pt had $400,000 and could have bought a house but chose to stay in a hotel until all his money was gone.  Iantha FallenKenneth will communicate with his mother and sister about what they are going to do. Garner NashGregory Madellyn Denio, MSW, LCSW Clinical Social Worker 03/29/2017 3:41 PM

## 2017-03-29 NOTE — Progress Notes (Signed)
  [facility] :  Will you be returning to the same living situation after discharge:  Yes,  patient will return to hotel where his [employer] is located.  At discharge, do you have transportation home?: Yes,  bus pass Do you have the ability to pay for your medications: Yes,  [facility].   Release of information consent forms completed and in the chart;  Patient's signature needed at discharge.  Patient to Follow up at: Follow-up Information    [facility] Follow up on 04/03/2017.   Specialty:  Behavioral Health Why:  Follow-up appointment on this date with [facility] for outpatient services. Bring discharge summary, current medications, and photo I.D. with you this appointment.  Contact information: [address] [place] [address] [phone]           Next level of care provider has [facility]:no  Safety Planning and Suicide Prevention discussed: Yes,  with patient  Have you used any form of tobacco in the last 30 days? (Cigarettes, Smokeless Tobacco, Cigars, and/or Pipes): No  Has patient been referred to the Quitline?: N/A patient is not a smoker  Patient has been referred for addiction treatment: N/A  Bryan Farrell, LCSWA 03/29/2017, 1:43 PM

## 2017-03-29 NOTE — BHH Group Notes (Signed)
BHH Group Notes:  (Nursing/MHT/Case Management/Adjunct)  Date:  03/29/2017  Time:  4:04 PM  Type of Therapy:  Psychoeducational Skills  Participation Level:  Active  Participation Quality:  Appropriate, Attentive, Sharing and Supportive  Affect:  Flat  Cognitive:  Appropriate  Insight:  Good  Engagement in Group:  Engaged  Modes of Intervention:  Discussion and Education  Summary of Progress/Problems:  Bryan Farrell 03/29/2017, 4:04 PM

## 2017-03-29 NOTE — Progress Notes (Signed)
Patient ID: [name], male   DOB: 10-Nov-1950, 66 y.o.   MRN: [id number]  10:04am CSW contacted [facility] to scheduled follow-up appointment for patient. Receptionist Nicky at [facility] transformed patient to clinician Trula OreChristina with [facility] to schedule appointment, No answer, CSW left voicemail asking for a returned call to schedule patient's appointment before he is discharged. CSW will follow-up again in attempt to schedule appointment.  10:44am CSW contacted [facility] to scheduled follow-up appointment for patient. CSW spoke with Receptionist Britt BoozerNicky at [facility] again about scheduling follow-up appointment for patient. Receptionist stated that patient's file had been closed due to missing several appointments in the past. Informed CSW that patient would have to be scheduled to complete another initial assessment. Stated that a clinician would contact CSW as soon as possible to schedule appointment.   Raylin Diguglielmo G. Garnette CzechSampson MSW, LCSWA 03/29/2017 11:00 AM

## 2017-03-30 ENCOUNTER — Emergency Department (HOSPITAL_COMMUNITY): Payer: No Typology Code available for payment source

## 2017-03-30 ENCOUNTER — Emergency Department (HOSPITAL_COMMUNITY)
Admission: EM | Admit: 2017-03-30 | Discharge: 2017-03-30 | Disposition: A | Discharge status: Home / Self Care | Payer: No Typology Code available for payment source | Attending: Emergency Medicine | Admitting: Emergency Medicine

## 2017-03-30 ENCOUNTER — Encounter (HOSPITAL_COMMUNITY): Payer: Self-pay | Admitting: Emergency Medicine

## 2017-03-30 DIAGNOSIS — F0390 Unspecified dementia without behavioral disturbance: Secondary | ICD-10-CM | POA: Insufficient documentation

## 2017-03-30 DIAGNOSIS — R45851 Suicidal ideations: Secondary | ICD-10-CM | POA: Insufficient documentation

## 2017-03-30 DIAGNOSIS — E119 Type 2 diabetes mellitus without complications: Secondary | ICD-10-CM | POA: Insufficient documentation

## 2017-03-30 DIAGNOSIS — R410 Disorientation, unspecified: Secondary | ICD-10-CM | POA: Insufficient documentation

## 2017-03-30 DIAGNOSIS — Z7984 Long term (current) use of oral hypoglycemic drugs: Secondary | ICD-10-CM | POA: Insufficient documentation

## 2017-03-30 LAB — COMPREHENSIVE METABOLIC PANEL
ALT: 14 U/L — ABNORMAL LOW (ref 17–63)
AST: 19 U/L (ref 15–41)
Albumin: 4.2 g/dL (ref 3.5–5.0)
Alkaline Phosphatase: 56 U/L (ref 38–126)
Anion gap: 12 (ref 5–15)
BUN: 19 mg/dL (ref 6–20)
CHLORIDE: 99 mmol/L — AB (ref 101–111)
CO2: 23 mmol/L (ref 22–32)
Calcium: 9.8 mg/dL (ref 8.9–10.3)
Creatinine, Ser: 1.05 mg/dL (ref 0.61–1.24)
Glucose, Bld: 257 mg/dL — ABNORMAL HIGH (ref 65–99)
POTASSIUM: 4.4 mmol/L (ref 3.5–5.1)
SODIUM: 134 mmol/L — AB (ref 135–145)
Total Bilirubin: 1.7 mg/dL — ABNORMAL HIGH (ref 0.3–1.2)
Total Protein: 7.4 g/dL (ref 6.5–8.1)

## 2017-03-30 LAB — RAPID URINE DRUG SCREEN, HOSP PERFORMED
Amphetamines: NOT DETECTED
BARBITURATES: NOT DETECTED
Benzodiazepines: NOT DETECTED
COCAINE: NOT DETECTED
Opiates: NOT DETECTED
Tetrahydrocannabinol: NOT DETECTED

## 2017-03-30 LAB — URINALYSIS, ROUTINE W REFLEX MICROSCOPIC
BACTERIA UA: NONE SEEN
BILIRUBIN URINE: NEGATIVE
Glucose, UA: >=500 mg/dL — AB
Hgb urine dipstick: NEGATIVE
KETONES UR: 5 mg/dL — AB
LEUKOCYTES UA: NEGATIVE
Nitrite: NEGATIVE
PH: 6 (ref 5.0–8.0)
Protein, ur: NEGATIVE mg/dL
Specific Gravity, Urine: 1.014 (ref 1.005–1.030)

## 2017-03-30 LAB — CBC
HEMATOCRIT: 40.7 % (ref 39.0–52.0)
HEMOGLOBIN: 13.8 g/dL (ref 13.0–17.0)
MCH: 29.2 pg (ref 26.0–34.0)
MCHC: 33.9 g/dL (ref 30.0–36.0)
MCV: 86.2 fL (ref 78.0–100.0)
Platelets: 429 10*3/uL — ABNORMAL HIGH (ref 150–400)
RBC: 4.72 MIL/uL (ref 4.22–5.81)
RDW: 13.7 % (ref 11.5–15.5)
WBC: 9 10*3/uL (ref 4.0–10.5)

## 2017-03-30 LAB — SALICYLATE LEVEL: Salicylate Lvl: <7 mg/dL (ref 2.8–30.0)

## 2017-03-30 LAB — ACETAMINOPHEN LEVEL: Acetaminophen (Tylenol), Serum: <10 ug/mL — ABNORMAL LOW (ref 10–30)

## 2017-03-30 MED ORDER — SODIUM CHLORIDE 0.9 % IV BOLUS (SEPSIS)
1000.0000 mL | Freq: Once | INTRAVENOUS | Status: AC
  Administered 2017-03-30: 1000 mL via INTRAVENOUS

## 2017-03-30 NOTE — Progress Notes (Addendum)
Consult request has been received. CSW attempting to follow up at present time.  CSW reviewed notes. Pt has recently been D/C'd from a [facility] Medicine unit  [facility]) on 8/19 and pt's children refused to assist pt at D/C due to pt's "bad choices" regarding his living situation.  Per pt's son pt once had $400,00 and notes say this was from an inheritance, but chose to live in a hotel "until the money ran out".  Per notes, pt has a Merchant navy officer at the [employer] that he has been living in since he was evicted from the [facility] before his stay at [facility].  Per notes from [facility]  at D/C it was believed by the psychiatrist that the pt is not actually confused, as evidenced by, the pt being fully cognizant of all his activities, schedules and groups while inpatient at [facility] and able to ambulate throughout the unit during his daily schedule at [facility] without being lost or needing to be directed.    Per CSW's notes at [facility] pt refused D/C to a shelter insisting his children let him "stay with" them.  Per notes, children refused, but the pt's ex-wife in [place] asked the pt if she could help the pt financially but pt insisted, despite the social worker hearing the pt's ex-wife offer financial help, that "nobody wants to help me".  Pt was given a bus pass to [place] and a Barrister's clerk, per BMU notes.  CSW will continue to follow.   Dorothe Pea. Viyaan Champine, Francesco Sor, CSI Clinical Social Worker Ph: [phone]

## 2017-03-30 NOTE — ED Provider Notes (Signed)
[facility] DE[facility] Provider Note   CSN: [id number] Arrival date & time: 03/30/17  9528     History   Chief Complaint Chief Complaint  Patient presents with  . Altered Mental Status    HPI Bryan Farrell is a 66 y.o. male.  The history is provided by the patient and the EMS personnel.  Altered Mental Status   This is a new problem. The current episode started less than 1 hour ago. The problem has been rapidly improving. Associated symptoms include confusion. Pertinent negatives include no somnolence, no unresponsiveness, no weakness, no agitation, no delusions, no hallucinations and no self-injury. His past medical history is significant for diabetes and dementia. His past medical history does not include COPD or head trauma.  Mental Health Problem  Presenting symptoms: suicidal thoughts   Presenting symptoms: no agitation, no delusions, no hallucinations and no self-mutilation   Degree of incapacity (severity):  Mild Onset quality:  Gradual Duration:  1 day Timing:  Intermittent Progression:  Waxing and waning Chronicity:  Recurrent Associated symptoms: no abdominal pain, no appetite change, no chest pain, no fatigue and no headaches     Past Medical History:  Diagnosis Date  . Diabetes mellitus without complication [facility])     Patient Active Problem List   Diagnosis Date Noted  . Vitamin B12 deficiency 03/25/2017  . Vascular dementia 03/23/2017  . Suicidal ideation 03/23/2017  . Homelessness 03/23/2017  . Diabetes (HCC) 03/23/2017  . 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 TONSILLECTOMY         Home Medications    Prior to Admission medications   Medication Sig Start Date End Date Taking? Authorizing Provider  cyanocobalamin 1000 MCG tablet Take 1 tablet (1,000 mcg total) by mouth daily. 03/29/17   Pucilowska, Braulio Conte B, MD  FLUoxetine (PROZAC)  20 MG capsule Take 1 capsule (20 mg total) by mouth at bedtime. 03/28/17   Pucilowska, Braulio Conte B, MD  metFORMIN (GLUCOPHAGE) 500 MG tablet Take 1 tablet (500 mg total) by mouth 2 (two) times daily with a meal. 03/28/17   Pucilowska, Jolanta B, MD  traZODone (DESYREL) 100 MG tablet Take 1 tablet (100 mg total) by mouth at bedtime. 03/28/17   Shari Prows, MD    Family History No family history on file.  Social History Social History  Substance Use Topics  . Smoking status: Never Smoker  . Smokeless tobacco: Never Used  . Alcohol use No     Allergies   Patient has no known allergies.   Review of Systems Review of Systems  Constitutional: Negative for activity change, appetite change, chills, diaphoresis, fatigue and fever.  HENT: Negative for congestion and rhinorrhea.   Eyes: Negative for visual disturbance.  Respiratory: Negative for cough, chest tightness, shortness of breath, wheezing and stridor.   Cardiovascular: Negative for chest pain, palpitations and leg swelling.  Gastrointestinal: Negative for abdominal distention, abdominal pain, blood in stool, constipation, diarrhea, nausea and vomiting.  Genitourinary: Negative for difficulty urinating, dysuria, flank pain and frequency.  Musculoskeletal: Negative for back pain, gait problem, neck pain and neck stiffness.  Skin: Negative for rash and wound.  Neurological: Negative for dizziness, syncope, weakness, light-headedness, numbness and headaches.  Psychiatric/Behavioral: Positive for confusion and suicidal ideas. Negative for agitation, hallucinations and self-injury.  All other systems reviewed and are negative.    Physical Exam Updated Vital Signs BP (!) 141/92 (BP Location: Left Arm)  Pulse 84   Temp 98.4 F (36.9 C) (Oral)   Resp 17   Wt 76.7 kg (169 lb)   SpO2 99%   BMI 22.92 kg/m   Physical Exam  Constitutional: He appears well-developed and well-nourished.  HENT:  Head: Normocephalic and  atraumatic.  Mouth/Throat: Oropharynx is clear and moist. No oropharyngeal exudate.  Eyes: Pupils are equal, round, and reactive to light. Conjunctivae and EOM are normal.  Neck: Normal range of motion. Neck supple.  Cardiovascular: Normal rate and regular rhythm.   No murmur heard. Pulmonary/Chest: Effort normal and breath sounds normal. No stridor. No respiratory distress. He has no wheezes. He exhibits no tenderness.  Abdominal: Soft. There is no tenderness.  Musculoskeletal: He exhibits no edema or tenderness.  Neurological: He is alert. He is disoriented. He displays no tremor. No cranial nerve deficit or sensory deficit. He exhibits normal muscle tone. He displays no seizure activity. Coordination normal. GCS eye subscore is 4. GCS verbal subscore is 5. GCS motor subscore is 6.  Disoriented on arrival, improvement in orentation as time passed in ED.   Skin: Skin is warm and dry. Capillary refill takes less than 2 seconds. No erythema. No pallor.  Psychiatric: He has a normal mood and affect.  Nursing note and vitals reviewed.    ED Treatments / Results  Labs (all labs ordered are listed, but only abnormal results are displayed) Labs Reviewed  COMPREHENSIVE METABOLIC PANEL - Abnormal; Notable for the following:       Result Value   Sodium 134 (*)    Chloride 99 (*)    Glucose, Bld 257 (*)    ALT 14 (*)    Total Bilirubin 1.7 (*)    All other components within normal limits  CBC - Abnormal; Notable for the following:    Platelets 429 (*)    All other components within normal limits  URINALYSIS, ROUTINE W REFLEX MICROSCOPIC - Abnormal; Notable for the following:    Glucose, UA >=500 (*)    Ketones, ur 5 (*)    Squamous Epithelial / LPF 0-5 (*)    All other components within normal limits  ACETAMINOPHEN LEVEL - Abnormal; Notable for the following:    Acetaminophen (Tylenol), Serum <10 (*)    All other components within normal limits  RAPID URINE DRUG SCREEN, HOSP PERFORMED    SALICYLATE LEVEL  CBG MONITORING, ED    EKG  EKG Interpretation  Date/Time:  Friday March 30 2017 10:05:27 EDT Ventricular Rate:  82 PR Interval:    QRS Duration: 96 QT Interval:  406 QTC Calculation: 475 R Axis:   35 Text Interpretation:  Sinus rhythm Abnormal R-wave progression, early transition when compared to prior, no significant changes seen.  No STEMI Confirmed by Theda Belfast ([id number]) on 03/30/2017 3:08:03 PM       Radiology Dg Chest 2 View  Result Date: 03/30/2017 CLINICAL DATA:  Altered mental status. EXAM: CHEST  2 VIEW COMPARISON:  02/03/2010 FINDINGS: The heart size and mediastinal contours are within normal limits. Both lungs are clear. The visualized skeletal structures are unremarkable. IMPRESSION: No active cardiopulmonary disease. Electronically Signed   By: Signa Kell M.D.   On: 03/30/2017 11:18    Procedures Procedures (including critical care time)  Medications Ordered in ED Medications  sodium chloride 0.9 % bolus 1,000 mL (0 mLs Intravenous Stopped 03/30/17 1140)     Initial Impression / Assessment and Plan / ED Course  I have reviewed the triage vital signs and  the nursing notes.  Pertinent labs & imaging results that were available during my care of the patient were reviewed by me and considered in my medical decision making (see chart for details).     Bryan Farrell is a 66 y.o. male with a past medical history significant for homelessness, diabetes, vascular dementia, and  Suicidal ideation with depression who presents for confusion. According to EMS, patient was found wandering the parking lot of [place] [employer]. Patient did not know where he was going or what he was doing. He was brought in for evaluation. Patient says that he is having no physical symptoms including no fevers, chills, chest pain, shortness of breath, nausea, vomiting, constipation, diarrhea, or dysuria. He does however report that he is still having suicidal  ideation. He says that he tried to hang himself recently but cannot specify when. He said that he is still having some suicidal ideation but denies any homicidal ideation or audiovisual hallucinations.   On exam, patient was alert to person and place. He did not know the year. He could move all extremities with normal coordination. Normal finger-nose-finger. Pupils reactive with normal extra ocular movements. Normal speech. Lungs clear and abdomen is nontender.   Patient had diagnostic testing to rule out occult infection, dehydration, or other abnormalities. Patient's urinalysis showed glucose but no evidence of infection. UDS negative. CMP showed no evidence of DKA. Creatinine normal. Potassium nonelevated. Tylenol and slightly slight levels negative. No leukocytosis or anemia. Chest x-ray shows no pneumonia.  Given lack of evidence of trauma, do not feel patient needs CT head. Patient is now alert and oriented on reassessment.  Patient does continue to report that he is having suicidal ideation. Given his recent hanging attempt by report, patient wants to talk to the behavioral health team.   As patient's workup is reassuring, patient is medically cleared to talk to the TTS team. TTS consult placed. Anticipate following up on psychiatric recommendations.    Final Clinical Impressions(s) / ED Diagnoses   Final diagnoses:  Confusion  Suicidal ideation     Clinical Impression: 1. Confusion   2. Suicidal ideation     Disposition: Awaiting psychiatric recommendations for SI    Mayling Aber, Canary Brim, MD 03/31/17 [phone]

## 2017-03-30 NOTE — ED Provider Notes (Signed)
Pt  Was assessed by TTS and social work.  He does not require psychiatric admission.  Pt was assessed by social work and he has been offered outpatient resources in the past.  Pt is stable for discharge.   Linwood Dibbles, MD 03/30/17 Serena Croissant

## 2017-03-30 NOTE — ED Notes (Signed)
Pt offered bus pass and shelter resources.  Pt declined both stating "I have no where to go with a bus pass".  Pt states "i  Have issues with shelters" and "my family wont take me in".  Pt reassured no medical emergencies were present today that required stay in either the emergency department or the hospital.  Offered resources again, and declined.  Pt placed in wheelchair and taken to waiting room.

## 2017-03-30 NOTE — Progress Notes (Signed)
CSW spoke to the pt's RN who reported that TTS had spoke with the pt and had cleared the pt psychiatrically.    CSW spoke to the pt by phone and stated he had not yet stayed at the [facility] although notes stated he had reported being kicked out.  Several times during the conversation when asked about the previous few day pt had replied, "I don't know", but when confronted with information from the notes regarding pt's stay at [facility]'s [facility] and the fact his ex-wife is offering to help the pt financially, the pt had immediately replied as if he knew this information and stated, he "was not comfortable at this time", with letting his ex-wife help him out financially.    When asked about his Zenaida Niece the pt had immediately replied   Per notes pt has been diagnosed with vascular dementia, as evidenced by a CT scan.  CSW encouraged pt to allow his ex-wife to assist him, but pt again refused and CSW offered pt a shelter list.  Pt accepted the list via phone and faxed said list to pt's RN to be provided to the pt.  Please reconsult if future social work needs arise.  CSW signing off, as social work intervention is no longer needed.  Dorothe Pea. Elisha Cooksey, Francesco Sor, CSI Clinical Social Worker Ph: [phone]

## 2017-03-30 NOTE — BH Assessment (Signed)
Tele Assessment Note   Bryan Farrell is a 66 y.o. male who presents to [facility] voluntarily, accompanied by EMS, due to confusion. Pt was released from [facility] on yesterday after a week stay for treatment of depression. Pt was also, during this time, diagnosed with vascular dementia, as evidenced by a CT scan. Today, pt denies SI to clinician but admits to being "kind of depressed and unhappy". Clinician [facility] to [facility], but pt was unsure himself and, at one point, thought he was in the same hospital he was at yesterday ("I never left the hospital, I've been here"). Pt agrees that he is homeless, but is unsure where he slept last night. Pt doesn't appear to be able to function appropriately on his own due to his cognitive deficit.   Pt does not need psychiatric treatment, at this time, but it is believed that he would benefit from a SW consult to help with situational planning. Case was staffed with Hillery Jacks, FNP. Pt's RN, Windell Moulding, notified.    Diagnosis: Major vascular neurocognitive disorder, Probable, Without behavioral disturbance  Past Medical History:  Past Medical History:  Diagnosis Date  . Diabetes mellitus without complication [facility])     Past Surgical History:  Procedure Laterality Date  . TONSILLECTOMY      Family History: No family history on file.  Social History:  reports that he has never smoked. He has never used smokeless tobacco. He reports that he does not drink alcohol or use drugs.  Additional Social History:  Alcohol / Drug Use Pain Medications: SEE MAR Prescriptions: SEE MAR Over the Counter: SEE MAR History of alcohol / drug use?: No history of alcohol / drug abuse  CIWA: CIWA-Ar BP: (!) 148/78 Pulse Rate: 78 COWS:    PATIENT STRENGTHS: (choose at least two) Average or above average intelligence Motivation for treatment/growth  Allergies: No Known Allergies  Home Medications:  (Not in a hospital admission)  OB/GYN  Status:  No LMP for male patient.  General Assessment Data Assessment unable to be completed: Yes Reason for not completing assessment: see progress note Location of Assessment: [facility] ED TTS Assessment: In system Is this a Tele or Face-to-Face Assessment?: Tele Assessment Is this an Initial Assessment or a Re-assessment for this encounter?: Initial Assessment Marital status: Divorced Living Arrangements: Other (Comment) (homeless) Can pt return to current living arrangement?: Yes Admission Status: Voluntary Is patient capable of signing voluntary admission?: Yes Referral Source: Psychiatrist     Crisis Care Plan Living Arrangements: Other (Comment) (homeless) Name of Psychiatrist: none Name of Therapist: none  Education Status Is patient currently in school?: No  Risk to self with the past 6 months Suicidal Ideation: No-Not Currently/Within Last 6 Months Has patient been a risk to self within the past 6 months prior to admission? : No Suicidal Intent: No Has patient had any suicidal intent within the past 6 months prior to admission? : No Is patient at risk for suicide?: No Suicidal Plan?: No Has patient had any suicidal plan within the past 6 months prior to admission? : No Access to Means: No Previous Attempts/Gestures: No Suicide prevention information given to non-admitted patients: Yes  Risk to Others within the past 6 months Homicidal Ideation: No Does patient have any lifetime risk of violence toward others beyond the six months prior to admission? : No Thoughts of Harm to Others: No Current Homicidal Intent: No Current Homicidal Plan: No Access to Homicidal Means: No History of  harm to others?: No Assessment of Violence: None Noted Does patient have access to weapons?: No Criminal Charges Pending?: No Does patient have a court date: No Is patient on probation?: No  Psychosis Hallucinations: None noted Delusions: None noted  Mental Status  Report Appearance/Hygiene: Unremarkable Eye Contact: Good Motor Activity: Unremarkable Speech: Unremarkable Level of Consciousness: Alert Mood: Euthymic, Pleasant Affect: Appropriate to circumstance Anxiety Level: Minimal Thought Processes: Coherent Judgement: Partial Orientation: Person, Place Obsessive Compulsive Thoughts/Behaviors: None  Cognitive Functioning Concentration: Normal Memory: Recent Impaired, Remote Impaired IQ: Average Insight: Unable to Assess Impulse Control: Good Appetite: Good Sleep: Unable to Assess Vegetative Symptoms: None  ADLScreening [facility]) Patient's cognitive ability adequate to safely complete daily activities?: Yes Patient able to express need for assistance with ADLs?: Yes ("I may need help with bathing due to my hurt wrist.") Independently performs ADLs?: No  Prior Inpatient Therapy Prior Inpatient Therapy: Yes Prior Therapy Dates: 03/2017 Prior Therapy Facilty/Provider(s): [facility] Reason for Treatment: depression  Prior Outpatient Therapy Prior Outpatient Therapy: No Does patient have an ACCT team?: No Does patient have Intensive In-House Services?  : No Does patient have [facility] services? : No Does patient have P4CC services?: No  ADL Screening (condition at time of admission) Patient's cognitive ability adequate to safely complete daily activities?: Yes Is the patient deaf or have difficulty hearing?: No Does the patient have difficulty seeing, even when wearing glasses/contacts?: Yes (wears glasses) Does the patient have difficulty concentrating, remembering, or making decisions?: Yes Patient able to express need for assistance with ADLs?: Yes ("I may need help with bathing due to my hurt wrist.") Does the patient have difficulty dressing or bathing?: No Independently performs ADLs?: No Communication: Independent Does the patient have difficulty walking or climbing stairs?: No Weakness of Legs: None Weakness of  Arms/Hands: Right  Home Assistive Devices/Equipment Home Assistive Devices/Equipment: None    Abuse/Neglect Assessment (Assessment to be complete while patient is alone) Physical Abuse: Denies Verbal Abuse: Denies Sexual Abuse: Denies Exploitation of patient/patient's resources: Denies Self-Neglect: Denies Values / Beliefs Cultural Requests During Hospitalization: None Spiritual Requests During Hospitalization: None Consults Spiritual Care Consult Needed: No Social Work Consult Needed: Yes (Comment) Merchant navy officer (For Healthcare) Does Patient Have a Medical Advance Directive?: No Would patient like information on creating a medical advance directive?: No - Patient declined Nutrition Screen- [facility] Adult/[facility]/AP Patient's home diet: Regular  Additional Information 1:1 In Past 12 Months?: No CIRT Risk: No Elopement Risk: No Does patient have medical clearance?: Yes     Disposition:  Disposition Initial Assessment Completed for this Encounter: Yes (consulted with Hillery Jacks, FNP) Disposition of Patient: Referred to Other disposition(s): Other (Comment) (Recommendation that pt be referred to SW for situational pla)  Laddie Aquas 03/30/2017 5:55 PM

## 2017-03-30 NOTE — BH Assessment (Signed)
[facility] Assessment Progress Note  Clinician attempted to assess pt at this time as the TTS consult was requested at 1626, but pt is in the hallway and, per [facility] staff, there are no available beds to put him in currently to have a confidential assessment. [facility] staff with call TTS when pt is able to be assessed.   Johny Shock. Ladona Ridgel, MS, NCC, LPCA Counselor

## 2017-03-30 NOTE — ED Triage Notes (Signed)
Pt arrives from parking lot of [employer] via [facility] reporting confusion, pt unable to state where he was or where he was going.  No focal deficit noted at this time, pt denies any recent illness, fever, chills. NAD noted at this time. GCS 14

## 2017-03-30 NOTE — Clinical Social Work Note (Signed)
Clinical Social Work Assessment  Patient Details  Name: Bryan Farrell MRN: [id number] Date of Birth: Mar 31, 1951  Date of referral:  03/30/17               Reason for consult:  Housing Concerns/Homelessness                Permission sought to share information with:  Facility Sport and exercise psychologist, Family Supports Permission granted to share information::  No  Name::        Agency::     Relationship::     Contact Information:     Housing/Transportation Living arrangements for the past 2 months:  Homeless Source of Information:  Patient Patient Interpreter Needed:  None Criminal Activity/Legal Involvement Pertinent to Current Situation/Hospitalization:    Significant Relationships:  Adult Children (Ex-wife) Lives with:  Other (Comment) (Homeless) Do you feel safe going back to the place where you live?  Yes Need for family participation in patient care:  No (Coment)  Care giving concerns:  CSW spoke to the pt's RN who reported that TTS had spoke with the pt and had cleared the pt psychiatrically.    CSW spoke to the pt by phone and stated he had not yet stayed at the [facility] although notes stated he had reported being kicked out.  Several times during the conversation when asked about the previous few day pt had replied, "I don't know", but when confronted with information from the notes regarding pt's stay at [facility] and the fact his ex-wife is offering to help the pt financially, the pt had immediately replied as if he knew this information and stated, he "was not comfortable at this time", with letting his ex-wife help him out financially.    When asked about his Lucianne Lei the pt had immediately replied   Per notes pt has been diagnosed with vascular dementia, as evidenced by a CT scan.  CSW encouraged pt to allow his ex-wife to assist him, but pt again refused and CSW offered pt a shelter list.  Pt accepted the list via phone and faxed said list to pt's RN  to be provided to the pt.  Please reconsult if future social work needs arise.  CSW signing off, as social work intervention is no longer needed.    Social Worker assessment / plan:  CSW met with pt and confirmed pt's plan to be discharged to a shelter to live at discharge.  CSW provided active listening and validated pt's concerns.   Pt refused to let CSW connect him with his ex-wife.  Pt has been living at a motel first then in his [place]i, prior to being admitted to [facility]  Employment status:  Unemployed Insurance information:  Self Pay (Medicaid Pending) PT Recommendations:  Not assessed at this time Information / Referral to community resources:     Patient/Family's Response to care:  Patient alert and oriented.  Patient and agreeable to plan.  Pt's ex-wife supportive and strongly involved in pt.'s care, but pt refuses help. Patient/Family's Understanding of and Emotional Response to Diagnosis, Current Treatment, and Prognosis: Still assessing   Emotional Assessment Appearance:  Other (Comment Required (Unknow pt assessed by phone) Attitude/Demeanor/Rapport:  Unable to Assess Affect (typically observed):  Unable to Assess Orientation:  Oriented to Self, Oriented to Place, Oriented to  Time, Oriented to Situation Alcohol / Substance use:    Psych involvement (Current and /or in the community):     Discharge Needs  Concerns to be addressed:  Denies Needs/Concerns at this time Readmission within the last 30 days:  Yes Current discharge risk:  None Barriers to Discharge:  No Barriers Identified   Claudine Mouton, LCSWA 03/30/2017, 8:45 PM

## 2017-03-31 ENCOUNTER — Emergency Department (HOSPITAL_COMMUNITY)
Admission: EM | Admit: 2017-03-31 | Discharge: 2017-04-01 | Disposition: A | Discharge status: Home / Self Care | Payer: No Typology Code available for payment source | Attending: Emergency Medicine | Admitting: Emergency Medicine

## 2017-03-31 ENCOUNTER — Encounter (HOSPITAL_COMMUNITY): Payer: Self-pay | Admitting: *Deleted

## 2017-03-31 DIAGNOSIS — F32A Depression, unspecified: Secondary | ICD-10-CM

## 2017-03-31 DIAGNOSIS — F329 Major depressive disorder, single episode, unspecified: Secondary | ICD-10-CM | POA: Insufficient documentation

## 2017-03-31 DIAGNOSIS — Z5900 Homelessness unspecified: Secondary | ICD-10-CM

## 2017-03-31 DIAGNOSIS — I10 Essential (primary) hypertension: Secondary | ICD-10-CM | POA: Insufficient documentation

## 2017-03-31 DIAGNOSIS — E119 Type 2 diabetes mellitus without complications: Secondary | ICD-10-CM | POA: Insufficient documentation

## 2017-03-31 HISTORY — DX: Essential (primary) hypertension: I10

## 2017-03-31 LAB — COMPREHENSIVE METABOLIC PANEL
ALBUMIN: 4.2 g/dL (ref 3.5–5.0)
ALT: 17 U/L (ref 17–63)
AST: 25 U/L (ref 15–41)
Alkaline Phosphatase: 56 U/L (ref 38–126)
Anion gap: 10 (ref 5–15)
BILIRUBIN TOTAL: 1.9 mg/dL — AB (ref 0.3–1.2)
BUN: 15 mg/dL (ref 6–20)
CO2: 23 mmol/L (ref 22–32)
CREATININE: 0.95 mg/dL (ref 0.61–1.24)
Calcium: 9.5 mg/dL (ref 8.9–10.3)
Chloride: 102 mmol/L (ref 101–111)
GFR calc Af Amer: >60 mL/min (ref >60)
GLUCOSE: 201 mg/dL — AB (ref 65–99)
POTASSIUM: 4.1 mmol/L (ref 3.5–5.1)
Sodium: 135 mmol/L (ref 135–145)
TOTAL PROTEIN: 7.8 g/dL (ref 6.5–8.1)

## 2017-03-31 LAB — CBC
HEMATOCRIT: 41.8 % (ref 39.0–52.0)
Hemoglobin: 14.3 g/dL (ref 13.0–17.0)
MCH: 29.7 pg (ref 26.0–34.0)
MCHC: 34.2 g/dL (ref 30.0–36.0)
MCV: 86.9 fL (ref 78.0–100.0)
PLATELETS: 438 10*3/uL — AB (ref 150–400)
RBC: 4.81 MIL/uL (ref 4.22–5.81)
RDW: 13.6 % (ref 11.5–15.5)
WBC: 11.6 10*3/uL — AB (ref 4.0–10.5)

## 2017-03-31 LAB — CBG MONITORING, ED
Glucose-Capillary: 182 mg/dL — ABNORMAL HIGH (ref 65–99)
Glucose-Capillary: 182 mg/dL — ABNORMAL HIGH (ref 65–99)

## 2017-03-31 LAB — ETHANOL

## 2017-03-31 LAB — ACETAMINOPHEN LEVEL: Acetaminophen (Tylenol), Serum: <10 ug/mL — ABNORMAL LOW (ref 10–30)

## 2017-03-31 LAB — SALICYLATE LEVEL: Salicylate Lvl: <7 mg/dL (ref 2.8–30.0)

## 2017-03-31 MED ORDER — METFORMIN HCL 500 MG PO TABS
500.0000 mg | ORAL_TABLET | Freq: Two times a day (BID) | ORAL | Status: DC
  Administered 2017-03-31 – 2017-04-01 (×2): 500 mg via ORAL
  Filled 2017-03-31 (×2): qty 1

## 2017-03-31 MED ORDER — FLUOXETINE HCL 20 MG PO CAPS
20.0000 mg | ORAL_CAPSULE | Freq: Every day | ORAL | Status: DC
  Administered 2017-03-31: 20 mg via ORAL
  Filled 2017-03-31: qty 1

## 2017-03-31 MED ORDER — TRAZODONE HCL 100 MG PO TABS
100.0000 mg | ORAL_TABLET | Freq: Every day | ORAL | Status: DC
Start: 2017-03-31 — End: 2017-04-01
  Administered 2017-03-31: 100 mg via ORAL
  Filled 2017-03-31: qty 1

## 2017-03-31 MED ORDER — VITAMIN B-12 1000 MCG PO TABS
1000.0000 ug | ORAL_TABLET | Freq: Every day | ORAL | Status: DC
  Administered 2017-03-31 – 2017-04-01 (×2): 1000 ug via ORAL
  Filled 2017-03-31 (×2): qty 1

## 2017-03-31 NOTE — ED Notes (Addendum)
Pt arrived to Pod F - ambulatory w/Sitter. Belongings in locker - need to be inventoried. Pt alert, oriented, cooperative. Pt has splint to right wrist d/t states injured wrist when he experienced a fall while in shower in ED a couple of weeks ago. Pt aware of need for urine specimen - cup placed at bedside.

## 2017-03-31 NOTE — BH Assessment (Signed)
Tele Assessment Note   Bryan Farrell is a 66 y.o. male who presented to [facility] on a voluntary basis with complaint of "not feeling right."   Pt was released from an inpatient stay at [facility] on 03/29/17 -- he was treated at [facility] due to depressive symptoms and suicidal ideation.  Pt was last assessed by TTS on 03/30/17.  A day after his release from [facility], Pt presented to [facility] with complaint of feeling "depressed and unhappy."  At the conclusion of yesterday's assessment, Pt was psych cleared and referred to the hospital social work for assistance.  Per Pt, he left the hospital and spent the night at the hospital parking deck before returning to the hospital today.  Per history, Pt has a diagnosis of vascular dementia (as evidenced by CAT scan).  Cognitive deficiency was apparent.  Pt reported he decided to return to the hospital today because "I don't feel quite right."  When asked if he was depressed, Pt responded, "I suppose to a certain degree."  When asked if he was suicidal, Pt responded, "Not really.  Maybe just a little bit."  Pt endorsed a past suicide attempt, but he could not recall when it happened.  Pt was a poor historian as he could not provide information on past psychiatric treatment.  Pt stated that he is homeless, and that he has asked his adult children to bring him in but without success.  Pt denied self-injurious behavior, homicidal ideation, and hallucination.  When asked what he wanted to see happen today, he said he was unsure.  During assessment, Pt presented as alert and oriented x4.  He had fair eye contact and was cooperative.  He was dressed in scrubs and appeared disheveled.  Pt's mood was euthymic.  Affect was ambivalent.  Pt could not identify any particular symptom, but within the last week, he endorsed despondency.  Pt's speech was normal in rate, rhythm, and volume.  Pt's thought processes were within normal range.  Pt's thought content indicated memory difficulties.  Memory  and concentration were poor.  Impulse control, judgment, and insight were fair to poor.  Consulted with Chilton Greathouse, NP, who re-affirmed her determination of 03/30/17 -- that Pt is psych cleared and will benefit from a social work consult to help Pt find appropriate shelter and treatment for dementia.  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 Diabetes mellitus without complication (HCC)   . Hypertension     Past Surgical History:  Procedure Laterality Date  . TONSILLECTOMY      Family History: No family history on file.  Social History:  reports that he has never smoked. He has never used smokeless tobacco. He reports that he does not drink alcohol or use drugs.  Additional Social History:  Alcohol / Drug Use Pain Medications: See MAR Prescriptions: See MAR Over the Counter: See MAR History of alcohol / drug use?: No history of alcohol / drug abuse  CIWA: CIWA-Ar BP: 133/83 Pulse Rate: (!) 101 COWS:    PATIENT STRENGTHS: (choose at least two) Average or above average intelligence Communication skills  Allergies: No Known Allergies  Home Medications:  (Not in a hospital admission)  OB/GYN Status:  No LMP for male patient.  General Assessment Data Location of Assessment: [facility] ED TTS Assessment: In system Is this a Tele or Face-to-Face Assessment?: Tele Assessment Is this an Initial Assessment or a Re-assessment for  this encounter?: Initial Assessment Marital status: Divorced Living Arrangements: Other (Comment) (Reported that he is homeless) Can pt return to current living arrangement?: Yes Admission Status: Voluntary Is patient capable of signing voluntary admission?: Yes Referral Source: Self/Family/Friend Insurance type: Self pay     Crisis Care Plan Living Arrangements: Other (Comment) (Reported that he is homeless) Name of Psychiatrist:  none Name of Therapist: none  Education Status Is patient currently in school?: No  Risk to self with the past 6 months Suicidal Ideation: No-Not Currently/Within Last 6 Months ("Not really, maybe just a little bit.") Has patient been a risk to self within the past 6 months prior to admission? : No Suicidal Intent: No Has patient had any suicidal intent within the past 6 months prior to admission? : No Is patient at risk for suicide?: No Suicidal Plan?: No Has patient had any suicidal plan within the past 6 months prior to admission? : No Access to Means: No What has been your use of drugs/alcohol within the last 12 months?: None Previous Attempts/Gestures: Yes How many times?: 1 Triggers for Past Attempts: Unknown Intentional Self Injurious Behavior: None Family Suicide History: Unknown Recent stressful life event(s): Other (Comment) (Homeless; vascular dementia) Persecutory voices/beliefs?: No Depression: Yes Depression Symptoms: Despondent Substance abuse history and/or treatment for substance abuse?: No Suicide prevention information given to non-admitted patients: Not applicable  Risk to Others within the past 6 months Homicidal Ideation: No Does patient have any lifetime risk of violence toward others beyond the six months prior to admission? : No Thoughts of Harm to Others: No Current Homicidal Intent: No Current Homicidal Plan: No Access to Homicidal Means: No History of harm to others?: No Assessment of Violence: None Noted Does patient have access to weapons?: No Criminal Charges Pending?: No Does patient have a court date: No Is patient on probation?: No  Psychosis Hallucinations: None noted Delusions: None noted  Mental Status Report Appearance/Hygiene: Unremarkable Eye Contact: Fair Motor Activity: Freedom of movement, Unremarkable Speech: Unremarkable, Logical/coherent Level of Consciousness: Alert Mood: Euthymic Affect: Apathetic  (Ambivalent) Anxiety Level: None Thought Processes: Coherent, Relevant Judgement: Partial Orientation: Person, Place, Situation, Time Obsessive Compulsive Thoughts/Behaviors: None  Cognitive Functioning Concentration: Fair Memory: Recent Impaired, Remote Impaired IQ: Average Insight: Fair Impulse Control: Fair Appetite: Good Sleep: No Change Vegetative Symptoms: None  ADLScreening [facility] Assessment Services) Patient's cognitive ability adequate to safely complete daily activities?: Yes Patient able to express need for assistance with ADLs?: No Independently performs ADLs?: Yes (appropriate for developmental age)  Prior Inpatient Therapy Prior Inpatient Therapy: Yes Prior Therapy Dates: 03/2017 Prior Therapy Facilty/Provider(s): [facility] Reason for Treatment: depression  Prior Outpatient Therapy Prior Outpatient Therapy: No Does patient have an ACCT team?: No Does patient have Intensive In-House Services?  : No Does patient have [facility] services? : No Does patient have P4CC services?: No  ADL Screening (condition at time of admission) Patient's cognitive ability adequate to safely complete daily activities?: Yes Is the patient deaf or have difficulty hearing?: No Does the patient have difficulty seeing, even when wearing glasses/contacts?: No Does the patient have difficulty concentrating, remembering, or making decisions?: Yes Patient able to express need for assistance with ADLs?: No Does the patient have difficulty dressing or bathing?: Yes Independently performs ADLs?: Yes (appropriate for developmental age) Communication: Independent Does the patient have difficulty walking or climbing stairs?: No Weakness of Legs: None Weakness of Arms/Hands: None  Home Assistive Devices/Equipment Home Assistive Devices/Equipment: None  Therapy Consults (therapy consults require a physician order)  PT Evaluation Needed: No OT Evalulation Needed: No SLP Evaluation Needed:  No Abuse/Neglect Assessment (Assessment to be complete while patient is alone) Physical Abuse: Denies Verbal Abuse: Denies Sexual Abuse: Denies Exploitation of patient/patient's resources: Denies Self-Neglect: Denies Values / Beliefs Cultural Requests During Hospitalization: None Spiritual Requests During Hospitalization: None Consults Spiritual Care Consult Needed: No Social Work Consult Needed: No Merchant navy officer (For Healthcare) Does Patient Have a Medical Advance Directive?: No    Additional Information 1:1 In Past 12 Months?: No CIRT Risk: No Elopement Risk: No Does patient have medical clearance?: Yes     Disposition:  Disposition Initial Assessment Completed for this Encounter: Yes Disposition of Patient: Referred to Other disposition(s): Other (Comment) (Recommend social work placement)  Dennard Nip T Ac Colan 03/31/2017 6:44 PM

## 2017-03-31 NOTE — ED Triage Notes (Signed)
Pt is here with dizziness and suicidal thoughts.  Pt is alert and oriented.  Sitter being sent from staffing.  CBG 182.

## 2017-03-31 NOTE — ED Provider Notes (Signed)
[facility]-EMERGENCY DEPT Provider Note   CSN: [id number] Arrival date & time: 03/31/17  1419     History   Chief Complaint Chief Complaint  Patient presents with  . Dizziness  . Suicidal    HPI Bryan Farrell is a 66 y.o. male.  HPI 66 year old man history of diabetes, hypertension, depression, cognitive impairment presents today stating that he is depressed and got lightheaded. He tells me that since he was last discharged from [facility] he has been living in a building her here. He states it was 1 card to parked in. He states today he was unable to find his walking since he does not have any belongings now. He states he has been depressed and suicidal in the past but does not feel that he is suicidal at this moment. He states he feels like he has some memory loss but does not think it is severe. He is unable tell me any more about feeling lightheaded earlier today. He states he has not been able to eat today. He has not been taking any of his medications. Past Medical History:  Diagnosis Date  . Diabetes mellitus without complication (HCC)   . Hypertension     Patient Active Problem List   Diagnosis Date Noted  . Vitamin B12 deficiency 03/25/2017  . Vascular dementia 03/23/2017  . Suicidal ideation 03/23/2017  . Homelessness 03/23/2017  . Diabetes (HCC) 03/23/2017  . 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 TONSILLECTOMY         Home Medications    Prior to Admission medications   Medication Sig Start Date End Date Taking? Authorizing Provider  cyanocobalamin 1000 MCG tablet Take 1 tablet (1,000 mcg total) by mouth daily. 03/29/17   Pucilowska, Braulio Conte B, MD  FLUoxetine (PROZAC) 20 MG capsule Take 1 capsule (20 mg total) by mouth at bedtime. 03/28/17   Pucilowska, Braulio Conte B, MD  metFORMIN (GLUCOPHAGE) 500 MG tablet Take 1 tablet (500 mg total) by mouth 2  (two) times daily with a meal. 03/28/17   Pucilowska, Jolanta B, MD  traZODone (DESYREL) 100 MG tablet Take 1 tablet (100 mg total) by mouth at bedtime. 03/28/17   Shari Prows, MD    Family History No family history on file.  Social History Social History  Substance Use Topics  . Smoking status: Never Smoker  . Smokeless tobacco: Never Used  . Alcohol use No     Allergies   Patient has no known allergies.   Review of Systems Review of Systems  All other systems reviewed and are negative.    Physical Exam Updated Vital Signs BP 133/83 (BP Location: Right Arm)   Pulse (!) 101   Temp 98.4 F (36.9 C) (Oral)   Resp 18   Ht 1.829 m (6')   Wt 113.4 kg (250 lb)   SpO2 99%   BMI 33.91 kg/m   Physical Exam  Constitutional: He is oriented to person, place, and time. He appears well-developed.  HENT:  Head: Normocephalic and atraumatic.  Right Ear: External ear normal.  Left Ear: External ear normal.  Nose: Nose normal.  Eyes: EOM are normal.  Neck: No tracheal deviation present.  Pulmonary/Chest: Effort normal.  Musculoskeletal: Normal range of motion.  Neurological: He is alert and oriented to person, place, and time.  Skin: Skin is warm and dry.  Psychiatric: His behavior is normal. Thought content normal. Cognition and memory  are impaired. He exhibits a depressed mood.  Nursing note and vitals reviewed.    ED Treatments / Results  Labs (all labs ordered are listed, but only abnormal results are displayed) Labs Reviewed  COMPREHENSIVE METABOLIC PANEL - Abnormal; Notable for the following:       Result Value   Glucose, Bld 201 (*)    Total Bilirubin 1.9 (*)    All other components within normal limits  ACETAMINOPHEN LEVEL - Abnormal; Notable for the following:    Acetaminophen (Tylenol), Serum <10 (*)    All other components within normal limits  CBC - Abnormal; Notable for the following:    WBC 11.6 (*)    Platelets 438 (*)    All other  components within normal limits  CBG MONITORING, ED - Abnormal; Notable for the following:    Glucose-Capillary 182 (*)    All other components within normal limits  ETHANOL  SALICYLATE LEVEL  RAPID URINE DRUG SCREEN, HOSP PERFORMED    EKG  EKG Interpretation  Date/Time:  Saturday March 31 2017 14:52:38 EDT Ventricular Rate:  99 PR Interval:  154 QRS Duration: 82 QT Interval:  368 QTC Calculation: 472 R Axis:   3 Text Interpretation:  Normal sinus rhythm Nonspecific ST abnormality Abnormal ECG Confirmed by Margarita Grizzle [id number]) on 03/31/2017 5:47:15 PM       Radiology Dg Chest 2 View  Result Date: 03/30/2017 CLINICAL DATA:  Altered mental status. EXAM: CHEST  2 VIEW COMPARISON:  02/03/2010 FINDINGS: The heart size and mediastinal contours are within normal limits. Both lungs are clear. The visualized skeletal structures are unremarkable. IMPRESSION: No active cardiopulmonary disease. Electronically Signed   By: Signa Kell M.D.   On: 03/30/2017 11:18    Procedures Procedures (including critical care time)  Medications Ordered in ED Medications - No data to display   Initial Impression / Assessment and Plan / ED Course  I have reviewed the triage vital signs and the nursing notes.  Pertinent labs & imaging results that were available during my care of the patient were reviewed by me and considered in my medical decision making (see chart for details).    Patient appears medically stable.  Pending bh consult  Final Clinical Impressions(s) / ED Diagnoses   Final diagnoses:  Depression, unspecified depression type  Homeless    New Prescriptions New Prescriptions   No medications on file     Margarita Grizzle, MD 03/31/17 1956

## 2017-03-31 NOTE — ED Notes (Signed)
SW, [facility], called and advised pt is clear from psych - recommends SW consult.

## 2017-03-31 NOTE — ED Notes (Signed)
Dr Rosalia Hammers in w/pt. Fall Risk sign placed on door.

## 2017-04-01 NOTE — ED Notes (Signed)
Left message for SW

## 2017-04-01 NOTE — ED Notes (Addendum)
Christiane Ha, SW, advised he spoke w/pt on 03/30/17 and will give pt resources again today. Also states he will discuss w/Dr Donnald Garre.

## 2017-04-01 NOTE — ED Notes (Signed)
PT REFUSING D/C - SECURITY AND OFF DUTY [employer] OFFICER ASSISTING. PT HAS RESOURCES GIVEN BY SW AND BUS PASS. PT REFUSING TO SIGN FOR D/C PAPERS OR BELONGINGS.

## 2017-04-01 NOTE — ED Notes (Signed)
Pt declined snack offered x 2.

## 2017-04-01 NOTE — ED Notes (Signed)
Christiane Ha, SW, in w/pt.

## 2017-04-01 NOTE — Progress Notes (Addendum)
Consult request has been received. CSW attempting to follow up at present time.  This pt is known to this CSW due to his recent consult on the pt from the pt's last visit to the [facility] ED on 8/17.    Pt per notes, pt left the ED and was placed in the waiting room to leave, then apparently either left on 8/17 at 7:59 pm and returned following day (8/18) at 2:56pm c/o dizziness.  CSW's note from 8/17 at 7:59pm (last ED visit):   CSW spoke to the pt's RN who reported that TTS had spoke with the pt and had cleared the pt psychiatrically.   CSW spoke to the pt by phone and stated he had not yet stayed at the [facility] although notes stated he had reported being kicked out.  Several times during the conversation when asked about the previous few day pt had replied, "I don't know", but when confronted with information from the notes regarding pt's stay at [facility] and the fact his ex-wife is offering to help the pt financially, the pt had immediately replied as if he knew this information and stated, he "was not comfortable at this time", with letting his ex-wife help him out financially.    When asked about his Lucianne Lei the pt had immediately replied "I don't know if it is still there, or not"  Per notes pt has been diagnosed with vascular dementia, as evidenced by a CT scan.  CSW encouraged pt to allow his ex-wife to assist him, but pt again refused and CSW offered pt a shelter list.  Pt accepted the list via phone and faxed said list to pt's RN to be provided to the pt.  10:00 AM   CSW updated EDP, CSW will speak to pt and offer shelter list.  10:52 AM  CSW met with pt and repeated CSW's earlier conversation with the pt on 8/17 and reiterated that the CSW was willing to assist the pt in contacting the pt's ex-wife who is willing to assist the pt financially, per the notes.  CSW informed pt pt's only alternative at this time was to be provided with a shelter list.  Pt presented  as if he were attempting to make up his mind to let the CSW assist in calling his ex-wife and after several minutes the CSW counseld the pt in the efficacy of accepting help, affirmed the pt had the right to self-determination, and then encourafed the pt again to allow the CSW to assist the pt.  Pt refused to allow the CSW to contact his ex-wife.    CSW then provided pt with a shelter list with directions on how to request help from the bus driver on reaching the shelter of his choice.   CSW again offered to reach out to the pt's ex-wife with the pt's permission and the pt refused.  CSW counseled pt that if a care plan was put into place at [facility] he would be well-known to [place] as arriving to the E.D. In order to be housed, unless there was a treatable medical condition and for the pt to reasonably expect to be "lodged" at the hospital for a lack of a "place to go".  Pt indicated he understood and CSW again offered to assist the pt and the pt did not give verbal permission.  CSW updated the RN.  Please reconsult if future social work needs arise.  CSW signing off, as social work intervention is no  longer needed.   Bryan Farrell, LCSWA, Deon Pilling, CSI Clinical Social Worker Ph: [phone]

## 2017-04-01 NOTE — ED Notes (Addendum)
Pt states did not want to eat lunch - meal placed in bag for pt and given to him to take. Security and Off-Duty [employer] Officer escorted pt out of ED d/t pt continued to refuse to leave even after much discussion. Homeless shelter resources given w/bus pass. Offered for pt to call ex-spouse - declined.

## 2017-04-01 NOTE — ED Notes (Signed)
Pt noted w/splint to right wrist - pt able to move fingers w/o difficulty. Cap refill less than 3 seconds.

## 2017-04-02 ENCOUNTER — Emergency Department (HOSPITAL_COMMUNITY)
Admission: EM | Admit: 2017-04-02 | Discharge: 2017-04-02 | Disposition: A | Discharge status: Home / Self Care | Payer: No Typology Code available for payment source | Attending: Emergency Medicine | Admitting: Emergency Medicine

## 2017-04-02 DIAGNOSIS — R45851 Suicidal ideations: Secondary | ICD-10-CM | POA: Insufficient documentation

## 2017-04-02 DIAGNOSIS — E119 Type 2 diabetes mellitus without complications: Secondary | ICD-10-CM | POA: Insufficient documentation

## 2017-04-02 DIAGNOSIS — I10 Essential (primary) hypertension: Secondary | ICD-10-CM | POA: Insufficient documentation

## 2017-04-02 DIAGNOSIS — F32A Depression, unspecified: Secondary | ICD-10-CM

## 2017-04-02 DIAGNOSIS — F329 Major depressive disorder, single episode, unspecified: Secondary | ICD-10-CM | POA: Insufficient documentation

## 2017-04-02 LAB — SALICYLATE LEVEL: Salicylate Lvl: <7 mg/dL (ref 2.8–30.0)

## 2017-04-02 LAB — I-STAT CHEM 8, ED
BUN: 20 mg/dL (ref 6–20)
CALCIUM ION: 1.15 mmol/L (ref 1.15–1.40)
CHLORIDE: 101 mmol/L (ref 101–111)
CREATININE: 0.8 mg/dL (ref 0.61–1.24)
Glucose, Bld: 187 mg/dL — ABNORMAL HIGH (ref 65–99)
HCT: 47 % (ref 39.0–52.0)
Hemoglobin: 16 g/dL (ref 13.0–17.0)
Potassium: 4.1 mmol/L (ref 3.5–5.1)
Sodium: 137 mmol/L (ref 135–145)
TCO2: 24 mmol/L (ref 0–100)

## 2017-04-02 LAB — ETHANOL

## 2017-04-02 LAB — ACETAMINOPHEN LEVEL: Acetaminophen (Tylenol), Serum: <10 ug/mL — ABNORMAL LOW (ref 10–30)

## 2017-04-02 NOTE — ED Notes (Signed)
ED Provider at bedside. 

## 2017-04-02 NOTE — Discharge Instructions (Signed)
Please make sure you follow up with [facility].

## 2017-04-02 NOTE — ED Triage Notes (Signed)
Pt presents voluntary via EMS after SI. States he would hang himself if he had the means. States that he had a knife and a gun with him. Security searching pt. Alert and oriented. Possibly responding to internal stimuli.

## 2017-04-02 NOTE — BH Assessment (Signed)
[facility] Assessment Progress Note  Consulted with [facility] NP who states patient does not meet impatent criteria and recommended patient be discharged with resources.

## 2017-04-02 NOTE — BH Assessment (Addendum)
Assessment Note  Bryan Farrell is an 66 y.o. male that presents this date homeless stating he "had no where else to go." This Clinical research associate inquired how [facility] could assist him with patient stating "you can make my children give me a place to live." Patient denies any S/I, H/I or AVH. Patient stated on admission he would hang himself if he "had the means". Patient denies having any access to means and stated "he is desperate for a place to stay because I am old." Patient on admission stated he had a knife and a gun but denies during assessment. This Clinical research associate was assisted by [employer] and [employer] verified that patient was not in possession of a firearm or knife. Patient stated "I will say anything if you give me a place to stay." Patient was discharged from [facility] yesterday per that note Bryan Orion RN wrote, "Patient states did not want to eat lunch, patient's meal placed in bag for patient and given to him to take. Security and [employer] officer escorted patient out of ED although patient continued to refuse to leave even after much discussion. Homeless shelter resources given with bus pass". Patient reports to this writer after being discharged yesterday he "stayed under a bridge" and then presented to [facility] to ask for assistance to locate housing. This Clinical research associate explained to patient that [facility] could provide resources for area shelters and information/location of [employer] in [place], [place]. Patient is oriented to time/place and denies any SA use. Per note review, patient presented to [facility] (03/31/17) on a voluntary basis with complaint of "not feeling right." Patient was released from an inpatient stay at [facility] on 03/29/17 treated at [facility] due to depressive symptoms and suicidal ideation.  Patient was last assessed by TTS on 03/30/17.  A day after his release from [facility], patient presented to [facility] with complaint of feeling "depressed and unhappy." At the conclusion of yesterday's assessment, patient was psych cleared and referred to the  hospital social work for assistance. Per patient, he left the hospital and spent the night at the hospital parking deck before returning to the hospital today. Per history, when asked if he was suicidal, patient responded, "Not really.  Maybe just a little bit."  Patient endorsed a past suicide attempt, but he could not recall when it happened. Patient was a poor historian as he could not provide information on past psychiatric treatment. Patient denied self-injurious behavior, homicidal ideation, and hallucination. Consulted with Bryan Care NP who states patient does not meet inpatent criteria and recommended patient be discharged with resources.     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   Diagnosis: MDD  Past Medical History:  Past Medical History:  Diagnosis Date  . Diabetes mellitus without complication (HCC)   . Hypertension     Past Surgical History:  Procedure Laterality Date  . TONSILLECTOMY      Family History: No family history on file.  Social History:  reports that he has never smoked. He has never used smokeless tobacco. He reports that he does not drink alcohol or use drugs.  Additional Social History:  Alcohol / Drug Use Pain Medications: See MAR Prescriptions: See MAR Over the Counter: See MAR History of alcohol / drug use?: No history of alcohol / drug abuse  CIWA:   COWS:    Allergies: No Known Allergies  Home Medications:  (Not in a hospital admission)  OB/GYN Status:  No LMP for male patient.  General Assessment Data  [facility]: [facility] TTS Assessment: In system Is this a Tele or Face-to-Face Assessment?: Face-to-Face Is this an Initial Assessment or a Re-assessment for this encounter?: Initial Assessment Marital status: Divorced [place] name:  (NA) Is patient pregnant?: No Pregnancy Status: No Living Arrangements: Other (Comment) (Homeless) Can pt return to current living  arrangement?: Yes Admission Status: Voluntary Is patient capable of signing voluntary admission?: Yes Referral Source: Self/Family/Friend Insurance type: Self Pay  Medical Screening Exam [facility] Walk-in ONLY) Medical Exam completed: Yes  Crisis Farrell Plan Living Arrangements: Other (Comment) (Homeless) Legal Guardian:  (NA) Name of Psychiatrist: None Name of Therapist: None  Education Status Is patient currently in school?: No Current Grade: NA Highest grade of school patient has completed:  (12th) Name of school: NA Contact person: NA  Risk to self with the past 6 months Suicidal Ideation: No Has patient been a risk to self within the past 6 months prior to admission? : No Suicidal Intent: No Has patient had any suicidal intent within the past 6 months prior to admission? : No Is patient at risk for suicide?: No Suicidal Plan?: No Has patient had any suicidal plan within the past 6 months prior to admission? : No Access to Means: No What has been your use of drugs/alcohol within the last 12 months?: None Previous Attempts/Gestures: Yes How many times?: 1 Other Self Harm Risks: NA Triggers for Past Attempts: Unknown Intentional Self Injurious Behavior: None Family Suicide History: No Recent stressful life event(s): Other (Comment) (Homeless) Persecutory voices/beliefs?: No Depression: Yes Depression Symptoms: Feeling worthless/self pity Substance abuse history and/or treatment for substance abuse?: No Suicide prevention information given to non-admitted patients: Not applicable  Risk to Others within the past 6 months Homicidal Ideation: No Does patient have any lifetime risk of violence toward others beyond the six months prior to admission? : No Thoughts of Harm to Others: No Current Homicidal Intent: No Current Homicidal Plan: No Access to Homicidal Means: No Identified Victim: NA History of harm to others?: No Assessment of Violence: None Noted Violent Behavior  Description: NA Does patient have access to weapons?: No Criminal Charges Pending?: No Does patient have a court date: No Is patient on probation?: No  Psychosis Hallucinations: None noted Delusions: None noted  Mental Status Report Appearance/Hygiene: Unremarkable Eye Contact: Fair Motor Activity: Freedom of movement Speech: Logical/coherent Level of Consciousness: Alert Mood: Irritable Affect: Appropriate to circumstance Anxiety Level: Minimal Thought Processes: Coherent, Relevant Judgement: Unimpaired Orientation: Person, Place, Time Obsessive Compulsive Thoughts/Behaviors: None  Cognitive Functioning Concentration: Good Memory: Recent Intact, Remote Intact IQ: Average Insight: Fair Impulse Control: Fair Appetite: Good Weight Loss: 0 Weight Gain: 0 Sleep: No Change Total Hours of Sleep: 7 Vegetative Symptoms: None  ADLScreening [facility] Assessment Services) Patient's cognitive ability adequate to safely complete daily activities?: Yes Patient able to express need for assistance with ADLs?: Yes Independently performs ADLs?: Yes (appropriate for developmental age)  Prior Inpatient Therapy Prior Inpatient Therapy: Yes Prior Therapy Dates: 2018 Prior Therapy Facilty/Provider(s): [facility], [facility] Reason for Treatment: MH issues  Prior Outpatient Therapy Prior Outpatient Therapy: No Prior Therapy Dates: NA Prior Therapy Facilty/Provider(s): NA Reason for Treatment: NA Does patient have an ACCT team?: No Does patient have Intensive In-House Services?  : No Does patient have [facility] services? : No Does patient have P4CC services?: No  ADL Screening (condition at time of admission) Patient's cognitive ability adequate to safely complete daily activities?: Yes Is the patient deaf or have difficulty hearing?: No Does the  patient have difficulty seeing, even when wearing glasses/contacts?: No Does the patient have difficulty concentrating, remembering, or making decisions?:  No Patient able to express need for assistance with ADLs?: Yes Does the patient have difficulty dressing or bathing?: No Independently performs ADLs?: Yes (appropriate for developmental age) Does the patient have difficulty walking or climbing stairs?: No Weakness of Legs: None Weakness of Arms/Hands: None  Home Assistive Devices/Equipment Home Assistive Devices/Equipment: None  Therapy Consults (therapy consults require a physician order) PT Evaluation Needed: No OT Evalulation Needed: No SLP Evaluation Needed: No Abuse/Neglect Assessment (Assessment to be complete while patient is alone) Physical Abuse: Denies Verbal Abuse: Denies Sexual Abuse: Denies Exploitation of patient/patient's resources: Denies Self-Neglect: Denies Values / Beliefs Cultural Requests During Hospitalization: None Spiritual Requests During Hospitalization: None Consults Spiritual Farrell Consult Needed: No Social Work Consult Needed: No Merchant navy officer (For Healthcare) Does Patient Have a Medical Advance Directive?: No Would patient like information on creating a medical advance directive?: No - Patient declined    Additional Information 1:1 In Past 12 Months?: No CIRT Risk: No Elopement Risk: No Does patient have medical clearance?: Yes     Disposition: Consulted with Bryan Care NP who states patient does not meet impatent criteria and recommended patient be discharged with resources.     Disposition Initial Assessment Completed for this Encounter: Yes Disposition of Patient: Other dispositions Other disposition(s): Other (Comment) (Pt to be discharged this date with OP resources)  On Site Evaluation by:   Reviewed with Physician:    Alfredia Ferguson 04/02/2017 11:47 AM

## 2017-04-02 NOTE — ED Provider Notes (Signed)
[facility]-EMERGENCY DEPT Provider Note   CSN: [id number] Arrival date & time: 04/02/17  0904     History   Chief Complaint Chief Complaint  Patient presents with  . Suicidal    HPI Bryan Farrell is a 66 y.o. male.  HPI 66 year old Caucasian male past medical history significant for diabetes, hypertension, depression, homelessness that presents to the emergency Department today with complaints of suicidal ideations.patient has been seen several times in the past week for same. Has had extensive workup in TTS consult and does not meet inpatient criteria. Patient returns todayby EMS. Patient states he wants to hang himself. He also states he has a knife and gun with him. Patient states he has suicidal ideations. Has a history of trying to hang himself in the past. Patient states he thinks about hanging himself or walking on her front of a car to kill himself however he wants to make sure that it is not put him in the hospital. The patient denies taking any medications. He denies any medical complaints at this time. States that he is homeless and lives under some bridge. The patient states he just "has no other reason to live". No suicidal attempt at this time or in the past week.  Patient denies alcohol use, drug use, tobacco use. Denies any homicidal ideations. Denies any auditory or visual hallucinations.  Pt denies any fever, chill, ha, vision changes, lightheadedness, dizziness, congestion, neck pain, cp, sob, cough, abd pain, n/v/d, urinary symptoms, change in bowel habits, melena, hematochezia, lower extremity paresthesias.  Past Medical History:  Diagnosis Date  . Diabetes mellitus without complication (HCC)   . Hypertension     Patient Active Problem List   Diagnosis Date Noted  . Vitamin B12 deficiency 03/25/2017  . Vascular dementia 03/23/2017  . Suicidal ideation 03/23/2017  . Homelessness 03/23/2017  . Diabetes (HCC) 03/23/2017  . Major depressive disorder, recurrent  severe without psychotic features (HCC) 03/22/2017    Past Surgical History:  Procedure Laterality Date  . TONSILLECTOMY         Home Medications    Prior to Admission medications   Medication Sig Start Date End Date Taking? Authorizing Provider  cyanocobalamin 1000 MCG tablet Take 1 tablet (1,000 mcg total) by mouth daily. Patient not taking: Reported on 03/31/2017 03/29/17   Pucilowska, Braulio Conte B, MD  FLUoxetine (PROZAC) 20 MG capsule Take 1 capsule (20 mg total) by mouth at bedtime. Patient not taking: Reported on 03/31/2017 03/28/17   Pucilowska, Braulio Conte B, MD  metFORMIN (GLUCOPHAGE) 500 MG tablet Take 1 tablet (500 mg total) by mouth 2 (two) times daily with a meal. Patient not taking: Reported on 03/31/2017 03/28/17   Pucilowska, Ellin Goodie, MD  traZODone (DESYREL) 100 MG tablet Take 1 tablet (100 mg total) by mouth at bedtime. Patient not taking: Reported on 03/31/2017 03/28/17   Shari Prows, MD    Family History No family history on file.  Social History Social History  Substance Use Topics  . Smoking status: Never Smoker  . Smokeless tobacco: Never Used  . Alcohol use No     Comment: BAC clear     Allergies   Patient has no known allergies.   Review of Systems Review of Systems  Constitutional: Negative for chills and fever.  HENT: Negative for congestion and sore throat.   Eyes: Negative for visual disturbance.  Respiratory: Negative for cough and shortness of breath.   Cardiovascular: Negative for chest pain.  Gastrointestinal: Negative for abdominal pain, diarrhea,  nausea and vomiting.  Genitourinary: Negative for dysuria, flank pain, frequency, hematuria, scrotal swelling, testicular pain and urgency.  Musculoskeletal: Negative for arthralgias and myalgias.  Skin: Negative for rash.  Neurological: Negative for dizziness, syncope, weakness, light-headedness, numbness and headaches.  Psychiatric/Behavioral: Positive for behavioral problems and  suicidal ideas. Negative for self-injury and sleep disturbance. The patient is not nervous/anxious.      Physical Exam Updated Vital Signs There were no vitals taken for this visit.  Physical Exam  Constitutional: He is oriented to person, place, and time. He appears well-developed and well-nourished.  Non-toxic appearance. No distress.  HENT:  Head: Normocephalic and atraumatic.  Mouth/Throat: Oropharynx is clear and moist.  Eyes: Pupils are equal, round, and reactive to light. Conjunctivae are normal. Right eye exhibits no discharge. Left eye exhibits no discharge.  Neck: Normal range of motion. Neck supple.  Cardiovascular: Normal rate, regular rhythm, normal heart sounds and intact distal pulses.  Exam reveals no gallop and no friction rub.   No murmur heard. Pulmonary/Chest: Effort normal and breath sounds normal. No respiratory distress. He has no wheezes. He has no rales. He exhibits no tenderness.  Abdominal: Soft. Bowel sounds are normal. He exhibits no distension. There is no tenderness. There is no rebound and no guarding.  Musculoskeletal: Normal range of motion. He exhibits no tenderness.  Lymphadenopathy:    He has no cervical adenopathy.  Neurological: He is alert and oriented to person, place, and time.  Skin: Skin is warm and dry. Capillary refill takes less than 2 seconds. No rash noted.  Psychiatric: Judgment normal. His speech is delayed. He is withdrawn. He is not actively hallucinating. Thought content is not delusional. Cognition and memory are normal. He exhibits a depressed mood. He expresses suicidal ideation. He expresses no homicidal ideation. He expresses suicidal plans. He expresses no homicidal plans.  Nursing note and vitals reviewed.    ED Treatments / Results  Labs (all labs ordered are listed, but only abnormal results are displayed) Labs Reviewed  ACETAMINOPHEN LEVEL - Abnormal; Notable for the following:       Result Value   Acetaminophen  (Tylenol), Serum <10 (*)    All other components within normal limits  I-STAT CHEM 8, ED - Abnormal; Notable for the following:    Glucose, Bld 187 (*)    All other components within normal limits  ETHANOL  SALICYLATE LEVEL  RAPID URINE DRUG SCREEN, HOSP PERFORMED    EKG  EKG Interpretation None       Radiology No results found.  Procedures Procedures (including critical care time)  Medications Ordered in ED Medications - No data to display   Initial Impression / Assessment and Plan / ED Course  I have reviewed the triage vital signs and the nursing notes.  Pertinent labs & imaging results that were available during my care of the patient were reviewed by me and considered in my medical decision making (see chart for details).     Patient presents to the ED with complaints of suicidal ideations. Patient has been seen several times in the past week for same. Was discharged on 8/16 and 8/17 for same. The patient continues that he is suicidal and depressed. Has no medical complaints at this time. Medical screening labs are unremarkable.TTS was counseled to. From medical standpoint with normal vital signs, reassuring lab work patient can be medically cleared.  Pt reports suicidal thoughts however he told TTS that ""I will say anything if you give me a place  to stay." the patient states that he is homeless and "has nowhere else to go". Initially patient said that he had a gun or knife on him. Patient was strip searched with [employer] and not positional firearms at night. Patient states that he may consult.  TTS states the patient does not meet inpatient criteria and they recommend disposition with outpatient follow-up. Social worker was contacted and gave resources to patient. The patient provided a bus pass   Final Clinical Impressions(s) / ED Diagnoses   Final diagnoses:  Suicidal ideation  Depression, unspecified depression type    New Prescriptions New Prescriptions   No  medications on file     Wallace Keller 04/02/17 1358    Lorre Nick, MD 04/03/17 [phone]

## 2017-04-02 NOTE — ED Notes (Signed)
Bed: WTR5 Expected date:  Expected time:  Means of arrival:  Comments: 

## 2017-04-02 NOTE — ED Notes (Signed)
Pt d/c with bus pass given to him by TTS with instructions to go to the [facility]. Pt was looking for his red duffle bag but was informed that he did not come in with one, only a plastic belongings bag.

## 2017-04-02 NOTE — ED Notes (Signed)
Patient was alert, oriented and stable upon discharge. RN went over AVS and patient had no further questions.  

## 2017-04-03 ENCOUNTER — Emergency Department (HOSPITAL_COMMUNITY)
Admission: EM | Admit: 2017-04-03 | Discharge: 2017-04-03 | Disposition: A | Discharge status: Home / Self Care | Payer: No Typology Code available for payment source | Attending: Emergency Medicine | Admitting: Emergency Medicine

## 2017-04-03 ENCOUNTER — Encounter (HOSPITAL_COMMUNITY): Payer: Self-pay | Admitting: Emergency Medicine

## 2017-04-03 DIAGNOSIS — I10 Essential (primary) hypertension: Secondary | ICD-10-CM | POA: Insufficient documentation

## 2017-04-03 DIAGNOSIS — E119 Type 2 diabetes mellitus without complications: Secondary | ICD-10-CM | POA: Insufficient documentation

## 2017-04-03 DIAGNOSIS — Z5900 Homelessness unspecified: Secondary | ICD-10-CM

## 2017-04-03 DIAGNOSIS — Z7984 Long term (current) use of oral hypoglycemic drugs: Secondary | ICD-10-CM | POA: Insufficient documentation

## 2017-04-03 LAB — CBC WITH DIFFERENTIAL/PLATELET
BASOS ABS: 0 10*3/uL (ref 0.0–0.1)
BASOS PCT: 0 %
EOS ABS: 0.1 10*3/uL (ref 0.0–0.7)
Eosinophils Relative: 1 %
HEMATOCRIT: 40.4 % (ref 39.0–52.0)
HEMOGLOBIN: 13.8 g/dL (ref 13.0–17.0)
Lymphocytes Relative: 19 %
Lymphs Abs: 1.7 10*3/uL (ref 0.7–4.0)
MCH: 29.5 pg (ref 26.0–34.0)
MCHC: 34.2 g/dL (ref 30.0–36.0)
MCV: 86.3 fL (ref 78.0–100.0)
Monocytes Absolute: 0.5 10*3/uL (ref 0.1–1.0)
Monocytes Relative: 6 %
NEUTROS ABS: 6.3 10*3/uL (ref 1.7–7.7)
NEUTROS PCT: 74 %
Platelets: 405 10*3/uL — ABNORMAL HIGH (ref 150–400)
RBC: 4.68 MIL/uL (ref 4.22–5.81)
RDW: 13.5 % (ref 11.5–15.5)
WBC: 8.6 10*3/uL (ref 4.0–10.5)

## 2017-04-03 LAB — BASIC METABOLIC PANEL
ANION GAP: 10 (ref 5–15)
BUN: 26 mg/dL — ABNORMAL HIGH (ref 6–20)
CALCIUM: 9.5 mg/dL (ref 8.9–10.3)
CO2: 23 mmol/L (ref 22–32)
Chloride: 103 mmol/L (ref 101–111)
Creatinine, Ser: 1.02 mg/dL (ref 0.61–1.24)
Glucose, Bld: 228 mg/dL — ABNORMAL HIGH (ref 65–99)
Potassium: 4.1 mmol/L (ref 3.5–5.1)
SODIUM: 136 mmol/L (ref 135–145)

## 2017-04-03 NOTE — Progress Notes (Signed)
CSW reached out again to pt's daughter and son and left VM's once again. Pt was present with CSW when the second rounds of calls were made to family members in pt's room.Per RN pt is ready for discharge. CSW provided pt with homeless shelter resources. CSW spoke with EDP and she confirmed that pt is up for discharge and is agreeable to pt having the shelter list as resources at this time.     [name] Kiyaan Haq, MSW, LCSW-A Emergency Department Clinical Social Worker [phone]

## 2017-04-03 NOTE — ED Notes (Signed)
Social Work in to speak with pt. Waiting for call-back from daughter.

## 2017-04-03 NOTE — ED Provider Notes (Signed)
[facility]-EMER[facility] DEPT Provider Note   CSN: [id number] Arrival date & time: 04/03/17  0902     History   Chief Complaint Chief Complaint  Patient presents with  . Altered Mental Status    HPI Bryan Farrell is a 66 y.o. male.  Pt brought to the ED today due to altered mental status.  The pt is homeless and has dementia.  He was found in a field this morning.  He does not recall how he got there.  The pt has been seen in various [facility] EDs On August 4, 5, 9, 17, 18, and 20 for different complaints stemming from his recent homeless status.  He last ate while he was in the ED at [facility] yesterday.  It rained last night and pt's clothes are wet.  He does not have any pain, just feels a little cold.      Past Medical History:  Diagnosis Date  . Diabetes mellitus without complication (HCC)   . Hypertension     Patient Active Problem List   Diagnosis Date Noted  . Vitamin B12 deficiency 03/25/2017  . Vascular dementia 03/23/2017  . Suicidal ideation 03/23/2017  . Homelessness 03/23/2017  . Diabetes (HCC) 03/23/2017  . 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 TONSILLECTOMY         Home Medications    Prior to Admission medications   Medication Sig Start Date End Date Taking? Authorizing Provider  cyanocobalamin 1000 MCG tablet Take 1 tablet (1,000 mcg total) by mouth daily. Patient not taking: Reported on 03/31/2017 03/29/17   Pucilowska, Braulio Conte B, MD  FLUoxetine (PROZAC) 20 MG capsule Take 1 capsule (20 mg total) by mouth at bedtime. Patient not taking: Reported on 03/31/2017 03/28/17   Pucilowska, Braulio Conte B, MD  metFORMIN (GLUCOPHAGE) 500 MG tablet Take 1 tablet (500 mg total) by mouth 2 (two) times daily with a meal. Patient not taking: Reported on 03/31/2017 03/28/17   Pucilowska, Ellin Goodie, MD  traZODone (DESYREL) 100 MG tablet Take 1 tablet (100 mg total) by mouth at  bedtime. Patient not taking: Reported on 03/31/2017 03/28/17   Shari Prows, MD    Family History No family history on file.  Social History Social History  Substance Use Topics  . Smoking status: Never Smoker  . Smokeless tobacco: Never Used  . Alcohol use No     Comment: BAC clear     Allergies   Patient has no known allergies.   Review of Systems Review of Systems  Constitutional: Positive for fatigue.  All other systems reviewed and are negative.    Physical Exam Updated Vital Signs BP 132/88 (BP Location: Left Arm)   Pulse 86   Temp 98.6 F (37 C) (Oral)   Resp 14   Ht 6' (1.829 m)   Wt 104.3 kg (230 lb)   SpO2 98%   BMI 31.19 kg/m   Physical Exam  Constitutional: He appears well-developed and well-nourished.  HENT:  Head: Normocephalic and atraumatic.  Right Ear: External ear normal.  Left Ear: External ear normal.  Nose: Nose normal.  Mouth/Throat: Oropharynx is clear and moist.  Eyes: Pupils are equal, round, and reactive to light. Conjunctivae and EOM are normal.  Neck: Normal range of motion. Neck supple.  Cardiovascular: Normal rate, regular rhythm, normal heart sounds and intact distal pulses.   Pulmonary/Chest: Effort normal and breath sounds normal.  Abdominal: Soft. Bowel sounds  are normal.  Musculoskeletal: Normal range of motion.  Neurological: He is alert.  Pt is alert, but does not know the day or year.  Skin: Skin is warm.  Psychiatric: He has a normal mood and affect. His behavior is normal. Judgment and thought content normal.  Nursing note and vitals reviewed.    ED Treatments / Results  Labs (all labs ordered are listed, but only abnormal results are displayed) Labs Reviewed  BASIC METABOLIC PANEL - Abnormal; Notable for the following:       Result Value   Glucose, Bld 228 (*)    BUN 26 (*)    All other components within normal limits  CBC WITH DIFFERENTIAL/PLATELET - Abnormal; Notable for the following:     Platelets 405 (*)    All other components within normal limits  URINALYSIS, ROUTINE W REFLEX MICROSCOPIC    EKG  EKG Interpretation None       Radiology No results found.  Procedures Procedures (including critical care time)  Medications Ordered in ED Medications - No data to display   Initial Impression / Assessment and Plan / ED Course  I have reviewed the triage vital signs and the nursing notes.  Pertinent labs & imaging results that were available during my care of the patient were reviewed by me and considered in my medical decision making (see chart for details).    Pt d/w SW to see if there was any way we can get this patient into a safe environment.  We are waiting to hear from his daughter.  Unfortunately, the family does not want to care for patient.  Pt does not meet inpatient criteria.  We have to discharge him.  I asked SW to help and they were unable to do much for patient other than refer to shelters.  Final Clinical Impressions(s) / ED Diagnoses   Final diagnoses:  Homeless single person    New Prescriptions New Prescriptions   No medications on file     Jacalyn Lefevre, MD 04/03/17 1526

## 2017-04-03 NOTE — ED Notes (Signed)
SW states pt does not qualify for nursing home care and family is apparently not willing to take in pt. Pt to be given resources for shelters and services.

## 2017-04-03 NOTE — ED Notes (Signed)
Patient with social work, patient cannot stay because no medical reason. Patient has resources for shelters

## 2017-04-03 NOTE — ED Notes (Signed)
Ambulated the pt to the shower.  Pt was unsteady while walking and very unsteady while standing to shower.  Pt required assistance to maintain balance.  Pt has slipped in the shower previously and verbalized that he did not feel comfortable without my assistance.  Pt unable to perform some ADLs.  Pt required moderate assistance while ambulating back to the room.

## 2017-04-03 NOTE — ED Notes (Signed)
Patient given clothes and sleeping bag

## 2017-04-03 NOTE — ED Notes (Signed)
Pt watching TV. Appears comfortable.

## 2017-04-03 NOTE — ED Triage Notes (Addendum)
Pt arrives from field via [facility] reporting memory loss, possible LOC.  Pt found in wet clothes, unable to state how long he has been in the field or when his last meal was.  VSS, CBG 219, NAD noted at this time.  Dr. Particia Nearing at bedside.

## 2017-04-03 NOTE — Progress Notes (Signed)
CSW spoke with pt at bedside. Pt informed CSW that pt is homeless and doesn't have a place to stay at the time. CSW asked pt about relatives that pt could stay with and pt only suggested a daughter [name]) and a son Iantha Fallen). CSW reached out to both children however no answer. CSW spoke with Clinical Supervisor and was informed that the type of insurance that pt has ([employer]) is an insurance that will only assist with mental health placement. From charts, CSW is unable to find any further source of insurance for pt at this time.CSW will verbally hand off to evening CSW if no family has contacted CSW back by 3pm.    [name]. Parthiv Mucci, MSW, LCSW-A Emergency Department Clinical Social Worker [phone]

## 2017-04-05 ENCOUNTER — Emergency Department (HOSPITAL_COMMUNITY)
Admission: EM | Admit: 2017-04-05 | Discharge: 2017-04-05 | Disposition: A | Discharge status: Home / Self Care | Payer: No Typology Code available for payment source | Source: Home / Self Care | Attending: Emergency Medicine | Admitting: Emergency Medicine

## 2017-04-05 ENCOUNTER — Emergency Department (HOSPITAL_COMMUNITY)
Admission: EM | Admit: 2017-04-05 | Discharge: 2017-04-06 | Disposition: A | Discharge status: Home / Self Care | Payer: No Typology Code available for payment source | Attending: Emergency Medicine | Admitting: Emergency Medicine

## 2017-04-05 ENCOUNTER — Encounter (HOSPITAL_COMMUNITY): Payer: Self-pay | Admitting: Emergency Medicine

## 2017-04-05 DIAGNOSIS — I10 Essential (primary) hypertension: Secondary | ICD-10-CM | POA: Insufficient documentation

## 2017-04-05 DIAGNOSIS — F0390 Unspecified dementia without behavioral disturbance: Secondary | ICD-10-CM | POA: Insufficient documentation

## 2017-04-05 DIAGNOSIS — Z5900 Homelessness unspecified: Secondary | ICD-10-CM

## 2017-04-05 DIAGNOSIS — E119 Type 2 diabetes mellitus without complications: Secondary | ICD-10-CM | POA: Insufficient documentation

## 2017-04-05 DIAGNOSIS — R413 Other amnesia: Secondary | ICD-10-CM

## 2017-04-05 DIAGNOSIS — R410 Disorientation, unspecified: Secondary | ICD-10-CM

## 2017-04-05 LAB — CBG MONITORING, ED
GLUCOSE-CAPILLARY: 215 mg/dL — AB (ref 65–99)
Glucose-Capillary: 178 mg/dL — ABNORMAL HIGH (ref 65–99)

## 2017-04-05 MED ORDER — SODIUM CHLORIDE 0.9 % IV BOLUS (SEPSIS)
1000.0000 mL | Freq: Once | INTRAVENOUS | Status: AC
  Administered 2017-04-05: 1000 mL via INTRAVENOUS

## 2017-04-05 NOTE — ED Provider Notes (Signed)
[facility]-EMERGENCY DEPT Provider Note   CSN: [id number] Arrival date & time: 04/05/17  0230     History   Chief Complaint Chief Complaint  Patient presents with  . Memory Loss    HPI Bryan Farrell is a 66 y.o. male who presents with. PMH significant for homelessness, dementia, DM, HTN. He was found outside shivering by EMS. He states that he does have family in [place] but they don't want anything to do with him. He was previously living in a motel but ran out of money and was kicked out. He has not been able to stay at shelters because they are full. He denies fever or pain.   HPI  Past Medical History:  Diagnosis Date  . Diabetes mellitus without complication (HCC)   . Hypertension     Patient Active Problem List   Diagnosis Date Noted  . Vitamin B12 deficiency 03/25/2017  . Vascular dementia 03/23/2017  . Suicidal ideation 03/23/2017  . Homelessness 03/23/2017  . Diabetes (HCC) 03/23/2017  . 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 TONSILLECTOMY         Home Medications    Prior to Admission medications   Medication Sig Start Date End Date Taking? Authorizing Provider  cyanocobalamin 1000 MCG tablet Take 1 tablet (1,000 mcg total) by mouth daily. Patient not taking: Reported on 03/31/2017 03/29/17   Pucilowska, Braulio Conte B, MD  FLUoxetine (PROZAC) 20 MG capsule Take 1 capsule (20 mg total) by mouth at bedtime. Patient not taking: Reported on 03/31/2017 03/28/17   Pucilowska, Braulio Conte B, MD  metFORMIN (GLUCOPHAGE) 500 MG tablet Take 1 tablet (500 mg total) by mouth 2 (two) times daily with a meal. Patient not taking: Reported on 03/31/2017 03/28/17   Pucilowska, Ellin Goodie, MD  traZODone (DESYREL) 100 MG tablet Take 1 tablet (100 mg total) by mouth at bedtime. Patient not taking: Reported on 03/31/2017 03/28/17   Shari Prows, MD    Family History No  family history on file.  Social History Social History  Substance Use Topics  . Smoking status: Never Smoker  . Smokeless tobacco: Never Used  . Alcohol use No     Comment: BAC clear     Allergies   Patient has no known allergies.   Review of Systems Review of Systems  Constitutional: Negative for fever.       +Feels cold  Respiratory: Negative for shortness of breath.   Cardiovascular: Negative for chest pain.  Gastrointestinal: Negative for abdominal pain, nausea and vomiting.  Neurological: Negative for headaches.  Psychiatric/Behavioral: Positive for confusion.  All other systems reviewed and are negative.    Physical Exam Updated Vital Signs BP (!) 110/94   Pulse 95   Temp 98.5 F (36.9 C) (Oral)   Resp 17   SpO2 98%   Physical Exam  Constitutional: He is oriented to person, place, and time. He appears well-developed and well-nourished. No distress.  Disheveled male in NAD  HENT:  Head: Normocephalic and atraumatic.  Poor dentition  Eyes: Pupils are equal, round, and reactive to light. Conjunctivae are normal. Right eye exhibits no discharge. Left eye exhibits no discharge. No scleral icterus.  Neck: Normal range of motion.  Cardiovascular: Normal rate and regular rhythm.  Exam reveals no gallop and no friction rub.   No murmur heard. Pulmonary/Chest: Effort normal and breath sounds normal. No respiratory distress. He has no wheezes. He  has no rales. He exhibits no tenderness.  Abdominal: Soft. Bowel sounds are normal. He exhibits no distension and no mass. There is no tenderness. There is no rebound and no guarding. No hernia.  Neurological: He is alert and oriented to person, place, and time.  Can tell me his name, month, year, president  Skin: Skin is warm and dry.  Psychiatric: He has a normal mood and affect. His behavior is normal.  Nursing note and vitals reviewed.    ED Treatments / Results  Labs (all labs ordered are listed, but only abnormal  results are displayed) Labs Reviewed - No data to display  EKG  EKG Interpretation None       Radiology No results found.  Procedures Procedures (including critical care time)  Medications Ordered in ED Medications - No data to display   Initial Impression / Assessment and Plan / ED Course  I have reviewed the triage vital signs and the nursing notes.  Pertinent labs & imaging results that were available during my care of the patient were reviewed by me and considered in my medical decision making (see chart for details).  66 year old male presents with confusion which is chronic and feeling cold from staying outside from being homeless. He has been seen for this multiple times in the past. CBG ordered. SW consult placed.  9:19 AM SW in to eval patient. They will see if getting a TTS consult to evaluate capacity would be helpful. Otherwise they recommend going to [facility] downtown to see if he can apply for insurance to eventually seek placement.  SW has provided resources to patient. It seems he does not want to do much for himself which makes things more difficult. Will d/c.  Final Clinical Impressions(s) / ED Diagnoses   Final diagnoses:  Chronic confusion  Homeless    New Prescriptions New Prescriptions   No medications on file     Beryle Quant 04/05/17 1508    Little, Ambrose Finland, MD 04/05/17 2037

## 2017-04-05 NOTE — ED Triage Notes (Signed)
Per EMS, pt found shivering downtown by [facility], and stated he couldn't remember where he was supposed to be. Pt homeless. Per EMS, pt remembers previous visits to the ED, A&O x 4.

## 2017-04-05 NOTE — Progress Notes (Signed)
CSW met with pt at bedside to discuss concerns. At this time CSW has filled out the VI-SPDAT for pt to potentially assist pt in getting housing. CSW provided pt with resources to the [facility] in [place], as Shanon Brow at the facility mentioned that they may be able to assist pt with a room for the night. CSW gave Network engineer taxi voucher and made doctor aware that pt is good on CSW end. No further CSW needs.   Virgie Dad Sheehan Stacey, MSW, [place] Emergency Department Clinical Social Worker [phone]

## 2017-04-05 NOTE — ED Notes (Signed)
Pt given cab voucher and out patient resources

## 2017-04-05 NOTE — ED Provider Notes (Addendum)
[facility]-EMERGENCY DEPT Provider Note   CSN: [id number] Arrival date & time: 04/05/17  1652     History   Chief Complaint Chief Complaint  Patient presents with  . Dehydration    HPI Bryan Farrell is a 66 y.o. male with a history of diabetes, hypertension, dementia. He is homeless. The patient was seen and evaluated earlier today at [facility] emergency department for memory loss. He had a workup that showed some mildly elevated blood glucose, hyponatremia. He was medically cleared and has extensive social work consult. Patient was given cab vouchers and found a place in one of our shelters. After discharge, patient was found wandering the street, complaining of weakness. He states he does not know why he is here at the ER now. He was found to be orthostatic by EMS with a markedly elevated blood sugar. The patient states that he is unable to afford any medications. He is not taking any medicine for his blood sugars at this time.  HPI  Past Medical History:  Diagnosis Date  . Diabetes mellitus without complication (HCC)   . Hypertension     Patient Active Problem List   Diagnosis Date Noted  . Vitamin B12 deficiency 03/25/2017  . Vascular dementia 03/23/2017  . Suicidal ideation 03/23/2017  . Homelessness 03/23/2017  . Diabetes (HCC) 03/23/2017  . 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 TONSILLECTOMY         Home Medications    Prior to Admission medications   Not on File    Family History No family history on file.  Social History Social History  Substance Use Topics  . Smoking status: Never Smoker  . Smokeless tobacco: Never Used  . Alcohol use No     Comment: BAC clear     Allergies   Patient has no known allergies.   Review of Systems Review of Systems  Unable to perform ROS: Dementia     Physical Exam Updated Vital Signs BP 115/82 (BP  Location: Right Arm)   Pulse 93   Temp 99.1 F (37.3 C) (Oral)   Resp 16   SpO2 96%   Physical Exam  Constitutional: He appears well-developed and well-nourished. No distress.  HENT:  Head: Normocephalic and atraumatic.  Eyes: Conjunctivae are normal. No scleral icterus.  Neck: Normal range of motion. Neck supple.  Cardiovascular: Normal rate, regular rhythm and normal heart sounds.   Pulmonary/Chest: Effort normal and breath sounds normal. No respiratory distress.  Abdominal: Soft. There is no tenderness.  Musculoskeletal: He exhibits no edema.  Neurological: He is alert.  confused  Skin: Skin is warm and dry. He is not diaphoretic.  Psychiatric: His behavior is normal.  Nursing note and vitals reviewed.    ED Treatments / Results  Labs (all labs ordered are listed, but only abnormal results are displayed) Labs Reviewed - No data to display  EKG  EKG Interpretation None       Radiology No results found.  Procedures Procedures (including critical care time)  Medications Ordered in ED Medications - No data to display   Initial Impression / Assessment and Plan / ED Course  I have reviewed the triage vital signs and the nursing notes.  Pertinent labs & imaging results that were available during my care of the patient were reviewed by me and considered in my medical decision making (see chart for details).     Patient given  fluids here in the ER. He will need a case management consult for his medications in the morning. Given the fact that he is not taking medicine for his diabetes. He is otherwise without medical emergency. I didn't sign out to PA [facility]  Final Clinical Impressions(s) / ED Diagnoses   Final diagnoses:  None    New Prescriptions New Prescriptions   No medications on file     Delos Haring 04/06/17 0148    Mancel Bale, MD 04/06/17 1558    Arthor Captain, PA-C 04/29/17 1620    Mancel Bale, MD 04/29/17  [phone]

## 2017-04-05 NOTE — Progress Notes (Signed)
CSW received call from CM who alerted the CSW to this patient requesting assistance.  This pt is well-known to both the [facility] ED and the [facility] ED for presenting with confusion and requesting assistance.  Pt was in the [facility] ED and was given a taxi voucher to [employer] in [place] today and later was brought in by EMS who stated pt informed them he was confused.  This patient was escorted out of the [facility] ED on Sunday 8/18 after refusing to leave and after this CSW offered to call the pt's ex-wife who has in the past, according to the notes and other CSW's offered to help the pt financially, pt has consistently refused to accept this offered financial help or allow a CSW to contact his ex-wife for him.  CSW called [facility] and spoke to a worker who stated there are no reservations that those needing a room are [employer].  Worker also stated they take no overnight stay people past 5pm. [facility] stated the person can arrive at 7:45 am on 8/24.    This is the noted from the [facility] ED CSW (this CSW) on 8/18:  This pt is known to this CSW due to his recent consult on the pt from the pt's last visit to the [facility] ED on 8/17.    Pt per notes, pt left the ED and was placed in the waiting room to leave, then apparently either left on 8/17 at 7:59 pm and returned following day (8/18) at 2:56pm c/o dizziness.  CSW's note from 8/17 at 7:59pm (last ED visit): CSW spoke to the pt's RN who reported that TTS had spoke with the pt and had cleared the pt psychiatrically. CSW spoke to the pt by phone and stated he had not yet stayed at the [facility] although notes stated he had reported being kicked out. Several times during the conversation when asked about the previous few day pt had replied, "I don't know", but when confronted with information from the notes regarding pt's stay at [facility] and the fact his ex-wife is offering to help the pt financially, the pt had  immediately replied as if he knew this information and stated, he "was not comfortable at this time", with letting his ex-wife help him out financially. When asked about his Lucianne Lei the pt had immediately replied "I don't know if it is still there, or not"  Per notes pt has been diagnosed with vascular dementia, as evidenced by a CT scan.  CSW encouraged pt to allow his ex-wife to assist him, but pt again refused and CSW offered pt a shelter list.  Pt accepted the list via phone and faxed said list to pt's RN to be provided to the pt. 10:00 AM  CSW updated EDP, CSW will speak to pt and offer shelter list. 10:52 AM CSW met with pt and repeated CSW's earlier conversation with the pt on 8/17 and reiterated that the CSW was willing to assist the pt in contacting the pt's ex-wife who is willing to assist the pt financially, per the notes.  CSW informed pt pt's only alternative at this time was to be provided with a shelter list. Pt presented as if he were attempting to make up his mind to let the CSW assist in calling his ex-wife and after several minutes the CSW counseld the pt in the efficacy of accepting help, affirmed the pt had the right to self-determination, and then encourafed the  pt again to allow the CSW to assist the pt.  Pt refused to allow the CSW to contact his ex-wife.   CSW then provided pt with a shelter list with directions on how to request help from the bus driver on reaching the shelter of his choice.  CSW again offered to reach out to the pt's ex-wife with the pt's permission and the pt refused.  CSW counseled pt that if a care plan was put into place at [facility] he would be well-known to [place] as arriving to the E.D. In order to be housed, unless there was a treatable medical condition and for the pt to reasonably expect to be "lodged" at the hospital for a lack of a "place to go". Pt indicated he understood and CSW again offered to assist the pt and the pt did not  give verbal permission. CSW updated the RN. Please reconsult if future social work needs arise.  CSW signing off, as social work intervention is no longer needed.   6:13 PM on 8/23:  CSW following up on pt in [place].  Consult request has been received. CSW attempting to follow up at present time.  Alphonse Guild. Llewellyn Schoenberger, Reed Pandy, CSI Clinical Social Worker Ph: [phone]

## 2017-04-05 NOTE — ED Notes (Signed)
Social work at bedside.  

## 2017-04-05 NOTE — Clinical Social Work Note (Signed)
Clinical Social Work Assessment  Patient Details  Name: Bryan Farrell MRN: [id number] Date of Birth: [birth year]  Date of referral:  04/05/17               Reason for consult:  Facility Placement, Housing Concerns/Homelessness                Permission sought to share information with:  Family Supports, Chartered certified accountant granted to share information::  Yes, Verbal Permission Granted  Name::        Agency::     Relationship::     Contact Information:     Housing/Transportation Living arrangements for the past 2 months:  Homeless Source of Information:  Patient, Spouse (Ex-spouse) Patient Interpreter Needed:  None Criminal Activity/Legal Involvement Pertinent to Current Situation/Hospitalization:    Significant Relationships:  None (Hasson and daughter but they will not help) Lives with:  Self Do you feel safe going back to the place where you live?  No Need for family participation in patient care:  No (Coment)  Care giving concerns:  None listed by pt except he insists he is not oriented, Per notes, pt was at [facility] at approx 8/10-8/16, D/C'd then presented and was at [facility] ED on 8/17, 8/18, 8/19, 8/21,and 8/23, D/C'd from [facility] ED on 8/23 after being given a taxi voucher to [employer] in [place]., and then EMS brought pt to [facility] ED on 8/23.  CSW has no more resources for pt, will be kept overnight due to dehydration and blood sugar regulation and then D/C'd the morning of 8/24. Pt gives multiple reasons why resources provided will not ne effective, usually citing how badly he has been treated by others. Pt states his children SHOULD let him live with them, but children have repeatedly told CSW's they will not and ex-wife is now re-married.  Social Worker assessment / plan:  CSW met with pt and confirmed pt's plan to be discharged to shelter to live.  CSW provided active listening and validated pt's concerns he will not get the resources he needs and  prefers to stay at the ED. Pt has been homeless, but has had multiple ED stays, prior to being admitted to [facility].   Employment status:  Unemployed Forensic scientist:  Self Pay (Medicaid Pending) PT Recommendations:  Not assessed at this time Information / Referral to community resources:     Patient/Family's Response to care:  Patient alert and oriented.  Patient not agreeable to plan.  Pt's family not supportive and strongly involved in pt.'s care.    Patient/Family's Understanding of and Emotional Response to Diagnosis, Current Treatment, and Prognosis:  Still assessing   Emotional Assessment Appearance:  Appears stated age Attitude/Demeanor/Rapport:  Complaining, Lethargic, Inconsistent, Guarded Affect (typically observed):  Flat Orientation:  Oriented to Self, Oriented to Place, Oriented to  Time, Oriented to Situation (Says he is not oriented but is very lucid when conversing with this CSW) Alcohol / Substance use:    Psych involvement (Current and /or in the community):     Discharge Needs  Concerns to be addressed:  Patient refuses services Readmission within the last 30 days:  Yes Current discharge risk:  Homeless, Lack of support system Barriers to Discharge:  No Barriers Identified   Claudine Mouton, LCSWA 04/05/2017, 10:15 PM

## 2017-04-05 NOTE — Progress Notes (Addendum)
CSW received verbal permission from the pt to contact his wife Sunni.  Per notes, pt's wife's ph number is: [phone].    9:02 PM CSW spoke to pt's wife Sunni who stated that the pt began to state he at times lost his memory approx a year ago.  Pt's wife requested the pt give her his social security number so the pt's ex-wife can assist the pt in filing for social security on his behalf.  CSW stated he will ask the pt if the pt wishes to do so.  When asked by the CSW if pt's ex-wife can assist the pt financially the pt's wife stated she can no longer do that because she is married.  CSW stated he will facilitate the pt with speaking to his wife.   9:38 PM Pt refused to speak to his ex-wife about any matter saying, "I'm afraid we will get into a yelling match" and refused to allow his ex-wife assist him in applying for social security stating, "I'm only 65 in two years I will get twice as much".  CSW encouraged the pt to consistently attend the shelter and access the resources the shelter ([facility]) and the [facility]) offer.  Pt stated he knows the [facility] well, but gave multiple reasons why the shelter and the [facility] cannot help him and has not helped him in the past.  When asked if the pt had considered applying for social security the pt replied, "I don't think I am disabled in any way".  CSW can no longer offer the pt any additional resources, but will leave another shelter list with the pt's RN to be placed in the pt's chart for D/C.  Per provider pt will be in [facility] ED overnight to due to hydration and blood sugar regulation.  CSW will leave handoff for daytime ED CSW.  Please reconsult if future social work needs arise.  CSW signing off, as social work intervention is no longer needed.   Dorothe Pea. Deegan Valentino, LCSWA, Darcey Nora, CSI Clinical Social Worker Ph: [phone]

## 2017-04-05 NOTE — ED Notes (Signed)
Bed: [facility] Expected date:  Expected time:  Means of arrival:  Comments: 

## 2017-04-05 NOTE — ED Notes (Signed)
Kelly PA at bedside   

## 2017-04-05 NOTE — ED Triage Notes (Signed)
Pt states he was walking down the street and felt fatigued. Orthostatic w/ ems. CBG 342. 500cc given en route IV. Alert and oriented.

## 2017-04-05 NOTE — Progress Notes (Addendum)
CSW staffed case with asst director and to summarize from previous note, pt was at [facility] ED and was D/C'd with a taxi voucher to [facility] with the promise of a bed this morning.  This CSW contacted [employer] who stated pt was dropped of at [facility] at 11:57am this morning 8/23.  This CSW spoke to [facility] today and was told pt could have a bed if pt arrives before 5pm, in most cases.  Per CM and notes pt was brought in by EMS stating he was confused.  This has been the standard reason for this pt returning, per the notes, when returning to the ED.  In past [facility] ED vists pt has presented to the ED then refused to leave when D/C'd, specifically on 8/18 when this CSW met with pt in the [facility] ED.  As stated in the previous note the pt's children refuse to assist the pt due to the pt "running through a $400,000 inheritance" while residing at the [facility] in [place] and then pt has ED and behavioral health staff repeatedly call his childre to ask for assistance and children (son and daughter) repeatedly refuse due to the pt making unwise life choices.  CSW's in the past, per notes, have heard pt's ex-wife in [place] offer to assist the pt financially and pt refused.  This CSW asked the pt to allow this CSW to call the pt's ex-wife to ask again for assistance and the pt has states, "I'm not comfortable with that right now".  CSW has then offered the pt shelter list and a bus pass and pt has returned from the shelter to the [facility] ED.   Pt refused to leave the [facility] EC on 8/18 and then on 8/23 [facility] ED assisted the pt with filling out a housing form and D/C'd the pt to [facility], then pt returned the [facility] ED today.   CSW will continue to follow for D/C needs.  Alphonse Guild. Kyrese Gartman, LCSWA, Deon Pilling, CSI Clinical Social Worker Ph: [phone]

## 2017-04-06 LAB — CBG MONITORING, ED
Glucose-Capillary: 176 mg/dL — ABNORMAL HIGH (ref 65–99)
Glucose-Capillary: 184 mg/dL — ABNORMAL HIGH (ref 65–99)

## 2017-04-06 NOTE — ED Notes (Addendum)
During the discharge pt yells at this nurse that he has nowhere to go and he will just go outside and wait until he passes out from dehydration. Pt advised that he should follow up with resources provided on his discharge paperwork, he says they have done nothing for him so far. Explained to pt that there is nothing we can do for him in the Emergency Department at this time, since he has no emergent medical needs. Will page security to escort the pt out.

## 2017-04-06 NOTE — ED Notes (Signed)
Breakfast provided, pt is resting, denies pain.

## 2017-04-10 ENCOUNTER — Emergency Department (HOSPITAL_COMMUNITY)
Admission: EM | Admit: 2017-04-10 | Discharge: 2017-04-11 | Disposition: A | Discharge status: Home / Self Care | Payer: No Typology Code available for payment source | Attending: Emergency Medicine | Admitting: Emergency Medicine

## 2017-04-10 ENCOUNTER — Encounter (HOSPITAL_COMMUNITY): Payer: Self-pay | Admitting: Obstetrics and Gynecology

## 2017-04-10 DIAGNOSIS — Z590 Homelessness: Secondary | ICD-10-CM | POA: Insufficient documentation

## 2017-04-10 DIAGNOSIS — E119 Type 2 diabetes mellitus without complications: Secondary | ICD-10-CM | POA: Insufficient documentation

## 2017-04-10 DIAGNOSIS — E876 Hypokalemia: Secondary | ICD-10-CM | POA: Insufficient documentation

## 2017-04-10 DIAGNOSIS — I10 Essential (primary) hypertension: Secondary | ICD-10-CM | POA: Insufficient documentation

## 2017-04-10 DIAGNOSIS — R531 Weakness: Secondary | ICD-10-CM | POA: Insufficient documentation

## 2017-04-10 DIAGNOSIS — R45851 Suicidal ideations: Secondary | ICD-10-CM | POA: Insufficient documentation

## 2017-04-10 LAB — CBC
HEMATOCRIT: 36.1 % — AB (ref 39.0–52.0)
Hemoglobin: 12.8 g/dL — ABNORMAL LOW (ref 13.0–17.0)
MCH: 30 pg (ref 26.0–34.0)
MCHC: 35.5 g/dL (ref 30.0–36.0)
MCV: 84.7 fL (ref 78.0–100.0)
PLATELETS: 305 10*3/uL (ref 150–400)
RBC: 4.26 MIL/uL (ref 4.22–5.81)
RDW: 13.4 % (ref 11.5–15.5)
WBC: 9.6 10*3/uL (ref 4.0–10.5)

## 2017-04-10 LAB — BASIC METABOLIC PANEL
Anion gap: 6 (ref 5–15)
BUN: 22 mg/dL — ABNORMAL HIGH (ref 6–20)
CALCIUM: 7.8 mg/dL — AB (ref 8.9–10.3)
CO2: 22 mmol/L (ref 22–32)
CREATININE: 0.91 mg/dL (ref 0.61–1.24)
Chloride: 108 mmol/L (ref 101–111)
Glucose, Bld: 181 mg/dL — ABNORMAL HIGH (ref 65–99)
Potassium: 2.7 mmol/L — CL (ref 3.5–5.1)
SODIUM: 136 mmol/L (ref 135–145)

## 2017-04-10 MED ORDER — POTASSIUM CHLORIDE CRYS ER 20 MEQ PO TBCR
60.0000 meq | EXTENDED_RELEASE_TABLET | Freq: Once | ORAL | Status: AC
  Administered 2017-04-11: 60 meq via ORAL
  Filled 2017-04-10: qty 3

## 2017-04-10 MED ORDER — SODIUM CHLORIDE 0.9 % IV BOLUS (SEPSIS)
1000.0000 mL | Freq: Once | INTRAVENOUS | Status: AC
  Administered 2017-04-10: 1000 mL via INTRAVENOUS

## 2017-04-10 NOTE — ED Triage Notes (Signed)
Per EMS: [place]. Pt is homeless. Pt reports he is suicidal and felt faint. Pt has not had much to eat or drink as he has been trying to starve himself.  Pt reports he was trying to die of thirst or starvation.  Last Vitals: 118/78 HR 92 RR 16 OW 98% on RA CBG 227

## 2017-04-10 NOTE — ED Provider Notes (Signed)
[facility]-EMER[facility] DEPT Provider Note   CSN: [id number] Arrival date & time: 04/10/17  2039     History   Chief Complaint Chief Complaint  Patient presents with  . Suicidal    HPI Bryan Farrell is a 66 y.o. male.  Patient with history of diabetes, hypertension, homelessness, frequent emergency department visits -- presents with complaint of suicidal ideation and lightheadedness. Patient has been recently seen for suicidal ideations, memory problems. He has been medically cleared. He has been seen by Child psychotherapist, case Production designer, theatre/television/film, [employer]. Today patient states he started feeling suicidal. He states that he feels he is serious about these thoughts, however cannot give me a plan. He states that he started walking to the hospital and felt lightheaded. Someone called EMS and he was transported to the hospital. He denies other medical complaints. He denies taking medications for his diabetes at the current time. He states that he is homeless and living outside. The onset of this condition was acute. The course is constant. Aggravating factors: none. Alleviating factors: none.        Past Medical History:  Diagnosis Date  . Diabetes mellitus without complication (HCC)   . Hypertension     Patient Active Problem List   Diagnosis Date Noted  . Vitamin B12 deficiency 03/25/2017  . Vascular dementia 03/23/2017  . Suicidal ideation 03/23/2017  . Homelessness 03/23/2017  . Diabetes (HCC) 03/23/2017  . 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 TONSILLECTOMY         Home Medications    Prior to Admission medications   Not on File    Family History History reviewed. No pertinent family history.  Social History Social History  Substance Use Topics  . Smoking status: Never Smoker  . Smokeless tobacco: Never Used  . Alcohol use No     Comment: BAC clear      Allergies   Patient has no known allergies.   Review of Systems Review of Systems  Constitutional: Negative for fever.  HENT: Negative for rhinorrhea and sore throat.   Eyes: Negative for redness.  Respiratory: Negative for cough.   Cardiovascular: Negative for chest pain.  Gastrointestinal: Negative for abdominal pain, diarrhea, nausea and vomiting.  Genitourinary: Negative for dysuria.  Musculoskeletal: Negative for myalgias.  Skin: Negative for rash.  Neurological: Positive for light-headedness. Negative for headaches.  Psychiatric/Behavioral: Positive for confusion and suicidal ideas.     Physical Exam Updated Vital Signs BP 113/74 (BP Location: Left Arm)   Pulse 79   Temp 98.2 F (36.8 C) (Oral)   Resp 19   Ht 6' (1.829 m)   Wt 106.6 kg (235 lb)   SpO2 95%   BMI 31.87 kg/m   Physical Exam  Constitutional: He is oriented to person, place, and time. He appears well-developed and well-nourished.  HENT:  Head: Normocephalic and atraumatic.  Mouth/Throat: Oropharynx is clear and moist.  Eyes: Conjunctivae are normal. Right eye exhibits no discharge. Left eye exhibits no discharge.  Neck: Normal range of motion. Neck supple.  Cardiovascular: Normal rate, regular rhythm and normal heart sounds.   Pulmonary/Chest: Effort normal and breath sounds normal.  Abdominal: Soft. There is no tenderness.  Neurological: He is alert and oriented to person, place, and time. No cranial nerve deficit.  Skin: Skin is warm and dry.  Psychiatric: He has a normal mood and affect.  Nursing note and vitals reviewed.  ED Treatments / Results  Labs (all labs ordered are listed, but only abnormal results are displayed) Labs Reviewed  CBC - Abnormal; Notable for the following:       Result Value   Hemoglobin 12.8 (*)    HCT 36.1 (*)    All other components within normal limits  BASIC METABOLIC PANEL - Abnormal; Notable for the following:    Potassium 2.7 (*)    Glucose, Bld  181 (*)    BUN 22 (*)    Calcium 7.8 (*)    All other components within normal limits    EKG  EKG Interpretation None       Radiology No results found.  Procedures Procedures (including critical care time)  Medications Ordered in ED Medications  sodium chloride 0.9 % bolus 1,000 mL (0 mLs Intravenous Stopped 04/10/17 2330)  potassium chloride SA (K-DUR,KLOR-CON) CR tablet 60 mEq (60 mEq Oral Given 04/11/17 0004)     Initial Impression / Assessment and Plan / ED Course  I have reviewed the triage vital signs and the nursing notes.  Pertinent labs & imaging results that were available during my care of the patient were reviewed by me and considered in my medical decision making (see chart for details).     Patient seen and examined. Work-up initiated.   Vital signs reviewed and are as follows: BP 113/74 (BP Location: Left Arm)   Pulse 79   Temp 98.2 F (36.8 C) (Oral)   Resp 19   Ht 6' (1.829 m)   Wt 106.6 kg (235 lb)   SpO2 95%   BMI 31.87 kg/m   Potassium low. Repleted.   I woke patient and told him about his results. I asked him how he feels about leaving. He states 'I don't have anywhere to go.' I offered referrals to shelters. I asked him if he has any concerns and he reiterates that he doesn't have anywhere to go. Patient has been extensively evaluated here by [facility] and SW/CM. Low concern that he is actively suicidal. Feel social issues are driving his current situation, which have been addressed as much as possible here.   Discussed case and care with Dr. Elesa Massed. Feel patient can be discharged at this time.   Final Clinical Impressions(s) / ED Diagnoses   Final diagnoses:  Hypokalemia  Generalized weakness   D/c with referrals. Patient without concrete plan. States he is trying to starve himself -- appears well-hydrated with normal vitals/creatinine. Low potassium, but blood sugar and electrolytes otherwise appear normal. Feel he can be discharged with  referrals.   New Prescriptions New Prescriptions   No medications on file        Renne Crigler, Cordelia Poche 04/11/17 4098    Mesner, Barbara Cower, MD 04/11/17 1740

## 2017-04-11 ENCOUNTER — Emergency Department (HOSPITAL_COMMUNITY)
Admission: EM | Admit: 2017-04-11 | Discharge: 2017-04-11 | Disposition: A | Discharge status: Home / Self Care | Payer: No Typology Code available for payment source | Attending: Emergency Medicine | Admitting: Emergency Medicine

## 2017-04-11 DIAGNOSIS — I10 Essential (primary) hypertension: Secondary | ICD-10-CM | POA: Insufficient documentation

## 2017-04-11 DIAGNOSIS — R531 Weakness: Secondary | ICD-10-CM | POA: Insufficient documentation

## 2017-04-11 DIAGNOSIS — E119 Type 2 diabetes mellitus without complications: Secondary | ICD-10-CM | POA: Insufficient documentation

## 2017-04-11 LAB — CBC WITH DIFFERENTIAL/PLATELET
Basophils Absolute: 0 10*3/uL (ref 0.0–0.1)
Basophils Relative: 1 %
EOS ABS: 0.1 10*3/uL (ref 0.0–0.7)
EOS PCT: 1 %
HCT: 35.9 % — ABNORMAL LOW (ref 39.0–52.0)
HEMOGLOBIN: 12.7 g/dL — AB (ref 13.0–17.0)
Lymphocytes Relative: 22 %
Lymphs Abs: 1.4 10*3/uL (ref 0.7–4.0)
MCH: 30 pg (ref 26.0–34.0)
MCHC: 35.4 g/dL (ref 30.0–36.0)
MCV: 84.7 fL (ref 78.0–100.0)
MONO ABS: 0.4 10*3/uL (ref 0.1–1.0)
MONOS PCT: 6 %
Neutro Abs: 4.6 10*3/uL (ref 1.7–7.7)
Neutrophils Relative %: 70 %
Platelets: 263 10*3/uL (ref 150–400)
RBC: 4.24 MIL/uL (ref 4.22–5.81)
RDW: 13.3 % (ref 11.5–15.5)
WBC: 6.5 10*3/uL (ref 4.0–10.5)

## 2017-04-11 LAB — BASIC METABOLIC PANEL
Anion gap: 7 (ref 5–15)
BUN: 18 mg/dL (ref 6–20)
CALCIUM: 9 mg/dL (ref 8.9–10.3)
CO2: 25 mmol/L (ref 22–32)
CREATININE: 0.89 mg/dL (ref 0.61–1.24)
Chloride: 106 mmol/L (ref 101–111)
GLUCOSE: 171 mg/dL — AB (ref 65–99)
Potassium: 4.2 mmol/L (ref 3.5–5.1)
Sodium: 138 mmol/L (ref 135–145)

## 2017-04-11 LAB — MAGNESIUM: MAGNESIUM: 1.6 mg/dL — AB (ref 1.7–2.4)

## 2017-04-11 LAB — CBG MONITORING, ED: Glucose-Capillary: 162 mg/dL — ABNORMAL HIGH (ref 65–99)

## 2017-04-11 NOTE — ED Provider Notes (Signed)
[facility] Provider Note   CSN: [id number] Arrival date & time: 04/11/17  0901     History   Chief Complaint Chief Complaint  Patient presents with  . Fatigue    HPI Trevonne Farrell is a 66 y.o. male.  The history is provided by the patient. No language interpreter was used.   Bryan Farrell is a 66 y.o. male who presents to the Emergency Department complaining of weakness.  He presents via EMS for feelings of weakness and fatigue. Reports that at times when he walks he feels weak and just sits down and lays down on the ground due to generalized weakness. Symptoms have been ongoing for several weeks. He denies any headache, chest pain, shortness of breath, nausea, vomiting, fevers He does have a history of diabetes but has been unable to take his medications due to the inability to afford them. He is currently homeless but is unable to reside in a shelter. He denies any tobacco, alcohol, drug use. He denies any active SI or HI. He does state that he feels suicidal at times but not currently. Past Medical History:  Diagnosis Date  . Diabetes mellitus without complication (HCC)   . Hypertension     Patient Active Problem List   Diagnosis Date Noted  . Vitamin B12 deficiency 03/25/2017  . Vascular dementia 03/23/2017  . Suicidal ideation 03/23/2017  . Homelessness 03/23/2017  . Diabetes (HCC) 03/23/2017  . 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 TONSILLECTOMY         Home Medications    Prior to Admission medications   Not on File    Family History No family history on file.  Social History Social History  Substance Use Topics  . Smoking status: Never Smoker  . Smokeless tobacco: Never Used  . Alcohol use No     Comment: BAC clear     Allergies   Patient has no known allergies.   Review of Systems Review of Systems  All other  systems reviewed and are negative.    Physical Exam Updated Vital Signs BP (!) 116/91   Pulse 75   Temp 97.7 F (36.5 C) (Oral)   Resp 16   SpO2 100%   Physical Exam  Constitutional: He is oriented to person, place, and time. He appears well-developed and well-nourished.  HENT:  Head: Normocephalic and atraumatic.  Cardiovascular: Normal rate and regular rhythm.   No murmur heard. Pulmonary/Chest: Effort normal and breath sounds normal. No respiratory distress.  Abdominal: Soft. There is no tenderness. There is no rebound and no guarding.  Musculoskeletal: He exhibits no edema or tenderness.  Neurological: He is alert and oriented to person, place, and time.  5 out of 5 strength in all 4 extremities. Sensation to light touch intact in all 4 extremities  Skin: Skin is warm and dry.  Psychiatric:  Flat affect.  Denies SI/HI  Nursing note and vitals reviewed.    ED Treatments / Results  Labs (all labs ordered are listed, but only abnormal results are displayed) Labs Reviewed  BASIC METABOLIC PANEL - Abnormal; Notable for the following:       Result Value   Glucose, Bld 171 (*)    All other components within normal limits  CBC WITH DIFFERENTIAL/PLATELET - Abnormal; Notable for the following:    Hemoglobin 12.7 (*)    HCT 35.9 (*)    All other  components within normal limits  MAGNESIUM - Abnormal; Notable for the following:    Magnesium 1.6 (*)    All other components within normal limits  CBG MONITORING, ED - Abnormal; Notable for the following:    Glucose-Capillary 162 (*)    All other components within normal limits    EKG  EKG Interpretation None       Radiology No results found.  Procedures Procedures (including critical care time)  Medications Ordered in ED Medications - No data to display   Initial Impression / Assessment and Plan / ED Course  I have reviewed the triage vital signs and the nursing notes.  Pertinent labs & imaging results that  were available during my care of the patient were reviewed by me and considered in my medical decision making (see chart for details).     Patient here for evaluation of weakness. He endorses feeling fatigued. He has a nonfocal neurologic examination with no evidence of acute infectious process. He has a history of hypokalemia, normal potassium on today's evaluation. Presentation is consistent with ACS, PE, CVA. Discussed with patient importance of outpatient follow-up and return precautions.  Final Clinical Impressions(s) / ED Diagnoses   Final diagnoses:  Weakness    New Prescriptions There are no discharge medications for this patient.    Tilden Fossa, MD 04/11/17 [phone]

## 2017-04-11 NOTE — ED Triage Notes (Signed)
Pt BIB [facility] after general fatigue x several months. CBG 160. Pt was discharged earlier today from this facility. Alert and oriented.

## 2017-04-11 NOTE — Discharge Instructions (Signed)
Please read and follow all provided instructions.  Your diagnoses today include:  1. Hypokalemia   2. Generalized weakness     Tests performed today include:  Vital signs. See below for your results today.   Blood counts and electrolytes - low potassium level  Medications prescribed:   None   Home care instructions:  Follow any educational materials contained in this packet.  Follow-up instructions: Please follow-up with your primary care provider as needed for further evaluation of your symptoms.  Return instructions:   Please return to the Emergency Department if you experience worsening symptoms.   Please return if you have any other emergent concerns.  Additional Information:  Your vital signs today were: BP 113/74 (BP Location: Left Arm)    Pulse 79    Temp 98.2 F (36.8 C) (Oral)    Resp 19    Ht 6' (1.829 m)    Wt 106.6 kg (235 lb)    SpO2 95%    BMI 31.87 kg/m  If your blood pressure (BP) was elevated above 135/85 this visit, please have this repeated by your doctor within one month. ---------------

## 2017-04-11 NOTE — ED Notes (Signed)
Waiting on social work prior to discharge. Went over discharge papers with patient. Offered meal tray and drink of pt's choice, but pt refused.

## 2017-04-11 NOTE — ED Notes (Signed)
Gave pt shelter information from social work. Offered bus pass and drink, but pt refused. Also gave pt family phone numbers on file per pt request. Pt irritable at discharge.

## 2017-04-12 ENCOUNTER — Encounter (HOSPITAL_COMMUNITY): Payer: Self-pay | Admitting: Nurse Practitioner

## 2017-04-12 ENCOUNTER — Emergency Department (HOSPITAL_COMMUNITY)
Admission: EM | Admit: 2017-04-12 | Discharge: 2017-04-13 | Disposition: A | Discharge status: Home / Self Care | Payer: No Typology Code available for payment source | Attending: Emergency Medicine | Admitting: Emergency Medicine

## 2017-04-12 DIAGNOSIS — Z008 Encounter for other general examination: Secondary | ICD-10-CM

## 2017-04-12 DIAGNOSIS — Z91199 Patient's noncompliance with other medical treatment and regimen due to unspecified reason: Secondary | ICD-10-CM

## 2017-04-12 DIAGNOSIS — Z590 Homelessness: Secondary | ICD-10-CM | POA: Insufficient documentation

## 2017-04-12 DIAGNOSIS — Z9119 Patient's noncompliance with other medical treatment and regimen: Secondary | ICD-10-CM | POA: Insufficient documentation

## 2017-04-12 DIAGNOSIS — R45851 Suicidal ideations: Secondary | ICD-10-CM | POA: Insufficient documentation

## 2017-04-12 DIAGNOSIS — E119 Type 2 diabetes mellitus without complications: Secondary | ICD-10-CM | POA: Insufficient documentation

## 2017-04-12 DIAGNOSIS — F4321 Adjustment disorder with depressed mood: Secondary | ICD-10-CM | POA: Insufficient documentation

## 2017-04-12 DIAGNOSIS — Z046 Encounter for general psychiatric examination, requested by authority: Secondary | ICD-10-CM

## 2017-04-12 DIAGNOSIS — E1165 Type 2 diabetes mellitus with hyperglycemia: Secondary | ICD-10-CM

## 2017-04-12 DIAGNOSIS — Z9114 Patient's other noncompliance with medication regimen: Secondary | ICD-10-CM

## 2017-04-12 DIAGNOSIS — I10 Essential (primary) hypertension: Secondary | ICD-10-CM | POA: Insufficient documentation

## 2017-04-12 LAB — RAPID URINE DRUG SCREEN, HOSP PERFORMED
AMPHETAMINES: NOT DETECTED
BENZODIAZEPINES: NOT DETECTED
Barbiturates: NOT DETECTED
Cocaine: NOT DETECTED
OPIATES: NOT DETECTED
Tetrahydrocannabinol: NOT DETECTED

## 2017-04-12 LAB — CBC
HCT: 38.9 % — ABNORMAL LOW (ref 39.0–52.0)
Hemoglobin: 13.7 g/dL (ref 13.0–17.0)
MCH: 30 pg (ref 26.0–34.0)
MCHC: 35.2 g/dL (ref 30.0–36.0)
MCV: 85.3 fL (ref 78.0–100.0)
PLATELETS: 287 10*3/uL (ref 150–400)
RBC: 4.56 MIL/uL (ref 4.22–5.81)
RDW: 13.3 % (ref 11.5–15.5)
WBC: 6.4 10*3/uL (ref 4.0–10.5)

## 2017-04-12 LAB — COMPREHENSIVE METABOLIC PANEL
ALT: 15 U/L — ABNORMAL LOW (ref 17–63)
AST: 13 U/L — ABNORMAL LOW (ref 15–41)
Albumin: 4 g/dL (ref 3.5–5.0)
Alkaline Phosphatase: 100 U/L (ref 38–126)
Anion gap: 8 (ref 5–15)
BILIRUBIN TOTAL: 1.1 mg/dL (ref 0.3–1.2)
BUN: 13 mg/dL (ref 6–20)
CHLORIDE: 103 mmol/L (ref 101–111)
CO2: 25 mmol/L (ref 22–32)
Calcium: 9.1 mg/dL (ref 8.9–10.3)
Creatinine, Ser: 0.81 mg/dL (ref 0.61–1.24)
Glucose, Bld: 185 mg/dL — ABNORMAL HIGH (ref 65–99)
POTASSIUM: 3.5 mmol/L (ref 3.5–5.1)
Sodium: 136 mmol/L (ref 135–145)
TOTAL PROTEIN: 6.8 g/dL (ref 6.5–8.1)

## 2017-04-12 LAB — SALICYLATE LEVEL

## 2017-04-12 LAB — ETHANOL

## 2017-04-12 LAB — ACETAMINOPHEN LEVEL: Acetaminophen (Tylenol), Serum: <10 ug/mL — ABNORMAL LOW (ref 10–30)

## 2017-04-12 MED ORDER — ACETAMINOPHEN 325 MG PO TABS: 650.0000 mg | ORAL_TABLET | ORAL | Status: DC | PRN

## 2017-04-12 MED ORDER — ALUM & MAG HYDROXIDE-SIMETH 200-200-20 MG/5ML PO SUSP: 30.0000 mL | Freq: Four times a day (QID) | ORAL | Status: DC | PRN

## 2017-04-12 MED ORDER — ZOLPIDEM TARTRATE 5 MG PO TABS: 5.0000 mg | ORAL_TABLET | Freq: Every evening | ORAL | Status: DC | PRN

## 2017-04-12 MED ORDER — ONDANSETRON HCL 4 MG PO TABS: 4.0000 mg | ORAL_TABLET | Freq: Three times a day (TID) | ORAL | Status: DC | PRN

## 2017-04-12 MED ORDER — METFORMIN HCL 500 MG PO TABS
500.0000 mg | ORAL_TABLET | Freq: Two times a day (BID) | ORAL | Status: DC
  Administered 2017-04-12 – 2017-04-13 (×2): 500 mg via ORAL
  Filled 2017-04-12 (×2): qty 1

## 2017-04-12 NOTE — Progress Notes (Signed)
Per notes: "Per TTS. Recommendations are for AM psych eval."  CSW will leave handoff for daytime ED CSW.  Please reconsult if future social work needs arise.  CSW signing off, as social work intervention is no longer needed during PM shift.  Dorothe PeaJonathan F. Jakyle Petrucelli, LCSWA, Darcey NoraLCAS, CSI Clinical Social Worker Ph: [phone]

## 2017-04-12 NOTE — ED Notes (Signed)
Per TTS. Recommendations are for AM psych eval.

## 2017-04-12 NOTE — ED Provider Notes (Signed)
[facility] Provider Note   CSN: [id number] Arrival date & time: 04/12/17  1624     History   Chief Complaint No chief complaint on file.   HPI Bryan Farrell is a 66 y.o. male with a PMHx of homelessness, DM2, HTN, depression, Vit B12 deficiency, and vascular dementia, who [facility] ED [facility]. Per IVC paperwork, "patient complains of suicidal ideations and depression which he attributes to his 2 adult children not caring for him and his being homeless without any social support". Pt states that he made a suicidal threat, although he states he's not actively suicidal now, but endorses persistent passive suicidal ideations. He admits that [facility]. He states he feels "unhappy and depressed", reports that the last time he spoke to his son, he had an argument. He feels "lonely" because of his lack of support from his family. He denies HI/AVH, drug use, EtOH use, or tobacco use. Denies being on any medications, however states he was previously rx'd something for his diabetes during one of his prior hospitalizations but he never started taking it. Chart review reveals he was prescribed Metformin 500mg  BID earlier this month. He denies being on any psychiatric medications, and it's unclear if he's supposed to be on anything at this time. He denies any medical complaints at this time, and is here under IVC however he is very pleasant and cooperative with staff.   Of note, per chart review, pt has been evaluated in the ED 10 times in the month of August, multiple times for suicidal thoughts. Most recent visits were yesterday and the day before, he had some basic labs done, however he was not having active SI so no TTS consult done; he was last evaluated by TTS on 04/02/17 and did not meet inpatient criteria at that time, however [facility] in the [facility]    The history is provided by the patient and medical records.  No language interpreter was used.  Mental Health Problem  Presenting symptoms: depression and suicidal thoughts   Presenting symptoms: no hallucinations   Patient accompanied by:  Law enforcement Onset quality:  Gradual Timing:  Constant Progression:  Waxing and waning Chronicity:  Recurrent Context: noncompliance and stressful life event   Treatment compliance:  Untreated Relieved by:  None tried Worsened by:  Family interactions Ineffective treatments:  None tried Associated symptoms: no abdominal pain and no chest pain   Risk factors: hx of mental illness and recent psychiatric admission     Past Medical History:  Diagnosis Date  . Diabetes mellitus without complication (HCC)   . Hypertension     Patient Active Problem List   Diagnosis Date Noted  . Vitamin B12 deficiency 03/25/2017  . Vascular dementia 03/23/2017  . Suicidal ideation 03/23/2017  . Homelessness 03/23/2017  . Diabetes (HCC) 03/23/2017  . 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 TONSILLECTOMY         Home Medications    Prior to Admission medications   Not on File    Family History History reviewed. No pertinent family history.  Social History Social History  Substance Use Topics  . Smoking status: Never Smoker  . Smokeless tobacco: Never Used  . Alcohol use No     Comment: BAC clear     Allergies   Patient has no known  allergies.   Review of Systems Review of Systems  Constitutional: Negative for chills and fever.  Respiratory: Negative for shortness of breath.   Cardiovascular: Negative for chest pain.  Gastrointestinal: Negative for abdominal pain, constipation, diarrhea, nausea and vomiting.  Genitourinary: Negative for dysuria and hematuria.  Musculoskeletal: Negative for arthralgias and myalgias.  Skin: Negative for color change.  Allergic/Immunologic: Positive for  immunocompromised state (DM2).  Neurological: Negative for weakness and numbness.  Psychiatric/Behavioral: Positive for suicidal ideas. Negative for confusion and hallucinations.   All other systems reviewed and are negative for acute change except as noted in the HPI.    Physical Exam Updated Vital Signs BP 106/79 (BP Location: Right Arm)   Pulse 77   Temp 97.9 F (36.6 C) (Oral)   Resp 18   Ht 6' (1.829 m)   Wt 106.6 kg (235 lb)   SpO2 100%   BMI 31.87 kg/m   Physical Exam  Constitutional: He is oriented to person, place, and time. Vital signs are normal. He appears well-developed and well-nourished.  Non-toxic appearance. No distress.  Afebrile, nontoxic, NAD  HENT:  Head: Normocephalic and atraumatic.  Mouth/Throat: Oropharynx is clear and moist and mucous membranes are normal.  Eyes: Conjunctivae and EOM are normal. Right eye exhibits no discharge. Left eye exhibits no discharge.  Neck: Normal range of motion. Neck supple.  Cardiovascular: Normal rate, regular rhythm, normal heart sounds and intact distal pulses.  Exam reveals no gallop and no friction rub.   No murmur heard. Pulmonary/Chest: Effort normal and breath sounds normal. No respiratory distress. He has no decreased breath sounds. He has no wheezes. He has no rhonchi. He has no rales.  Abdominal: Soft. Normal appearance and bowel sounds are normal. He exhibits no distension. There is no tenderness. There is no rigidity, no rebound, no guarding, no CVA tenderness, no tenderness at McBurney's point and negative Murphy's sign.  Musculoskeletal: Normal range of motion.  R wrist splinted with velcro splint  Neurological: He is alert and oriented to person, place, and time. He has normal strength. No sensory deficit.  A&O x4, no focal neuro deficits  Skin: Skin is warm, dry and intact. No rash noted.  Psychiatric: He is not actively hallucinating. He exhibits a depressed mood. He expresses suicidal ideation. He expresses  no homicidal ideation. He expresses no suicidal plans and no homicidal plans.  Depressed affect, but pleasant and cooperative. Endorsing passive SI without a plan, denies HI or AVH, doesn't seem to be responding to internal stimuli.   Nursing note and vitals reviewed.    ED Treatments / Results  Labs (all labs ordered are listed, but only abnormal results are displayed) Labs Reviewed  COMPREHENSIVE METABOLIC PANEL - Abnormal; Notable for the following:       Result Value   Glucose, Bld 185 (*)    AST 13 (*)    ALT 15 (*)    All other components within normal limits  ACETAMINOPHEN LEVEL - Abnormal; Notable for the following:    Acetaminophen (Tylenol), Serum <10 (*)    All other components within normal limits  CBC - Abnormal; Notable for the following:    HCT 38.9 (*)    All other components within normal limits  ETHANOL  SALICYLATE LEVEL  RAPID URINE DRUG SCREEN, HOSP PERFORMED    EKG  EKG Interpretation  Date/Time:  Thursday April 12 2017 17:43:33 EDT Ventricular Rate:  65 PR Interval:  160 QRS Duration: 78 QT Interval:  430 QTC Calculation:  447 R Axis:   -13 Text Interpretation:  Normal sinus rhythm Normal ECG No significant change was found Confirmed by Shaune Pollack [id number]) on 04/12/2017 6:57:47 PM       Radiology No results found.  Procedures Procedures (including critical care time)  Medications Ordered in ED Medications  metFORMIN (GLUCOPHAGE) tablet 500 mg (not administered)  acetaminophen (TYLENOL) tablet 650 mg (not administered)  zolpidem (AMBIEN) tablet 5 mg (not administered)  ondansetron (ZOFRAN) tablet 4 mg (not administered)  alum & mag hydroxide-simeth (MAALOX/MYLANTA) 200-200-20 MG/5ML suspension 30 mL (not administered)     Initial Impression / Assessment and Plan / ED Course  I have reviewed the triage vital signs and the nursing notes.  Pertinent labs & imaging results that were available during my care of the patient were reviewed  by me and considered in my medical decision making (see chart for details).     66 y.o. male here under IVC for a suicidal threat he made at [employer]. Pt states passive SI, denies plan. Denies HI, AVH, drugs/EtOH, or tobacco use. He states he's unhappy and depressed, stressed about his children and arguments he's had with them. Exam unremarkable aside from depressed affect, A&Ox4, no concerning exam findings. Denies having any meds, however he's diabetic; states he was given a med before but never took it; review of meds looks like he was supposed to be on metformin 500mg  BID. No acute concerns and no medical complaints. Will get screening labs and reassess after.   6:58 PM EKG done for unknown reason, but is unremarkable. EtOH level undetectable. CBC WNL. CMP with hyperglycemia similar to prior visits, will start on metformin as he was supposed to be on. Salicylate and acetaminophen levels WNL. UDS pending, but does not interfere with med clearance. Pt medically cleared at this time. Psych hold orders and home med orders placed. Please see TTS notes for further documentation of care/dispo. Pt stable at time of med clearance.     Final Clinical Impressions(s) / ED Diagnoses   Final diagnoses:  Suicidal ideation  Involuntary commitment  Medical clearance for psychiatric admission  Noncompliance with diabetes treatment  Noncompliance with medication regimen  Type 2 diabetes mellitus with hyperglycemia, without long-term current use of insulin [facility])    New Prescriptions New Prescriptions   No medications on [address], [place], [place] 04/12/17 Dayton Scrape, MD 04/13/17 1201

## 2017-04-12 NOTE — ED Triage Notes (Signed)
[facility] called due to the patient stating he was suicidal.

## 2017-04-12 NOTE — BH Assessment (Addendum)
Assessment Note  Bryan Farrell is an 66 y.o. male, who presents involuntary and unaccompanied to [facility]. Clinician asked the pt, "what brought you to the hospital tonight?" Pt reported, "I don''t recall if I attempted suicide, or suicidal ideations." Pt reported, a previous suicide attempt by trying to hang himself. Pt reported, he is a little suicidal, with no plan. Pt reported, "I asked my kids to take care of me or take me in, if I get one more rejection I will find a interstate and run around." Pt reported, "my children have rejected me, they both won't take me in." Pt denied, HI, AVH, self-injurious behaviors and access to weapons.   Pt was IVC'd by a doctor at [employer]. Per IVC paperwork: "Patient complains of suicidal ideations and depression which contributes to his two adult children not caring for him and being homeless without any social support."  Pt denied history of abuse. Pt denied substance use. Pt's UDS is negative. Pt denied, being linked to OPT resources (medicaion management and/or counseling.) Pt denied previous inpatient admissions. Per pt's chart he was admitted to [facility] on 03/23/2017.   Pt presents alert in scrubs with logical/cohernet speech. Pt's eye contact is good. Pt's mood is irritable/helpliess. Pt's affect was flat. Pt's thought process is coherent/relevant. Pt's judgement is unimpaired. Pt's concentration is normal. Pt's insight was poor. Pt's impulse control was fair. Pt was oriented x4 (day, year, city and state.) Pt reported, if discharged from [facility] he could not contract for safety.   Diagnosis: Major Depressive Disorder, Recurrent, Severe, without psychotic features  Past Medical History:  Past Medical History:  Diagnosis Date  . Diabetes mellitus without complication (HCC)   . Hypertension     Past Surgical History:  Procedure Laterality Date  . TONSILLECTOMY      Family History: History reviewed. No pertinent family history.  Social History:  reports  that he has never smoked. He has never used smokeless tobacco. He reports that he does not drink alcohol or use drugs.  Additional Social History:  Alcohol / Drug Use Pain Medications: See MAR Prescriptions: See MAR Over the Counter: See MAR History of alcohol / drug use?:  (UDS is pending. )  CIWA: CIWA-Ar BP: (!) 111/59 Pulse Rate: 63 COWS:    Allergies: No Known Allergies  Home Medications:  (Not in a hospital admission)  OB/GYN Status:  No LMP for male patient.  General Assessment Data [facility]: [facility] TTS Assessment: In system Is this a Tele or Face-to-Face Assessment?: Face-to-Face Is this an Initial Assessment or a Re-assessment for this encounter?: Initial Assessment Marital status: Divorced Living Arrangements: Other (Comment) (Homeless) Can pt return to current living arrangement?: Yes Admission Status: Involuntary Referral Source: Other Museum/gallery curator) Insurance type: Press photographer.     Crisis Care Plan Living Arrangements: Other (Comment) (Homeless) Legal Guardian: Other: (Self) Name of Psychiatrist: NA Name of Therapist: NA  Education Status Is patient currently in school?: No Current Grade: NA Highest grade of school patient has completed: Pt reported, three years of college. Name of school: NA Contact person: NA  Risk to self with the past 6 months Suicidal Ideation: Yes-Currently Present Has patient been a risk to self within the past 6 months prior to admission? : Yes Suicidal Intent: Yes-Currently Present Has patient had any suicidal intent within the past 6 months prior to admission? : No Is patient at risk for suicide?: Yes Suicidal Plan?: Yes-Currently Present Has patient had any suicidal plan within the  past 6 months prior to admission? : No Specify Current Suicidal Plan: Pt repore, running onto the interstate.  Access to Means: Yes Specify Access to Suicidal Means: Pt is able to locate an interstate.  What has been  your use of drugs/alcohol within the last 12 months?: UDS is pending. Previous Attempts/Gestures: Yes How many times?: 1 Other Self Harm Risks: Pt denies.  Triggers for Past Attempts: Unknown Intentional Self Injurious Behavior: None (Pt denies. ) Family Suicide History: Unable to assess Recent stressful life event(s): Other (Comment) (Homelessness, no family supports. ) Persecutory voices/beliefs?: No Depression: Yes Depression Symptoms: Feeling angry/irritable, Despondent, Feeling worthless/self pity Substance abuse history and/or treatment for substance abuse?: No Suicide prevention information given to non-admitted patients: Not applicable  Risk to Others within the past 6 months Homicidal Ideation: No (Pt denies. ) Does patient have any lifetime risk of violence toward others beyond the six months prior to admission? : No Thoughts of Harm to Others: No Current Homicidal Intent: No Current Homicidal Plan: No Access to Homicidal Means: No Identified Victim: NA History of harm to others?: No Assessment of Violence: None Noted Violent Behavior Description: NA Criminal Charges Pending?: No Does patient have a court date: No Is patient on probation?: No  Psychosis Hallucinations: None noted Delusions: None noted  Mental Status Report Appearance/Hygiene: Unremarkable Eye Contact: Good Motor Activity: Unremarkable Speech: Logical/coherent Level of Consciousness: Alert Mood: Irritable, Helpless Affect: Flat Anxiety Level: Moderate Thought Processes: Coherent, Relevant Judgement: Unimpaired Orientation: Other (Comment) (day, year, city and state.) Obsessive Compulsive Thoughts/Behaviors: None  Cognitive Functioning Concentration: Normal Memory: Recent Intact IQ: Average Insight: Poor Impulse Control: Fair Appetite: Fair Sleep: Unable to Assess Vegetative Symptoms: None  ADLScreening [facility] Assessment Services) Patient's cognitive ability adequate to safely complete  daily activities?: Yes Patient able to express need for assistance with ADLs?: Yes Independently performs ADLs?: Yes (appropriate for developmental age)  Prior Inpatient Therapy Prior Inpatient Therapy: Yes (Per chart. ) Prior Therapy Dates: Per chart, 2018. Prior Therapy Facilty/Provider(s): Per chart, [facility], [facility]. Reason for Treatment: Per chart, MH issues.   Prior Outpatient Therapy Prior Outpatient Therapy: No Prior Therapy Dates: NA Prior Therapy Facilty/Provider(s): NA Reason for Treatment: NA Does patient have an ACCT team?: No Does patient have Intensive In-House Services?  : No Does patient have [facility] services? : No Does patient have P4CC services?: No  ADL Screening (condition at time of admission) Patient's cognitive ability adequate to safely complete daily activities?: Yes Is the patient deaf or have difficulty hearing?: No Does the patient have difficulty seeing, even when wearing glasses/contacts?: Yes (Pt reported, he used to wear glasses now he wears readers. ) Does the patient have difficulty concentrating, remembering, or making decisions?: No Patient able to express need for assistance with ADLs?: Yes Does the patient have difficulty dressing or bathing?: No Independently performs ADLs?: Yes (appropriate for developmental age) Communication: Independent Does the patient have difficulty walking or climbing stairs?: No Weakness of Legs: None Weakness of Arms/Hands: None       Abuse/Neglect Assessment (Assessment to be complete while patient is alone) Physical Abuse: Denies (Pt denies. ) Verbal Abuse: Denies (Pt denies. ) Sexual Abuse: Denies (Pt denies. ) Exploitation of patient/patient's resources: Denies (Pt denies. ) Self-Neglect: Denies (Pt denies. )     Advance Directives (For Healthcare) Does Patient Have a Medical Advance Directive?: No    Additional Information 1:1 In Past 12 Months?: No CIRT Risk: No Elopement Risk: No Does patient have  medical clearance?: Yes  Disposition: Nira ConnJason Berry, NP recommends overnight observation and re-evaluation in the morning. Disposition discussed with France RavensMercedes, PA and Lowella BandyNikki, Charity fundraiserN.    Disposition Initial Assessment Completed for this Encounter: Yes Disposition of Patient:  (AM Psychiatic Evaluation.) Other disposition(s): Other (Comment) (AM Psychiatic Evaluation.)  On Site Evaluation by:   Reviewed with Physician:  France RavensMercedes, PA and Nira ConnJason, Berry, NP.   Redmond Pullingreylese D Bracen Schum 04/12/2017 9:10 PM   Redmond Pullingreylese D Kadian Barcellos, MS, [facility], [facility] Triage Specialist [phone]

## 2017-04-12 NOTE — Clinical Social Work Note (Signed)
Clinical Social Work Assessment  Patient Details  Name: Bryan Farrell MRN: [id number] Date of Birth: 1950-08-23  Date of referral:  04/12/17               Reason for consult:  Housing Concerns/Homelessness                Permission sought to share information with:  Chartered certified accountant granted to share information::  No  Name::        Agency::     Relationship::     Contact Information:     Housing/Transportation Living arrangements for the past 2 months:  Homeless Source of Information:  Patient Patient Interpreter Needed:  None Criminal Activity/Legal Involvement Pertinent to Current Situation/Hospitalization:    Significant Relationships:  Adult Children (Pt has a son and daughter and an ex-wife) Lives with:  Self Do you feel safe going back to the place where you live?  No Need for family participation in patient care:  No (Coment)  Care giving concerns:  Pt states he can't take care of himself and desires for his son or daughter to let him live with them.  Per notes, pt's son and daughter have refused to talk to the patient saying the pt was given a $400,000 inheritance and spent it all while living in the [place] in [place].  Per pt's son (per notes) pt "has made some bad life decisions".    Pt repeatedly insists, per notes and to this CSW during previous visits that his son and daughter must allow him to move in with them, even though he has been repeatedly told that his children have refused to assist him.  Pt's ex-wife has stated to this CSW on a previous visit she is willing to assist the pt in applying for social security, but pt refused saying he is 42 and in two years he will "get twice as much", per the pt.   Social Worker assessment / plan:  CSW met with pt and confirmed pt's has no plan at D/C and when asked stated, "I just don't know".  CSW provided active listening and validated pt's concerns his children "should help me".  Pt has  been homeless prior to being admitted to [facility].   Employment status:  Unemployed Forensic scientist:  Self Pay (Medicaid Pending) PT Recommendations:  Not assessed at this time Information / Referral to community resources:     Patient/Family's Response to care:  Patient alert and oriented.  Patient agreeable only to his declaration that he has no plan to plan.  Pt's family not supportive and strongly involved in pt.'s care.  Pt.'s was irritable during CSW intervention.    Patient/Family's Understanding of and Emotional Response to Diagnosis, Current Treatment, and Prognosis:  Still assessing  Emotional Assessment Appearance:  Appears stated age Attitude/Demeanor/Rapport:    Affect (typically observed):  Hopeless, Withdrawn, Irritable, Flat Orientation:  Oriented to Self, Oriented to Place, Oriented to  Time, Oriented to Situation Alcohol / Substance use:    Psych involvement (Current and /or in the community):     Discharge Needs  Concerns to be addressed:  Homelessness Readmission within the last 30 days:  Yes Current discharge risk:  Cognitively Impaired (Pt has dementia diagnosis) Barriers to Discharge:  No Barriers Identified   Claudine Mouton, LCSWA 04/12/2017, 6:24 PM

## 2017-04-12 NOTE — ED Notes (Signed)
Bed: [id number] Expected date:  Expected time:  Means of arrival:  Comments[facility]

## 2017-04-13 ENCOUNTER — Telehealth: Payer: Self-pay | Admitting: Emergency Medicine

## 2017-04-13 DIAGNOSIS — F4321 Adjustment disorder with depressed mood: Secondary | ICD-10-CM | POA: Diagnosis not present

## 2017-04-13 NOTE — Discharge Instructions (Signed)
For your ongoing mental health needs, you are advised to follow up with [facility].  New and returning patients are seen at their walk-in clinic.  Walk-in hours are Monday - Friday from 8:00 am - 3:00 pm.  Walk-in patients are seen on a first come, first served basis.  Try to arrive as early as possible for he best chance of being seen the same day: ° °     [facility] °     [address] °     [place], [place] [address] °     [phone] °

## 2017-04-13 NOTE — Consult Note (Signed)
[facility] Consult   Reason for Consult:  Depression with suicidal ideations Referring Physician:  EDP Patient Identification: Bryan Farrell MRN:  [id number] Principal Diagnosis: Adjustment disorder with depressed mood Diagnosis:   Patient Active Problem List   Diagnosis Date Noted  . Adjustment disorder with depressed mood [F43.21] 04/13/2017    Priority: High  . Vitamin B12 deficiency [E53.8] 03/25/2017  . Vascular dementia [F01.50] 03/23/2017  . Suicidal ideation [R45.851] 03/23/2017  . Homelessness [Z59.0] 03/23/2017  . Diabetes ([address]) [E11.9] 03/23/2017    Total Time spent with patient: 45 minutes  Subjective:   Bryan Farrell is a 66 y.o. male patient does not warrant admission.  HPI:  66 yo male who presented to the ED with depression and suicidal ideations.  Recent hospitalization at [facility] but did not go to his follow-up He is upset that his adult children who live in [facility] do not allow him to stay with them.  His exwife in [place] has agreed to help him but he refuses.  According to the care manager, he refuses to take early SS because he wants to wait until he is 75 to get full benefits.  Today, he is depressed but no suicidal/homicidal ideations, hallucinations, or alcohol/drug abuse.  [facility] called by Care Management and they visited him, plan to follow him in the community.    Past Psychiatric History: depression  Risk to Self: Suicidal Ideation: Not Currently  Suicidal Intent: Not Currently  Is patient at risk for suicide?: Denies Suicidal Plan?: Not Currently  Specify Current Suicidal Plan: Pt denies.  Access to Means: Patient denies Specify Access to Suicidal Means: Pt is able to locate an interstate.  What has been your use of drugs/alcohol within the last 12 months?: UDS is pending. How many times?: 1 Other Self Harm Risks: Pt denies.  Triggers for Past Attempts: Unknown Intentional Self Injurious Behavior: None (Pt  denies. ) Risk to Others: Homicidal Ideation: No (Pt denies. ) Thoughts of Harm to Others: No Current Homicidal Intent: No Current Homicidal Plan: No Access to Homicidal Means: No Identified Victim: NA History of harm to others?: No Assessment of Violence: None Noted Violent Behavior Description: NA Criminal Charges Pending?: No Does patient have a court date: No Prior Inpatient Therapy: Prior Inpatient Therapy: Yes (Per chart. ) Prior Therapy Dates: Per chart, 2018. Prior Therapy Facilty/Provider(s): [facility] chart, [facility], [facility]. Reason for Treatment: Per chart, MH issues.  Prior Outpatient Therapy: Prior Outpatient Therapy: No Prior Therapy Dates: NA Prior Therapy Facilty/Provider(s): NA Reason for Treatment: NA Does patient have an ACCT team?: No Does patient have Intensive In-House Services?  : No Does patient have [facility] services? : No Does patient have P4CC services?: No  Past Medical History:  Past Medical History:  Diagnosis Date  . Diabetes mellitus without complication ([place])   . Hypertension     Past Surgical History:  Procedure Laterality Date  . TONSILLECTOMY     Family History: History reviewed. No pertinent family history. Family Psychiatric  History: none Social History:  History  Alcohol Use No    Comment: BAC clear     History  Drug Use No    Comment: UDS clear    Social History   Social History  . Marital status: Single    Spouse name: N/A  . Number of children: N/A  . Years of education: N/A   Social History Main Topics  . Smoking status: Never Smoker  . Smokeless tobacco: Never Used  .  Alcohol use No     Comment: BAC clear  . Drug use: No     Comment: UDS clear  . Sexual activity: No   Other Topics Concern  . None   Social History Narrative  . None   Additional Social History:    Allergies:  No Known Allergies  Labs:  Results for orders placed or performed during the hospital encounter of 04/12/17 (from the past 48 hour(s))   Rapid urine drug screen (hospital performed)     Status: None   Collection Time: 04/12/17  4:52 PM  Result Value Ref Range   Opiates NONE DETECTED NONE DETECTED   Cocaine NONE DETECTED NONE DETECTED   Benzodiazepines NONE DETECTED NONE DETECTED   Amphetamines NONE DETECTED NONE DETECTED   Tetrahydrocannabinol NONE DETECTED NONE DETECTED   Barbiturates NONE DETECTED NONE DETECTED    Comment:        DRUG SCREEN FOR MEDICAL PURPOSES ONLY.  IF CONFIRMATION IS NEEDED FOR ANY PURPOSE, NOTIFY LAB WITHIN 5 DAYS.        LOWEST DETECTABLE LIMITS FOR URINE DRUG SCREEN Drug Class       Cutoff (ng/mL) Amphetamine      1000 Barbiturate      200 Benzodiazepine   287 Tricyclics       681 Opiates          300 Cocaine          300 THC              50   Comprehensive metabolic panel     Status: Abnormal   Collection Time: 04/12/17  5:37 PM  Result Value Ref Range   Sodium 136 135 - 145 mmol/L   Potassium 3.5 3.5 - 5.1 mmol/L   Chloride 103 101 - 111 mmol/L   CO2 25 22 - 32 mmol/L   Glucose, Bld 185 (H) 65 - 99 mg/dL   BUN 13 6 - 20 mg/dL   Creatinine, Ser 0.81 0.61 - 1.24 mg/dL   Calcium 9.1 8.9 - 10.3 mg/dL   Total Protein 6.8 6.5 - 8.1 g/dL   Albumin 4.0 3.5 - 5.0 g/dL   AST 13 (L) 15 - 41 U/L   ALT 15 (L) 17 - 63 U/L   Alkaline Phosphatase 100 38 - 126 U/L   Total Bilirubin 1.1 0.3 - 1.2 mg/dL   GFR calc non Af Amer >60 >60 mL/min   GFR calc Af Amer >60 >60 mL/min    Comment: (NOTE) The eGFR has been calculated using the CKD EPI equation. This calculation has not been validated in all clinical situations. eGFR's persistently <60 mL/min signify possible Chronic Kidney Disease.    Anion gap 8 5 - 15  Ethanol     Status: None   Collection Time: 04/12/17  5:37 PM  Result Value Ref Range   Alcohol, Ethyl (B) <5 <5 mg/dL    Comment:        LOWEST DETECTABLE LIMIT FOR SERUM ALCOHOL IS 5 mg/dL FOR MEDICAL PURPOSES ONLY   Salicylate level     Status: None   Collection Time:  04/12/17  5:37 PM  Result Value Ref Range   Salicylate Lvl <1.5 2.8 - 30.0 mg/dL  Acetaminophen level     Status: Abnormal   Collection Time: 04/12/17  5:37 PM  Result Value Ref Range   Acetaminophen (Tylenol), Serum <10 (L) 10 - 30 ug/mL    Comment:        THERAPEUTIC CONCENTRATIONS  VARY SIGNIFICANTLY. A RANGE OF 10-30 ug/mL MAY BE AN EFFECTIVE CONCENTRATION FOR MANY PATIENTS. HOWEVER, SOME ARE BEST TREATED AT CONCENTRATIONS OUTSIDE THIS RANGE. ACETAMINOPHEN CONCENTRATIONS >150 ug/mL AT 4 HOURS AFTER INGESTION AND >50 ug/mL AT 12 HOURS AFTER INGESTION ARE OFTEN ASSOCIATED WITH TOXIC REACTIONS.   cbc     Status: Abnormal   Collection Time: 04/12/17  5:37 PM  Result Value Ref Range   WBC 6.4 4.0 - 10.5 K/uL   RBC 4.56 4.22 - 5.81 MIL/uL   Hemoglobin 13.7 13.0 - 17.0 g/dL   HCT 38.9 (L) 39.0 - 52.0 %   MCV 85.3 78.0 - 100.0 fL   MCH 30.0 26.0 - 34.0 pg   MCHC 35.2 30.0 - 36.0 g/dL   RDW 13.3 11.5 - 15.5 %   Platelets 287 150 - 400 K/uL    Current Facility-Administered Medications  Medication Dose Route Frequency Provider Last Rate Last Dose  . acetaminophen (TYLENOL) tablet 650 mg  650 mg Oral Q4H PRN Street, [place], [place]      . alum & mag hydroxide-simeth (MAALOX/MYLANTA) 200-200-20 MG/5ML suspension 30 mL  30 mL Oral Q6H PRN Street, [place], [place]      . metFORMIN (GLUCOPHAGE) tablet 500 mg  500 mg Oral BID WC [address], [place], [place]   500 mg at 04/13/17 0944  . ondansetron (ZOFRAN) tablet 4 mg  4 mg Oral Q8H PRN Street, [place], [place]       No current outpatient prescriptions on file.    Musculoskeletal: Strength & Muscle Tone: within normal limits Gait & Station: normal Patient leans: N/A  Psychiatric Specialty Exam: Physical Exam  Constitutional: He is oriented to person, place, and time. He appears well-developed and well-nourished.  HENT:  Head: Normocephalic.  Neck: Normal range of motion.  Respiratory: Effort normal.  Musculoskeletal: Normal range of  motion.  Neurological: He is alert and oriented to person, place, and time.  Psychiatric: His speech is normal and behavior is normal. Judgment and thought content normal. Cognition and memory are normal. He exhibits a depressed mood.    Review of Systems  Psychiatric/Behavioral: Positive for depression.  All other systems reviewed and are negative.   Blood pressure (!) 133/91, pulse 67, temperature 97.9 F (36.6 C), temperature source Oral, resp. rate 16, height 6' (1.829 m), weight 106.6 kg (235 lb), SpO2 97 %.Body mass index is 31.87 kg/m.  General Appearance: Casual  Eye Contact:  Good  Speech:  Normal Rate  Volume:  Normal  Mood:  Depressed  Affect:  Congruent  Thought Process:  Coherent and Descriptions of Associations: Intact  Orientation:  Full (Time, Place, and Person)  Thought Content:  WDL and Logical  Suicidal Thoughts:  No  Homicidal Thoughts:  No  Memory:  Immediate;   Good Recent;   Good Remote;   Good  Judgement:  Fair  Insight:  Fair  Psychomotor Activity:  Normal  Concentration:  Concentration: Good and Attention Span: Good  Recall:  Good  Fund of Knowledge:  Fair  Language:  Good  Akathisia:  No  Handed:  Right  AIMS (if indicated):     Assets:  Leisure Time Physical Health Resilience  ADL's:  Intact  Cognition:  WNL  Sleep:        Treatment Plan Summary: Daily contact with patient to assess and evaluate symptoms and progress in treatment, Medication management and Plan adjustment disorder with depressed mood:  -Crisis stabilization -Medication management:  None started, encouraged to take his discharge medications -Individual  counseling -care management and [facility] involved  Disposition: No evidence of imminent risk to self or others at present.    Waylan Boga, NP 04/13/2017 11:11 AM  Patient seen face-to-face for psychiatric evaluation, chart reviewed and case discussed with the physician extender and developed treatment plan. Reviewed the  information documented and agree with the treatment plan. Corena Pilgrim, MD

## 2017-04-13 NOTE — BHH Suicide Risk Assessment (Signed)
Suicide Risk Assessment  Discharge Assessment   [facility]   Principal Problem: Adjustment disorder with depressed mood Discharge Diagnoses:  Patient Active Problem List   Diagnosis Date Noted  . Adjustment disorder with depressed mood [F43.21] 04/13/2017    Priority: High  . Vitamin B12 deficiency [E53.8] 03/25/2017  . Vascular dementia [F01.50] 03/23/2017  . Suicidal ideation [R45.851] 03/23/2017  . Homelessness [Z59.0] 03/23/2017  . Diabetes (HCC) [E11.9] 03/23/2017    Total Time spent with patient: 45 minutes  Musculoskeletal: Strength & Muscle Tone: within normal limits Gait & Station: normal Patient leans: N/A  Psychiatric Specialty Exam: Physical Exam  Constitutional: He is oriented to person, place, and time. He appears well-developed and well-nourished.  HENT:  Head: Normocephalic.  Neck: Normal range of motion.  Respiratory: Effort normal.  Musculoskeletal: Normal range of motion.  Neurological: He is alert and oriented to person, place, and time.  Psychiatric: His speech is normal and behavior is normal. Judgment and thought content normal. Cognition and memory are normal. He exhibits a depressed mood.    Review of Systems  Psychiatric/Behavioral: Positive for depression.  All other systems reviewed and are negative.   Blood pressure (!) 133/91, pulse 67, temperature 97.9 F (36.6 C), temperature source Oral, resp. rate 16, height 6' (1.829 m), weight 106.6 kg (235 lb), SpO2 97 %.Body mass index is 31.87 kg/m.  General Appearance: Casual  Eye Contact:  Good  Speech:  Normal Rate  Volume:  Normal  Mood:  Depressed  Affect:  Congruent  Thought Process:  Coherent and Descriptions of Associations: Intact  Orientation:  Full (Time, Place, and Person)  Thought Content:  WDL and Logical  Suicidal Thoughts:  No  Homicidal Thoughts:  No  Memory:  Immediate;   Good Recent;   Good Remote;   Good  Judgement:  Fair  Insight:  Fair   Psychomotor Activity:  Normal  Concentration:  Concentration: Good and Attention Span: Good  Recall:  Good  Fund of Knowledge:  Fair  Language:  Good  Akathisia:  No  Handed:  Right  AIMS (if indicated):     Assets:  Leisure Time Physical Health Resilience  ADL's:  Intact  Cognition:  WNL  Sleep:      Mental Status Per Nursing Assessment::   On Admission:   depressed  Demographic Factors:  Male and Caucasian  Loss Factors: NA  Historical Factors: NA  Risk Reduction Factors:   Sense of responsibility to family and Positive therapeutic relationship  Continued Clinical Symptoms:  Depression, mild  Cognitive Features That Contribute To Risk:  None    Suicide Risk:  Minimal: No identifiable suicidal ideation.  Patients presenting with no risk factors but with morbid ruminations; may be classified as minimal risk based on the severity of the depressive symptoms  Follow-up Information    Placey, Chales AbrahamsMary Ann, NP. Schedule an appointment as soon as possible for a visit.   Contact information: [address] [place] [address] [phone]           Plan Of Care/Follow-up recommendations:  Activity:  as toleraetd Diet:  heart healthy diet  LORD, JAMISON, NP 04/13/2017, 11:15 AM

## 2017-04-13 NOTE — ED Provider Notes (Signed)
Vitals:   04/12/17 2334 04/13/17 0553  BP: 109/71 (!) 133/91  Pulse: 66 67  Resp: 16 16  Temp: 98.3 F (36.8 C) 97.9 F (36.6 C)  SpO2: 95% 97%   Medically stable this am.  Awaiting psych dispo   Linwood DibblesKnapp, Lina Hitch, MD 04/13/17 [phone]

## 2017-04-13 NOTE — BH Assessment (Signed)
BHH Assessment Progress Note  Per Thedore MinsMojeed Akintayo, MD, this pt does not require psychiatric hospitalization at this time.  Pt presents under IVC initiated by Azucena FreedIkenna Obasi, [facility], which Dr Jannifer FranklinAkintayo has rescinded.  Pt is to [facility] with [facility].  This has been included in pt's discharge instructions.  Pt's nurse, Aram BeechamCynthia, has been notified.  Doylene Canninghomas Jas Betten, MA Triage Specialist [phone]

## 2017-04-13 NOTE — ED Notes (Signed)
Patient given his paperwork and his personal belongings.  Instructions to [facility] and [facility] given.  Patient replied, "I've tried them, they never help.

## 2017-04-13 NOTE — Telephone Encounter (Signed)
CM left VM for Terrence to return call in attempts to help pt in the community.

## 2017-04-13 NOTE — ED Notes (Signed)
Offered patient a bus pass.  Patient yelled, "I don't have anywhere to go."  Nurse  requested an answer.  Patient yelled, "no".

## 2017-04-13 NOTE — Care Management Note (Addendum)
Case Management Note  CM went to speak with pt to find out [place] in to be able to [facility] where they could located him on D/C.  The PATH team with the [facility], Felipa Etherrence and Ladene ArtistDerrick, were already in the room visiting the pt.  Felipa Etherrence advised that [facility] past Monday but was not enrolled in their programs yet.  Advised that since they had not seen the pt in the last couple of days, Lavinia SharpsMary Ann Placey, NP checked in Epic to see if he had returned to the ED.  When they saw that he had, they came to visit and talk with him.  Onalee Huaavid, [facility] Triage specialist and CM spoke at length with the PATH team about helping pt, including initially with financial resources, such as Medicare and Medicaid.  Pt additionally told the PATH team that he was a registered sex offender which made it impossible for him to stay at shelters. CM checked both the state and national registry for pt and could not locate him as an offender.  Placed [facility] follow up information on AVS for time of D/C.  CM will continue to follow pt on subsequent visits.

## 2017-04-14 ENCOUNTER — Encounter (HOSPITAL_COMMUNITY): Payer: Self-pay | Admitting: *Deleted

## 2017-04-14 ENCOUNTER — Emergency Department (HOSPITAL_COMMUNITY)
Admission: EM | Admit: 2017-04-14 | Discharge: 2017-04-14 | Disposition: A | Discharge status: Home / Self Care | Payer: No Typology Code available for payment source | Attending: Emergency Medicine | Admitting: Emergency Medicine

## 2017-04-14 DIAGNOSIS — E119 Type 2 diabetes mellitus without complications: Secondary | ICD-10-CM | POA: Insufficient documentation

## 2017-04-14 DIAGNOSIS — T679XXA Effect of heat and light, unspecified, initial encounter: Secondary | ICD-10-CM | POA: Insufficient documentation

## 2017-04-14 DIAGNOSIS — I10 Essential (primary) hypertension: Secondary | ICD-10-CM | POA: Insufficient documentation

## 2017-04-14 NOTE — Discharge Instructions (Signed)
Continue drinking lots of water to stay hydrated. Follow with [facility] as needed. Return to the ER for new or concerning symptoms.

## 2017-04-14 NOTE — ED Triage Notes (Signed)
Per EMS, pt complains of heat exhaustion and states he has not been eating enough. Pt is homeless. Pt denies pain.

## 2017-04-14 NOTE — ED Provider Notes (Signed)
[facility]-EMERGENCY DEPT Provider Note   CSN: [id number] Arrival date & time: 04/14/17  1633     History   Chief Complaint Chief Complaint  Patient presents with  . Heat Exposure    HPI Bryan Farrell is a 66 y.o. male past medical history of type 2 diabetes, adjustment disorder, depression, dementia, homelessness, presents to the ED with complaint of "getting over-heated." Patient states he was walking down the street earlier today when he began feeling lightheaded. At that time he sat down in the grass and eventually a passerby saw him and called EMS which brought him here. He denies syncope, headache, vision changes, chest pain, shortness of breath, numbness or weakness, or any other symptoms today. Patient reports he is drinking full bottle of water while in the ED, with some improvement in symptoms. He states he is currently not feeling lightheaded.  Per chart review, patient with 12 ED visits in August 2018. Most recent visit was 04/12/2017, for SI with psychiatric evaluation. Patient deemed safe for discharge yesterday, 04/13/2017. During that visit, labs were unremarkable, including CBC and CMP.  CT head done on 03/23/17 with old infarcts and atrophy.    The history is provided by the patient.    Past Medical History:  Diagnosis Date  . Diabetes mellitus without complication (HCC)   . Hypertension     Patient Active Problem List   Diagnosis Date Noted  . Adjustment disorder with depressed mood 04/13/2017  . Vitamin B12 deficiency 03/25/2017  . Vascular dementia 03/23/2017  . Suicidal ideation 03/23/2017  . Homelessness 03/23/2017  . Diabetes (HCC) 03/23/2017    Past Surgical History:  Procedure Laterality Date  . TONSILLECTOMY         Home Medications    Prior to Admission medications   Not on File    Family History No family history on file.  Social History Social History  Substance Use Topics  . Smoking status: Never Smoker  . Smokeless tobacco:  Never Used  . Alcohol use No     Comment: BAC clear     Allergies   Patient has no known allergies.   Review of Systems Review of Systems  Constitutional: Positive for fatigue.  HENT: Negative.   Eyes: Negative for photophobia and visual disturbance.  Respiratory: Negative for shortness of breath.   Cardiovascular: Negative for chest pain.  Gastrointestinal: Negative for blood in stool, nausea and vomiting.  Genitourinary: Negative.   Musculoskeletal: Negative.   Skin: Negative.   Neurological: Positive for light-headedness. Negative for syncope, facial asymmetry, weakness, numbness and headaches.     Physical Exam Updated Vital Signs BP (!) 134/93   Pulse 87   Temp 98.6 F (37 C) (Oral)   Resp 16   SpO2 96%   Physical Exam  Constitutional: He is oriented to person, place, and time. He appears well-developed and well-nourished.  Poor hygiene. Not in distress  HENT:  Head: Normocephalic and atraumatic.  Eyes: Conjunctivae and EOM are normal. Right eye exhibits no discharge. Left eye exhibits no discharge.  Right pupil is slightly larger than left, bilateral pupils are reactive to light and are consensual. Both pupils are round  Neck: Normal range of motion. Neck supple.  Cardiovascular: Normal rate, regular rhythm, normal heart sounds and intact distal pulses.  Exam reveals no gallop and no friction rub.   No murmur heard. Pulmonary/Chest: Effort normal and breath sounds normal. No respiratory distress. He has no wheezes. He has no rales.  Abdominal: Soft. Bowel  sounds are normal. He exhibits no distension and no mass. There is no tenderness. There is no rebound and no guarding. No hernia.  Neurological: He is alert and oriented to person, place, and time. He displays normal reflexes. No cranial nerve deficit or sensory deficit. He exhibits normal muscle tone. Coordination normal.  Oriented to person, place and time. 5/5 strength bilateral upper and lower extremities.  Normal gait. Normal finger-nose and heel shin. Normal sensation.  Pt without lightheadedness or dizziness upon standing  Skin: Skin is warm.  Psychiatric: He has a normal mood and affect. His behavior is normal.  Nursing note and vitals reviewed.    ED Treatments / Results  Labs (all labs ordered are listed, but only abnormal results are displayed) Labs Reviewed - No data to display  EKG  EKG Interpretation None       Radiology No results found.  Procedures Procedures (including critical care time)  Medications Ordered in ED Medications - No data to display   Initial Impression / Assessment and Plan / ED Course  I have reviewed the triage vital signs and the nursing notes.  Pertinent labs & imaging results that were available during my care of the patient were reviewed by me and considered in my medical decision making (see chart for details).     Patient presents to the ED with complaints of "getting over-heated." On exam, pt with slightly uneven pupils that are reactive to light and consensual. Remainder of neuro exam is normal without deficits. CT head done 03/23/17 without acute changes. Doubt eye exam is related to pt complaints. No other findings today to warrant further imaging. Pt ambulating without symptoms. Heart and lung exam benign. VSS. Labs done 2 days ago were unremarkable. Pt given PO hydration in ED with improvement in symptoms. Pt with 12 ED visits since 03/17/17. Most recent visit, pt set up with resources and is being seen by [facility]. Pt is safe for discharge.  Patient discussed with Dr. Rubin Payor who guided treatment and agrees with care plan for discharge.  Discussed results, findings, treatment and follow up. Patient advised of return precautions. Patient verbalized understanding and agreed with plan.  Final Clinical Impressions(s) / ED Diagnoses   Final diagnoses:  Heat exposure, initial encounter    New Prescriptions New Prescriptions   No  medications on file     Russo, [place] N, PA-C 04/14/17 1914    Benjiman Core, MD 04/15/17 [phone]

## 2017-04-17 ENCOUNTER — Telehealth: Payer: Self-pay | Admitting: Emergency Medicine

## 2017-04-17 NOTE — Telephone Encounter (Signed)
Derrick Ward, CM with [facility] at the [facility] called to get an update on pt.  Stated they had not seen him since they visited him in the ED last and were going to do rounds to check on people in the community.  He was hoping for an idea of locations to find the pt.  Advised that pt was last here on 9/1 and he's been [place] a lot [place] [facility] and then occasionally is a walk in to the ED.

## 2017-05-02 ENCOUNTER — Emergency Department (HOSPITAL_COMMUNITY): Payer: Self-pay

## 2017-05-02 ENCOUNTER — Emergency Department (HOSPITAL_COMMUNITY)
Admission: EM | Admit: 2017-05-02 | Discharge: 2017-05-02 | Disposition: A | Discharge status: Home / Self Care | Payer: Self-pay | Attending: Emergency Medicine | Admitting: Emergency Medicine

## 2017-05-02 DIAGNOSIS — I10 Essential (primary) hypertension: Secondary | ICD-10-CM | POA: Insufficient documentation

## 2017-05-02 DIAGNOSIS — Y999 Unspecified external cause status: Secondary | ICD-10-CM | POA: Insufficient documentation

## 2017-05-02 DIAGNOSIS — S82045A Nondisplaced comminuted fracture of left patella, initial encounter for closed fracture: Secondary | ICD-10-CM | POA: Insufficient documentation

## 2017-05-02 DIAGNOSIS — Y92480 Sidewalk as the place of occurrence of the external cause: Secondary | ICD-10-CM | POA: Insufficient documentation

## 2017-05-02 DIAGNOSIS — Y9301 Activity, walking, marching and hiking: Secondary | ICD-10-CM | POA: Insufficient documentation

## 2017-05-02 DIAGNOSIS — W01198A Fall on same level from slipping, tripping and stumbling with subsequent striking against other object, initial encounter: Secondary | ICD-10-CM | POA: Insufficient documentation

## 2017-05-02 DIAGNOSIS — E119 Type 2 diabetes mellitus without complications: Secondary | ICD-10-CM | POA: Insufficient documentation

## 2017-05-02 MED ORDER — NAPROXEN 500 MG PO TABS: 500.0000 mg | ORAL_TABLET | Freq: Two times a day (BID) | ORAL | 0 refills | Status: DC

## 2017-05-02 MED ORDER — ACETAMINOPHEN 325 MG PO TABS
650.0000 mg | ORAL_TABLET | Freq: Once | ORAL | Status: AC
  Administered 2017-05-02: 650 mg via ORAL
  Filled 2017-05-02: qty 2

## 2017-05-02 NOTE — Care Management Note (Addendum)
Case Management Note  CM additionally consulted for pt due to patellar fracture. CM reached out to Lavinia Sharps, NP at the [facility] who stated she would be happy to follow up with him tomorrow.  She said the building may look like they are closed due to renovations but that someone would let him him in and she would still see him.  She advised she would explain what the [facility] is and that would be the only way for him to follow up with a surgeon at this time.  Additionally reached out to [facility] with PATH at the [facility] to inform him of the plan with Chales Abrahams.  He advised they didn't know the pt was out of jail.  They had not been able to follow up with him since our last conversation due to him being in jail for trespassing.  He states they will be on the look out for him and encourage him to follow up with Chales Abrahams.  [facility] were additionally mentioned as a possible charity case, but the consensus was he would not follow through with coming to the [facility] at any scheduled time for the services.    Derrick with PATH called to say he was going to come see the pt before D/C.  CM spoke with pt and encouraged him to consider filing for medicare now instead of waiting until he was 59 like he has previously voiced.  Additionally encouraged him to go to the [facility] and see Chales Abrahams; that she could help him see the orthopedic specialist through the [facility].  Also let the pt know that Ladene Artist was coming from the [facility] and that he could help the pt file for medicare if he changed his mind.  Derrick advised that he will take pt directly to the [facility] to see Chales Abrahams on D/C today and attempt to get him a bed at a shelter for tonight.  Contacted Clydie Braun with [facility] for a walker for pt.  She stated she would bring one down.  Nilsa Nutting, PA. No further CM needs noted at this time.

## 2017-05-02 NOTE — ED Triage Notes (Signed)
Per EMS, [place] after [place] on [place] sidewalk. Pt reports falling on his side and denies hitting his head. Pt only complains of right knee pain 7/10.

## 2017-05-02 NOTE — Discharge Instructions (Signed)
Please read instructions below. Apply ice to your knee for 20 minutes at a time. Elevate it when possible. Keep the knee brace on at all times until seen by an orthopedic specialist. You can take naproxen every 12 hours as needed for pain. Schedule an appointment with the orthopedic specialist in 1 week for follow-up on your injury. Return to the ER for new or concerning symptoms.

## 2017-05-02 NOTE — Care Management Note (Signed)
Case Management Note  Patient Details  Name: Bryan Farrell MRN: [id number] Date of Birth: November 28, 1950  Expected Discharge Date:    05/02/17              Expected Discharge Plan:   (Homeless)  Discharge planning Services  CM Consult, Other - See comment  Post Acute Care Choice:  Durable Medical Equipment Choice offered to:  Patient  DME Arranged:  Dan Humphreys DME Agency:  [facility]  Status of Service:  Completed, signed off  Lauris Poag, RN 05/02/2017, 3:45 PM

## 2017-05-02 NOTE — Care Management Note (Signed)
Case Management Note  CM noted pt's return to the ED. Chronically homeless.  Placed information for Lavinia Sharps, NP on AVS for time of D/C.  Nilsa Nutting, PA.  No further CM needs noted at this time.

## 2017-05-02 NOTE — ED Provider Notes (Signed)
[facility]-EMERGENCY DEPT Provider Note   CSN: [id number] Arrival date & time: 05/02/17  1111     History   Chief Complaint Chief Complaint  Patient presents with  . Knee Pain (left)    HPI Bryan Farrell is a 66 y.o. male w PMHx Assessment disorder, hypertension, diabetes, homelessness, presenting to the ED with acute onset of left knee status post mechanical fall that occurred prior to arrival. Patient brought in by EMS, complaining of falling down town on the sidewalk. He localizes pain to the left anterior knee that is worse with movement and weightbearing. Denies history of previous knee injury, head trauma, LOC, neck or back pain, numbness or tingling, wounds. States pain is 5-6/10. Denies any other injuries or pain today.  The history is provided by the patient.    Past Medical History:  Diagnosis Date  . Diabetes mellitus without complication (HCC)   . Hypertension     Patient Active Problem List   Diagnosis Date Noted  . Adjustment disorder with depressed mood 04/13/2017  . Vitamin B12 deficiency 03/25/2017  . Vascular dementia 03/23/2017  . Suicidal ideation 03/23/2017  . Homelessness 03/23/2017  . Diabetes (HCC) 03/23/2017    Past Surgical History:  Procedure Laterality Date  . TONSILLECTOMY         Home Medications    Prior to Admission medications   Medication Sig Start Date End Date Taking? Authorizing Provider  naproxen (NAPROSYN) 500 MG tablet Take 1 tablet (500 mg total) by mouth 2 (two) times daily. 05/02/17   Russo, [place] N, PA-C    Family History No family history on file.  Social History Social History  Substance Use Topics  . Smoking status: Never Smoker  . Smokeless tobacco: Never Used  . Alcohol use No     Comment: BAC clear     Allergies   Patient has no known allergies.   Review of Systems Review of Systems  Musculoskeletal: Positive for arthralgias. Negative for back pain, joint swelling and neck pain.  Skin: Negative  for wound.  Neurological: Negative for syncope, numbness and headaches.     Physical Exam Updated Vital Signs BP (!) 140/92 (BP Location: Left Arm)   Pulse 84   Temp 99 F (37.2 C) (Oral)   Resp 18   SpO2 100%   Physical Exam  Constitutional: He appears well-developed and well-nourished. No distress.  Disheveled with poor hygiene.  HENT:  Head: Normocephalic and atraumatic.  Eyes: Conjunctivae are normal.  Neck: Normal range of motion.  Cardiovascular: Normal rate and intact distal pulses.   Pulmonary/Chest: Effort normal.  Musculoskeletal:  No spinal or paraspinal tenderness, no bony step-offs, no gross deformities. Left knee with tenderness over patella. no ecchymosis or wounds or edema. Knee w normal range of motion, negative anterior posterior drawer, neg valgus and varus.  No calf or thigh tenderness  Neurological:  Normal sensation  Skin:  No wounds  Psychiatric: He has a normal mood and affect. His behavior is normal.  Nursing note and vitals reviewed.    ED Treatments / Results  Labs (all labs ordered are listed, but only abnormal results are displayed) Labs Reviewed - No data to display  EKG  EKG Interpretation None       Radiology Ct Knee Left Wo Contrast  Result Date: 05/02/2017 CLINICAL DATA:  Fall. Patellar pain. Possible vertical fracture of the patella on conventional radiography. EXAM: CT OF THE Left KNEE WITHOUT CONTRAST TECHNIQUE: Multidetector CT imaging of the left knee  was performed according to the standard protocol. Multiplanar CT image reconstructions were also generated. COMPARISON:  05/02/2017 radiographs FINDINGS: Bones/Joint/Cartilage CT confirms an acute longitudinal fracture of the left patella extending vertically the and including the lateral patellar facet. 2 mm step-off along the articular margin of the lateral patellar facet on image 99/5. No other acute fracture observed. Deformity from an old healed proximal fibular shaft  fracture. Bony demineralization. Ligaments Suboptimally assessed by CT. Muscles and Tendons Unremarkable Soft tissues Moderate-sized lipohemarthrosis. Arterial atherosclerotic vascular calcification. IMPRESSION: 1. Mildly comminuted longitudinal fracture of the lateral patella. There is about 2 mm of step-off along the articular margin of the lateral patellar facet. 2. Moderate-sized lipohemarthrosis. 3. Atherosclerosis. Electronically Signed   By: Gaylyn Rong M.D.   On: 05/02/2017 14:49   Dg Knee Complete 4 Views Left  Result Date: 05/02/2017 CLINICAL DATA:  Fall on sidewalk.  Left knee pain EXAM: LEFT KNEE - COMPLETE 4+ VIEW COMPARISON:  None. FINDINGS: Vertically oriented lucency laterally in the patella, concerning for fracture although bipartite patella can have a similar appearance. Correlate with tenderness over the lateral patella. Moderate to large knee effusion.  Patellar marginal spurring. Deformity in the proximal fibula shaft compatible with healed fracture. Vascular calcifications noted. Slight irregularity posterolaterally along the lateral tibial plateau is likely attributable to spurring. IMPRESSION: 1. Moderate to large knee effusion. 2. Vertically oriented lucency laterally in the patella. Appearance favors patellar fracture but a bipartite patella could potentially have a similar appearance, correlate with point tenderness over the lateral patella. 3. Healed posttraumatic deformity in the left proximal fibula shaft. Electronically Signed   By: Gaylyn Rong M.D.   On: 05/02/2017 12:57    Procedures Procedures (including critical care time)  Medications Ordered in ED Medications  acetaminophen (TYLENOL) tablet 650 mg (650 mg Oral Given 05/02/17 1218)     Initial Impression / Assessment and Plan / ED Course  I have reviewed the triage vital signs and the nursing notes.  Pertinent labs & imaging results that were available during my care of the patient were reviewed by  me and considered in my medical decision making (see chart for details).  Clinical Course as of May 02 1852  Wed May 02, 2017  1403 Spoke with Marylene Land with care management regarding pt's care, she states pt needs to report to [facility] tomorrow and will be seen for follow up.   [JR]    Clinical Course User Index [JR] Russo, [place] N, PA-C    Patient with left knee pain status post mechanical fall. Knee is stable without wounds, normal range of motion and NV intact.  X-Ray Consistent with patellar fracture. Spoke with Dr. Linna Caprice with orthopedics, who recommends CT, patient be placed in an immobilizer splint and follow-up in one week. Pain managed in ED. CT confirming left patellar fracture. Patient given brace while in ED, conservative therapy recommended and discussed. [facility] provided walker for patient. Marylene Land with care management was very involved in this patient's care, to assure proper follow-up. Derek with [facility] came to see the patient before he was discharged, and brought patient to [facility] to be seen at discharge. Appreciate everyone's assistance with this patient's care.   Patient discussed with Dr. Freida Busman.  Discussed results, findings, treatment and follow up. Patient advised of return precautions. Patient verbalized understanding and agreed with plan.   Final Clinical Impressions(s) / ED Diagnoses   Final diagnoses:  Closed nondisplaced comminuted fracture of left patella, initial encounter    New Prescriptions  Discharge Medication List as of 05/02/2017  3:35 PM    START taking these medications   Details  naproxen (NAPROSYN) 500 MG tablet Take 1 tablet (500 mg total) by mouth 2 (two) times daily., Starting Wed 05/02/2017, Print         Timothy Lasso [place] N, PA-C 05/02/17 Lynelle Smoke    Lorre Nick, MD 05/05/17 1859

## 2017-05-03 ENCOUNTER — Emergency Department (HOSPITAL_COMMUNITY)
Admission: EM | Admit: 2017-05-03 | Discharge: 2017-05-05 | Disposition: A | Discharge status: Home / Self Care | Payer: Self-pay | Attending: Emergency Medicine | Admitting: Emergency Medicine

## 2017-05-03 ENCOUNTER — Emergency Department (HOSPITAL_COMMUNITY): Payer: Self-pay

## 2017-05-03 ENCOUNTER — Encounter (HOSPITAL_COMMUNITY): Payer: Self-pay | Admitting: *Deleted

## 2017-05-03 DIAGNOSIS — W19XXXA Unspecified fall, initial encounter: Secondary | ICD-10-CM

## 2017-05-03 DIAGNOSIS — R609 Edema, unspecified: Secondary | ICD-10-CM

## 2017-05-03 DIAGNOSIS — I10 Essential (primary) hypertension: Secondary | ICD-10-CM | POA: Insufficient documentation

## 2017-05-03 DIAGNOSIS — Y999 Unspecified external cause status: Secondary | ICD-10-CM | POA: Insufficient documentation

## 2017-05-03 DIAGNOSIS — R45851 Suicidal ideations: Secondary | ICD-10-CM

## 2017-05-03 DIAGNOSIS — Y929 Unspecified place or not applicable: Secondary | ICD-10-CM | POA: Insufficient documentation

## 2017-05-03 DIAGNOSIS — S82002A Unspecified fracture of left patella, initial encounter for closed fracture: Secondary | ICD-10-CM | POA: Insufficient documentation

## 2017-05-03 DIAGNOSIS — X500XXA Overexertion from strenuous movement or load, initial encounter: Secondary | ICD-10-CM | POA: Insufficient documentation

## 2017-05-03 DIAGNOSIS — Y9389 Activity, other specified: Secondary | ICD-10-CM | POA: Insufficient documentation

## 2017-05-03 DIAGNOSIS — E119 Type 2 diabetes mellitus without complications: Secondary | ICD-10-CM | POA: Insufficient documentation

## 2017-05-03 DIAGNOSIS — R52 Pain, unspecified: Secondary | ICD-10-CM

## 2017-05-03 LAB — BASIC METABOLIC PANEL
Anion gap: 10 (ref 5–15)
BUN: 11 mg/dL (ref 6–20)
CO2: 23 mmol/L (ref 22–32)
Calcium: 8.8 mg/dL — ABNORMAL LOW (ref 8.9–10.3)
Chloride: 104 mmol/L (ref 101–111)
Creatinine, Ser: 0.68 mg/dL (ref 0.61–1.24)
GFR calc Af Amer: >60 mL/min (ref >60)
GFR calc non Af Amer: >60 mL/min (ref >60)
Glucose, Bld: 162 mg/dL — ABNORMAL HIGH (ref 65–99)
POTASSIUM: 3.3 mmol/L — AB (ref 3.5–5.1)
Sodium: 137 mmol/L (ref 135–145)

## 2017-05-03 LAB — CBC WITH DIFFERENTIAL/PLATELET
BASOS ABS: 0 10*3/uL (ref 0.0–0.1)
Basophils Relative: 1 %
EOS ABS: 0.2 10*3/uL (ref 0.0–0.7)
Eosinophils Relative: 3 %
HCT: 34.9 % — ABNORMAL LOW (ref 39.0–52.0)
Hemoglobin: 12.1 g/dL — ABNORMAL LOW (ref 13.0–17.0)
Lymphocytes Relative: 23 %
Lymphs Abs: 1.6 10*3/uL (ref 0.7–4.0)
MCH: 29.9 pg (ref 26.0–34.0)
MCHC: 34.7 g/dL (ref 30.0–36.0)
MCV: 86.2 fL (ref 78.0–100.0)
MONO ABS: 0.5 10*3/uL (ref 0.1–1.0)
MONOS PCT: 7 %
NEUTROS ABS: 4.8 10*3/uL (ref 1.7–7.7)
NEUTROS PCT: 66 %
PLATELETS: 238 10*3/uL (ref 150–400)
RBC: 4.05 MIL/uL — ABNORMAL LOW (ref 4.22–5.81)
RDW: 13.8 % (ref 11.5–15.5)
WBC: 7.1 10*3/uL (ref 4.0–10.5)

## 2017-05-03 LAB — ETHANOL

## 2017-05-03 LAB — RAPID URINE DRUG SCREEN, HOSP PERFORMED
AMPHETAMINES: NOT DETECTED
BARBITURATES: NOT DETECTED
Benzodiazepines: NOT DETECTED
COCAINE: NOT DETECTED
OPIATES: NOT DETECTED
TETRAHYDROCANNABINOL: NOT DETECTED

## 2017-05-03 MED ORDER — ACETAMINOPHEN 325 MG PO TABS: 650.0000 mg | ORAL_TABLET | ORAL | Status: DC | PRN

## 2017-05-03 MED ORDER — NICOTINE 21 MG/24HR TD PT24
21.0000 mg | MEDICATED_PATCH | Freq: Every day | TRANSDERMAL | Status: DC
  Filled 2017-05-03 (×2): qty 1

## 2017-05-03 NOTE — ED Notes (Signed)
Pt unable to ambulate to restroom due to knee pain. Pt provided with urinal.

## 2017-05-03 NOTE — Discharge Instructions (Signed)
There was no change in the patellar fracture, after your fall today.  This fracture should heal without any surgery.  It is important to follow-up with the surgeon to make sure that you are getting better, next week.  In the meantime do not walk or stand without wearing the knee immobilizer.  For pain, take Tylenol, or Motrin.

## 2017-05-03 NOTE — ED Notes (Signed)
Pt states he will go jump off nearest bridge if we discharge him. Effie Shy MD informed. Pt changed to purple scrubs and belongings removed from room

## 2017-05-03 NOTE — BH Assessment (Signed)
Tele Assessment Note   Patient Name: Bryan Farrell MRN: [id number] Referring Physician: Dr. Effie Shy Location of Patient: [facility] Location of Provider: Behavioral Health TTS Department  Bryan Farrell is an 66 y.o. male. Pt reports SI with a plan to jump off a bridge. The Pt "states I'm suicidal because I'm homeless." Pt denies HI and AVH. Pt denies previous hospitalizations but EPIC notes report previous hospitalizations. Pt denies previous and current outpatient treatment. Pt denies SA and abuse. Pt reports multiple physical health concerns. Pt states "I'm in pain." Pt reports a strained relationship with his children. 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 Pt did not report dementia.   Shuvon, NP recommends am psych evaluation.  Diagnosis:  F33.1 MDD  Past Medical History:  Past Medical History:  Diagnosis Date  . Diabetes mellitus without complication (HCC)   . Hypertension     Past Surgical History:  Procedure Laterality Date  . TONSILLECTOMY      Family History: No family history on file.  Social History:  reports that he has never smoked. He has never used smokeless tobacco. He reports that he does not drink alcohol or use drugs.  Additional Social History:  Alcohol / Drug Use Pain Medications: please see mar Prescriptions: please see mar Over the Counter: please see mar History of alcohol / drug use?: No history of alcohol / drug abuse Longest period of sobriety (when/how long): NA  CIWA: CIWA-Ar BP: 118/75 Pulse Rate: 69 COWS:    PATIENT STRENGTHS: (choose at least two) Capable of independent living Communication skills  Allergies: No Known Allergies  Home Medications:  (Not in a hospital admission)  OB/GYN Status:  No LMP for male patient.  General Assessment Data Location of Assessment: [facility] ED TTS Assessment: In system Is this a Tele or Face-to-Face Assessment?: Tele Assessment Is this an Initial Assessment or a Re-assessment for this  encounter?: Initial Assessment Marital status: Divorced [place] name: NA Is patient pregnant?: No Pregnancy Status: No Living Arrangements: Other (Comment) (homeless) Can pt return to current living arrangement?: Yes Admission Status: Voluntary Is patient capable of signing voluntary admission?: Yes Referral Source: Self/Family/Friend Insurance type: Medicaid     Crisis Care Plan Living Arrangements: Other (Comment) (homeless) Legal Guardian: Other: (self) Name of Psychiatrist: Na Name of Therapist: NA  Education Status Is patient currently in school?: No Current Grade: NA Highest grade of school patient has completed: some college Name of school: NA Contact person: NA  Risk to self with the past 6 months Suicidal Ideation: Yes-Currently Present Has patient been a risk to self within the past 6 months prior to admission? : Yes Suicidal Intent: Yes-Currently Present Has patient had any suicidal intent within the past 6 months prior to admission? : Yes Is patient at risk for suicide?: Yes Suicidal Plan?: Yes-Currently Present Has patient had any suicidal plan within the past 6 months prior to admission? : Yes Specify Current Suicidal Plan: to jump off a bridge Access to Means: Yes Specify Access to Suicidal Means: access to a bridge What has been your use of drugs/alcohol within the last 12 months?: Pt denies Previous Attempts/Gestures: Yes How many times?: 1 Other Self Harm Risks: NA Triggers for Past Attempts: Unknown Intentional Self Injurious Behavior: None Family Suicide History: No Recent stressful life event(s): Other (Comment) (homelessness) Persecutory voices/beliefs?: No Depression: Yes Depression Symptoms: Feeling worthless/self pity, Feeling angry/irritable Substance abuse history and/or treatment for substance abuse?: No Suicide prevention information given to non-admitted patients: Not applicable  Risk  to Others within the past 6 months Homicidal  Ideation: No Does patient have any lifetime risk of violence toward others beyond the six months prior to admission? : No Thoughts of Harm to Others: No Current Homicidal Intent: No Current Homicidal Plan: No Access to Homicidal Means: No Identified Victim: NA History of harm to others?: No Assessment of Violence: None Noted Violent Behavior Description: NA Does patient have access to weapons?: No Criminal Charges Pending?: No Does patient have a court date: No Is patient on probation?: No  Psychosis Hallucinations: None noted Delusions: None noted  Mental Status Report Appearance/Hygiene: Unremarkable Eye Contact: Fair Motor Activity: Freedom of movement Speech: Logical/coherent Level of Consciousness: Alert Mood: Euthymic Affect: Appropriate to circumstance Anxiety Level: Minimal Thought Processes: Coherent, Relevant Judgement: Unimpaired Orientation: Person, Place, Time, Situation Obsessive Compulsive Thoughts/Behaviors: None  Cognitive Functioning Concentration: Normal Memory: Recent Intact, Remote Intact IQ: Average Insight: Poor Impulse Control: Fair Appetite: Fair Weight Loss: 0 Weight Gain: 0 Sleep: Decreased Total Hours of Sleep: 6 Vegetative Symptoms: None  ADLScreening [facility] Assessment Services) Patient's cognitive ability adequate to safely complete daily activities?: Yes Patient able to express need for assistance with ADLs?: Yes Independently performs ADLs?: Yes (appropriate for developmental age)  Prior Inpatient Therapy Prior Inpatient Therapy: Yes Prior Therapy Dates: multiple Prior Therapy Facilty/Provider(s): [facility], [facility] Reason for Treatment: SI  Prior Outpatient Therapy Prior Outpatient Therapy: No Prior Therapy Dates: NA Prior Therapy Facilty/Provider(s): NA Reason for Treatment: NA Does patient have an ACCT team?: No Does patient have Intensive In-House Services?  : No Does patient have [facility] services? : No Does patient have P4CC  services?: No  ADL Screening (condition at time of admission) Patient's cognitive ability adequate to safely complete daily activities?: Yes Is the patient deaf or have difficulty hearing?: No Does the patient have difficulty seeing, even when wearing glasses/contacts?: No Does the patient have difficulty concentrating, remembering, or making decisions?: No Patient able to express need for assistance with ADLs?: Yes Does the patient have difficulty dressing or bathing?: No Independently performs ADLs?: Yes (appropriate for developmental age)       Abuse/Neglect Assessment (Assessment to be complete while patient is alone) Physical Abuse: Denies Verbal Abuse: Denies Sexual Abuse: Denies Exploitation of patient/patient's resources: Denies Self-Neglect: Denies     Merchant navy officer (For Healthcare) Does Patient Have a Medical Advance Directive?: No    Additional Information 1:1 In Past 12 Months?: No CIRT Risk: No Elopement Risk: No Does patient have medical clearance?: Yes     Disposition:  Disposition Initial Assessment Completed for this Encounter: Yes Disposition of Patient: Other dispositions  This service was provided via telemedicine using a 2-way, interactive audio and video technology.  Names of all persons participating in this telemedicine service and their role in this encounter. Name: Assunta Found Role: NP  Name:  Role:   Name:  Role:   Name:  Role:     Emmit Pomfret 05/03/2017 5:25 PM

## 2017-05-03 NOTE — ED Triage Notes (Signed)
PT feel yesterday and hurt left knee and was placed in knee immobilizer.  PT then fell this am and wants to have his knee rechecked.  Questioned placement, pt is homeless.

## 2017-05-03 NOTE — ED Provider Notes (Signed)
[facility]-EMERGENCY DEPT Provider Note   CSN: [id number] Arrival date & time: 05/03/17  1003     History   Chief Complaint Chief Complaint  Patient presents with  . Fall  . Knee Pain    HPI Bryan Farrell is a 66 y.o. male.  He is here for evaluation of left knee pain.  He was walking, wearing his knee immobilizer when he misstepped, fell pain in his knee, and fell.  He did not injure anything when he fell.  He is being treated for a left patellar fracture with a knee immobilizer, with plan to follow-up with orthopedic physician next week.  He was evaluated yesterday, for a knee injury, found to have a patellar fracture that needed outpatient follow-up.  The patient is homeless.  He presents for evaluation, by EMS.  He denies fever, chills, vomiting or dizziness.  There are no other known modifying factors.  HPI  Past Medical History:  Diagnosis Date  . Diabetes mellitus without complication (HCC)   . Hypertension     Patient Active Problem List   Diagnosis Date Noted  . Adjustment disorder with depressed mood 04/13/2017  . Vitamin B12 deficiency 03/25/2017  . Vascular dementia 03/23/2017  . Suicidal ideation 03/23/2017  . Homelessness 03/23/2017  . Diabetes (HCC) 03/23/2017    Past Surgical History:  Procedure Laterality Date  . TONSILLECTOMY         Home Medications    Prior to Admission medications   Medication Sig Start Date End Date Taking? Authorizing Provider  naproxen (NAPROSYN) 500 MG tablet Take 1 tablet (500 mg total) by mouth 2 (two) times daily. 05/02/17   Russo, [place] N, PA-C    Family History No family history on file.  Social History Social History  Substance Use Topics  . Smoking status: Never Smoker  . Smokeless tobacco: Never Used  . Alcohol use No     Comment: BAC clear     Allergies   Patient has no known allergies.   Review of Systems Review of Systems  All other systems reviewed and are negative.    Physical  Exam Updated Vital Signs BP 116/81   Pulse 63   Temp 98.2 F (36.8 C) (Oral)   Resp 16   Ht 6' (1.829 m)   Wt 97.5 kg (215 lb)   SpO2 97%   BMI 29.16 kg/m   Physical Exam  Constitutional: He is oriented to person, place, and time. He appears well-developed and well-nourished. No distress.  HENT:  Head: Normocephalic and atraumatic.  Right Ear: External ear normal.  Left Ear: External ear normal.  Eyes: Pupils are equal, round, and reactive to light. Conjunctivae and EOM are normal.  Neck: Normal range of motion and phonation normal. Neck supple.  Cardiovascular: Normal rate and regular rhythm.   Pulmonary/Chest: Effort normal. He exhibits no bony tenderness.  Musculoskeletal:  Left knee is swollen with large effusion.  Left knee diffusely tender anteriorly.  Knee extension is intact.  Neurological: He is alert and oriented to person, place, and time. No cranial nerve deficit or sensory deficit. He exhibits normal muscle tone. Coordination normal.  Skin: Skin is warm, dry and intact.  Psychiatric: He has a normal mood and affect. His behavior is normal. Judgment and thought content normal.  Nursing note and vitals reviewed.    ED Treatments / Results  Labs (all labs ordered are listed, but only abnormal results are displayed) Labs Reviewed - No data to display  EKG  EKG Interpretation None       Radiology Dg Knee 1-2 Views Left  Result Date: 05/03/2017 CLINICAL DATA:  Larey Seat yesterday with pain EXAM: LEFT KNEE - 1-2 VIEW COMPARISON:  Right knee films of 05/02/2017 FINDINGS: There is a moderate size left knee joint effusion present. No fracture is seen. Joint spaces are relatively normal for age. IMPRESSION: Moderate-sized left knee joint effusion. Electronically Signed   By: Dwyane Dee M.D.   On: 05/03/2017 14:02   Ct Knee Left Wo Contrast  Result Date: 05/02/2017 CLINICAL DATA:  Fall. Patellar pain. Possible vertical fracture of the patella on conventional  radiography. EXAM: CT OF THE Left KNEE WITHOUT CONTRAST TECHNIQUE: Multidetector CT imaging of the left knee was performed according to the standard protocol. Multiplanar CT image reconstructions were also generated. COMPARISON:  05/02/2017 radiographs FINDINGS: Bones/Joint/Cartilage CT confirms an acute longitudinal fracture of the left patella extending vertically the and including the lateral patellar facet. 2 mm step-off along the articular margin of the lateral patellar facet on image 99/5. No other acute fracture observed. Deformity from an old healed proximal fibular shaft fracture. Bony demineralization. Ligaments Suboptimally assessed by CT. Muscles and Tendons Unremarkable Soft tissues Moderate-sized lipohemarthrosis. Arterial atherosclerotic vascular calcification. IMPRESSION: 1. Mildly comminuted longitudinal fracture of the lateral patella. There is about 2 mm of step-off along the articular margin of the lateral patellar facet. 2. Moderate-sized lipohemarthrosis. 3. Atherosclerosis. Electronically Signed   By: Gaylyn Rong M.D.   On: 05/02/2017 14:49   Dg Knee Complete 4 Views Left  Result Date: 05/02/2017 CLINICAL DATA:  Fall on sidewalk.  Left knee pain EXAM: LEFT KNEE - COMPLETE 4+ VIEW COMPARISON:  None. FINDINGS: Vertically oriented lucency laterally in the patella, concerning for fracture although bipartite patella can have a similar appearance. Correlate with tenderness over the lateral patella. Moderate to large knee effusion.  Patellar marginal spurring. Deformity in the proximal fibula shaft compatible with healed fracture. Vascular calcifications noted. Slight irregularity posterolaterally along the lateral tibial plateau is likely attributable to spurring. IMPRESSION: 1. Moderate to large knee effusion. 2. Vertically oriented lucency laterally in the patella. Appearance favors patellar fracture but a bipartite patella could potentially have a similar appearance, correlate with  point tenderness over the lateral patella. 3. Healed posttraumatic deformity in the left proximal fibula shaft. Electronically Signed   By: Gaylyn Rong M.D.   On: 05/02/2017 12:57    Procedures Procedures (including critical care time)  Medications Ordered in ED Medications - No data to display   Initial Impression / Assessment and Plan / ED Course  I have reviewed the triage vital signs and the nursing notes.  Pertinent labs & imaging results that were available during my care of the patient were reviewed by me and considered in my medical decision making (see chart for details).  Clinical Course as of May 04 1599  Thu May 03, 2017  1506 The patient was being ready for disposition/discharge, when he told the nurse that he was suicidal.  At this time the patient states that he is thinking about suicide, and he is not sure if he has a plan or not.  He would like to "go to the [facility]."  While initially medical clearance evaluation, and consult TTS.  [EW]    Clinical Course User Index [EW] Mancel Bale, MD      Final Clinical Impressions(s) / ED Diagnoses   Final diagnoses:  Fall, initial encounter  Closed nondisplaced fracture of left patella, unspecified  fracture morphology, initial encounter  Suicidal ideation   Stable patella fracture not needing intervention at this time.  Will likely heal with simple treatment of knee immobilization.  Patient needs to follow-up with orthopedist in 1 week for reassessment.  Patient has recurrent suicidal ideation, associated with his homelessness, without clear plan.  I think he is low risk, but since he is homeless will err on the side of reassessment by TTS.  Nursing Notes Reviewed/ Care Coordinated Applicable Imaging Reviewed Interpretation of Laboratory Data incorporated into ED treatment   Plan-as per TTS in conjunction with oncoming provider team   New Prescriptions New Prescriptions   No medications on  file     Mancel Bale, MD 05/03/17 [id number]

## 2017-05-03 NOTE — ED Notes (Signed)
Will wrap Pt L knee once Consult is finished.

## 2017-05-04 ENCOUNTER — Encounter (HOSPITAL_COMMUNITY): Payer: Self-pay | Admitting: Registered Nurse

## 2017-05-04 DIAGNOSIS — F331 Major depressive disorder, recurrent, moderate: Secondary | ICD-10-CM | POA: Insufficient documentation

## 2017-05-04 DIAGNOSIS — R45851 Suicidal ideations: Secondary | ICD-10-CM

## 2017-05-04 MED ORDER — ONDANSETRON 4 MG PO TBDP
8.0000 mg | ORAL_TABLET | Freq: Three times a day (TID) | ORAL | Status: DC | PRN
  Administered 2017-05-04: 8 mg via ORAL
  Filled 2017-05-04: qty 2

## 2017-05-04 NOTE — Consult Note (Signed)
Telepsych Consultation   Reason for Consult: Suicidal ideation  Referring Physician: Daleen Bo, MD Location of Patient: [facility] Location of Provider: [facility]  Patient Identification: Bryan Farrell MRN:  [id number] Principal Diagnosis: <principal problem not specified> Diagnosis:   Patient Active Problem List   Diagnosis Date Noted  . Adjustment disorder with depressed mood [F43.21] 04/13/2017  . Vitamin B12 deficiency [E53.8] 03/25/2017  . Vascular dementia [F01.50] 03/23/2017  . Suicidal ideation [R45.851] 03/23/2017  . Homelessness [Z59.0] 03/23/2017  . Diabetes ([place]) [E11.9] 03/23/2017    Total Time spent with patient: 45 minutes  Subjective:   Bryan Farrell is a 66 y.o. male patient presented to [facility] with complaints of suicidal ideation and plan to jump off bridge .  HPI:  Bryan Farrell, 66 y.o., male patient seen by this provider on 05/04/17.  Chart reviewed and consulted with Dr. Dwyane Dee.  On evaluation Bryan Farrell reports that he came to hospital because he fell.  Patient was unable to tell me what happened to make fall.  Reports that he continues to have suicidal thoughts without plan.  Denies homicidal ideation, psychosis, and paranoia.  Also states that he is homeless.  Past Psychiatric History: Depression, prior suicide attempt 2011  Risk to Self: Suicidal Ideation: Yes-Currently Present Suicidal Intent: Yes-Currently Present Is patient at risk for suicide?: Yes Suicidal Plan?: Yes-Currently Present Specify Current Suicidal Plan: to jump off a bridge Access to Means: Yes Specify Access to Suicidal Means: access to a bridge What has been your use of drugs/alcohol within the last 12 months?: Pt denies How many times?: 1 Other Self Harm Risks: NA Triggers for Past Attempts: Unknown Intentional Self Injurious Behavior: None Risk to Others: Homicidal Ideation: No Thoughts of Harm to Others: No Current Homicidal Intent:  No Current Homicidal Plan: No Access to Homicidal Means: No Identified Victim: NA History of harm to others?: No Assessment of Violence: None Noted Violent Behavior Description: NA Does patient have access to weapons?: No Criminal Charges Pending?: No Does patient have a court date: No Prior Inpatient Therapy: Prior Inpatient Therapy: Yes Prior Therapy Dates: multiple Prior Therapy Facilty/Provider(s): [facility] [place] Reason for Treatment: SI Prior Outpatient Therapy: Prior Outpatient Therapy: No Prior Therapy Dates: NA Prior Therapy Facilty/Provider(s): NA Reason for Treatment: NA Does patient have an ACCT team?: No Does patient have Intensive In-House Services?  : No Does patient have [facility] services? : No Does patient have P4CC services?: No  Past Medical History:  Past Medical History:  Diagnosis Date  . Diabetes mellitus without complication ([place])   . Hypertension     Past Surgical History:  Procedure Laterality Date  . TONSILLECTOMY     Family History: History reviewed. No pertinent family history. Family Psychiatric  History: Unaware Social History:  History  Alcohol Use No    Comment: BAC clear     History  Drug Use No    Comment: UDS clear    Social History   Social History  . Marital status: Single    Spouse name: N/A  . Number of children: N/A  . Years of education: N/A   Social History Main Topics  . Smoking status: Never Smoker  . Smokeless tobacco: Never Used  . Alcohol use No     Comment: BAC clear  . Drug use: No     Comment: UDS clear  . Sexual activity: No   Other Topics Concern  . None   Social History Narrative  . None  Additional Social History:    Allergies:  No Known Allergies  Labs:  Results for orders placed or performed during the hospital encounter of 05/03/17 (from the past 48 hour(s))  Basic metabolic panel     Status: Abnormal   Collection Time: 05/03/17  3:50 PM  Result Value Ref Range   Sodium 137 135 - 145  mmol/L   Potassium 3.3 (L) 3.5 - 5.1 mmol/L   Chloride 104 101 - 111 mmol/L   CO2 23 22 - 32 mmol/L   Glucose, Bld 162 (H) 65 - 99 mg/dL   BUN 11 6 - 20 mg/dL   Creatinine, Ser 0.68 0.61 - 1.24 mg/dL   Calcium 8.8 (L) 8.9 - 10.3 mg/dL   GFR calc non Af Amer >60 >60 mL/min   GFR calc Af Amer >60 >60 mL/min    Comment: (NOTE) The eGFR has been calculated using the CKD EPI equation. This calculation has not been validated in all clinical situations. eGFR's persistently <60 mL/min signify possible Chronic Kidney Disease.    Anion gap 10 5 - 15  CBC with Differential     Status: Abnormal   Collection Time: 05/03/17  3:50 PM  Result Value Ref Range   WBC 7.1 4.0 - 10.5 K/uL   RBC 4.05 (L) 4.22 - 5.81 MIL/uL   Hemoglobin 12.1 (L) 13.0 - 17.0 g/dL   HCT 34.9 (L) 39.0 - 52.0 %   MCV 86.2 78.0 - 100.0 fL   MCH 29.9 26.0 - 34.0 pg   MCHC 34.7 30.0 - 36.0 g/dL   RDW 13.8 11.5 - 15.5 %   Platelets 238 150 - 400 K/uL   Neutrophils Relative % 66 %   Neutro Abs 4.8 1.7 - 7.7 K/uL   Lymphocytes Relative 23 %   Lymphs Abs 1.6 0.7 - 4.0 K/uL   Monocytes Relative 7 %   Monocytes Absolute 0.5 0.1 - 1.0 K/uL   Eosinophils Relative 3 %   Eosinophils Absolute 0.2 0.0 - 0.7 K/uL   Basophils Relative 1 %   Basophils Absolute 0.0 0.0 - 0.1 K/uL  Ethanol     Status: None   Collection Time: 05/03/17  3:50 PM  Result Value Ref Range   Alcohol, Ethyl (B) <5 <5 mg/dL    Comment:        LOWEST DETECTABLE LIMIT FOR SERUM ALCOHOL IS 5 mg/dL FOR MEDICAL PURPOSES ONLY   Urine rapid drug screen (hosp performed)     Status: None   Collection Time: 05/03/17  5:55 PM  Result Value Ref Range   Opiates NONE DETECTED NONE DETECTED   Cocaine NONE DETECTED NONE DETECTED   Benzodiazepines NONE DETECTED NONE DETECTED   Amphetamines NONE DETECTED NONE DETECTED   Tetrahydrocannabinol NONE DETECTED NONE DETECTED   Barbiturates NONE DETECTED NONE DETECTED    Comment:        DRUG SCREEN FOR MEDICAL  PURPOSES ONLY.  IF CONFIRMATION IS NEEDED FOR ANY PURPOSE, NOTIFY LAB WITHIN 5 DAYS.        LOWEST DETECTABLE LIMITS FOR URINE DRUG SCREEN Drug Class       Cutoff (ng/mL) Amphetamine      1000 Barbiturate      200 Benzodiazepine   976 Tricyclics       734 Opiates          300 Cocaine          300 THC              50  Medications:  Current Facility-Administered Medications  Medication Dose Route Frequency Provider Last Rate Last Dose  . acetaminophen (TYLENOL) tablet 650 mg  650 mg Oral Q4H PRN Daleen Bo, MD      . nicotine (NICODERM CQ - dosed in mg/24 hours) patch 21 mg  21 mg Transdermal Daily Daleen Bo, MD   Stopped at 05/03/17 1750   Current Outpatient Prescriptions  Medication Sig Dispense Refill  . naproxen (NAPROSYN) 500 MG tablet Take 1 tablet (500 mg total) by mouth 2 (two) times daily. 30 tablet 0    Musculoskeletal: Strength & Muscle Tone: within normal limits Gait & Station: normal Patient leans: N/A  Psychiatric Specialty Exam: Physical Exam  ROS  Blood pressure 120/80, pulse 76, temperature 98.2 F (36.8 C), temperature source Oral, resp. rate 18, height 6' (1.829 m), weight 97.5 kg (215 lb), SpO2 99 %.Body mass index is 29.16 kg/m.  General Appearance: Casual  Eye Contact:  Good  Speech:  Clear and Coherent and Normal Rate  Volume:  Normal  Mood:  Depressed and Irritable  Affect:  Congruent and Depressed  Thought Process:  Goal Directed  Orientation:  Full (Time, Place, and Person)  Thought Content:  Denies hallucinations, psychosis, and paranoia  Suicidal Thoughts:  "Some"  denies intent and plan  Homicidal Thoughts:  No  Memory:  Immediate;   Good Recent;   Good Remote;   Good  Judgement:  Fair  Insight:  Fair  Psychomotor Activity:  Normal  Concentration:  Concentration: Fair and Attention Span: Fair  Recall:  Good  Fund of Knowledge:  Fair  Language:  Good  Akathisia:  No  Handed:  Right  AIMS (if indicated):     Assets:   Leisure Time Physical Health Resilience  ADL's:  Intact  Cognition:  WNL  Sleep:      Assessment:  Passive suicidal thoughts with no intent or plan.  Chronic history of suicidal thoughts on and off.  Patient Denies homicidal ideation, psychosis, and psychosis.  Patient psychiatrically cleared.  Can be discharged when medically   Treatment Plan Summary: Plan Discharge home; resources for outpatient medication management and therapy  Disposition: No evidence of imminent risk to self or others at present.    Social work to set patient up with outpatient services to assist with medication management and therapy    This service was provided via telemedicine using a 2-way, interactive audio and Radiographer, therapeutic.  Names of all persons participating in this telemedicine service and their role in this encounter. Name: Earleen Newport NP Role: Telepsych  Name: Dr Dwyane Dee Role: Psychiatrist  Name:  Role:   Name:  Role:     Earleen Newport, NP 05/04/2017 1:48 PM

## 2017-05-04 NOTE — ED Notes (Signed)
Patient eating breakfast. °

## 2017-05-04 NOTE — ED Notes (Signed)
Pt does not want a snack

## 2017-05-04 NOTE — ED Notes (Signed)
Pt has had several episodes of emesis.  States he does not feel nauseous in between episodes.  States he is not experiencing any pain.  This RN offered medication due to this being the third episode of vomiting.  MD ordered and aware patient was also seen earlier for injuries/fall.

## 2017-05-04 NOTE — ED Notes (Signed)
Pt had another episode of vomiting. States he is not in any pain and does not feel nauseous. Patient assessed and cleaned, vitals taken and appear to be within normal limits aside from being hypertensive.

## 2017-05-04 NOTE — ED Notes (Signed)
Patient given a snack 

## 2017-05-05 ENCOUNTER — Emergency Department (HOSPITAL_COMMUNITY)
Admission: EM | Admit: 2017-05-05 | Discharge: 2017-05-05 | Disposition: A | Discharge status: Home / Self Care | Payer: Self-pay | Attending: Emergency Medicine | Admitting: Emergency Medicine

## 2017-05-05 ENCOUNTER — Emergency Department (HOSPITAL_COMMUNITY): Payer: Self-pay

## 2017-05-05 ENCOUNTER — Encounter (HOSPITAL_COMMUNITY): Payer: Self-pay | Admitting: Emergency Medicine

## 2017-05-05 DIAGNOSIS — F329 Major depressive disorder, single episode, unspecified: Secondary | ICD-10-CM | POA: Insufficient documentation

## 2017-05-05 DIAGNOSIS — E119 Type 2 diabetes mellitus without complications: Secondary | ICD-10-CM | POA: Insufficient documentation

## 2017-05-05 DIAGNOSIS — M25562 Pain in left knee: Secondary | ICD-10-CM | POA: Insufficient documentation

## 2017-05-05 DIAGNOSIS — I10 Essential (primary) hypertension: Secondary | ICD-10-CM | POA: Insufficient documentation

## 2017-05-05 DIAGNOSIS — R45851 Suicidal ideations: Secondary | ICD-10-CM | POA: Insufficient documentation

## 2017-05-05 LAB — RAPID URINE DRUG SCREEN, HOSP PERFORMED
AMPHETAMINES: NOT DETECTED
BARBITURATES: NOT DETECTED
Benzodiazepines: NOT DETECTED
Cocaine: NOT DETECTED
Opiates: NOT DETECTED
TETRAHYDROCANNABINOL: NOT DETECTED

## 2017-05-05 LAB — COMPREHENSIVE METABOLIC PANEL
ALBUMIN: 3.7 g/dL (ref 3.5–5.0)
ALK PHOS: 67 U/L (ref 38–126)
ALT: 15 U/L — AB (ref 17–63)
AST: 13 U/L — AB (ref 15–41)
Anion gap: 10 (ref 5–15)
BILIRUBIN TOTAL: 1 mg/dL (ref 0.3–1.2)
BUN: 12 mg/dL (ref 6–20)
CALCIUM: 9.2 mg/dL (ref 8.9–10.3)
CO2: 26 mmol/L (ref 22–32)
CREATININE: 0.79 mg/dL (ref 0.61–1.24)
Chloride: 101 mmol/L (ref 101–111)
GFR calc Af Amer: >60 mL/min (ref >60)
GLUCOSE: 179 mg/dL — AB (ref 65–99)
Potassium: 3.3 mmol/L — ABNORMAL LOW (ref 3.5–5.1)
Sodium: 137 mmol/L (ref 135–145)
TOTAL PROTEIN: 6.7 g/dL (ref 6.5–8.1)

## 2017-05-05 LAB — ACETAMINOPHEN LEVEL: Acetaminophen (Tylenol), Serum: <10 ug/mL — ABNORMAL LOW (ref 10–30)

## 2017-05-05 LAB — CBC
HEMATOCRIT: 38.5 % — AB (ref 39.0–52.0)
HEMOGLOBIN: 13.2 g/dL (ref 13.0–17.0)
MCH: 29.4 pg (ref 26.0–34.0)
MCHC: 34.3 g/dL (ref 30.0–36.0)
MCV: 85.7 fL (ref 78.0–100.0)
Platelets: 296 10*3/uL (ref 150–400)
RBC: 4.49 MIL/uL (ref 4.22–5.81)
RDW: 13.6 % (ref 11.5–15.5)
WBC: 8.4 10*3/uL (ref 4.0–10.5)

## 2017-05-05 LAB — SALICYLATE LEVEL: Salicylate Lvl: <7 mg/dL (ref 2.8–30.0)

## 2017-05-05 LAB — ETHANOL

## 2017-05-05 MED ORDER — POTASSIUM CHLORIDE CRYS ER 20 MEQ PO TBCR
20.0000 meq | EXTENDED_RELEASE_TABLET | Freq: Two times a day (BID) | ORAL | Status: DC
  Administered 2017-05-05: 20 meq via ORAL
  Filled 2017-05-05: qty 1

## 2017-05-05 NOTE — ED Provider Notes (Signed)
  Physical Exam  BP 115/72 (BP Location: Left Arm)   Pulse 70   Temp 98.3 F (36.8 C) (Oral)   Resp 19   Ht 6' (1.829 m)   Wt 97.5 kg (215 lb)   SpO2 99%   BMI 29.16 kg/m   Physical Exam  ED Course  Procedures  MDM Seen by psychiatry and cleared. Seen by social work and offered resources. Knee is stable. Discharge home.       Benjiman Core, MD 05/05/17 1151

## 2017-05-05 NOTE — ED Notes (Signed)
Pt noted w/splint to right hand/wrist and knee immobilizer to left leg - both intact - pt able to wiggle fingers and toes w/o difficulty.

## 2017-05-05 NOTE — ED Provider Notes (Signed)
[facility]-EMERGENCY DEPT Provider Note   CSN: [id number] Arrival date & time: 05/05/17  1456     History   Chief Complaint Chief Complaint  Patient presents with  . Knee Pain  . Suicidal    HPI Bryan Farrell is a 66 y.o. male.  66 year old male presents with left knee pain after fall today. Patient has multiple ED visits for chronic suicidal ideations. Was just discharged from [facility] today and was found outside complaining of knee pain. He has chronic suicidal ideations without a plan. He is currently homeless. Denies responding to internal stimuli. Denies any auditory hallucinations. Denies any current use of alcohol. No complaints of hip discomfort. His left knee is currently in a brace and denies any left foot pain or numbness.      Past Medical History:  Diagnosis Date  . Diabetes mellitus without complication ([facility])   . Hypertension     Patient Active Problem List   Diagnosis Date Noted  . Moderate recurrent major depression ([facility])   . Adjustment disorder with depressed mood 04/13/2017  . Vitamin B12 deficiency 03/25/2017  . Vascular dementia 03/23/2017  . Suicidal ideation 03/23/2017  . Homelessness 03/23/2017  . Diabetes ([facility]) 03/23/2017    Past Surgical History:  Procedure Laterality Date  . TONSILLECTOMY         Home Medications    Prior to Admission medications   Not on File    Family History History reviewed. No pertinent family history.  Social History Social History  Substance Use Topics  . Smoking status: Never Smoker  . Smokeless tobacco: Never Used  . Alcohol use No     Comment: BAC clear     Allergies   Patient has no known allergies.   Review of Systems Review of Systems  All other systems reviewed and are negative.    Physical Exam Updated Vital Signs There were no vitals taken for this visit.  Physical Exam  Constitutional: He is oriented to person, place, and time. He appears well-developed and well-nourished.   Non-toxic appearance. No distress.  HENT:  Head: Normocephalic and atraumatic.  Eyes: Pupils are equal, round, and reactive to light. Conjunctivae, EOM and lids are normal.  Neck: Normal range of motion. Neck supple. No tracheal deviation present. No thyroid mass present.  Cardiovascular: Normal rate, regular rhythm and normal heart sounds.  Exam reveals no gallop.   No murmur heard. Pulmonary/Chest: Effort normal and breath sounds normal. No stridor. No respiratory distress. He has no decreased breath sounds. He has no wheezes. He has no rhonchi. He has no rales.  Abdominal: Soft. Normal appearance and bowel sounds are normal. He exhibits no distension. There is no tenderness. There is no rebound and no CVA tenderness.  Musculoskeletal: Normal range of motion. He exhibits no edema or tenderness.       Legs: nvt intact at left foot  Neurological: He is alert and oriented to person, place, and time. He has normal strength. No cranial nerve deficit or sensory deficit. GCS eye subscore is 4. GCS verbal subscore is 5. GCS motor subscore is 6.  Skin: Skin is warm and dry. No abrasion and no rash noted.  Psychiatric: He has a normal mood and affect. His speech is normal and behavior is normal. He is not actively hallucinating. He expresses no suicidal plans and no homicidal plans. He is attentive.  Nursing note and vitals reviewed.    ED Treatments / Results  Labs (all labs ordered are listed, but only  abnormal results are displayed) Labs Reviewed  COMPREHENSIVE METABOLIC PANEL - Abnormal; Notable for the following:       Result Value   Potassium 3.3 (*)    Glucose, Bld 179 (*)    AST 13 (*)    ALT 15 (*)    All other components within normal limits  ACETAMINOPHEN LEVEL - Abnormal; Notable for the following:    Acetaminophen (Tylenol), Serum <10 (*)    All other components within normal limits  CBC - Abnormal; Notable for the following:    HCT 38.5 (*)    All other components within  normal limits  ETHANOL  SALICYLATE LEVEL  RAPID URINE DRUG SCREEN, HOSP PERFORMED    EKG  EKG Interpretation None       Radiology No results found.  Procedures Procedures (including critical care time)  Medications Ordered in ED Medications - No data to display   Initial Impression / Assessment and Plan / ED Course  I have reviewed the triage vital signs and the nursing notes.  Pertinent labs & imaging results that were available during my care of the patient were reviewed by me and considered in my medical decision making (see chart for details).    Patient does not appear to be responding to internal stimuli. He is not hallucinating. Knee x-rays unchanged. Will be given resources for his chronic suicidal ideations. He is to have a plan for suicide at this time. It is well-documented that these are chronic.  Final Clinical Impressions(s) / ED Diagnoses   Final diagnoses:  None    New Prescriptions New Prescriptions   No medications on file     Lorre Nick, MD 05/05/17 1814

## 2017-05-05 NOTE — Progress Notes (Signed)
Patient was recommended discharge due to not meeting inpatient treatment criteria, per Evlyn Clines NP, on 05/04/17. [facility] RN Lurena Joiner was informed of patient's d/c recommendation.  Melbourne Abts, MSW, LCSWA Clinical social worker in disposition [facility], TTS Office

## 2017-05-05 NOTE — ED Notes (Signed)
Lunch tray on bedside table - pt declined to eat. States "I'm not going to eat anything".

## 2017-05-05 NOTE — ED Notes (Signed)
Bed: [facility] Expected date:  Expected time:  Means of arrival:  Comments: Triage 3 

## 2017-05-05 NOTE — ED Notes (Signed)
SW aware of pt. 

## 2017-05-05 NOTE — ED Notes (Addendum)
RN assisted pt w/putting shoe on left foot. Pt verbalized understanding to follow up w/Ortho. Security escorting pt to bus stop. Bus pass given to security.

## 2017-05-05 NOTE — ED Notes (Signed)
Offered for pt to shower - declined - states he will later.

## 2017-05-05 NOTE — Clinical Social Work Note (Addendum)
Clinical Social Work Assessment  Patient Details  Name: Bryan Farrell MRN: [id number] Date of Birth: [birth year]  Date of referral:  05/05/17               Reason for consult:  Housing Concerns/Homelessness                Permission sought to share information with:    Permission granted to share information::  No  Name::        Agency::     Relationship::     Contact Information:     Housing/Transportation Living arrangements for the past 2 months:  Homeless Source of Information:  Patient Patient Interpreter Needed:  None Criminal Activity/Legal Involvement Pertinent to Current Situation/Hospitalization:  No - Comment as needed Significant Relationships:  None (Patient stated he has a son and daughter and ex-wife) Lives with:    Do you feel safe going back to the place where you live?    Need for family participation in patient care:  Yes (Comment)  Care giving concerns:  Patient is homeless. Patient has an immobilizer on his right leg after recent fall that damaged his right knee.   Social Worker assessment / plan:  Patient was in the ED at [facility] due to a fall that occurred on sidewalk in downtown [place] on 91/19/18. During assessment, patient repeatedly stated that he had no idea why he was at the hospital to begin with. CSW informed patient he was currently at the hospital due to recent fall. Patient has no recollection of fall or days prior to assessment. CSW inquired about social support system, patient stated he had a son and daugther, and their mother (his ex-wife) were not available. Patient received a verbal declination from his children when he asked about living with them post discharge. Patient stated that he would not be using the resource list that was provided to him.  Due to no further needs identified, CSW to sign off. Patient to be discharged.   Employment status:   Unable to assess at this time. Insurance information:  Other (Comment Required) PT  Recommendations:  Not assessed at this time Information / Referral to community resources:  Shelter  Patient/Family's Response to care:  No family present at patient's bedside. Patient stated that he has informed his son, daughter, and ex-wife of his current status. Family is not accepting to let patient live in their homes. Patient slightly agitated due to being unaware of why he is at the hospital. Patient understanding that he did not qualify for placement at a facility.  Patient/Family's Understanding of and Emotional Response to Diagnosis, Current Treatment, and Prognosis:  Patient understanding of discharge plan to home (shelter), unhappy with lack of availability for resources.  Emotional Assessment Appearance:   Agitated. Attitude/Demeanor/Rapport:    Affect (typically observed):  Accepting Orientation:  Oriented to self only. Alcohol / Substance use:    Psych involvement (Current and /or in the community):  No (Comment)  Discharge Needs  Concerns to be addressed:  Homelessness Readmission within the last 30 days:    Current discharge risk:  Homeless Barriers to Discharge:   Patient is homeless, lacks support system.    Inis Sizer, LCSW 05/05/2017, 1:40 PM

## 2017-05-05 NOTE — ED Notes (Signed)
Dr Rubin Payor aware pt cleared by [facility], ambulated, and SW consult complete - pt refused resources.

## 2017-05-05 NOTE — ED Notes (Signed)
Patient transported to X-ray 

## 2017-05-05 NOTE — ED Notes (Signed)
Diet Caff-Free Coke given as requested for snack. Pt has sugar-free cookies on bedside table.

## 2017-05-05 NOTE — ED Notes (Signed)
Bed: [facility] Expected date:  Expected time:  Means of arrival:  Comments: 

## 2017-05-05 NOTE — ED Notes (Signed)
Left message for SW and CM to return call.

## 2017-05-05 NOTE — ED Triage Notes (Addendum)
Per EMS, patient found in grass, d/c from [facility] at 1100 today. Reports fall yesterday c/o left knee pain. Unable to bear weight. Knee immobilizer in place. Reports SI x4 months. Denies plan. Denies HI/A/VH.  BP 160/90 HR 90 RR 16

## 2017-05-05 NOTE — ED Notes (Signed)
Pt dressing himself - refused assistance from staff.

## 2017-05-05 NOTE — ED Notes (Addendum)
SW w/pt. Prior to SW arrival, pt asking RN why he is here. Advised pt d/t fall and that he then stated he was SI and was cleared by [facility] yesterday so may be d/c'd.

## 2017-05-05 NOTE — ED Notes (Addendum)
Pt ambulated slowly w/knee immobilizer noted to left leg intact to hallway and back to bed. States he "I feel OK walking". Pt states he is homeless and has nowhere to go if he is d/c'd. States has been homeless x 4 years. Offered to call pt's x-wife as she has told SW's in past she will assist pt. Pt refused - stating he does not want help from her d/t "she has moved on with her life and doesn't need to worry about me". Offered for pt to shower x 2 - refused.

## 2017-05-06 ENCOUNTER — Emergency Department (HOSPITAL_COMMUNITY)
Admission: EM | Admit: 2017-05-06 | Discharge: 2017-05-06 | Disposition: A | Discharge status: Home / Self Care | Payer: Self-pay | Attending: Emergency Medicine | Admitting: Emergency Medicine

## 2017-05-06 ENCOUNTER — Encounter (HOSPITAL_COMMUNITY): Payer: Self-pay | Admitting: Emergency Medicine

## 2017-05-06 DIAGNOSIS — W19XXXD Unspecified fall, subsequent encounter: Secondary | ICD-10-CM | POA: Insufficient documentation

## 2017-05-06 DIAGNOSIS — Z590 Homelessness: Secondary | ICD-10-CM | POA: Insufficient documentation

## 2017-05-06 DIAGNOSIS — I10 Essential (primary) hypertension: Secondary | ICD-10-CM | POA: Insufficient documentation

## 2017-05-06 DIAGNOSIS — R451 Restlessness and agitation: Secondary | ICD-10-CM | POA: Insufficient documentation

## 2017-05-06 DIAGNOSIS — S82001D Unspecified fracture of right patella, subsequent encounter for closed fracture with routine healing: Secondary | ICD-10-CM | POA: Insufficient documentation

## 2017-05-06 DIAGNOSIS — E119 Type 2 diabetes mellitus without complications: Secondary | ICD-10-CM | POA: Insufficient documentation

## 2017-05-06 DIAGNOSIS — E876 Hypokalemia: Secondary | ICD-10-CM | POA: Insufficient documentation

## 2017-05-06 DIAGNOSIS — M25562 Pain in left knee: Secondary | ICD-10-CM | POA: Insufficient documentation

## 2017-05-06 DIAGNOSIS — R45851 Suicidal ideations: Secondary | ICD-10-CM | POA: Insufficient documentation

## 2017-05-06 LAB — I-STAT CHEM 8, ED
BUN: 15 mg/dL (ref 6–20)
CALCIUM ION: 1.15 mmol/L (ref 1.15–1.40)
Chloride: 99 mmol/L — ABNORMAL LOW (ref 101–111)
Creatinine, Ser: 0.7 mg/dL (ref 0.61–1.24)
Glucose, Bld: 190 mg/dL — ABNORMAL HIGH (ref 65–99)
HEMATOCRIT: 41 % (ref 39.0–52.0)
HEMOGLOBIN: 13.9 g/dL (ref 13.0–17.0)
Potassium: 3.4 mmol/L — ABNORMAL LOW (ref 3.5–5.1)
SODIUM: 139 mmol/L (ref 135–145)
TCO2: 27 mmol/L (ref 22–32)

## 2017-05-06 MED ORDER — ACETAMINOPHEN 325 MG PO TABS
650.0000 mg | ORAL_TABLET | Freq: Once | ORAL | Status: DC
  Filled 2017-05-06: qty 2

## 2017-05-06 MED ORDER — POTASSIUM CHLORIDE CRYS ER 20 MEQ PO TBCR
EXTENDED_RELEASE_TABLET | ORAL | Status: AC
  Filled 2017-05-06: qty 1

## 2017-05-06 MED ORDER — POTASSIUM CHLORIDE CRYS ER 20 MEQ PO TBCR
40.0000 meq | EXTENDED_RELEASE_TABLET | Freq: Once | ORAL | Status: AC
  Administered 2017-05-06: 40 meq via ORAL
  Filled 2017-05-06: qty 2

## 2017-05-06 NOTE — ED Provider Notes (Addendum)
[facility] Provider Note   CSN: [id number] Arrival date & time: 05/06/17  1022 Level V caveat psychiatric complaints    History   Chief Complaint Chief Complaint  Patient presents with  . Suicidal    HPI Bryan Farrell is a 66 y.o. male.patient reports "I think I was suicidal earlier today but am not right now." He also complains of left knee pain suffer from a fall a few days ago.he had x-ray of left knee performed yesterday showing known fracture to the patella seen from prior study States he hasn't eaten today and is hungry. He denies any plan for suicide HPI  Past Medical History:  Diagnosis Date  . Diabetes mellitus without complication (HCC)   . Hypertension     Patient Active Problem List   Diagnosis Date Noted  . Moderate recurrent major depression (HCC)   . Adjustment disorder with depressed mood 04/13/2017  . Vitamin B12 deficiency 03/25/2017  . Vascular dementia 03/23/2017  . Suicidal ideation 03/23/2017  . Homelessness 03/23/2017  . Diabetes (HCC) 03/23/2017    Past Surgical History:  Procedure Laterality Date  . TONSILLECTOMY         Home Medications    Prior to Admission medications   Not on File    Family History History reviewed. No pertinent family history.  Social History Social History  Substance Use Topics  . Smoking status: Never Smoker  . Smokeless tobacco: Never Used  . Alcohol use No     Comment: BAC clear   No illicit drug use  Homeless for the past 5 years  Allergies   Patient has no known allergies.   Review of Systems Review of Systems  Musculoskeletal: Positive for arthralgias.       Left knee pain  Allergic/Immunologic: Positive for immunocompromised state.       Diabetic     Physical Exam Updated Vital Signs BP 101/69 (BP Location: Right Arm)   Pulse 88   Temp 98.1 F (36.7 C) (Oral)   Resp 15   Ht 6' (1.829 m)   Wt 95.3 kg (210 lb)   SpO2 98%   BMI 28.48 kg/m   Physical Exam    Constitutional:  unkempt  HENT:  Head: Normocephalic and atraumatic.  Eyes: Pupils are equal, round, and reactive to light. Conjunctivae are normal.  Neck: Neck supple. No tracheal deviation present. No thyromegaly present.  Cardiovascular: Normal rate and regular rhythm.   No murmur heard. Pulmonary/Chest: Effort normal and breath sounds normal.  Abdominal: Soft. Bowel sounds are normal. He exhibits no distension. There is no tenderness.  Musculoskeletal: Normal range of motion. He exhibits no edema or tenderness.  Left lower extremity no swelling no deformity tender overlying anterior knee. No ligamentous laxity. DP pulse 2+. All other extremities no contusion abrasion or tenderness neurovascularly intact  Neurological: He is alert. Coordination normal.  Walks with a limp favoring left lower extremity  Skin: Skin is warm and dry. No rash noted.  Psychiatric: He has a normal mood and affect.  Nursing note and vitals reviewed.    ED Treatments / Results  Labs (all labs ordered are listed, but only abnormal results are displayed) Labs Reviewed - No data to display  EKG  EKG Interpretation None     x-ray of left knee from yesterday viewed by me Results for orders placed or performed during the hospital encounter of 05/06/17  I-stat chem 8, ed  Result Value Ref Range   Sodium 139 135 -  145 mmol/L   Potassium 3.4 (L) 3.5 - 5.1 mmol/L   Chloride 99 (L) 101 - 111 mmol/L   BUN 15 6 - 20 mg/dL   Creatinine, Ser 1.61 0.61 - 1.24 mg/dL   Glucose, Bld 096 (H) 65 - 99 mg/dL   Calcium, Ion 0.45 4.09 - 1.40 mmol/L   TCO2 27 22 - 32 mmol/L   Hemoglobin 13.9 13.0 - 17.0 g/dL   HCT 81.1 91.4 - 78.2 %   Dg Knee 1-2 Views Left  Result Date: 05/03/2017 CLINICAL DATA:  Larey Seat yesterday with pain EXAM: LEFT KNEE - 1-2 VIEW COMPARISON:  Right knee films of 05/02/2017 FINDINGS: There is a moderate size left knee joint effusion present. No fracture is seen. Joint spaces are relatively normal  for age. IMPRESSION: Moderate-sized left knee joint effusion. Electronically Signed   By: Dwyane Dee M.D.   On: 05/03/2017 14:02   Ct Knee Left Wo Contrast  Result Date: 05/02/2017 CLINICAL DATA:  Fall. Patellar pain. Possible vertical fracture of the patella on conventional radiography. EXAM: CT OF THE Left KNEE WITHOUT CONTRAST TECHNIQUE: Multidetector CT imaging of the left knee was performed according to the standard protocol. Multiplanar CT image reconstructions were also generated. COMPARISON:  05/02/2017 radiographs FINDINGS: Bones/Joint/Cartilage CT confirms an acute longitudinal fracture of the left patella extending vertically the and including the lateral patellar facet. 2 mm step-off along the articular margin of the lateral patellar facet on image 99/5. No other acute fracture observed. Deformity from an old healed proximal fibular shaft fracture. Bony demineralization. Ligaments Suboptimally assessed by CT. Muscles and Tendons Unremarkable Soft tissues Moderate-sized lipohemarthrosis. Arterial atherosclerotic vascular calcification. IMPRESSION: 1. Mildly comminuted longitudinal fracture of the lateral patella. There is about 2 mm of step-off along the articular margin of the lateral patellar facet. 2. Moderate-sized lipohemarthrosis. 3. Atherosclerosis. Electronically Signed   By: Gaylyn Rong M.D.   On: 05/02/2017 14:49   Dg Knee Complete 4 Views Left  Result Date: 05/05/2017 CLINICAL DATA:  Fall yesterday with left knee pain. EXAM: LEFT KNEE - COMPLETE 4+ VIEW COMPARISON:  05/03/2017, 05/02/2017 and CT 05/02/2017. FINDINGS: Examination demonstrates evidence patient's acute vertical fracture through the lateral aspect of the patella without significant displacement unchanged. Small joint effusion. Remainder the exam is unchanged. IMPRESSION: Known acute vertical fracture through the lateral aspect of the patella without significant displacement and unchanged from recent prior studies  05/02/2017. Small joint effusion. Electronically Signed   By: Elberta Fortis M.D.   On: 05/05/2017 17:49   Dg Knee Complete 4 Views Left  Result Date: 05/02/2017 CLINICAL DATA:  Fall on sidewalk.  Left knee pain EXAM: LEFT KNEE - COMPLETE 4+ VIEW COMPARISON:  None. FINDINGS: Vertically oriented lucency laterally in the patella, concerning for fracture although bipartite patella can have a similar appearance. Correlate with tenderness over the lateral patella. Moderate to large knee effusion.  Patellar marginal spurring. Deformity in the proximal fibula shaft compatible with healed fracture. Vascular calcifications noted. Slight irregularity posterolaterally along the lateral tibial plateau is likely attributable to spurring. IMPRESSION: 1. Moderate to large knee effusion. 2. Vertically oriented lucency laterally in the patella. Appearance favors patellar fracture but a bipartite patella could potentially have a similar appearance, correlate with point tenderness over the lateral patella. 3. Healed posttraumatic deformity in the left proximal fibula shaft. Electronically Signed   By: Gaylyn Rong M.D.   On: 05/02/2017 12:57   Radiology Dg Knee Complete 4 Views Left  Result Date: 05/05/2017 CLINICAL DATA:  Fall yesterday with left knee pain. EXAM: LEFT KNEE - COMPLETE 4+ VIEW COMPARISON:  05/03/2017, 05/02/2017 and CT 05/02/2017. FINDINGS: Examination demonstrates evidence patient's acute vertical fracture through the lateral aspect of the patella without significant displacement unchanged. Small joint effusion. Remainder the exam is unchanged. IMPRESSION: Known acute vertical fracture through the lateral aspect of the patella without significant displacement and unchanged from recent prior studies 05/02/2017. Small joint effusion. Electronically Signed   By: Elberta Fortis M.D.   On: 05/05/2017 17:49    Procedures Procedures (including critical care time)  Medications Ordered in ED Medications -  No data to display   Initial Impression / Assessment and Plan / ED Course  I have reviewed the triage vital signs and the nursing notes.  Pertinent labs & imaging results that were available during my care of the patient were reviewed by me and considered in my medical decision making (see chart for details).     Patient given a meal here. Oral potassium supplement given. Tylenol given. 1:40 PM he feels ready for discharge. He'll be referred to orthopedics Dr. Magnus Ivan. Also referred to resource guide. Plan Tylenol for pain. He does have a knee immobilizer however he states more comfortable to walk without Final Clinical Impressions(s) / ED Diagnoses  Diagnosis #1 chronic suicidal ideation #2 left knee pain #3 hypokalemia Final diagnoses:  None    New Prescriptions New Prescriptions   No medications on file     Doug Sou, MD 05/06/17 1348    Doug Sou, MD 05/06/17 1349    Doug Sou, MD 05/06/17 1402

## 2017-05-06 NOTE — ED Notes (Addendum)
Pt becoming agitated because he feels he is waiting too long. Gave pt shelter resources and tightened knee brace per request. Did not get signature or final vitals due to pt agitation and arguing whenever we answer his questions.

## 2017-05-06 NOTE — ED Notes (Signed)
Bed: [facility] Expected date:  Expected time:  Means of arrival:  Comments: 

## 2017-05-06 NOTE — ED Notes (Signed)
ED Provider at bedside. 

## 2017-05-06 NOTE — ED Notes (Signed)
Gave pt sandwich and sprite

## 2017-05-06 NOTE — Discharge Instructions (Signed)
Take Tylenol as directed for pain. Call Dr. Magnus Ivan to schedule an appointment for your left knee pain. X-rays show that you have a broken left knee. Call any of the numbers on the resource guide. There is a list of homeless shelters and also a list of places that you can get psychiatric help. If you think he may harm yourself, call 911 immediately

## 2017-05-06 NOTE — ED Triage Notes (Addendum)
Pt reports he is not sure why he checked in. When the NT asked why he was here he told her he had a syncopal episode not SI. When I triaged him he said he is still unsure why he checked in. Pt has chronic suicidal ideations, but they are not as bad as they normally are. Denies syncopal episode. Pt is homeless and reports his children won't let him stay with them. Pt was seen for the same thing yesterday at [facility] ED.

## 2017-05-08 ENCOUNTER — Encounter (HOSPITAL_COMMUNITY): Payer: Self-pay | Admitting: Emergency Medicine

## 2017-05-08 ENCOUNTER — Emergency Department (HOSPITAL_COMMUNITY)
Admission: EM | Admit: 2017-05-08 | Discharge: 2017-05-08 | Disposition: A | Discharge status: Home / Self Care | Payer: Self-pay | Attending: Emergency Medicine | Admitting: Emergency Medicine

## 2017-05-08 DIAGNOSIS — M25562 Pain in left knee: Secondary | ICD-10-CM

## 2017-05-08 DIAGNOSIS — Z590 Homelessness: Secondary | ICD-10-CM | POA: Insufficient documentation

## 2017-05-08 DIAGNOSIS — E119 Type 2 diabetes mellitus without complications: Secondary | ICD-10-CM | POA: Insufficient documentation

## 2017-05-08 DIAGNOSIS — W1830XD Fall on same level, unspecified, subsequent encounter: Secondary | ICD-10-CM | POA: Insufficient documentation

## 2017-05-08 DIAGNOSIS — G8911 Acute pain due to trauma: Secondary | ICD-10-CM | POA: Insufficient documentation

## 2017-05-08 DIAGNOSIS — I10 Essential (primary) hypertension: Secondary | ICD-10-CM | POA: Insufficient documentation

## 2017-05-08 DIAGNOSIS — S82025D Nondisplaced longitudinal fracture of left patella, subsequent encounter for closed fracture with routine healing: Secondary | ICD-10-CM | POA: Insufficient documentation

## 2017-05-08 NOTE — ED Notes (Signed)
Bed: [facility]8 Expected date:  Expected time:  Means of arrival:  Comments: 

## 2017-05-08 NOTE — ED Triage Notes (Signed)
Patient c/o left knee pain for while from fall on sidewalk. Patient currently in knee immobilizer that he received from [facility] ED. Patient reports they "shoved me out the door before it was healed and hurt it again trying to walk home from there". Patient is worse with weight bearing and movement

## 2017-05-08 NOTE — ED Notes (Signed)
Pt noted to walk to room w/ mild difficulty.

## 2017-05-08 NOTE — Care Management Note (Signed)
Case Management Note  CM consulted for [facility] needs and medication needs.  Advised Street, [place] that CM has done all that can be done for pt at this time.  Spoke with pt who states his walker that was given to him on 9/19 was either stolen or he lost it.  CM advised that we were unable to get pt another walker.  Pt asked for CM to call his children.  Advised pt that his children were already aware he was in the area.  Per previous contact with children, they will not have anything to do with pt.  CM reminded pt to go to the [facility].  He asked how he was suppose to get there.  Advised him that [place] area and that the [facility] is in that location too.  Pt states he didn't know where he stayed last night.  CM asked pt if he had Terrence or Derrick's phone number from [facility] and they could help him. He said he didn't.  Pt asked that CM contact them for him.  Called and left a VM for Derrick. Primary RN advised pt was able to ambulate with ease with the knee immobilizer on.  Updated primary RN and Street, [place].  No further CM needs at this time.

## 2017-05-08 NOTE — ED Notes (Signed)
Pt was given a bus pass and print out of instructions with how to get to the [facility]

## 2017-05-08 NOTE — ED Provider Notes (Signed)
[facility] Provider Note   CSN: [id number] Arrival date & time: 05/08/17  0911     History   Chief Complaint Chief Complaint  Patient presents with  . Knee Pain    HPI Bryan Farrell is a 66 y.o. male with a PMHx of HTN, DM2, adjustment disorder, homelessness, and other conditions listed below, who presents to the ED with complaints of ongoing left knee pain since a mechanical fall on 05/02/17. Chart review reveals he was seen on 05/02/17 for fall from standing, had xray and then CT of L knee that confirmed longitudinal lateral patellar fx; ortho consulted that day, Dr. Linna Caprice advised knee immobilizer and outpatient f/up in 1wk. He was discharged with naprosyn, and case manager helped in getting the pt a walker and coordinated f/up with the [facility] in order to facilitate ortho f/up as well. He has subsequently returned to the ED another 3 times for c/o knee pain and suicidal ideations, has had two more xrays done (05/03/17 and 05/05/17, which just redemonstrated the fx and effusion), and has been cleared multiple times by psychiatric team, as well as ED staff. He states he has not followed up with the [facility], orthopedist, or anybody else because he "can't get there" due to the fact that he is homeless. He has not picked up any of his prescriptions or taken anything for his pain. He arrives here with no walker. He describes his pain is 9/10 constant sharp nonradiating left knee pain mostly over the anterior patella, worse with walking, and with no treatments tried prior to arrival. Associated symptoms include swelling. He denies any repeat falls since his last visit. He denies any bruising, numbness, tingling, focal weakness, fevers, chills, or any other complaints at this time.   The history is provided by the patient and medical records. No language interpreter was used.  Knee Pain   This is a recurrent problem. The current episode started more than 2 days ago. The problem occurs  constantly. The problem has not changed since onset.The pain is present in the left knee. The quality of the pain is described as sharp. The pain is at a severity of 9/10. The pain is moderate. Associated symptoms include limited range of motion (due to pain). Pertinent negatives include no numbness and no tingling. The symptoms are aggravated by standing and activity. He has tried nothing for the symptoms. The treatment provided no relief. There has been a history of trauma.    Past Medical History:  Diagnosis Date  . Diabetes mellitus without complication (HCC)   . Hypertension     Patient Active Problem List   Diagnosis Date Noted  . Moderate recurrent major depression (HCC)   . Adjustment disorder with depressed mood 04/13/2017  . Vitamin B12 deficiency 03/25/2017  . Vascular dementia 03/23/2017  . Suicidal ideation 03/23/2017  . Homelessness 03/23/2017  . Diabetes (HCC) 03/23/2017    Past Surgical History:  Procedure Laterality Date  . TONSILLECTOMY         Home Medications    Prior to Admission medications   Not on File    Family History No family history on file.  Social History Social History  Substance Use Topics  . Smoking status: Never Smoker  . Smokeless tobacco: Never Used  . Alcohol use No     Comment: BAC clear     Allergies   Patient has no known allergies.   Review of Systems Review of Systems  Constitutional: Negative for chills and fever.  Musculoskeletal: Positive for arthralgias and joint swelling.  Skin: Negative for color change.  Allergic/Immunologic: Positive for immunocompromised state (DM2).  Neurological: Negative for tingling, weakness and numbness.      Physical Exam Updated Vital Signs BP 115/84 (BP Location: Right Arm)   Pulse 92   Temp 97.9 F (36.6 C) (Oral)   Resp 19   SpO2 97%   Physical Exam  Constitutional: He is oriented to person, place, and time. Vital signs are normal. He appears well-developed and  well-nourished.  Non-toxic appearance. No distress.  Afebrile, nontoxic, NAD, disheveled appearance, angry demeanor  HENT:  Head: Normocephalic and atraumatic.  Mouth/Throat: Mucous membranes are normal.  Eyes: Conjunctivae and EOM are normal. Right eye exhibits no discharge. Left eye exhibits no discharge.  Neck: Normal range of motion. Neck supple.  Cardiovascular: Normal rate and intact distal pulses.   Pulmonary/Chest: Effort normal. No respiratory distress.  Abdominal: Normal appearance. He exhibits no distension.  Musculoskeletal:       Left knee: He exhibits decreased range of motion, swelling and effusion. He exhibits no ecchymosis, no deformity, no laceration, no erythema, normal alignment, no LCL laxity and no MCL laxity. Tenderness found. Patellar tendon tenderness noted.  L knee with immobilizer placed far down on the leg almost entirely on the lower leg and not much of the immobilizer in place over the knee. After removing knee immobilizer, ROM limited due to pain, no joint line TTP but mild patellar TTP over the center of the patella, and extending into the patellar tendon; +swelling/effusion, no deformity, no bruising or erythema, no warmth, no abnormal alignment, no varus/valgus laxity, neg anterior drawer test, no crepitus.  Distal strength and sensation grossly intact, distal pulses intact, compartments soft   Neurological: He is alert and oriented to person, place, and time. He has normal strength. No sensory deficit.  Skin: Skin is warm, dry and intact. No rash noted.  Psychiatric: He has a normal mood and affect.  Nursing note and vitals reviewed.    ED Treatments / Results  Labs (all labs ordered are listed, but only abnormal results are displayed) Labs Reviewed - No data to display  EKG  EKG Interpretation None       Radiology No results found.   L knee xray 05/02/17 Study Result: CLINICAL DATA:  Fall on sidewalk.  Left knee pain  EXAM: LEFT KNEE -  COMPLETE 4+ VIEW  COMPARISON:  None.  FINDINGS: Vertically oriented lucency laterally in the patella, concerning for fracture although bipartite patella can have a similar appearance. Correlate with tenderness over the lateral patella.  Moderate to large knee effusion.  Patellar marginal spurring.  Deformity in the proximal fibula shaft compatible with healed fracture.  Vascular calcifications noted. Slight irregularity posterolaterally along the lateral tibial plateau is likely attributable to spurring.  IMPRESSION: 1. Moderate to large knee effusion. 2. Vertically oriented lucency laterally in the patella. Appearance favors patellar fracture but a bipartite patella could potentially have a similar appearance, correlate with point tenderness over the lateral patella. 3. Healed posttraumatic deformity in the left proximal fibula shaft.   Electronically Signed   By: Gaylyn Rong M.D.   On: 05/02/2017 12:57    L knee CT 05/02/17 Study Result: CLINICAL DATA:  Fall. Patellar pain. Possible vertical fracture of the patella on conventional radiography.  EXAM: CT OF THE Left KNEE WITHOUT CONTRAST  TECHNIQUE: Multidetector CT imaging of the left knee was performed according to the standard protocol. Multiplanar CT image reconstructions were  also generated.  COMPARISON:  05/02/2017 radiographs  FINDINGS: Bones/Joint/Cartilage  CT confirms an acute longitudinal fracture of the left patella extending vertically the and including the lateral patellar facet. 2 mm step-off along the articular margin of the lateral patellar facet on image 99/5.  No other acute fracture observed. Deformity from an old healed proximal fibular shaft fracture. Bony demineralization.  Ligaments  Suboptimally assessed by CT.  Muscles and Tendons  Unremarkable  Soft tissues  Moderate-sized lipohemarthrosis.  Arterial atherosclerotic vascular  calcification.  IMPRESSION: 1. Mildly comminuted longitudinal fracture of the lateral patella. There is about 2 mm of step-off along the articular margin of the lateral patellar facet. 2. Moderate-sized lipohemarthrosis. 3. Atherosclerosis.   Electronically Signed   By: Gaylyn Rong M.D.   On: 05/02/2017 14:49      Procedures Procedures (including critical care time)  Medications Ordered in ED Medications - No data to display   Initial Impression / Assessment and Plan / ED Course  I have reviewed the triage vital signs and the nursing notes.  Pertinent labs & imaging results that were available during my care of the patient were reviewed by me and considered in my medical decision making (see chart for details).     65 y.o. male here with ongoing L knee pain after his prior fall from 05/02/17; has had 3 more visits since then for same complaints (as well as psych complaints) and had 2 repeat knee xrays. Was apparently given a walker the day it occurred, and advised to f/up with [facility] to facilitate the ortho f/up. He hasn't done any of it, and doesn't have the walker with him. On exam, moderate joint effusion, mild tenderness to patella and patellar tendon, NVI with soft compartments. Initially had knee immobilizer very far down his knee, which I repositioned. At this point, nothing medically needs to be done, his knee has been evaluated multiple times and he needs to f/up with his resources at the [facility] and then ortho; however he has been unwilling/unable to. Will reach out to case management to see what can be done  12:26 PM Marylene Land with case management stating that there really isn't much we can do for him without him being willing to file for medicare or follow up appropriately; she will inquire with him about where his walker is, and see if anything more can be done. Will await further information.   2:12 PM Marylene Land of case management informing me that there's really not  much else for [place] to do, ultimately it's up to him to follow the instructions we've given him; she's reached out to [facility] to have them assist [place] as well; pt able to ambulate without difficulty using immobilizer, doesn't know where his walker is but Marylene Land states we can't get him another one. Doubt crutches would be a good idea. Advised RICE, ice bag given and discussed getting ice at a gas station which would be free; advised use of tylenol/motrin (pt refused mobic), and f/up with the [facility] to establish care, and with ortho for ongoing management of his knee injury. Discussed case with my attending Dr. Jeraldine Loots who agrees with plan. I explained the diagnosis and have given explicit precautions to return to the ER including for any other new or worsening symptoms. The patient understands and accepts the medical plan as it's been dictated and I have answered their questions. Discharge instructions concerning home care and prescriptions have been given. The patient is STABLE and is discharged to home in good  condition.    Final Clinical Impressions(s) / ED Diagnoses   Final diagnoses:  Acute pain of left knee  Closed nondisplaced longitudinal fracture of left patella with routine healing, subsequent encounter    New Prescriptions New Prescriptions   No medications on [address] [place], [place] 05/08/17 1418    Gerhard Munch, MD 05/08/17 1701

## 2017-05-08 NOTE — Discharge Instructions (Signed)
You must try to follow up with the [facility] Bryan Farrell) in order to establish medical care so they can help you get followed up with the orthopedist, and help get you resources to feel better. Keep the immobilizer on at all times. Use ice and elevate your knee to help with pain and swelling. Use tylenol and motrin to help with pain. Follow up with the [facility] today, and with the orthopedist in the next 3-5 days for ongoing management of your knee injury. Return to the ER for emergent changes or worsening symptoms.

## 2017-05-08 NOTE — ED Notes (Signed)
Pt ambulated without assistance to restroom. Pt was asked if he needed a bus pass for discharge. Pt stated "I do not care". Pt was instructed to notify staff if his needs change

## 2017-05-11 ENCOUNTER — Encounter (HOSPITAL_COMMUNITY): Payer: Self-pay

## 2017-05-11 ENCOUNTER — Emergency Department (HOSPITAL_COMMUNITY): Payer: Self-pay

## 2017-05-11 ENCOUNTER — Emergency Department (HOSPITAL_COMMUNITY)
Admission: EM | Admit: 2017-05-11 | Discharge: 2017-05-11 | Disposition: A | Discharge status: Home / Self Care | Payer: Self-pay | Attending: Emergency Medicine | Admitting: Emergency Medicine

## 2017-05-11 DIAGNOSIS — Y999 Unspecified external cause status: Secondary | ICD-10-CM | POA: Insufficient documentation

## 2017-05-11 DIAGNOSIS — S52591A Other fractures of lower end of right radius, initial encounter for closed fracture: Secondary | ICD-10-CM | POA: Insufficient documentation

## 2017-05-11 DIAGNOSIS — Y939 Activity, unspecified: Secondary | ICD-10-CM | POA: Insufficient documentation

## 2017-05-11 DIAGNOSIS — E119 Type 2 diabetes mellitus without complications: Secondary | ICD-10-CM | POA: Insufficient documentation

## 2017-05-11 DIAGNOSIS — Y929 Unspecified place or not applicable: Secondary | ICD-10-CM | POA: Insufficient documentation

## 2017-05-11 DIAGNOSIS — W19XXXA Unspecified fall, initial encounter: Secondary | ICD-10-CM | POA: Insufficient documentation

## 2017-05-11 DIAGNOSIS — G5631 Lesion of radial nerve, right upper limb: Secondary | ICD-10-CM | POA: Insufficient documentation

## 2017-05-11 DIAGNOSIS — R2689 Other abnormalities of gait and mobility: Secondary | ICD-10-CM | POA: Insufficient documentation

## 2017-05-11 DIAGNOSIS — Z590 Homelessness: Secondary | ICD-10-CM | POA: Insufficient documentation

## 2017-05-11 DIAGNOSIS — F0150 Vascular dementia without behavioral disturbance: Secondary | ICD-10-CM | POA: Insufficient documentation

## 2017-05-11 DIAGNOSIS — I10 Essential (primary) hypertension: Secondary | ICD-10-CM | POA: Insufficient documentation

## 2017-05-11 NOTE — Progress Notes (Addendum)
CSW reviewed ED chart and noted pt's return to the [facility] ED.  PT is well-known to the [facility] ED, as well as the [facility] ED.  CSW consulted with the CM who estimated pt has had approx 17 ED visits and on a fairly consistent basis since 03/17/17.  Previous to 03/17/17 visited the ED only sporadically from 2012 to 2015.  CSW's note from 03/31/17: This pt is known to this CSW due to his recent consult on the pt from the pt's last visit to the [facility] ED on 8/17.     CSW spoke to the pt by phone and stated he had not yet stayed at the [facility] although notes stated he had reported being kicked out. Several times during the conversation when asked about the previous few day pt had replied, "I don't know", but when confronted with information from the notes regarding pt's stay at [place] and the fact his ex-wife is offering to help the pt financially, the pt had immediately replied as if he knew this information and stated, he "was not comfortable at this time", with letting his ex-wife help him out financially.   When asked about his Lucianne Lei the pt had immediately replied "I don't know if it is still there, or not"  Per notes pt has been diagnosed with vascular dementia, as evidenced by a CT scan.   CSW encouraged pt to allow his ex-wife to assist him, but pt again refused and CSW offered pt a shelter list.  Pt accepted the list via phone and faxed said list to pt's RN to be provided to the pt.  10:00 AM   CSW updated EDP, CSW will speak to pt and offer shelter list.  10:52 AM  CSW met with pt and repeated CSW's earlier conversation with the pt on 8/17 and reiterated that the CSW was willing to assist the pt in contacting the pt's ex-wife who is willing to assist the pt financially, per the notes.  CSW informed pt pt's only alternative at this time was to be provided with a shelter list.  Pt presented as if he were attempting to make up his mind to let the CSW assist in calling his ex-wife  and after several minutes the CSW counseld the pt in the efficacy of accepting help, affirmed the pt had the right to self-determination, and then encourafed the pt again to allow the CSW to assist the pt.  Pt refused to allow the CSW to contact his ex-wife.    CSW then provided pt with a shelter list with directions on how to request help from the bus driver on reaching the shelter of his choice.   CSW again offered to reach out to the pt's ex-wife with the pt's permission and the pt refused.  CSW counseled pt that if a care plan was put into place at [facility] he would be well-known to [place] as arriving to the E.D. In order to be housed, unless there was a treatable medical condition and for the pt to reasonably expect to be "lodged" at the hospital for a lack of a "place to go".  Pt indicated he understood and CSW again offered to assist the pt and the pt did not give verbal permission.  Please reconsult if future social work needs arise.   Pt has had approx 27 ED stays in 2018.   Alphonse Guild. Lonnie Reth, LCSW, LCAS, CSI Clinical Social Worker Ph: [phone]

## 2017-05-11 NOTE — Care Management Note (Signed)
Case Management Note  Received a return call from Bryan Farrell with [facility] at the [facility] from Bryan Farrell's previous visit; the [facility] team had been at a conference this week.  CM looked up Bryan Farrell's chart to see if Bryan Farrell had returned since last contact or if he was currently here and noted he'd just checked back in a little over an hour ago.  Bryan Farrell advised that he would coordinate with Felipa Eth to come see Bryan Farrell.  CM will follow as needed.

## 2017-05-11 NOTE — Progress Notes (Signed)
Orthopedic Tech Progress Note Patient Details:  Bryan Farrell [birth year] [id number]  Applied short arm volar splint to Rt wrist. Pt also was seen from a previous ED visit and received a knee immobilizer, readjusted knee immobilizer for Pt. Ortho Devices Type of Ortho Device: Ace wrap, Volar splint Ortho Device/Splint Location: Well padded fiberglass volar splint applied to Rt wrist. Ortho Device/Splint Interventions: Application   Clois Dupes 05/11/2017, 9:44 PM

## 2017-05-11 NOTE — Discharge Instructions (Addendum)
Wear wrist splint at all times until you see the hand specialist. Ice and elevate wrist throughout the day, using ice pack for no more than 20 minutes every hour.  Alternate between tylenol and motrin for pain relief. Follow up with the wrist specialist in 1 week for recheck of symptoms and ongoing management of your fracture and injury. Follow up with the [facility]) as soon as possible in order to have ongoing medical care and to help facilitate your orthopedic and medical care. Return to the ER for changes or worsening symptoms.

## 2017-05-11 NOTE — ED Notes (Signed)
Bed: [facility] Expected date:  Expected time:  Means of arrival:  Comments: Hold for [place].

## 2017-05-11 NOTE — ED Provider Notes (Signed)
[facility]-EMERGENCY DEPT Provider Note   CSN: [id number] Arrival date & time: 05/11/17  8295     History   Chief Complaint Chief Complaint  Patient presents with  . Fall  . Wrist Pain  . balance issues    HPI Bryan Farrell is a 66 y.o. male with a PMHx of homelessness, vascular dementia, depression, adjustment disorder, DM2, and HTN, who presents to the ED with complaints of mechanical fall that occurred sometime this morning causing him to fall onto his right wrist which was bent into a hyperflexed positioning. He states that since then he has had difficulty extending his wrist and thumb stating that it feels weak. He states that prior to the fall, he had absolutely no weakness in the RUE or anywhere else. He also complains of 9/10 constant "stiff" type pain in the right wrist, nonradiating, worse with attempting to move the wrist, and with no treatments tried prior to arrival. Associated symptoms include mild swelling. He denies hitting his head, LOC, numbness, tingling, bruising, no abrasions, recent fevers or chills, CP, SOB, abd pain, N/V/D/C, hematuria, dysuria, myalgias, neck/back pain, shoulder/upper arm pain, or any other complaints at this time. He immediately asks whether this would require him to "stay here" and be admitted, because he would prefer to be admitted into the hospital since he is currently homeless.  Of note, pt has been seen multiple times in the ED, including on 05/02/17 for fall when he was found to have a patellar fx; was placed in knee immobilizer and given a walker, which he later lost/misplaced. His last ED visit was on 05/08/17, at which time the case manager here saw him and again urged him to f/up with the [facility] to establish care in order to help facilitate him getting seen by ortho for his knee injury, and coordinate his care better. They also reached out to the [facility] providers to help him as well. Pt has been unwilling to follow instructions given to him by case  management here.    The history is provided by the patient and medical records. No language interpreter was used.  Fall  Pertinent negatives include no chest pain, no abdominal pain and no shortness of breath.  Wrist Pain  This is a new problem. The current episode started 6 to 12 hours ago. The problem occurs constantly. The problem has not changed since onset.Pertinent negatives include no chest pain, no abdominal pain and no shortness of breath. Exacerbated by: attempting to move wrist. Nothing relieves the symptoms. He has tried nothing for the symptoms. The treatment provided no relief.    Past Medical History:  Diagnosis Date  . Diabetes mellitus without complication (HCC)   . Hypertension     Patient Active Problem List   Diagnosis Date Noted  . Moderate recurrent major depression (HCC)   . Adjustment disorder with depressed mood 04/13/2017  . Vitamin B12 deficiency 03/25/2017  . Vascular dementia 03/23/2017  . Suicidal ideation 03/23/2017  . Homelessness 03/23/2017  . Diabetes (HCC) 03/23/2017    Past Surgical History:  Procedure Laterality Date  . TONSILLECTOMY         Home Medications    Prior to Admission medications   Not on File    Family History Family History  Problem Relation Age of Onset  . Family history unknown: Yes    Social History Social History  Substance Use Topics  . Smoking status: Never Smoker  . Smokeless tobacco: Never Used  . Alcohol use No  Comment: BAC clear     Allergies   Patient has no known allergies.   Review of Systems Review of Systems  Constitutional: Negative for chills and fever.  HENT: Negative for facial swelling (no head inj).   Respiratory: Negative for shortness of breath.   Cardiovascular: Negative for chest pain.  Gastrointestinal: Negative for abdominal pain, constipation, diarrhea, nausea and vomiting.  Genitourinary: Negative for dysuria and hematuria.  Musculoskeletal: Positive for arthralgias  and joint swelling. Negative for myalgias and neck pain.  Skin: Negative for color change and wound.  Allergic/Immunologic: Positive for immunocompromised state (DM2).  Neurological: Positive for weakness (R wrist). Negative for syncope and numbness.  Psychiatric/Behavioral: Negative for confusion.     Physical Exam Updated Vital Signs BP 138/88 (BP Location: Right Arm)   Pulse 74   Temp 98.6 F (37 C) (Oral)   Resp 18   Ht 6' (1.829 m)   Wt 95.3 kg (210 lb)   SpO2 100%   BMI 28.48 kg/m   Physical Exam  Constitutional: He is oriented to person, place, and time. Vital signs are normal. He appears well-developed and well-nourished.  Non-toxic appearance. No distress.  Afebrile, nontoxic, NAD, disheveled appearance, immediately requesting admission  HENT:  Head: Normocephalic and atraumatic.  Mouth/Throat: Mucous membranes are normal.  Eyes: Conjunctivae and EOM are normal. Right eye exhibits no discharge. Left eye exhibits no discharge.  Neck: Normal range of motion. Neck supple.  Cardiovascular: Normal rate and intact distal pulses.   Pulmonary/Chest: Effort normal. No respiratory distress.  Abdominal: Normal appearance. He exhibits no distension.  Musculoskeletal:       Right wrist: He exhibits decreased range of motion, tenderness, bony tenderness and swelling. He exhibits no effusion, no crepitus, no deformity and no laceration.  R wrist with limited wrist extension (hard to say if pt intentionally doing this, or if truly unable; at times he seems to hold his wrist in neutral position but other times appears to have wrist drop), also limited extension of thumb and MCP joints of the fingers. Full flexion of all digits and of wrist. FROM intact at elbow and shoulder. Mild TTP to distal radius, but no other areas of tenderness to hand, forearm, elbow, humerus, or shoulder. C-spine without any focal bony TTP or deformities/step offs. No crepitus or deformities to any area of the RUE.  Mild swelling at the distal radius/wrist, no bruising. Triceps and biceps strength intact, supination/pronation intact, and grip strength preserved. Strength with wrist/MCP/thumb extension diminished. Sensation grossly intact. Distal pulses intact. Skin intact over wrist/RUE, small old abrasion to palm of R hand. No fresh abrasions/lacerations. L knee with immobilizer placed far down on the leg almost entirely on the lower leg. Repositioned without difficulty  Neurological: He is alert and oriented to person, place, and time. He has normal strength. No sensory deficit.  Skin: Skin is warm, dry and intact. No rash noted.  Psychiatric: He has a normal mood and affect.  Nursing note and vitals reviewed.    ED Treatments / Results  Labs (all labs ordered are listed, but only abnormal results are displayed) Labs Reviewed - No data to display  EKG  EKG Interpretation None       Radiology Dg Wrist Complete Right  Result Date: 05/11/2017 CLINICAL DATA:  Right wrist pain after fall today. EXAM: RIGHT WRIST - COMPLETE 3+ VIEW COMPARISON:  Radiographs of March 18, 2017. FINDINGS: There is interval development of linear density in the distal right radius concerning for healing  subacute fracture. Vascular calcifications are noted. Joint spaces are intact. No other abnormality is noted. IMPRESSION: Healing nondisplaced subacute fracture of distal right radius is noted. Electronically Signed   By: Lupita Raider, M.D.   On: 05/11/2017 19:26   Dg Humerus Right  Result Date: 05/11/2017 CLINICAL DATA:  Right arm pain after fall today. EXAM: RIGHT HUMERUS - 2+ VIEW COMPARISON:  None. FINDINGS: There is no evidence of fracture or other focal bone lesions. Soft tissues are unremarkable. IMPRESSION: Normal right humerus. Electronically Signed   By: Lupita Raider, M.D.   On: 05/11/2017 19:28    Procedures Procedures (including critical care time)   SPLINT APPLICATION Date/Time: 9:23 PM Authorized  by: Rhona Raider Consent: Verbal consent obtained. 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 Patient tolerance: Patient tolerated the procedure well with no immediate complications.     Medications Ordered in ED Medications - No data to display   Initial Impression / Assessment and Plan / ED Course  I have reviewed the triage vital signs and the nursing notes.  Pertinent labs & imaging results that were available during my care of the patient were reviewed by me and considered in my medical decision making (see chart for details).     66 y.o. male here with R wrist pain and difficulty extending due to mechanical fall this morning. On exam, pt unable (or possibly unwilling) to extend wrist or thumb or at MCP joints, mild tenderness and swelling to distal radius, no other areas of tenderness, otherwise NVI with soft compartments; hard to say if pt is intentionally demonstrating these diminished ROMs vs if he truly has radial nerve palsy; triceps movements intact and grip strength fairly well preserved. He immediately asks to "stay here" and be admitted for this, so he could be malingering, but very hard to distinguish just based on exam. Will get xray of wrist and humerus to eval for injury. Will reassess shortly  9:20 PM Xray wrist showing subacute nondisplaced fx of distal radius which is healing; Xray of humerus negative. This wrist fx likely happened during his fall on 05/02/17 when he fx'd his patella, but hard to say exactly when it may have happened; his last wrist xray was in Aug 2018 so it at least happened since then. Unclear why he would all of a sudden have a radial nerve palsy, or if this  is genuine or pt malingering; however, given that he's otherwise NVI, will splint wrist and have him f/up with hand specialist. Yet again long discussion had regarding importance of follow up. Discussed case with my attending Dr. Eudelia Bunch who agrees with plan. Advised RICE/tylenol/motrin for pain. Splint care advised, discussed use of this until he sees the specialist. I explained the diagnosis and have given explicit precautions to return to the ER including for any other new or worsening symptoms. The patient understands and accepts the medical plan as it's been dictated and I have answered their questions. Discharge instructions concerning home care and prescriptions have been given. The patient is STABLE and is discharged to home in good condition.    Final Clinical Impressions(s) / ED Diagnoses   Final diagnoses:  Other closed fracture of distal end of right radius, initial encounter  Neuropathy of right radial nerve  New Prescriptions New Prescriptions   No medications on [address] [place], [place] 05/11/17 2126    Nira Conn, MD 05/11/17 [phone]

## 2017-05-11 NOTE — ED Triage Notes (Signed)
Per EMS- Patient is homeless. Patient c/o having balance issues x 1 week and states he fell today. Patient c/o right wrist pain. No deformities, +radial pulse, no swelling noted

## 2017-05-12 ENCOUNTER — Encounter (HOSPITAL_COMMUNITY): Payer: Self-pay | Admitting: Emergency Medicine

## 2017-05-12 ENCOUNTER — Emergency Department (HOSPITAL_COMMUNITY)
Admission: EM | Admit: 2017-05-12 | Discharge: 2017-05-12 | Disposition: A | Discharge status: Home / Self Care | Payer: Self-pay | Attending: Emergency Medicine | Admitting: Emergency Medicine

## 2017-05-12 DIAGNOSIS — R45851 Suicidal ideations: Secondary | ICD-10-CM | POA: Insufficient documentation

## 2017-05-12 DIAGNOSIS — Z5900 Homelessness unspecified: Secondary | ICD-10-CM

## 2017-05-12 DIAGNOSIS — E119 Type 2 diabetes mellitus without complications: Secondary | ICD-10-CM | POA: Insufficient documentation

## 2017-05-12 DIAGNOSIS — F329 Major depressive disorder, single episode, unspecified: Secondary | ICD-10-CM | POA: Insufficient documentation

## 2017-05-12 DIAGNOSIS — I10 Essential (primary) hypertension: Secondary | ICD-10-CM | POA: Insufficient documentation

## 2017-05-12 NOTE — ED Notes (Signed)
Pt ambulated out of the ED in no distress. Pt upset that he cannot stay in a room over night.

## 2017-05-12 NOTE — ED Notes (Signed)
Patient was stuck twice and started yelling as writer was sticking patient and then jerked arm away. Only dark green tube was collected. Notified RN in triage.

## 2017-05-12 NOTE — ED Notes (Signed)
He was offered a bus pass and stated, "I think I just need a new doctor to rip all this shit off."

## 2017-05-12 NOTE — ED Triage Notes (Signed)
Pt states he was suicidal when he got here, no plan, now "[place] suicidal " due to having to wait so long. When asked what made him feel this way "waiting do long" states his kids would not bring him in when he said he was suicidal which made him made.

## 2017-05-12 NOTE — ED Provider Notes (Signed)
[facility]-EMERGENCY DEPT Provider Note   CSN: [id number] Arrival date & time: 05/12/17  1644     History   Chief Complaint Chief Complaint  Patient presents with  . Suicidal    HPI Bryan Farrell is a 66 y.o. male.  66 yo M with a chief complaint of suicidal ideation. The patient actually said that he said that because he had not come back to the room he had. Felt that he had been waiting too long. He is so angry that he is having trouble remembering why he actually showed up. He told me that he thinks it's for follow-up. He denies any current symptoms. Denies any plan for suicidal ideation. Is mostly worried about where he can stay this evening.   The history is provided by the patient.  Illness  This is a new problem. The current episode started yesterday. The problem occurs constantly. The problem has not changed since onset.Pertinent negatives include no chest pain, no abdominal pain, no headaches and no shortness of breath. Nothing aggravates the symptoms. Nothing relieves the symptoms. He has tried nothing for the symptoms. The treatment provided no relief.    Past Medical History:  Diagnosis Date  . Diabetes mellitus without complication (HCC)   . Hypertension     Patient Active Problem List   Diagnosis Date Noted  . Moderate recurrent major depression (HCC)   . Adjustment disorder with depressed mood 04/13/2017  . Vitamin B12 deficiency 03/25/2017  . Vascular dementia 03/23/2017  . Suicidal ideation 03/23/2017  . Homelessness 03/23/2017  . Diabetes (HCC) 03/23/2017    Past Surgical History:  Procedure Laterality Date  . TONSILLECTOMY         Home Medications    Prior to Admission medications   Not on File    Family History Family History  Problem Relation Age of Onset  . Family history unknown: Yes    Social History Social History  Substance Use Topics  . Smoking status: Never Smoker  . Smokeless tobacco: Never Used  . Alcohol use No   Comment: BAC clear     Allergies   Patient has no known allergies.   Review of Systems Review of Systems  Constitutional: Negative for chills and fever.  HENT: Negative for congestion and facial swelling.   Eyes: Negative for discharge and visual disturbance.  Respiratory: Negative for shortness of breath.   Cardiovascular: Negative for chest pain and palpitations.  Gastrointestinal: Negative for abdominal pain, diarrhea and vomiting.  Musculoskeletal: Negative for arthralgias and myalgias.  Skin: Negative for color change and rash.  Neurological: Negative for tremors, syncope and headaches.  Psychiatric/Behavioral: Positive for suicidal ideas. Negative for confusion and dysphoric mood.     Physical Exam Updated Vital Signs BP 100/74 (BP Location: Left Arm)   Pulse 94   Temp 98.6 F (37 C) (Oral)   Resp 18   SpO2 99%   Physical Exam  Constitutional: He is oriented to person, place, and time. He appears well-developed and well-nourished.  HENT:  Head: Normocephalic and atraumatic.  Eyes: Pupils are equal, round, and reactive to light. EOM are normal.  Neck: Normal range of motion. Neck supple. No JVD present.  Cardiovascular: Normal rate and regular rhythm.  Exam reveals no gallop and no friction rub.   No murmur heard. Pulmonary/Chest: No respiratory distress. He has no wheezes.  Abdominal: He exhibits no distension and no mass. There is no tenderness. There is no rebound and no guarding.  Musculoskeletal: Normal range of  motion.  Right-sided short wrist splint. Intact Refilled to expose digits. Full range of motion. No noted edema. Left knee brace in place. No noted edema.  Neurological: He is alert and oriented to person, place, and time.  Skin: No rash noted. No pallor.  Psychiatric: His behavior is normal. His affect is angry.  Nursing note and vitals reviewed.    ED Treatments / Results  Labs (all labs ordered are listed, but only abnormal results are  displayed) Labs Reviewed - No data to display  EKG  EKG Interpretation None       Radiology Dg Wrist Complete Right  Result Date: 05/11/2017 CLINICAL DATA:  Right wrist pain after fall today. EXAM: RIGHT WRIST - COMPLETE 3+ VIEW COMPARISON:  Radiographs of March 18, 2017. FINDINGS: There is interval development of linear density in the distal right radius concerning for healing subacute fracture. Vascular calcifications are noted. Joint spaces are intact. No other abnormality is noted. IMPRESSION: Healing nondisplaced subacute fracture of distal right radius is noted. Electronically Signed   By: Lupita Raider, M.D.   On: 05/11/2017 19:26   Dg Humerus Right  Result Date: 05/11/2017 CLINICAL DATA:  Right arm pain after fall today. EXAM: RIGHT HUMERUS - 2+ VIEW COMPARISON:  None. FINDINGS: There is no evidence of fracture or other focal bone lesions. Soft tissues are unremarkable. IMPRESSION: Normal right humerus. Electronically Signed   By: Lupita Raider, M.D.   On: 05/11/2017 19:28    Procedures Procedures (including critical care time)  Medications Ordered in ED Medications - No data to display   Initial Impression / Assessment and Plan / ED Course  I have reviewed the triage vital signs and the nursing notes.  Pertinent labs & imaging results that were available during my care of the patient were reviewed by me and considered in my medical decision making (see chart for details).     66 yo M with a cc of Suicidal ideation. Patient is actually unsure why he is here. He said that he told the nurse that he was suicidal so that he could come back faster. He is worried that is not going to have a place to stay because he is homeless. Given a list of shelters. Discharge home.  6:36 PM:  I have discussed the diagnosis/risks/treatment options with the patient and believe the pt to be eligible for discharge home to follow-up with PCP. We also discussed returning to the ED immediately  if new or worsening sx occur. We discussed the sx which are most concerning (e.g., sudden worsening pain, fever, inability to tolerate by mouth) that necessitate immediate return. Medications administered to the patient during their visit and any new prescriptions provided to the patient are listed below.  Medications given during this visit Medications - No data to display   The patient appears reasonably screen and/or stabilized for discharge and I doubt any other medical condition or other [facility] requiring further screening, evaluation, or treatment in the ED at this time prior to discharge.    Final Clinical Impressions(s) / ED Diagnoses   Final diagnoses:  Homelessness    New Prescriptions New Prescriptions   No medications on file     Melene Plan, DO 05/12/17 1836

## 2017-05-13 ENCOUNTER — Encounter (HOSPITAL_COMMUNITY): Payer: Self-pay | Admitting: Emergency Medicine

## 2017-05-13 ENCOUNTER — Emergency Department (HOSPITAL_COMMUNITY)
Admission: EM | Admit: 2017-05-13 | Discharge: 2017-05-13 | Disposition: A | Discharge status: Home / Self Care | Payer: Self-pay | Attending: Emergency Medicine | Admitting: Emergency Medicine

## 2017-05-13 DIAGNOSIS — E119 Type 2 diabetes mellitus without complications: Secondary | ICD-10-CM | POA: Insufficient documentation

## 2017-05-13 DIAGNOSIS — R110 Nausea: Secondary | ICD-10-CM | POA: Insufficient documentation

## 2017-05-13 DIAGNOSIS — I10 Essential (primary) hypertension: Secondary | ICD-10-CM | POA: Insufficient documentation

## 2017-05-13 MED ORDER — ONDANSETRON 4 MG PO TBDP
4.0000 mg | ORAL_TABLET | Freq: Once | ORAL | Status: AC
  Administered 2017-05-13: 4 mg via ORAL
  Filled 2017-05-13: qty 1

## 2017-05-13 NOTE — ED Triage Notes (Addendum)
Per EMS, patient c/o N/V today. Denies pain. Ambulatory.   Patient has no complaints at this time. Reports nausea has resolved since transport.  BP 156/90 HR 92 RR 16

## 2017-05-13 NOTE — ED Notes (Signed)
Bed: WTR9 Expected date:  Expected time:  Means of arrival:  Comments: 

## 2017-05-13 NOTE — ED Provider Notes (Signed)
[facility]-EMER[facility]ENCY DEPT Provider Note   CSN: [id number] Arrival date & time: 05/13/17  1944     History   Chief Complaint Chief Complaint  Patient presents with  . Nausea    HPI Bryan Farrell is a 66 y.o. male who presents to the ED via EMS with nausea. The patient reports to RN that since arrival the nausea has resolved.  Patient reports to me that he is not sure why he is here but since he has a bag to vomit in he must have had nausea. He states he is not sure who called EMS. Patient reports that a lot of times he can't remember things. Patient denies any problems at this time.   Patient was here yesterday and reported at that time that he did not know why he was here.   HPI  Past Medical History:  Diagnosis Date  . Diabetes mellitus without complication (HCC)   . Hypertension     Patient Active Problem List   Diagnosis Date Noted  . Moderate recurrent major depression (HCC)   . Adjustment disorder with depressed mood 04/13/2017  . Vitamin B12 deficiency 03/25/2017  . Vascular dementia 03/23/2017  . Suicidal ideation 03/23/2017  . Homelessness 03/23/2017  . Diabetes (HCC) 03/23/2017    Past Surgical History:  Procedure Laterality Date  . TONSILLECTOMY         Home Medications    Prior to Admission medications   Not on File    Family History Family History  Problem Relation Age of Onset  . Family history unknown: Yes    Social History Social History  Substance Use Topics  . Smoking status: Never Smoker  . Smokeless tobacco: Never Used  . Alcohol use No     Comment: BAC clear     Allergies   Patient has no known allergies.   Review of Systems Review of Systems  Constitutional: Negative for diaphoresis.  Respiratory: Negative for shortness of breath.   Cardiovascular: Negative for chest pain.  Gastrointestinal: Negative for abdominal pain and vomiting. Nausea: that resolved.  Musculoskeletal: Negative for neck pain.     Physical  Exam Updated Vital Signs BP (!) 126/91 (BP Location: Left Arm)   Pulse 84   Temp 98.8 F (37.1 C) (Oral)   Resp 18   SpO2 98%   Physical Exam  Constitutional: He is oriented to person, place, and time. He appears well-developed and well-nourished. No distress.  HENT:  Head: Normocephalic and atraumatic.  Eyes: EOM are normal.  Neck: Neck supple.  Cardiovascular: Normal rate and regular rhythm.   Pulmonary/Chest: Effort normal and breath sounds normal.  Abdominal: Soft. Bowel sounds are normal. There is no tenderness.  Musculoskeletal: Normal range of motion.  Neurological: He is alert and oriented to person, place, and time. No cranial nerve deficit.  Skin: Skin is warm and dry.  Psychiatric: He has a normal mood and affect.  Nursing note and vitals reviewed.    ED Treatments / Results  Labs (all labs ordered are listed, but only abnormal results are displayed) Labs Reviewed - No data to display  Radiology No results found.  Procedures Procedures (including critical care time)  Medications Ordered in ED Medications  ondansetron (ZOFRAN-ODT) disintegrating tablet 4 mg (4 mg Oral Given 05/13/17 2204)     Initial Impression / Assessment and Plan / ED Course  I have reviewed the triage vital signs and the nursing notes.  66 y.o. male with hx of nausea that resolved on  arrival to the ED stable for d/c without other complaints. I discussed with the patient that since he no longer has nausea and no other symptoms that he can be d/c home. Patient states that he does not have a home and no where to go but here. RN will try to find appropriate d/c for the patient.   Final Clinical Impressions(s) / ED Diagnoses   Final diagnoses:  Nausea    New Prescriptions New Prescriptions   No medications on file     Janne Napoleon, NP 05/13/17 2226    Derwood Kaplan, MD 05/14/17 [phone]

## 2017-05-13 NOTE — ED Notes (Signed)
Pt states he is unsure why he is here.

## 2017-05-19 ENCOUNTER — Encounter (HOSPITAL_COMMUNITY): Payer: Self-pay | Admitting: Emergency Medicine

## 2017-05-19 ENCOUNTER — Emergency Department (HOSPITAL_COMMUNITY): Payer: Self-pay

## 2017-05-19 ENCOUNTER — Emergency Department (HOSPITAL_COMMUNITY)
Admission: EM | Admit: 2017-05-19 | Discharge: 2017-05-20 | Disposition: A | Discharge status: Home / Self Care | Payer: Self-pay | Attending: Emergency Medicine | Admitting: Emergency Medicine

## 2017-05-19 DIAGNOSIS — E876 Hypokalemia: Secondary | ICD-10-CM | POA: Insufficient documentation

## 2017-05-19 DIAGNOSIS — R410 Disorientation, unspecified: Secondary | ICD-10-CM | POA: Insufficient documentation

## 2017-05-19 DIAGNOSIS — F4321 Adjustment disorder with depressed mood: Secondary | ICD-10-CM | POA: Insufficient documentation

## 2017-05-19 DIAGNOSIS — E119 Type 2 diabetes mellitus without complications: Secondary | ICD-10-CM | POA: Insufficient documentation

## 2017-05-19 DIAGNOSIS — I10 Essential (primary) hypertension: Secondary | ICD-10-CM | POA: Insufficient documentation

## 2017-05-19 LAB — COMPREHENSIVE METABOLIC PANEL
ALBUMIN: 3.9 g/dL (ref 3.5–5.0)
ALK PHOS: 69 U/L (ref 38–126)
ALT: 16 U/L — ABNORMAL LOW (ref 17–63)
AST: 21 U/L (ref 15–41)
Anion gap: 10 (ref 5–15)
BILIRUBIN TOTAL: 1 mg/dL (ref 0.3–1.2)
BUN: 13 mg/dL (ref 6–20)
CALCIUM: 9.3 mg/dL (ref 8.9–10.3)
CO2: 24 mmol/L (ref 22–32)
Chloride: 106 mmol/L (ref 101–111)
Creatinine, Ser: 0.81 mg/dL (ref 0.61–1.24)
GFR calc Af Amer: >60 mL/min (ref >60)
GLUCOSE: 164 mg/dL — AB (ref 65–99)
POTASSIUM: 2.5 mmol/L — AB (ref 3.5–5.1)
Sodium: 140 mmol/L (ref 135–145)
TOTAL PROTEIN: 6.8 g/dL (ref 6.5–8.1)

## 2017-05-19 LAB — CBC WITH DIFFERENTIAL/PLATELET
BASOS ABS: 0.1 10*3/uL (ref 0.0–0.1)
BASOS PCT: 1 %
Eosinophils Absolute: 0.2 10*3/uL (ref 0.0–0.7)
Eosinophils Relative: 2 %
HEMATOCRIT: 33.6 % — AB (ref 39.0–52.0)
HEMOGLOBIN: 11.8 g/dL — AB (ref 13.0–17.0)
LYMPHS PCT: 18 %
Lymphs Abs: 1.9 10*3/uL (ref 0.7–4.0)
MCH: 29.9 pg (ref 26.0–34.0)
MCHC: 35.1 g/dL (ref 30.0–36.0)
MCV: 85.3 fL (ref 78.0–100.0)
MONO ABS: 0.8 10*3/uL (ref 0.1–1.0)
MONOS PCT: 7 %
NEUTROS ABS: 7.4 10*3/uL (ref 1.7–7.7)
NEUTROS PCT: 72 %
Platelets: 330 10*3/uL (ref 150–400)
RBC: 3.94 MIL/uL — ABNORMAL LOW (ref 4.22–5.81)
RDW: 13.5 % (ref 11.5–15.5)
WBC: 10.3 10*3/uL (ref 4.0–10.5)

## 2017-05-19 LAB — I-STAT TROPONIN, ED: TROPONIN I, POC: 0 ng/mL (ref 0.00–0.08)

## 2017-05-19 LAB — ETHANOL: Alcohol, Ethyl (B): <10 mg/dL (ref <10)

## 2017-05-19 MED ORDER — POTASSIUM CHLORIDE 10 MEQ/100ML IV SOLN
10.0000 meq | INTRAVENOUS | Status: AC
  Administered 2017-05-19 – 2017-05-20 (×2): 10 meq via INTRAVENOUS
  Filled 2017-05-19 (×3): qty 100

## 2017-05-19 MED ORDER — POTASSIUM CHLORIDE CRYS ER 20 MEQ PO TBCR
40.0000 meq | EXTENDED_RELEASE_TABLET | Freq: Once | ORAL | Status: AC
  Administered 2017-05-19: 40 meq via ORAL
  Filled 2017-05-19: qty 2

## 2017-05-19 MED ORDER — SODIUM CHLORIDE 0.9 % IV BOLUS (SEPSIS)
1000.0000 mL | Freq: Once | INTRAVENOUS | Status: AC
  Administered 2017-05-19: 1000 mL via INTRAVENOUS

## 2017-05-19 NOTE — ED Notes (Signed)
Bed: ZO10 Expected date:  Expected time:  Means of arrival:  Comments: 25m syncope

## 2017-05-19 NOTE — ED Triage Notes (Signed)
Pt BIB EMS, picked up on [address]. Complained of confusion originally but was able to answer all questions and is A&O. Pt has no complaints other than "not feeling right" which he has been evaluated extensively for. No fall, no LOC.

## 2017-05-19 NOTE — ED Provider Notes (Signed)
[facility] Provider Note   CSN: [id number] Arrival date & time: 05/19/17  1922     History   Chief Complaint Chief Complaint  Patient presents with  . Altered Mental Status    HPI Bryan Farrell is a 66 y.o. male history of diabetes, hypertension, chronic suicidal ideation, homelessness here presenting with altered mental status. Patient was noted to be slightly confused at [facility]. He actually told me that he may have passed out. He told me that he felt lightheaded dizzy and sat down but did not hit his head. He states that he is on his way to a shelter today  But usually lives out in the street. He was noted initially to be confused by EMS but then was A and O 3 in triage. Patient has been to the ED almost daily last week and had extensive evaluation as well as social work and case management consults.   The history is provided by the patient.    Past Medical History:  Diagnosis Date  . Diabetes mellitus without complication (HCC)   . Hypertension     Patient Active Problem List   Diagnosis Date Noted  . Moderate recurrent major depression (HCC)   . Adjustment disorder with depressed mood 04/13/2017  . Vitamin B12 deficiency 03/25/2017  . Vascular dementia 03/23/2017  . Suicidal ideation 03/23/2017  . Homelessness 03/23/2017  . Diabetes (HCC) 03/23/2017    Past Surgical History:  Procedure Laterality Date  . TONSILLECTOMY         Home Medications    Prior to Admission medications   Not on File    Family History Family History  Problem Relation Age of Onset  . Family history unknown: Yes    Social History Social History  Substance Use Topics  . Smoking status: Never Smoker  . Smokeless tobacco: Never Used  . Alcohol use No     Comment: BAC clear     Allergies   Patient has no known allergies.   Review of Systems Review of Systems  Neurological: Positive for dizziness.  All other systems reviewed and are  negative.    Physical Exam Updated Vital Signs BP 133/87 (BP Location: Left Arm)   Pulse 75   Temp 99.3 F (37.4 C) (Oral)   Resp 20   SpO2 98%   Physical Exam  Constitutional: He is oriented to person, place, and time.  Disheveled, unkempt   HENT:  Head: Normocephalic.  MM slightly dry, no signs of head trauma   Eyes: Pupils are equal, round, and reactive to light. Conjunctivae and EOM are normal.  Neck: Normal range of motion. Neck supple.  Cardiovascular: Normal rate and regular rhythm.   Pulmonary/Chest: Effort normal and breath sounds normal. No respiratory distress. He has no wheezes. He has no rales.  Abdominal: Soft. Bowel sounds are normal. He exhibits no distension. There is no tenderness.  Musculoskeletal: Normal range of motion.  Neurological: He is alert and oriented to person, place, and time.  A & O x 3. Nl strength throughout. Able to ambulate   Skin: Skin is warm.  Psychiatric:  Depressed (chronic), not suicidal   Nursing note and vitals reviewed.    ED Treatments / Results  Labs (all labs ordered are listed, but only abnormal results are displayed) Labs Reviewed  CBC WITH DIFFERENTIAL/PLATELET - Abnormal; Notable for the following:       Result Value   RBC 3.94 (*)    Hemoglobin 11.8 (*)  HCT 33.6 (*)    All other components within normal limits  COMPREHENSIVE METABOLIC PANEL - Abnormal; Notable for the following:    Potassium 2.5 (*)    Glucose, Bld 164 (*)    ALT 16 (*)    All other components within normal limits  ETHANOL  URINALYSIS, ROUTINE W REFLEX MICROSCOPIC  RAPID URINE DRUG SCREEN, HOSP PERFORMED  I-STAT TROPONIN, ED    EKG  EKG Interpretation None       Radiology No results found.  Procedures Procedures (including critical care time)  Medications Ordered in ED Medications  potassium chloride 10 mEq in 100 mL IVPB (not administered)  potassium chloride SA (K-DUR,KLOR-CON) CR tablet 40 mEq (not administered)  sodium  chloride 0.9 % bolus 1,000 mL (0 mLs Intravenous Stopped 05/19/17 2101)     Initial Impression / Assessment and Plan / ED Course  I have reviewed the triage vital signs and the nursing notes.  Pertinent labs & imaging results that were available during my care of the patient were reviewed by me and considered in my medical decision making (see chart for details).     Bryan Farrell is a 66 y.o. male here with confusion, near syncope that resolved. A & O x 3 in the ED. He is homeless and appears slightly dehydrated. Will check labs. Will hydrate patient and reassess.   10: 30 pm K 2.5. Supplemented. No vomiting or diarrhea. Just poor PO intake. Given the confusion earlier, will get CT head. But I think just dehydration.   11:41 PM CT head pending. Getting K runs. Anticipate discharge once potassium runs are finished. Signed out to Dr. Judd Lien to follow up CT head.    Final Clinical Impressions(s) / ED Diagnoses   Final diagnoses:  None    New Prescriptions New Prescriptions   No medications on file     Charlynne Pander, MD 05/19/17 2341

## 2017-05-20 LAB — URINALYSIS, ROUTINE W REFLEX MICROSCOPIC
BILIRUBIN URINE: NEGATIVE
Glucose, UA: 50 mg/dL — AB
HGB URINE DIPSTICK: NEGATIVE
Ketones, ur: NEGATIVE mg/dL
Leukocytes, UA: NEGATIVE
NITRITE: NEGATIVE
Protein, ur: NEGATIVE mg/dL
Specific Gravity, Urine: 1.016 (ref 1.005–1.030)
pH: 5 (ref 5.0–8.0)

## 2017-05-20 LAB — RAPID URINE DRUG SCREEN, HOSP PERFORMED
AMPHETAMINES: NOT DETECTED
Barbiturates: NOT DETECTED
Benzodiazepines: NOT DETECTED
COCAINE: NOT DETECTED
OPIATES: NOT DETECTED
Tetrahydrocannabinol: NOT DETECTED

## 2017-05-20 NOTE — ED Notes (Signed)
Attempted to discharge patient, patient is stating he is suicidal due to his homelessness and the fact that his kids will not take him in. EDP notified, TTS consult ordered.

## 2017-05-20 NOTE — Discharge Instructions (Signed)
Follow-up with your primary Dr. in the next week.  Return to the emergency department if you experience any new or concerning symptoms.  Follow up with  [facility] [address], [place] [address] [phone]  Hour of operations: Monday-Friday, 8:30-5 p.m.

## 2017-05-20 NOTE — BH Assessment (Signed)
Assessment Note  Bryan Farrell is an 66 y.o. male who presents voluntarly  reporting symptoms of depression and suicidal ideation. Pt has a history of homelessness, depression. Pt reports no medication or OP mental health treatment. Upon discharge from ED with initial complaint of confusion, pt reported current suicidal ideation with no specific plans, "I could do any number of things--whatever presents itself at the time". He states that he has one previous attempt of hanging himself in the past. He states that he is depressed due to his son not taking him in, his daughter changed her phone number and he is unable to get in touch with her.  Pt acknowledges symptoms including: difficulty sleeping, confusion. PT denies homicidal ideation/ history of violence, but says he has thoughts of harming his son. Pt denies auditory or visual hallucinations or other psychotic symptoms. Pt states current stressors include financial.  Pt denies history of abuse and trauma.  Pt has poor insight and judgment. Pt's memory is impaired. Pt denies legal history. ? Pt denies IP history, but his chart indicates that he has been IP at [facility] and [facility].  Pt denies alcohol/ substance abuse currently but says he has a history of marijuana use. ? MSE: Pt is casually dressed, alert, oriented x3 with normal speech and normal motor behavior. Eye contact is good. Pt's mood is depressed and affect is depressed and irritable. Affect is congruent with mood. Thought process is coherent and relevant. There is no indication pt is currently responding to internal stimuli or experiencing delusional thought content. Pt was cooperative throughout assessment. Pt is currently unable to contract for safety outside the hospital and wants inpatient psychiatric treatment.  Per Elta Guadeloupe, NP pt is at baseline and should be discharged.    Diagnosis: MDD without psychotic features  Past Medical History:  Past Medical History:  Diagnosis  Date  . Diabetes mellitus without complication (HCC)   . Hypertension     Past Surgical History:  Procedure Laterality Date  . TONSILLECTOMY      Family History:  Family History  Problem Relation Age of Onset  . Family history unknown: Yes    Social History:  reports that he has never smoked. He has never used smokeless tobacco. He reports that he does not drink alcohol or use drugs.  Additional Social History:  Alcohol / Drug Use Pain Medications: denies Prescriptions: denies Over the Counter: denies History of alcohol / drug use?:  (hx of marijuana use) Longest period of sobriety (when/how long): unk Withdrawal Symptoms:  (denies)  CIWA: CIWA-Ar BP: 140/86 Pulse Rate: 61 COWS:    Allergies: No Known Allergies  Home Medications:  (Not in a hospital admission)  OB/GYN Status:  No LMP for male patient.  General Assessment Data Location of Assessment: [facility] ED TTS Assessment: In system Is this a Tele or Face-to-Face Assessment?: Face-to-Face Is this an Initial Assessment or a Re-assessment for this encounter?: Initial Assessment Marital status: Divorced Living Arrangements: Other (Comment) (homeless) Can pt return to current living arrangement?: Yes Admission Status: Voluntary Is patient capable of signing voluntary admission?: Yes Referral Source: Self/Family/Friend Insurance type: Sp     Crisis Care Plan Living Arrangements: Other (Comment) (homeless) Name of Psychiatrist: Na Name of Therapist: NA  Education Status Is patient currently in school?: No  Risk to self with the past 6 months Suicidal Ideation: Yes-Currently Present Has patient been a risk to self within the past 6 months prior to admission? : No Suicidal Intent: No Has  patient had any suicidal intent within the past 6 months prior to admission? : Yes Is patient at risk for suicide?: Yes Suicidal Plan?: No Has patient had any suicidal plan within the past 6 months prior to admission? :  Yes Specify Current Suicidal Plan:  ("any number of ways, whatever presents itself") Access to Means: Yes Specify Access to Suicidal Means: envoronment, but nothing specific What has been your use of drugs/alcohol within the last 12 months?: denies Previous Attempts/Gestures: Yes How many times?: 1 Other Self Harm Risks: none known Triggers for Past Attempts: Unknown Intentional Self Injurious Behavior: None Family Suicide History: No Recent stressful life event(s): Conflict (Comment), Financial Problems (homelessness, conflict with son) Persecutory voices/beliefs?: Yes Depression: Yes Depression Symptoms: Isolating, Feeling angry/irritable, Loss of interest in usual pleasures, Despondent Substance abuse history and/or treatment for substance abuse?: No Suicide prevention information given to non-admitted patients: Not applicable  Risk to Others within the past 6 months Homicidal Ideation: No Does patient have any lifetime risk of violence toward others beyond the six months prior to admission? : No Thoughts of Harm to Others: Yes-Currently Present Comment - Thoughts of Harm to Others: thoughts of hariming son who won't take him in Current Homicidal Intent: No Current Homicidal Plan: No Access to Homicidal Means: No History of harm to others?: No Assessment of Violence: None Noted Does patient have access to weapons?: No Criminal Charges Pending?: No Does patient have a court date: No Is patient on probation?: No  Psychosis Hallucinations: None noted Delusions: None noted  Mental Status Report Appearance/Hygiene: Disheveled, Poor hygiene Eye Contact: Fair Motor Activity: Unremarkable Speech: Logical/coherent Level of Consciousness: Alert Mood: Depressed, Irritable Affect: Irritable, Depressed Anxiety Level: Moderate Thought Processes: Coherent, Relevant Judgement: Partial Orientation: Person, Place, Situation (thought it was September 2017) Obsessive Compulsive  Thoughts/Behaviors: None  Cognitive Functioning Concentration: Fair Memory: Recent Impaired, Remote Intact IQ: Average Insight: Poor Impulse Control: Fair Appetite: Good Weight Loss: 0 Weight Gain: 0 Sleep: Decreased Total Hours of Sleep: 6 Vegetative Symptoms: Decreased grooming  ADLScreening [facility]) Patient's cognitive ability adequate to safely complete daily activities?: Yes Patient able to express need for assistance with ADLs?: Yes Independently performs ADLs?: Yes (appropriate for developmental age)  Prior Inpatient Therapy Prior Inpatient Therapy:  (pt denies) Prior Therapy Dates: multiple Prior Therapy Facilty/Provider(s): [facility], [facility] Reason for Treatment: SI  Prior Outpatient Therapy Prior Outpatient Therapy: No Prior Therapy Dates: NA Prior Therapy Facilty/Provider(s): NA Reason for Treatment: NA Does patient have an ACCT team?: No Does patient have Intensive In-House Services?  : No Does patient have [facility] services? : No Does patient have P4CC services?: No  ADL Screening (condition at time of admission) Patient's cognitive ability adequate to safely complete daily activities?: Yes Is the patient deaf or have difficulty hearing?: No Does the patient have difficulty seeing, even when wearing glasses/contacts?: No Does the patient have difficulty concentrating, remembering, or making decisions?: No Patient able to express need for assistance with ADLs?: Yes Does the patient have difficulty dressing or bathing?: No Independently performs ADLs?: Yes (appropriate for developmental age) Communication: Independent Does the patient have difficulty walking or climbing stairs?: No Weakness of Legs: None Weakness of Arms/Hands: None  Home Assistive Devices/Equipment Home Assistive Devices/Equipment: None    Abuse/Neglect Assessment (Assessment to be complete while patient is alone) Physical Abuse: Denies Verbal Abuse: Denies Sexual Abuse:  Denies Exploitation of patient/patient's resources: Denies Self-Neglect: Denies Values / Beliefs Cultural Requests During Hospitalization: None Spiritual Requests During Hospitalization: None  Advance Directives (For Healthcare) Does Patient Have a Medical Advance Directive?: No Would patient like information on creating a medical advance directive?: No - Patient declined    Additional Information 1:1 In Past 12 Months?: No CIRT Risk: No Elopement Risk: No Does patient have medical clearance?: Yes     Disposition:  Disposition Initial Assessment Completed for this Encounter: Yes Disposition of Patient: Other dispositions Other disposition(s): Other (Comment) (review by psychiatry)  On Site Evaluation by:   Reviewed with Physician:    Theo Dills 05/20/2017 7:50 AM

## 2017-05-20 NOTE — ED Provider Notes (Addendum)
Care assumed from Dr. Silverio Lay at shift change. Patient presented here with weakness and confusion. His workup reveals low potassium which was replaced but is otherwise unremarkable. Head CT pending at the time of shift change is negative. He has been resting comfortably throughout the night. I see no indication for admission or further workup. He will be discharged, to follow-up as needed.   Geoffery Lyons, MD 05/20/17 [phone]  ADDENDUM:  When patient was being discharged, he now informs the nurse that he is suicidal. TTS will be consult at for evaluation.   Geoffery Lyons, MD 05/20/17 [phone]

## 2017-05-20 NOTE — BHH Suicide Risk Assessment (Signed)
Suicide Risk Assessment  Discharge Assessment   [facility] Discharge Suicide Risk Assessment   Principal Problem: Adjustment disorder with depressed mood Discharge Diagnoses:  Patient Active Problem List   Diagnosis Date Noted  . Moderate recurrent major depression (HCC) [F33.1]   . Adjustment disorder with depressed mood [F43.21] 04/13/2017  . Vitamin B12 deficiency [E53.8] 03/25/2017  . Vascular dementia [F01.50] 03/23/2017  . Suicidal ideation [R45.851] 03/23/2017  . Homelessness [Z59.0] 03/23/2017  . Diabetes (HCC) [E11.9] 03/23/2017  Pt presents to the [facility] with depressed mood and chronic, passive suicidal ideation. Pt is homeless and presents to local emergency rooms on an almost daily basis for shelter and food. Pt is at baseline and recommended for discharge by treatment team.   Total Time spent with patient: 45 minutes  Musculoskeletal: Strength & Muscle Tone: within normal limits Gait & Station: normal Patient leans: N/A  Psychiatric Specialty Exam:   Blood pressure (!) 129/91, pulse 67, temperature 99.3 F (37.4 C), temperature source Oral, resp. rate 13, SpO2 98 %.There is no height or weight on file to calculate BMI.  General Appearance: Disheveled  Eye Solicitor::  Fair  Speech:  Clear and Coherent409  Volume:  Normal  Mood:  Depressed and Irritable  Affect:  Congruent and Depressed  Thought Process:  Coherent, Goal Directed and Linear  Orientation:  Full (Time, Place, and Person)  Thought Content:  Logical  Suicidal Thoughts:  Yes.  without intent/plan  Homicidal Thoughts:  No  Memory:  Immediate;   Good Recent;   Fair Remote;   Fair  Judgement:  Fair  Insight:  Lacking  Psychomotor Activity:  Decreased  Concentration:  Fair  Recall:  Good  Fund of Knowledge:Fair  Language: Good  Akathisia:  No  Handed:  Right  AIMS (if indicated):     Assets:  Communication Skills Resilience Social Support  Sleep:     Cognition: WNL  ADL's:  Intact   Mental Status  Per Nursing Assessment::   On Admission:   depressed with passive, chronic suicidal ideation  Demographic Factors:  Male, Caucasian, Low socioeconomic status, Living alone and Unemployed  Loss Factors: Decline in physical health and Financial problems/change in socioeconomic status  Historical Factors: Impulsivity  Risk Reduction Factors:   Sense of responsibility to family  Continued Clinical Symptoms:  Depression:   Impulsivity More than one psychiatric diagnosis  Cognitive Features That Contribute To Risk:  Closed-mindedness    Suicide Risk:  Minimal: No identifiable suicidal ideation.  Patients presenting with no risk factors but with morbid ruminations; may be classified as minimal risk based on the severity of the depressive symptoms    Plan Of Care/Follow-up recommendations:  Activity:  as tolerated Diet:  Heart Healthy  Laveda Abbe, NP 05/20/2017, 8:29 AM

## 2019-04-01 ENCOUNTER — Encounter (HOSPITAL_COMMUNITY): Payer: Self-pay | Admitting: Emergency Medicine

## 2019-04-01 ENCOUNTER — Emergency Department (HOSPITAL_COMMUNITY)
Admission: EM | Admit: 2019-04-01 | Discharge: 2019-04-02 | Disposition: A | Discharge status: Home / Self Care | Payer: Medicare Other | Attending: Emergency Medicine | Admitting: Emergency Medicine

## 2019-04-01 DIAGNOSIS — F332 Major depressive disorder, recurrent severe without psychotic features: Secondary | ICD-10-CM | POA: Diagnosis not present

## 2019-04-01 DIAGNOSIS — Z5900 Homelessness unspecified: Secondary | ICD-10-CM

## 2019-04-01 DIAGNOSIS — I10 Essential (primary) hypertension: Secondary | ICD-10-CM | POA: Insufficient documentation

## 2019-04-01 DIAGNOSIS — E119 Type 2 diabetes mellitus without complications: Secondary | ICD-10-CM | POA: Insufficient documentation

## 2019-04-01 DIAGNOSIS — Z20828 Contact with and (suspected) exposure to other viral communicable diseases: Secondary | ICD-10-CM | POA: Insufficient documentation

## 2019-04-01 DIAGNOSIS — Z046 Encounter for general psychiatric examination, requested by authority: Secondary | ICD-10-CM | POA: Diagnosis present

## 2019-04-01 LAB — COMPREHENSIVE METABOLIC PANEL
ALT: 29 U/L (ref 0–44)
AST: 24 U/L (ref 15–41)
Albumin: 3.7 g/dL (ref 3.5–5.0)
Alkaline Phosphatase: 66 U/L (ref 38–126)
Anion gap: 11 (ref 5–15)
BUN: 11 mg/dL (ref 8–23)
CO2: 26 mmol/L (ref 22–32)
Calcium: 9.1 mg/dL (ref 8.9–10.3)
Chloride: 100 mmol/L (ref 98–111)
Creatinine, Ser: 0.97 mg/dL (ref 0.61–1.24)
GFR calc Af Amer: >60 mL/min (ref >60)
GFR calc non Af Amer: >60 mL/min (ref >60)
Glucose, Bld: 206 mg/dL — ABNORMAL HIGH (ref 70–99)
Potassium: 4.3 mmol/L (ref 3.5–5.1)
Sodium: 137 mmol/L (ref 135–145)
Total Bilirubin: 0.5 mg/dL (ref 0.3–1.2)
Total Protein: 6.5 g/dL (ref 6.5–8.1)

## 2019-04-01 LAB — CBC
HCT: 39.4 % (ref 39.0–52.0)
Hemoglobin: 12.6 g/dL — ABNORMAL LOW (ref 13.0–17.0)
MCH: 29.7 pg (ref 26.0–34.0)
MCHC: 32 g/dL (ref 30.0–36.0)
MCV: 92.9 fL (ref 80.0–100.0)
Platelets: 279 10*3/uL (ref 150–400)
RBC: 4.24 MIL/uL (ref 4.22–5.81)
RDW: 13.5 % (ref 11.5–15.5)
WBC: 8.4 10*3/uL (ref 4.0–10.5)
nRBC: 0 % (ref 0.0–0.2)

## 2019-04-01 LAB — ACETAMINOPHEN LEVEL: Acetaminophen (Tylenol), Serum: <10 ug/mL — ABNORMAL LOW (ref 10–30)

## 2019-04-01 LAB — SALICYLATE LEVEL: Salicylate Lvl: <7 mg/dL (ref 2.8–30.0)

## 2019-04-01 LAB — ETHANOL: Alcohol, Ethyl (B): <10 mg/dL (ref <10)

## 2019-04-01 NOTE — ED Provider Notes (Signed)
[facility] Provider Note   CSN: [id number] Arrival date & time: 04/01/19  1430    History   Chief Complaint Chief Complaint  Patient presents with  . Suicidal    HPI Bryan Farrell is a 68 y.o. male.     68 y.o male with a PMH of HTN, DM presents to the ED with complaints of SI.  Patient reports he does not know the reason why he is here, states that he is not very comfortable sitting down.  According to records patient does have an extensive amount of visits into the ED for homelessness along with suicidal ideations.  When inquired about suicidal ideations, homicidal ideations or hallucinations.  Patient denies all of these.  Currently comfortably eating dinner.  No chest pain, shortness of breath, abdominal pain.  The history is provided by the patient and medical records.    Past Medical History:  Diagnosis Date  . Diabetes mellitus without complication ([place])   . Hypertension     Patient Active Problem List   Diagnosis Date Noted  . Moderate recurrent major depression ([place])   . Adjustment disorder with depressed mood 04/13/2017  . Vitamin B12 deficiency 03/25/2017  . Vascular dementia ([place]) 03/23/2017  . Suicidal ideation 03/23/2017  . Homelessness 03/23/2017  . Diabetes ([place]) 03/23/2017    Past Surgical History:  Procedure Laterality Date  . TONSILLECTOMY          Home Medications    Prior to Admission medications   Not on File    Family History Family History  Family history unknown: Yes    Social History Social History   Tobacco Use  . Smoking status: Never Smoker  . Smokeless tobacco: Never Used  Substance Use Topics  . Alcohol use: No    Comment: BAC clear  . Drug use: No    Comment: UDS clear     Allergies   Patient has no known allergies.   Review of Systems Review of Systems  Constitutional: Negative for chills and fever.  HENT: Negative for ear pain and sore throat.   Eyes:  Negative for pain and visual disturbance.  Respiratory: Negative for cough and shortness of breath.   Cardiovascular: Negative for chest pain and palpitations.  Gastrointestinal: Negative for abdominal pain and vomiting.  Genitourinary: Negative for dysuria and hematuria.  Musculoskeletal: Negative for arthralgias and back pain.  Skin: Negative for color change and rash.  Neurological: Negative for seizures and syncope.  Psychiatric/Behavioral: Negative for hallucinations and suicidal ideas. The patient is not hyperactive.   All other systems reviewed and are negative.    Physical Exam Updated Vital Signs BP 100/71   Pulse 96   Temp 98.5 F (36.9 C) (Oral)   Resp 16   SpO2 97%   Physical Exam Vitals signs and nursing note reviewed.  Constitutional:      Appearance: He is well-developed.  HENT:     Head: Normocephalic and atraumatic.  Eyes:     General: No scleral icterus.    Pupils: Pupils are equal, round, and reactive to light.  Neck:     Musculoskeletal: Normal range of motion.  Cardiovascular:     Heart sounds: Normal heart sounds.  Pulmonary:     Effort: Pulmonary effort is normal.     Breath sounds: Normal breath sounds. No wheezing.  Chest:     Chest wall: No tenderness.  Abdominal:     General: Bowel sounds are normal. There is no  distension.     Palpations: Abdomen is soft.     Tenderness: There is no abdominal tenderness.  Musculoskeletal:        General: No tenderness or deformity.  Skin:    General: Skin is warm and dry.  Neurological:     Mental Status: He is alert and oriented to person, place, and time.  Psychiatric:        Attention and Perception: He is attentive. He does not perceive auditory or visual hallucinations.        Mood and Affect: Affect is flat.        Speech: Speech is delayed.        Behavior: Behavior is cooperative.      ED Treatments / Results  Labs (all labs ordered are listed, but only abnormal results are displayed)  Labs Reviewed  COMPREHENSIVE METABOLIC PANEL - Abnormal; Notable for the following components:      Result Value   Glucose, Bld 206 (*)    All other components within normal limits  ACETAMINOPHEN LEVEL - Abnormal; Notable for the following components:   Acetaminophen (Tylenol), Serum <10 (*)    All other components within normal limits  CBC - Abnormal; Notable for the following components:   Hemoglobin 12.6 (*)    All other components within normal limits  ETHANOL  SALICYLATE LEVEL  RAPID URINE DRUG SCREEN, HOSP PERFORMED    EKG None  Radiology No results found.  Procedures Procedures (including critical care time)  Medications Ordered in ED Medications - No data to display   Initial Impression / Assessment and Plan / ED Course  I have reviewed the triage vital signs and the nursing notes.  Pertinent labs & imaging results that were available during my care of the patient were reviewed by me and considered in my medical decision making (see chart for details).      Patient with multiple visits to the ED, presents today with suicidal ideations.  Patient's last visit was over a year ago, according to the chart [facility] voicing suicidal ideations.  During my encounter with patient, he reports he does not know why he is here, states he does not have any suicidal ideations, homicidal ideations or hallucinations.  CMP showed slight elevation in glucose, this of a previous history of diabetes.  CBC showed showed no leukocytosis, hemoglobin is slightly decreased but within patient's baseline.  Acetaminophen level was less than 10, salicylate level is normal along with ethanol level.  Patient's vitals are within normal limits.  Patient has been eating and resting in chair, without any complaints of chest pain, shortness of breath or abdominal pain.  Will place TTS consult for ultimate clearance.  Patient is medically stable for psychiatric evaluation.     11:02 PM patient stable in the ED, pending TTS consult.  Portions of this note were generated with Scientist, clinical (histocompatibility and immunogenetics)Dragon dictation software. Dictation errors may occur despite best attempts at proofreading.     Final Clinical Impressions(s) / ED Diagnoses   Final diagnoses:  Homelessness    ED Discharge Orders    None       Claude MangesSoto, Laureen Frederic, PA-C 04/01/19 2302    Virgina Norfolkuratolo, Adam, DO 04/01/19 2359

## 2019-04-01 NOTE — ED Triage Notes (Signed)
Pt arrives with a [employer] escort from a homeless shelter where he was picked up due to have SI thoughts. [facility] and per patient he was discharged from there today.   Pt states he is just not feeling any better and he was been thinking about walking out into traffic all day.

## 2019-04-02 DIAGNOSIS — Z590 Homelessness: Secondary | ICD-10-CM | POA: Diagnosis not present

## 2019-04-02 LAB — RAPID URINE DRUG SCREEN, HOSP PERFORMED
Amphetamines: NOT DETECTED
Barbiturates: NOT DETECTED
Benzodiazepines: NOT DETECTED
Cocaine: NOT DETECTED
Opiates: NOT DETECTED
Tetrahydrocannabinol: NOT DETECTED

## 2019-04-02 LAB — SARS CORONAVIRUS 2 BY RT PCR (HOSPITAL ORDER, PERFORMED IN ~~LOC~~ HOSPITAL LAB): SARS Coronavirus 2: NEGATIVE

## 2019-04-02 NOTE — ED Provider Notes (Signed)
Pt has been deemed clear for d/c No acute issues at this time    Ripley Fraise, MD 04/02/19 [id number]

## 2019-04-02 NOTE — BH Assessment (Addendum)
Tele Assessment Note   Patient Name: Bryan Farrell MRN: [id number] Referring Physician: Claude MangesJohana Soto, PA-C [facility] Location of Provider: Behavioral Health TTS Department  Bryan Farrell is a 68 y.o. male who [facility] after they were called to the homeless shelter due to pt expressing SI. According to pt's PA's report, upon arriving to the ED, pt stated he did not know the reason he was at the ED; PA's note states pt has an extensive number of visits to the ED for homelessness and for SI in the past. Pt's nurse's note states [facility], as he was d/c from their hospital earlier today. PA's note states that pt was later comfortably eating dinner with no complaints. When clinician inquired of pt as to the reason for his visit to the ED, pt states it was due to his thoughts of a suicide attempt, though he cannot remember when it was--he was thinking it was several days ago. Clinician inquired as to whether pt believes he would have followed through with the attempt and pt stated that he didn't know. Pt stated he has attempted to kill himself on one occasion, possibly several years ago, though he's not sure, when he attempted to hang himself; pt states he was hospitalized after this incident and that that's the only time he remembers being hospitalized for mental health reasons.  Pt denies HI, AVH, NSSIB, access to guns/weapons, engagement in the legal system, and SA. Clinician asked pt if he believes he could be safe if he was d/c from the hospital tonight and pt stated, "I don't know." Clinician asked pt if he has a plan for himself and what that plan is, and pt stated, "I don't know."  Pt stated he does not have anyone for clinician to contact for collateral information.  Pt was oriented x3; [place] and that the month was July, though he recognized the date and year and that he was  in a hospital, as well as his name and date of birth. Pt's recent memory is good, though his remote memory is of concern. Pt was cooperative throughout the assessment process. Pt's insight, judgement, and impulse control is impaired at this time.   Diagnosis: F33.2, Major depressive disorder, Recurrent episode, Severe   Past Medical History:  Past Medical History:  Diagnosis Date  . Diabetes mellitus without complication ([facility])   . Hypertension     Past Surgical History:  Procedure Laterality Date  . TONSILLECTOMY      Family History:  Family History  Family history unknown: Yes    Social History:  reports that he has never smoked. He has never used smokeless tobacco. He reports that he does not drink alcohol or use drugs.  Additional Social History:  Alcohol / Drug Use Pain Medications: Please see MAR Prescriptions: Please see MAR Over the Counter: Please see MAR History of alcohol / drug use?: No history of alcohol / drug abuse Longest period of sobriety (when/how long): Pt denies SA  CIWA: CIWA-Ar BP: 124/83 Pulse Rate: 80 COWS:    Allergies: No Known Allergies  Home Medications: (Not in a hospital admission)   OB/GYN Status:  No LMP for male patient.  General Assessment Data [facility] T[facility] Assessment: In system Is this a Tele or Face-to-Face Assessment?: Tele Assessment Is this an Initial Assessment or a Re-assessment for this encounter?:  Initial Assessment Patient Accompanied by:: N/A Language Other than English: No Living Arrangements: Homeless/Shelter What gender do you identify as?: Male Marital status: Divorced New[place] name: Godwin Pregnancy Status: No Living Arrangements: Alone Can pt return to current living arrangement?: Yes Admission Status: Voluntary Is patient capable of signing voluntary admission?: Yes Referral Source: Self/Family/Friend Insurance type: Medicare     Crisis Care Plan Living Arrangements: Alone Legal  Guardian: Other:(Self) Name of Psychiatrist: None Name of Therapist: None  Education Status Is patient currently in school?: No Is the patient employed, unemployed or receiving disability?: Unemployed  Risk to self with the past 6 months Suicidal Ideation: No Has patient been a risk to self within the past 6 months prior to admission? : Yes Suicidal Intent: No Has patient had any suicidal intent within the past 6 months prior to admission? : Yes Is patient at risk for suicide?: No Suicidal Plan?: No Has patient had any suicidal plan within the past 6 months prior to admission? : No Access to Means: No What has been your use of drugs/alcohol within the last 12 months?: Pt denies SA Previous Attempts/Gestures: Yes How many times?: 1 Other Self Harm Risks: Pt is homeless Triggers for Past Attempts: Unknown Intentional Self Injurious Behavior: None Family Suicide History: Unknown Recent stressful life event(s): Other (Comment)(Pt is homeless) Persecutory voices/beliefs?: No Depression: No Depression Symptoms: Feeling worthless/self pity, Feeling angry/irritable Substance abuse history and/or treatment for substance abuse?: No Suicide prevention information given to non-admitted patients: Yes  Risk to Others within the past 6 months Homicidal Ideation: No Does patient have any lifetime risk of violence toward others beyond the six months prior to admission? : No Thoughts of Harm to Others: No Current Homicidal Intent: No Current Homicidal Plan: No Access to Homicidal Means: No Identified Victim: None noted History of harm to others?: No Assessment of Violence: None Noted Violent Behavior Description: None noted Does patient have access to weapons?: No(Pt denies access to guns/weapons) Criminal Charges Pending?: No Does patient have a court date: No Is patient on probation?: No  Psychosis Hallucinations: None noted Delusions: None noted  Mental Status  Report Appearance/Hygiene: In scrubs Eye Contact: Good Motor Activity: Unremarkable Speech: Other (Comment)(Contradictory) Level of Consciousness: Alert Mood: Suspicious Affect: Appropriate to circumstance Anxiety Level: Minimal Thought Processes: Circumstantial Judgement: Partial Orientation: Person, Situation, Place Obsessive Compulsive Thoughts/Behaviors: None  Cognitive Functioning Concentration: Good Memory: Recent Intact, Remote Impaired Is patient IDD: No Insight: Fair Impulse Control: Fair Appetite: Good Have you had any weight changes? : No Change Sleep: No Change Total Hours of Sleep: 7 Vegetative Symptoms: None  ADLScreening [facility] Assessment Services) Patient's cognitive ability adequate to safely complete daily activities?: Yes Patient able to express need for assistance with ADLs?: Yes Independently performs ADLs?: Yes (appropriate for developmental age)  Prior Inpatient Therapy Prior Inpatient Therapy: Yes Prior Therapy Dates: Multiple Prior Therapy Facilty/Provider(s): Multiple Reason for Treatment: MDD  Prior Outpatient Therapy Prior Outpatient Therapy: No Does patient have an ACCT team?: No Does patient have Intensive In-House Services?  : No Does patient have [facility] services? : No Does patient have P4CC services?: No  ADL Screening (condition at time of admission) Patient's cognitive ability adequate to safely complete daily activities?: Yes Is the patient deaf or have difficulty hearing?: No Does the patient have difficulty seeing, even when wearing glasses/contacts?: No Does the patient have difficulty concentrating, remembering, or making decisions?: Yes Patient able to express need for assistance with ADLs?: Yes Does the patient have difficulty dressing  or bathing?: No Independently performs ADLs?: Yes (appropriate for developmental age) Does the patient have difficulty walking or climbing stairs?: No Weakness of Legs: None Weakness of  Arms/Hands: None  Home Assistive Devices/Equipment Home Assistive Devices/Equipment: None  Therapy Consults (therapy consults require a physician order) PT Evaluation Needed: No OT Evalulation Needed: No SLP Evaluation Needed: No Abuse/Neglect Assessment (Assessment to be complete while patient is alone) Abuse/Neglect Assessment Can Be Completed: Yes Physical Abuse: Denies Verbal Abuse: Denies Sexual Abuse: Denies Exploitation of patient/patient's resources: Denies Self-Neglect: Denies Values / Beliefs Cultural Requests During Hospitalization: None Spiritual Requests During Hospitalization: None Consults Spiritual Care Consult Needed: No Social Work Consult Needed: No Merchant navy officerAdvance Directives (For Healthcare) Does Patient Have a Medical Advance Directive?: No Would patient like information on creating a medical advance directive?: No - Patient declined       Disposition: Lerry Linerashaun Dixon, NP, reviewed pt's chart and information and determined pt can be d/c with resource information. This information was provided to pt's nurse, Gabriel RungMonique RN, at [phone].   Disposition Initial Assessment Completed for this Encounter: Yes Patient referred to: Other (Comment)(Pt was psych cleared and provided outpatient resource info)  This service was provided via telemedicine using a 2-way, interactive audio and video technology.  Names of all persons participating in this telemedicine service and their role in this encounter. Name: Linford Arnoldonald Farrell Role: Patient  Name: Lerry Linerashaun Dixon Role: Nurse Practitioner  Name: Duard BradySamantha Monserrate Blaschke Role: Clinician    Ralph DowdySamantha L Jadis Pitter 04/02/2019 2:11 AM

## 2019-04-02 NOTE — ED Notes (Signed)
TTS in process 

## 2019-04-02 NOTE — Discharge Instructions (Addendum)
Substance Abuse Treatment Programs ° °Intensive Outpatient Programs °[facility]     °[address]      °[place], [place]                   °[phone]      ° °[facility] °[address] °[place], [place] °[phone] ° °[facility]     °(Inpatient and outpatient)     °[address]           °[phone]   ° °[facility] °336-288-1484 (Suboxone and Methadone) ° °[address]      °[place], [place] [address]      °[phone]      ° °[address] °[place], [place] °852-3033 ° °[facility] (Outpatient/Inpatient, [facility])    °(insurance only) [phone]      °       °[facility] (Groups & Residential) °[place], [place] °[phone] ° °   °[facility]     °[address]     °[place], [place]      °[phone]      ° °[facility] (for caregivers and family) °[address] °[place], [place] °336-299-4655 ° ° ° ° ° °Residential Treatment Programs °[facility]      °[address], [place], [place] [address]  °([phone]      ° °[facility] °[address] [place], [place] [address] °[phone] ° °[facility]        °336-248-8914      ° °[facility] °1-800-659-3381 ° °[facility]             °[address]                                         °[place], [place]                                                °877-615-2722 or 336-784-9470                              ° °[facility] [place] °[address] °[place], [address] °1.877.941.8954 ° °[facility]    °[address]      °, [place]     °[phone]      ° °[facility] °[address] °, [place] °336-285-9073 ° °[facility]   °[address]     °[place], [place] [address]     °[phone]      °Admissions: 8am-3pm M-F ° °[facility] °[address] °[place],  [place] °336-227-7417 ° °[facility]: Residential Program (90 Days)   °[place], [place]      °336-725-8389 or 800-758-6077    ° °[facility] °[place], [place] °(Walk in Hours over the weekend or by referral) ° °[facility] °[address], [place],  [address] °([phone] ° °Crisis Mobile: [facility]:  1-877-626-1772 (for crisis response 24 hours a day) °[facility] Hotline:      1-800-256-2452 °Outpatient Psychiatry and Counseling ° °[facility]: Mobile Crisis   Management 24 hours:  1-877-626-1772 ° °[facility] sliding scale fee and walk in schedule: M-F 8am-12pm/1pm-3pm °[address]  °[place], [place] [address] °[phone] ° °[facility] °[address] °[place], [place] [address] °336-703-9650 ° °[facility] ([facility])- new patient walk-in appointments available Monday - Friday 8am -3pm.          °[address] °[place], [place] [address] °336-676-6840 or crisis line- 336-676-6905 ° °[facility]/ Intensive Outpatient Therapy Program °[address] °[place], [place] [address] °336-832-9804 ° °[facility]                  °Crisis Services      °336.641.4993      °[address]     °[place], [place] [address]                ° °[facility]   °[facility] °[phone] °[address] °[place], [place] [address] ° ° °[facility]          °[address]  °[place], [place] [address]       °([phone] ° °[facility] °[address] °[place], [place] [address] °336-292-1510 ° °[facility]    °[address]    °[place], [place] [address]     °[phone]      ° °Parish McKinney, MD     °[address]     °[place] [address]     °336-282-1251     °  °[facility] °[address] °[place] [address] ° °[facility]     °[address]     °[place], [place]      °336-542-2076      ° °[facility] °Shamsher Ahluwalia, MD °[address] °[place], [place] [address] °[phone] ° °[facility]     °[address]     °[place], [place] [address]     °336-274-1237      ° °[facility] °[address] °[place], [place] [address] °336-854-4450 °Resources for Temporary Residential Assistance/Crisis Centers ° °DAY CENTERS °[facility] °M-F 8am-3pm   °[address] [place], [place] [address]   336-332-0824 °Services include: laundry, barbering, support groups, case management, phone  & computer access, showers, AA/NA mtgs, mental health/substance abuse nurse, job skills class, disability information, VA assistance, spiritual classes, etc.  ° °HOMELESS SHELTERS ° °[facility]     °[facility]   °[address], [place]     °336.271.5959       °       °[facility] (women and children)       °[address] °, [place] [address] °336-275-0820 °[email or web address] for application and process °Application Required ° °[facility]   °[address]    °[place] [address]     °[phone]       °             °[facility] °[address] °,  [address] °336.273.5572 °336-235-0363(schedule application appt.) °Application Required ° °[facility] (women only)    °[address]     °[place],  [address]     °336-884-1039      °  Intake starts 6pm daily °Need valid ID, SSC, & Police report °[facility] [place] °[address] °[place], [place] °336-881-5420 °Application Required ° °[facility] (men only)     °[address]      °[place], [place]     °[phone]      ° °[facility] °(Pregnant women only) °[address] °[place], [place] °336-275-0206 ° °[facility]      °[address]      °[place], [place] [address]     °[phone]      °       °[facility] °[address] °[place], [place] °336-723-1848 °90 day commitment/SA/Application process ° °[facility](men only)     °[address]     °[place], [place]     °[phone]       °Check-in at 7pm     °       °[facility] °[address] °[place], [place] [address] °336-248-6684 °Men/Women/Women and Children must be there by 7 pm ° °[facility] °[place], [place] °336-722-8721                ° °

## 2019-04-02 NOTE — ED Notes (Signed)
Pt given clothes and SW came to see him

## 2019-04-02 NOTE — Discharge Planning (Signed)
EDCM spoke with pt at bedside regarding homeless shelters and resources.  EDCM discussed [employer], of which pt was familiar to assist with homeless shelter placement.  Pt states he is familiar with public transportation and will go to [facility] from ED.    Pt states he only has paper clothing from hospital.   [facility] asked EDSW to bring clothing for pt.  No further EDCM needs identified.

## 2019-04-02 NOTE — ED Notes (Signed)
Requesting SW consult

## 2019-04-03 ENCOUNTER — Encounter (HOSPITAL_COMMUNITY): Payer: Self-pay

## 2019-04-03 ENCOUNTER — Other Ambulatory Visit: Payer: Self-pay

## 2019-04-03 ENCOUNTER — Emergency Department (HOSPITAL_COMMUNITY)
Admission: EM | Admit: 2019-04-03 | Discharge: 2019-04-03 | Disposition: A | Discharge status: Home / Self Care | Payer: Medicare Other | Attending: Emergency Medicine | Admitting: Emergency Medicine

## 2019-04-03 DIAGNOSIS — E119 Type 2 diabetes mellitus without complications: Secondary | ICD-10-CM | POA: Diagnosis not present

## 2019-04-03 DIAGNOSIS — I10 Essential (primary) hypertension: Secondary | ICD-10-CM | POA: Diagnosis not present

## 2019-04-03 DIAGNOSIS — M79672 Pain in left foot: Secondary | ICD-10-CM | POA: Insufficient documentation

## 2019-04-03 DIAGNOSIS — Z89411 Acquired absence of right great toe: Secondary | ICD-10-CM | POA: Insufficient documentation

## 2019-04-03 DIAGNOSIS — M79671 Pain in right foot: Secondary | ICD-10-CM

## 2019-04-03 MED ORDER — TRAMADOL HCL 50 MG PO TABS
50.0000 mg | ORAL_TABLET | Freq: Once | ORAL | Status: AC
  Administered 2019-04-03: 50 mg via ORAL
  Filled 2019-04-03: qty 1

## 2019-04-03 NOTE — Discharge Instructions (Addendum)
You will need to follow-up with the [facility] for further evaluation.  Rest and elevation of your feet will help.  Use ice on your feet as well.

## 2019-04-03 NOTE — ED Triage Notes (Signed)
Per EMS- Patient was sitting on a sidewalk and patient asked a bystander to call 911 because he was having foot pain and could not walk. EMS reported that the patient walked from the sidewalk to their stretcher .

## 2019-04-03 NOTE — ED Provider Notes (Signed)
[facility]   CSN: [id number] Arrival date & time: 04/03/19  0708     History   Chief Complaint Chief Complaint  Patient presents with  . Foot Pain    HPI Bryan Farrell is a 68 y.o. male.     HPI Patient presents to the emergency department with bilateral foot pain that the patient states is been present for 6 weeks.  Patient states that he had his right great toe amputated and has been having pain in both feet since that time.  Patient states that his feet feel swollen.  Patient states that he has not taken any medications for this.  Patient states that he has not been seen for this recently.  Patient states that nothing seems to make the condition better but walking makes the pain worse. Past Medical History:  Diagnosis Date  . Diabetes mellitus without complication (HCC)   . Hypertension     Patient Active Problem List   Diagnosis Date Noted  . Moderate recurrent major depression (HCC)   . Adjustment disorder with depressed mood 04/13/2017  . Vitamin B12 deficiency 03/25/2017  . Vascular dementia (HCC) 03/23/2017  . Suicidal ideation 03/23/2017  . Homelessness 03/23/2017  . Diabetes (HCC) 03/23/2017    Past Surgical History:  Procedure Laterality Date  . TOE AMPUTATION    . TONSILLECTOMY          Home Medications    Prior to Admission medications   Not on File    Family History Family History  Family history unknown: Yes    Social History Social History   Tobacco Use  . Smoking status: Never Smoker  . Smokeless tobacco: Never Used  Substance Use Topics  . Alcohol use: No    Comment: BAC clear  . Drug use: No    Comment: UDS clear     Allergies   Patient has no known allergies.   Review of Systems Review of Systems  All other systems negative except as documented in the HPI. All pertinent positives and negatives as reviewed in the HPI. Physical Exam Updated Vital Signs BP 114/75  (BP Location: Right Arm)   Pulse 71   Temp 98.2 F (36.8 C) (Oral)   Resp 17   Ht 6' (1.829 m)   Wt 88.5 kg   SpO2 98%   BMI 26.45 kg/m   Physical Exam Vitals signs and nursing note reviewed.  Constitutional:      General: He is not in acute distress.    Appearance: He is well-developed.  HENT:     Head: Normocephalic and atraumatic.  Eyes:     Pupils: Pupils are equal, round, and reactive to light.  Pulmonary:     Effort: Pulmonary effort is normal.  Musculoskeletal:     Comments: Patient's feet do not have any wounds do not show any signs of cellulitis or edema.  Skin:    General: Skin is warm and dry.  Neurological:     Mental Status: He is alert and oriented to person, place, and time.      ED Treatments / Results  Labs (all labs ordered are listed, but only abnormal results are displayed) Labs Reviewed - No data to display  EKG None  Radiology No results found.  Procedures Procedures (including critical care time)  Medications Ordered in ED Medications - No data to display   Initial Impression / Assessment and Plan / ED Course  I have reviewed the triage  vital signs and the nursing notes.  Pertinent labs & imaging results that were available during my care of the patient were reviewed by me and considered in my medical decision making (see chart for details).        Patient will be treated for his pain and referred back to his doctor.  Told to return here as needed.  Advised the patient to rest and elevate his feet.  Final Clinical Impressions(s) / ED Diagnoses   Final diagnoses:  None    ED Discharge Orders    None       Rebeca Allegra 04/06/19 [place], Nathan, MD 04/10/19 [phone]

## 2019-04-03 NOTE — ED Notes (Signed)
Patient given son and daughter's number that were listed for emergency contact. Patient calling to see if he can get a ride. Patient informed that the bus was still no charge at this time. Patient voiced that he was not happy about riding a bus. Patient was also given an ice bag with ice in it for his feet as ordered.

## 2019-04-04 ENCOUNTER — Other Ambulatory Visit: Payer: Self-pay

## 2019-04-04 ENCOUNTER — Emergency Department (HOSPITAL_COMMUNITY)
Admission: EM | Admit: 2019-04-04 | Discharge: 2019-04-04 | Disposition: A | Discharge status: Home / Self Care | Payer: Medicare Other | Attending: Emergency Medicine | Admitting: Emergency Medicine

## 2019-04-04 DIAGNOSIS — E119 Type 2 diabetes mellitus without complications: Secondary | ICD-10-CM | POA: Insufficient documentation

## 2019-04-04 DIAGNOSIS — G8929 Other chronic pain: Secondary | ICD-10-CM | POA: Insufficient documentation

## 2019-04-04 DIAGNOSIS — I10 Essential (primary) hypertension: Secondary | ICD-10-CM | POA: Insufficient documentation

## 2019-04-04 DIAGNOSIS — Z7984 Long term (current) use of oral hypoglycemic drugs: Secondary | ICD-10-CM | POA: Diagnosis not present

## 2019-04-04 DIAGNOSIS — M79672 Pain in left foot: Secondary | ICD-10-CM | POA: Diagnosis not present

## 2019-04-04 DIAGNOSIS — M79671 Pain in right foot: Secondary | ICD-10-CM | POA: Diagnosis not present

## 2019-04-04 DIAGNOSIS — Z590 Homelessness: Secondary | ICD-10-CM | POA: Diagnosis not present

## 2019-04-04 NOTE — Discharge Planning (Signed)
[facility] consulted to advise pt how to get to [facility].  [facility] will remind pt of instructions given to him on 8/19.

## 2019-04-04 NOTE — ED Triage Notes (Signed)
Pt presents with PTAR from a motel that he was being asked to leave. Pt reports to EMS that he "[doesn't] know where to go, and [his] feet hurt." Reports 5/10 pain in bilat feet that began 3 mo ago when toe amputated, walks with a cane.  Takes metformin, but can't remember last time he took this.

## 2019-04-04 NOTE — Discharge Instructions (Addendum)
Recommend going to the interactive resource center as discussed with case management.  If you develop chest pain, difficulty breathing, abdominal pain, rash, swelling or fevers, recommend returning to the ER for reassessment.  Also recommend following up with your primary care doctor for recheck next week.

## 2019-04-04 NOTE — ED Provider Notes (Signed)
[facility]   CSN: [id number] Arrival date & time: 04/04/19  1151     History   Chief Complaint Chief Complaint  Patient presents with  . Foot Pain    HPI Bryan Farrell is a 68 y.o. male.  Presents emerged department with chief complaint of bilateral foot pain.  Patient states he did not call EMS squad but is unsure who did.  States that pain is currently 5 out of 10 in severity, both feet, does not give further description of quality, no alleviating or aggravating factors.  States that he has not taken any pain medicine today.  Patient was evaluated yesterday with similar complaint, patient reports symptoms have been going on for approximately 3 months and has had no change recently in his pain.  Denies rash, swelling, fever.  Has been able to ambulate without assistance.     HPI  Past Medical History:  Diagnosis Date  . Diabetes mellitus without complication (HCC)   . Hypertension     Patient Active Problem List   Diagnosis Date Noted  . Moderate recurrent major depression (HCC)   . Adjustment disorder with depressed mood 04/13/2017  . Vitamin B12 deficiency 03/25/2017  . Vascular dementia (HCC) 03/23/2017  . Suicidal ideation 03/23/2017  . Homelessness 03/23/2017  . Diabetes (HCC) 03/23/2017    Past Surgical History:  Procedure Laterality Date  . TOE AMPUTATION    . TONSILLECTOMY          Home Medications    Prior to Admission medications   Medication Sig Start Date End Date Taking? Authorizing Provider  metFORMIN (GLUCOPHAGE) 500 MG tablet Take 500 mg by mouth 2 (two) times daily. 11/25/18   [provider]    Family History Family History  Family history unknown: Yes    Social History Social History   Tobacco Use  . Smoking status: Never Smoker  . Smokeless tobacco: Never Used  Substance Use Topics  . Alcohol use: No    Comment: BAC clear  . Drug use: No    Comment: UDS clear      Allergies   Patient has no known allergies.   Review of Systems Review of Systems  Constitutional: Negative for chills and fever.  HENT: Negative for ear pain and sore throat.   Eyes: Negative for pain and visual disturbance.  Respiratory: Negative for cough and shortness of breath.   Cardiovascular: Negative for chest pain and palpitations.  Gastrointestinal: Negative for abdominal pain and vomiting.  Genitourinary: Negative for dysuria and hematuria.  Musculoskeletal: Negative for arthralgias and back pain.  Skin: Negative for color change and rash.  Neurological: Negative for seizures and syncope.  All other systems reviewed and are negative.    Physical Exam Updated Vital Signs BP 120/79   Pulse 65   Temp 98.6 F (37 C) (Oral)   Resp 17   Ht 6' (1.829 m)   Wt 88.4 kg   SpO2 96%   BMI 26.43 kg/m   Physical Exam Vitals signs and nursing note reviewed.  Constitutional:      Appearance: He is well-developed.     Comments: Somewhat disheveled  HENT:     Head: Normocephalic and atraumatic.  Eyes:     Conjunctiva/sclera: Conjunctivae normal.  Neck:     Musculoskeletal: Neck supple.  Cardiovascular:     Rate and Rhythm: Normal rate and regular rhythm.     Heart sounds: No murmur.  Pulmonary:  Effort: Pulmonary effort is normal. No respiratory distress.     Breath sounds: Normal breath sounds.  Abdominal:     Palpations: Abdomen is soft.     Tenderness: There is no abdominal tenderness.  Musculoskeletal:     Comments: LLE: no edema, deformity, foot and ankle with normal range of motion, no swelling, redness noted, distal cap refill and sensation intact, distal pulses intact RLE: no edema or deformity; s/p great toe amuptation, no swelling, redness noted throughout foot, ankle, distal cap refill and sensation intact, distal pulses intact  Skin:    General: Skin is warm and dry.  Neurological:     General: No focal deficit present.     Mental Status: He  is alert and oriented to person, place, and time.      ED Treatments / Results  Labs (all labs ordered are listed, but only abnormal results are displayed) Labs Reviewed - No data to display  EKG None  Radiology No results found.  Procedures Procedures (including critical care time)  Medications Ordered in ED Medications - No data to display   Initial Impression / Assessment and Plan / ED Course  I have reviewed the triage vital signs and the nursing notes.  Pertinent labs & imaging results that were available during my care of the patient were reviewed by me and considered in my medical decision making (see chart for details).       68 year old gentleman presented to the ER with a chief complaint of bilateral foot pain.  Chronic pain, no significant change by history.  On exam feet appear well, no deformity, swelling, erythema noted.  Patient ambulating.  Vitals stable.  Reviewed recent visits.  Doubt acute process today.  Patient states his primary concern was not knowing where to go.  I asked case management to evaluate patient.  They provided patient with resources, recommended going to interactive resource center which patient was agreeable.    After the discussed management above, the patient was determined to be safe for discharge.  The patient was in agreement with this plan and all questions regarding their care were answered.  ED return precautions were discussed and the patient will return to the ED with any significant worsening of condition.   Final Clinical Impressions(s) / ED Diagnoses   Final diagnoses:  Bilateral foot pain    ED Discharge Orders    None       Lucrezia Starch, MD 04/04/19 1410

## 2019-04-06 ENCOUNTER — Other Ambulatory Visit: Payer: Self-pay

## 2019-04-06 ENCOUNTER — Emergency Department (HOSPITAL_COMMUNITY)
Admission: EM | Admit: 2019-04-06 | Discharge: 2019-04-07 | Disposition: A | Discharge status: Home / Self Care | Payer: Medicare Other | Attending: Emergency Medicine | Admitting: Emergency Medicine

## 2019-04-06 DIAGNOSIS — I10 Essential (primary) hypertension: Secondary | ICD-10-CM | POA: Insufficient documentation

## 2019-04-06 DIAGNOSIS — Z7984 Long term (current) use of oral hypoglycemic drugs: Secondary | ICD-10-CM | POA: Insufficient documentation

## 2019-04-06 DIAGNOSIS — E119 Type 2 diabetes mellitus without complications: Secondary | ICD-10-CM | POA: Insufficient documentation

## 2019-04-06 DIAGNOSIS — G8929 Other chronic pain: Secondary | ICD-10-CM | POA: Diagnosis not present

## 2019-04-06 DIAGNOSIS — M79671 Pain in right foot: Secondary | ICD-10-CM | POA: Insufficient documentation

## 2019-04-06 DIAGNOSIS — M79673 Pain in unspecified foot: Secondary | ICD-10-CM

## 2019-04-06 DIAGNOSIS — M79672 Pain in left foot: Secondary | ICD-10-CM | POA: Insufficient documentation

## 2019-04-06 MED ORDER — IBUPROFEN 200 MG PO TABS
600.0000 mg | ORAL_TABLET | Freq: Once | ORAL | Status: AC
  Administered 2019-04-06: 23:00:00 600 mg via ORAL
  Filled 2019-04-06: qty 3

## 2019-04-06 NOTE — ED Triage Notes (Signed)
Per EMS - [place] with complaints of bilateral foot pain. EMS states that there is possible infection on left big toe. 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 Hx DM   130 80 90 HR 98% RA 18 R 97.3 CBG 160

## 2019-04-06 NOTE — ED Provider Notes (Signed)
[place] DEPT Provider Note   CSN: [id number] Arrival date & time: 04/06/19  2032     History   Chief Complaint Chief Complaint  Patient presents with  . Foot Pain    HPI Bryan Farrell is a 68 y.o. male.     HPI Patient presents to the emergency department with bilateral foot pain that the patient states is been ongoing for over a month now.  The patient states that he had his right great toe amputated it started after that time.  The patient states that he has no other complaints.  He states that he is unsure if he is on any medications for this.  Patient states that he does not have any wounds on his feet.  Patient states nothing makes the condition better or worse.  He states that he does walk and stand a lot.  The patient denies chest pain, shortness of breath, headache,blurred vision, neck pain, fever, cough, weakness, numbness, dizziness, anorexia, edema, abdominal pain, nausea, vomiting, diarrhea, rash, back pain, dysuria, hematemesis, bloody stool, near syncope, or syncope. Past Medical History:  Diagnosis Date  . Diabetes mellitus without complication ([place])   . Hypertension     Patient Active Problem List   Diagnosis Date Noted  . Moderate recurrent major depression ([place])   . Adjustment disorder with depressed mood 04/13/2017  . Vitamin B12 deficiency 03/25/2017  . Vascular dementia ([place]) 03/23/2017  . Suicidal ideation 03/23/2017  . Homelessness 03/23/2017  . Diabetes ([place]) 03/23/2017    Past Surgical History:  Procedure Laterality Date  . TOE AMPUTATION    . TONSILLECTOMY          Home Medications    Prior to Admission medications   Medication Sig Start Date End Date Taking? Authorizing Provider  metFORMIN (GLUCOPHAGE) 500 MG tablet Take 500 mg by mouth 2 (two) times daily. 11/25/18   [provider]    Family History Family History  Family history unknown: Yes    Social History Social History    Tobacco Use  . Smoking status: Never Smoker  . Smokeless tobacco: Never Used  Substance Use Topics  . Alcohol use: No    Comment: BAC clear  . Drug use: No    Comment: UDS clear     Allergies   Patient has no known allergies.   Review of Systems Review of Systems All other systems negative except as documented in the HPI. All pertinent positives and negatives as reviewed in the HPI.  Physical Exam Updated Vital Signs BP 134/83 (BP Location: Right Arm)   Pulse 77   Temp 98.5 F (36.9 C) (Oral)   Resp 18   Ht 6' (1.829 m)   Wt 88.5 kg   SpO2 98% Comment: Simultaneous filing. User may not have seen previous data.  BMI 26.45 kg/m   Physical Exam Vitals signs and nursing note reviewed.  Constitutional:      General: He is not in acute distress.    Appearance: He is well-developed.  HENT:     Head: Normocephalic and atraumatic.  Eyes:     Pupils: Pupils are equal, round, and reactive to light.  Pulmonary:     Effort: Pulmonary effort is normal.  Musculoskeletal:       Feet:  Skin:    General: Skin is warm and dry.  Neurological:     Mental Status: He is alert and oriented to person, place, and time.      ED Treatments /  Results  Labs (all labs ordered are listed, but only abnormal results are displayed) Labs Reviewed - No data to display  EKG None  Radiology No results found.  Procedures Procedures (including critical care time)  Medications Ordered in ED Medications  ibuprofen (ADVIL) tablet 600 mg (600 mg Oral Given 04/06/19 2318)     Initial Impression / Assessment and Plan / ED Course  I have reviewed the triage vital signs and the nursing notes.  Pertinent labs & imaging results that were available during my care of the patient were reviewed by me and considered in my medical decision making (see chart for details).        Patient has chronic foot pain which I feel is known to be very well controlled here in the emergency department.   I [facility] the [facility]. Final Clinical Impressions(s) / ED Diagnoses   Final diagnoses:  None    ED Discharge Orders    None       Charlestine NightLawyer, Bud Kaeser, PA-C 04/06/19 2345    Samuel JesterMcManus, Kathleen, DO 04/10/19 [phone]

## 2019-04-07 ENCOUNTER — Encounter (HOSPITAL_COMMUNITY): Payer: Self-pay | Admitting: Emergency Medicine

## 2019-04-07 ENCOUNTER — Emergency Department (HOSPITAL_COMMUNITY)
Admission: EM | Admit: 2019-04-07 | Discharge: 2019-04-07 | Disposition: A | Discharge status: Home / Self Care | Payer: Medicare Other | Source: Home / Self Care | Attending: Emergency Medicine | Admitting: Emergency Medicine

## 2019-04-07 ENCOUNTER — Other Ambulatory Visit: Payer: Self-pay

## 2019-04-07 DIAGNOSIS — M79672 Pain in left foot: Secondary | ICD-10-CM | POA: Insufficient documentation

## 2019-04-07 DIAGNOSIS — E119 Type 2 diabetes mellitus without complications: Secondary | ICD-10-CM | POA: Insufficient documentation

## 2019-04-07 DIAGNOSIS — Z7984 Long term (current) use of oral hypoglycemic drugs: Secondary | ICD-10-CM | POA: Insufficient documentation

## 2019-04-07 DIAGNOSIS — M79671 Pain in right foot: Secondary | ICD-10-CM | POA: Insufficient documentation

## 2019-04-07 DIAGNOSIS — I10 Essential (primary) hypertension: Secondary | ICD-10-CM | POA: Insufficient documentation

## 2019-04-07 MED ORDER — TRAMADOL HCL 50 MG PO TABS
50.0000 mg | ORAL_TABLET | Freq: Once | ORAL | Status: AC
  Administered 2019-04-07: 08:00:00 50 mg via ORAL
  Filled 2019-04-07: qty 1

## 2019-04-07 NOTE — ED Provider Notes (Signed)
[facility] Note   CSN: [id number] Arrival date & time: 04/07/19  [id number]     History   Chief Complaint Chief Complaint  Patient presents with  . Foot Pain    HPI Bryan Farrell is a 68 y.o. male with past medical history of diabetes and hypertension presents emergency department today with chief complaint of bilateral foot pain, worse on the right.  He was seen yesterday for the same, as well as 2 other times in the last 4 days . His pain has been present x 3 months now. No recent changes in pain. He ambulates without assistance. He will not describe the pain in more detail. He does say pain is worse with walking and that his feet are his primary mode of transportation. He has not taken any medication for pain prior to arrival. Pt has history of suicidal ideations, he denies any today. He also denies fever, chills, shortness of breath, numbness, weakness, rash.  Pt had right great toe amputated recently. He does not remember when exactly. History provided by patient with additional history obtained from chart review.       Past Medical History:  Diagnosis Date  . Diabetes mellitus without complication (HCC)   . Hypertension     Patient Active Problem List   Diagnosis Date Noted  . Moderate recurrent major depression (HCC)   . Adjustment disorder with depressed mood 04/13/2017  . Vitamin B12 deficiency 03/25/2017  . Vascular dementia (HCC) 03/23/2017  . Suicidal ideation 03/23/2017  . Homelessness 03/23/2017  . Diabetes (HCC) 03/23/2017    Past Surgical History:  Procedure Laterality Date  . TOE AMPUTATION    . TONSILLECTOMY          Home Medications    Prior to Admission medications   Medication Sig Start Date End Date Taking? Authorizing Provider  metFORMIN (GLUCOPHAGE) 500 MG tablet Take 500 mg by mouth 2 (two) times daily. 11/25/18   [provider]    Family History Family History  Family history  unknown: Yes    Social History Social History   Tobacco Use  . Smoking status: Never Smoker  . Smokeless tobacco: Never Used  Substance Use Topics  . Alcohol use: No    Comment: BAC clear  . Drug use: No    Comment: UDS clear     Allergies   Patient has no known allergies.   Review of Systems Review of Systems  Constitutional: Negative for chills and fever.  Gastrointestinal: Negative for abdominal pain, nausea and vomiting.  Musculoskeletal: Positive for arthralgias. Negative for gait problem and joint swelling.  Skin: Negative for color change, rash and wound.  Neurological: Negative for weakness and numbness.     Physical Exam Updated Vital Signs BP 124/85 (BP Location: Left Arm)   Pulse 69   Temp 98.1 F (36.7 C) (Oral)   Resp 17   SpO2 98%   Physical Exam Vitals signs and nursing note reviewed.  Constitutional:      Appearance: He is well-developed. He is not ill-appearing or toxic-appearing.     Comments: Pt appears disheveled. Ambulates with steady gait  HENT:     Head: Normocephalic and atraumatic.     Nose: Nose normal.  Eyes:     General: No scleral icterus.       Right eye: No discharge.        Left eye: No discharge.     Conjunctiva/sclera: Conjunctivae normal.  Neck:  Musculoskeletal: Normal range of motion.     Vascular: No JVD.  Cardiovascular:     Rate and Rhythm: Normal rate and regular rhythm.     Pulses: Normal pulses.          Dorsalis pedis pulses are 2+ on the right side and 2+ on the left side.     Heart sounds: Normal heart sounds.  Pulmonary:     Effort: Pulmonary effort is normal.     Breath sounds: Normal breath sounds.  Abdominal:     General: There is no distension.  Musculoskeletal: Normal range of motion.  Feet:     Comments: No wounds seen on bilateral feet. No signs of infection, no erythema or warmth. No swelling. No rash or skin breakdown.  Right great toe amputation appears to be well healed. Skin:     General: Skin is warm and dry.  Neurological:     Mental Status: He is oriented to person, place, and time.     GCS: GCS eye subscore is 4. GCS verbal subscore is 5. GCS motor subscore is 6.     Comments: Fluent speech, no facial droop.  Psychiatric:        Behavior: Behavior normal.      ED Treatments / Results  Labs (all labs ordered are listed, but only abnormal results are displayed) Labs Reviewed - No data to display  EKG None  Radiology No results found.  Procedures Procedures (including critical care time)  Medications Ordered in ED Medications  traMADol (ULTRAM) tablet 50 mg (50 mg Oral Given 04/07/19 0824)     Initial Impression / Assessment and Plan / ED Course  I have reviewed the triage vital signs and the nursing notes.  Pertinent labs & imaging results that were available during my care of the patient were reviewed by me and considered in my medical decision making (see chart for details).   Pt is nontoxic in appearance. He presents with chronic bilateral foot pain unchanged today. He ambulates with steady gait. No open wounds or deformity to bilateral feet. His vitals are stable. Tramadol given. Doubt need for further emergent work up at this time. I explained the diagnosis and have given explicit precautions to return to the ER including for any other new or worsening symptoms. The patient understands and accepts the medical plan as it's been dictated and I have answered their questions. Discharge instructions concerning home care. The patient is stable and is discharged to home in good condition. He was given resources for [facility] at recent visit. Recommend with pt again to follow up there.   This note was prepared using Dragon voice recognition software and may include unintentional dictation errors due to the inherent limitations of voice recognition software.    Final Clinical Impressions(s) / ED Diagnoses   Final diagnoses:  Bilateral foot pain    ED  Discharge Orders    None       Flint Melter 04/07/19 2009    Virgel Manifold, MD 04/10/19 1139

## 2019-04-07 NOTE — Discharge Instructions (Addendum)
Return here as needed. Follow up with your doctor. °

## 2019-04-07 NOTE — ED Triage Notes (Signed)
Pt reports having bilateral foot pain that is 5/10 at this time. Pt was seen earlier for same.

## 2019-04-07 NOTE — Discharge Instructions (Addendum)
You cn take tylenol as needed for pain  Please follow up with:  "[facility]" [place]  [address], [place], [place]  Phone number is: [phone]   There does not appear to be signs of infection of your feet. The pain is a chronic problem and should be followed up with by your primary care doctor. Return to the emergency department for any new or worsening pain and symptoms.

## 2019-04-14 ENCOUNTER — Emergency Department (HOSPITAL_COMMUNITY)
Admission: EM | Admit: 2019-04-14 | Discharge: 2019-04-15 | Disposition: A | Discharge status: Home / Self Care | Payer: Medicare Other | Attending: Emergency Medicine | Admitting: Emergency Medicine

## 2019-04-14 ENCOUNTER — Other Ambulatory Visit: Payer: Self-pay

## 2019-04-14 ENCOUNTER — Encounter (HOSPITAL_COMMUNITY): Payer: Self-pay

## 2019-04-14 DIAGNOSIS — Z7984 Long term (current) use of oral hypoglycemic drugs: Secondary | ICD-10-CM | POA: Diagnosis not present

## 2019-04-14 DIAGNOSIS — E119 Type 2 diabetes mellitus without complications: Secondary | ICD-10-CM | POA: Insufficient documentation

## 2019-04-14 DIAGNOSIS — F4321 Adjustment disorder with depressed mood: Secondary | ICD-10-CM | POA: Diagnosis not present

## 2019-04-14 DIAGNOSIS — I10 Essential (primary) hypertension: Secondary | ICD-10-CM | POA: Insufficient documentation

## 2019-04-14 DIAGNOSIS — R45851 Suicidal ideations: Secondary | ICD-10-CM | POA: Diagnosis not present

## 2019-04-14 DIAGNOSIS — F332 Major depressive disorder, recurrent severe without psychotic features: Secondary | ICD-10-CM | POA: Diagnosis not present

## 2019-04-14 DIAGNOSIS — Z5900 Homelessness unspecified: Secondary | ICD-10-CM

## 2019-04-14 DIAGNOSIS — F0150 Vascular dementia without behavioral disturbance: Secondary | ICD-10-CM | POA: Diagnosis present

## 2019-04-14 DIAGNOSIS — F329 Major depressive disorder, single episode, unspecified: Secondary | ICD-10-CM | POA: Diagnosis present

## 2019-04-14 LAB — RAPID URINE DRUG SCREEN, HOSP PERFORMED
Amphetamines: NOT DETECTED
Barbiturates: NOT DETECTED
Benzodiazepines: NOT DETECTED
Cocaine: NOT DETECTED
Opiates: NOT DETECTED
Tetrahydrocannabinol: NOT DETECTED

## 2019-04-14 LAB — CBC WITH DIFFERENTIAL/PLATELET
Abs Immature Granulocytes: 0.02 10*3/uL (ref 0.00–0.07)
Basophils Absolute: 0.1 10*3/uL (ref 0.0–0.1)
Basophils Relative: 1 %
Eosinophils Absolute: 0.2 10*3/uL (ref 0.0–0.5)
Eosinophils Relative: 2 %
HCT: 40.2 % (ref 39.0–52.0)
Hemoglobin: 13 g/dL (ref 13.0–17.0)
Immature Granulocytes: 0 %
Lymphocytes Relative: 17 %
Lymphs Abs: 1.7 10*3/uL (ref 0.7–4.0)
MCH: 30 pg (ref 26.0–34.0)
MCHC: 32.3 g/dL (ref 30.0–36.0)
MCV: 92.8 fL (ref 80.0–100.0)
Monocytes Absolute: 0.7 10*3/uL (ref 0.1–1.0)
Monocytes Relative: 7 %
Neutro Abs: 7.5 10*3/uL (ref 1.7–7.7)
Neutrophils Relative %: 73 %
Platelets: 330 10*3/uL (ref 150–400)
RBC: 4.33 MIL/uL (ref 4.22–5.81)
RDW: 13.3 % (ref 11.5–15.5)
WBC: 10.3 10*3/uL (ref 4.0–10.5)
nRBC: 0 % (ref 0.0–0.2)

## 2019-04-14 LAB — BASIC METABOLIC PANEL
Anion gap: 9 (ref 5–15)
BUN: 17 mg/dL (ref 8–23)
CO2: 26 mmol/L (ref 22–32)
Calcium: 9.1 mg/dL (ref 8.9–10.3)
Chloride: 104 mmol/L (ref 98–111)
Creatinine, Ser: 0.87 mg/dL (ref 0.61–1.24)
GFR calc Af Amer: >60 mL/min (ref >60)
GFR calc non Af Amer: >60 mL/min (ref >60)
Glucose, Bld: 197 mg/dL — ABNORMAL HIGH (ref 70–99)
Potassium: 3.8 mmol/L (ref 3.5–5.1)
Sodium: 139 mmol/L (ref 135–145)

## 2019-04-14 LAB — ACETAMINOPHEN LEVEL: Acetaminophen (Tylenol), Serum: <10 ug/mL — ABNORMAL LOW (ref 10–30)

## 2019-04-14 LAB — SALICYLATE LEVEL: Salicylate Lvl: <7 mg/dL (ref 2.8–30.0)

## 2019-04-14 LAB — ETHANOL: Alcohol, Ethyl (B): <10 mg/dL (ref <10)

## 2019-04-14 NOTE — ED Triage Notes (Signed)
Per EMS- Patient was picked up from [facility]. Patient vomited on the sidewalk and staff called EMS. When EMS arrived the patient stated that he was suicidal.  Staff at [facility] stated that the patient had an appointment with Social Services and he is suppose to become a ward of the state today. [facility] staff also reported that the patient has been having odd behavior by urinating on the sidewalk etc.

## 2019-04-14 NOTE — ED Notes (Signed)
Specimen cup provided, pt encouraged to provide urine per MD order.  

## 2019-04-14 NOTE — ED Notes (Signed)
Pt wanded and belongings taken to TCU and placed in locker in [place]

## 2019-04-14 NOTE — BH Assessment (Signed)
Assessment Note  Bryan Farrell is an 68 y.o. male that presents this date voluntary with S/I. Patient voices intent although does not verbalize a plan. Patient denies any H/I or AVH. Patient renders conflicting history as patient stated he had a plan earlier this date to run into traffic on admission Patient is well known to ED and was last seen on 04/02/19 when he presented with similar symptoms. Patient states he is currently homeless and "just is done with it all." Patient will not elaborate on content of statement and renders limited history mostly answering "yes" and "no." Patient is vague in reference to symptoms stating "he is just depressed." Patient states he is currently not receiving any OP services at this time. Patient has a history of alcohol use per chart review although states he has been maintaining his sobriety "for years."  UDS is pending with patient denying the use of any other substances. Per notes patient reports that he is feeling suicidal. He has a tentative plan to jump in front of traffic. Patient reports a longstanding history of feeling suicidal. He reports that his symptoms are worse today. Per history, patient has a diagnosis of vascular dementia (as evidenced by CAT scan).  Cognitive deficiency was apparent. [facility] and staff there called EMS who transported him to the ED after patient made statements of self harm. During assessment, Pt presented as alert and oriented x4. He had fair eye contact and was cooperative. He was dressed in scrubs and appeared disheveled. Patient's  mood was depressed.  Affect was ambivalent.  Patient could not identify any particular symptom, but within the last week, he endorsed despondency. Patient's speech was normal in rate, rhythm, and volume. Patient's  thought processes were partially impaired. Patient's  thought content indicated memory difficulties. Memory and concentration were poor. Impulse control,  judgment, and insight were fair to poor. Case was staffed with Sharma CovertNorman DO who recommended patient be observed and monitored. Patient will be seen by psychiatry in the a.m.   Diagnosis: F33.2 MDD recurrent without psychotic features, severe,  vascular dementia  Past Medical History:  Past Medical History:  Diagnosis Date  . Diabetes mellitus without complication (HCC)   . Hypertension     Past Surgical History:  Procedure Laterality Date  . TOE AMPUTATION    . TONSILLECTOMY      Family History:  Family History  Family history unknown: Yes    Social History:  reports that he has never smoked. He has never used smokeless tobacco. He reports that he does not drink alcohol or use drugs.  Additional Social History:  Alcohol / Drug Use Pain Medications: See MAR Prescriptions: See MAR Over the Counter: See MAR History of alcohol / drug use?: Yes Longest period of sobriety (when/how long): Unknown Negative Consequences of Use: (Denies) Withdrawal Symptoms: (Denies) Substance #1 Name of Substance 1: Alcohol per hx 1 - Age of First Use: Unknown 1 - Amount (size/oz): Varies 1 - Frequency: Varies 1 - Duration: Ongoing 1 - Last Use / Amount: Pt cannot recall last use  CIWA: CIWA-Ar BP: 101/69 Pulse Rate: (!) 57 COWS:    Allergies: No Known Allergies  Home Medications: (Not in a hospital admission)   OB/GYN Status:  No LMP for male patient.  General Assessment Data Location of Assessment: [facility] TTS Assessment: In system Is this a Tele or Face-to-Face Assessment?: Face-to-Face Is this an Initial Assessment or a Re-assessment for this encounter?:  Initial Assessment Patient Accompanied by:: N/A Language Other than English: No Living Arrangements: Homeless/Shelter What gender do you identify as?: Male Marital status: Divorced Pregnancy Status: No Living Arrangements: Alone Can pt return to current living arrangement?: Yes Admission Status: Voluntary Is patient capable of  signing voluntary admission?: Yes Referral Source: Self/Family/Friend Insurance type: Medicare     Crisis Care Plan Living Arrangements: Alone Legal Guardian: (NA) Name of Psychiatrist: None Name of Therapist: None  Education Status Is patient currently in school?: No Is the patient employed, unemployed or receiving disability?: Unemployed  Risk to self with the past 6 months Suicidal Ideation: Yes-Currently Present Has patient been a risk to self within the past 6 months prior to admission? : Yes Suicidal Intent: No Has patient had any suicidal intent within the past 6 months prior to admission? : Yes Is patient at risk for suicide?: No Suicidal Plan?: No Has patient had any suicidal plan within the past 6 months prior to admission? : No Access to Means: No What has been your use of drugs/alcohol within the last 12 months?: Dnies current use Previous Attempts/Gestures: Yes How many times?: 1 Other Self Harm Risks: (Homeless) Triggers for Past Attempts: Unknown Intentional Self Injurious Behavior: None Family Suicide History: No Recent stressful life event(s): Other (Comment)(Homeless) Persecutory voices/beliefs?: No Depression: No Depression Symptoms: Feeling worthless/self pity Substance abuse history and/or treatment for substance abuse?: No Suicide prevention information given to non-admitted patients: Not applicable  Risk to Others within the past 6 months Homicidal Ideation: No Does patient have any lifetime risk of violence toward others beyond the six months prior to admission? : No Thoughts of Harm to Others: No Current Homicidal Intent: No Current Homicidal Plan: No Access to Homicidal Means: No Identified Victim: NA History of harm to others?: No Assessment of Violence: None Noted Violent Behavior Description: NA Does patient have access to weapons?: No Criminal Charges Pending?: No Does patient have a court date: No Is patient on probation?:  No  Psychosis Hallucinations: None noted Delusions: None noted  Mental Status Report Appearance/Hygiene: In scrubs Eye Contact: Fair Motor Activity: Freedom of movement Speech: Slow Level of Consciousness: Quiet/awake Mood: Depressed Affect: Appropriate to circumstance Anxiety Level: None Thought Processes: Circumstantial Judgement: Partial Orientation: Person, Place, Time Obsessive Compulsive Thoughts/Behaviors: None  Cognitive Functioning Concentration: Normal Memory: Recent Intact, Remote Intact Is patient IDD: No Insight: Fair Impulse Control: Fair Appetite: Good Have you had any weight changes? : No Change Sleep: No Change Total Hours of Sleep: 7 Vegetative Symptoms: None  ADLScreening [facility]) Patient's cognitive ability adequate to safely complete daily activities?: Yes Patient able to express need for assistance with ADLs?: Yes Independently performs ADLs?: Yes (appropriate for developmental age)  Prior Inpatient Therapy Prior Inpatient Therapy: Yes Prior Therapy Dates: Multiple Prior Therapy Facilty/Provider(s): Multiple Reason for Treatment: MH issues  Prior Outpatient Therapy Prior Outpatient Therapy: No Does patient have an ACCT team?: No Does patient have Intensive In-House Services?  : No Does patient have [facility] services? : No Does patient have P4CC services?: No  ADL Screening (condition at time of admission) Patient's cognitive ability adequate to safely complete daily activities?: Yes Is the patient deaf or have difficulty hearing?: No Does the patient have difficulty seeing, even when wearing glasses/contacts?: No Does the patient have difficulty concentrating, remembering, or making decisions?: No Patient able to express need for assistance with ADLs?: Yes Does the patient have difficulty dressing or bathing?: No Independently performs ADLs?: Yes (appropriate for developmental age)  Does the patient have difficulty walking  or climbing stairs?: No Weakness of Legs: None Weakness of Arms/Hands: None  Home Assistive Devices/Equipment Home Assistive Devices/Equipment: None  Therapy Consults (therapy consults require a physician order) PT Evaluation Needed: No OT Evalulation Needed: No SLP Evaluation Needed: No Abuse/Neglect Assessment (Assessment to be complete while patient is alone) Physical Abuse: Denies Verbal Abuse: Denies Sexual Abuse: Denies Exploitation of patient/patient's resources: Denies Self-Neglect: Denies Values / Beliefs Cultural Requests During Hospitalization: None Spiritual Requests During Hospitalization: None Consults Spiritual Care Consult Needed: No Social Work Consult Needed: No Regulatory affairs officer (For Healthcare) Does Patient Have a Medical Advance Directive?: No Would patient like information on creating a medical advance directive?: No - Patient declined          Disposition: Case was staffed with Mariea Clonts DO who recommended patient be observed and monitored. Patient will be seen by psychiatry in the a.m. Disposition Initial Assessment Completed for this Encounter: Yes Disposition of Patient: (Observe and monitor) Patient refused recommended treatment: No Mode of transportation if patient is discharged/movement?: Tomasita Crumble)  On Site Evaluation by:   Reviewed with Physician:    Mamie Nick 04/14/2019 3:03 PM

## 2019-04-14 NOTE — BH Assessment (Signed)
[facility] Assessment Progress Note   Case was staffed with Mariea Clonts DO who recommended patient be observed and monitored. Patient will be seen by psychiatry in the a.m.

## 2019-04-14 NOTE — ED Notes (Signed)
Pt is well aware that MD needs UA sample. Was not able to provide at the moment. Attempted.

## 2019-04-14 NOTE — ED Triage Notes (Signed)
Patient states he is suicidal and his plan is to jump out in front of traffic.

## 2019-04-14 NOTE — ED Provider Notes (Signed)
[facility]-EMERGENCY DEPT Provider Note   CSN: [id number] Arrival date & time: 04/14/19  1016     History   Chief Complaint Chief Complaint  Patient presents with   Suicidal   Emesis    HPI Bryan Farrell is a 68 y.o. male.     68 year old male with prior medical history as detailed below presents for evaluation of reported suicidality.  Patient reports that he is feeling suicidal.  He has a tentative plan to jump in front of traffic.  Patient reports a longstanding history of feeling suicidal.  He reports that his symptoms are worse today.  [facility] and staff there then called EMS who transported him to the ED.  He is otherwise without specific complaint at this time.  The history is provided by the patient and medical records.  Mental Health Problem Presenting symptoms: suicidal thoughts   Degree of incapacity (severity):  Mild Onset quality:  Unable to specify Timing:  Constant Progression:  Worsening Chronicity:  Recurrent Relieved by:  Nothing Worsened by:  Nothing   Past Medical History:  Diagnosis Date   Diabetes mellitus without complication (HCC)    Hypertension     Patient Active Problem List   Diagnosis Date Noted   Moderate recurrent major depression (HCC)    Adjustment disorder with depressed mood 04/13/2017   Vitamin B12 deficiency 03/25/2017   Vascular dementia (HCC) 03/23/2017   Suicidal ideation 03/23/2017   Homelessness 03/23/2017   Diabetes (HCC) 03/23/2017    Past Surgical History:  Procedure Laterality Date   TOE AMPUTATION     TONSILLECTOMY          Home Medications    Prior to Admission medications   Medication Sig Start Date End Date Taking? Authorizing Provider  metFORMIN (GLUCOPHAGE) 500 MG tablet Take 500 mg by mouth 2 (two) times daily. 11/25/18   [provider]    Family History Family History  Family history unknown: Yes     Social History Social History   Tobacco Use   Smoking status: Never Smoker   Smokeless tobacco: Never Used  Substance Use Topics   Alcohol use: No    Comment: BAC clear   Drug use: No    Comment: UDS clear     Allergies   Patient has no known allergies.   Review of Systems Review of Systems  Psychiatric/Behavioral: Positive for suicidal ideas.  All other systems reviewed and are negative.    Physical Exam Updated Vital Signs BP 121/82 (BP Location: Right Arm)    Pulse 60    Temp 97.9 F (36.6 C) (Oral)    Resp 14    Ht 6' (1.829 m)    Wt 88.5 kg    SpO2 98%    BMI 26.45 kg/m   Physical Exam Vitals signs and nursing note reviewed.  Constitutional:      General: He is not in acute distress.    Appearance: Normal appearance. He is well-developed.  HENT:     Head: Normocephalic and atraumatic.  Eyes:     Conjunctiva/sclera: Conjunctivae normal.     Pupils: Pupils are equal, round, and reactive to light.  Neck:     Musculoskeletal: Normal range of motion and neck supple.  Cardiovascular:     Rate and Rhythm: Normal rate and regular rhythm.     Heart sounds: Normal heart sounds.  Pulmonary:     Effort: Pulmonary effort is normal. No  respiratory distress.     Breath sounds: Normal breath sounds.  Abdominal:     General: There is no distension.     Palpations: Abdomen is soft.     Tenderness: There is no abdominal tenderness.  Musculoskeletal: Normal range of motion.        General: No deformity.  Skin:    General: Skin is warm and dry.  Neurological:     General: No focal deficit present.     Mental Status: He is alert and oriented to person, place, and time. Mental status is at baseline.  Psychiatric:     Comments: Reports being suicidal with plan to jump in front of traffic       ED Treatments / Results  Labs (all labs ordered are listed, but only abnormal results are displayed) Labs Reviewed  ACETAMINOPHEN LEVEL - Abnormal; Notable for the  following components:      Result Value   Acetaminophen (Tylenol), Serum <10 (*)    All other components within normal limits  BASIC METABOLIC PANEL - Abnormal; Notable for the following components:   Glucose, Bld 197 (*)    All other components within normal limits  ETHANOL  CBC WITH DIFFERENTIAL/PLATELET  SALICYLATE LEVEL  RAPID URINE DRUG SCREEN, HOSP PERFORMED    EKG None  Radiology No results found.  Procedures Procedures (including critical care time)  Medications Ordered in ED Medications - No data to display   Initial Impression / Assessment and Plan / ED Course  I have reviewed the triage vital signs and the nursing notes.  Pertinent labs & imaging results that were available during my care of the patient were reviewed by me and considered in my medical decision making (see chart for details).       MDM  Screen complete  Bryan Farrell was evaluated in Emergency Department on 04/14/2019 for the symptoms described in the history of present illness. He was evaluated in the context of the global COVID-19 pandemic, which necessitated consideration that the patient might be at risk for infection with the SARS-CoV-2 virus that causes COVID-19. Institutional protocols and algorithms that pertain to the evaluation of patients at risk for COVID-19 are in a state of rapid change based on information released by regulatory bodies including the CDC and federal and state organizations. These policies and algorithms were followed during the patient's care in the ED.  Patient is presenting for evaluation of reported suicidal ideation.  Patient is medically clear at this time for further psychiatric evaluation and treatment.  Final disposition will depend upon psychiatric assessment and treatment plan.   Final Clinical Impressions(s) / ED Diagnoses   Final diagnoses:  Suicidal ideation    ED Discharge Orders    None       Valarie Merino, MD 04/14/19 [id number]

## 2019-04-14 NOTE — ED Notes (Addendum)
Pt  Has been seen and wand by security.  Pt has 1 bag of belongings and 1 blue backpack.

## 2019-04-15 ENCOUNTER — Encounter (HOSPITAL_COMMUNITY): Payer: Self-pay | Admitting: Registered Nurse

## 2019-04-15 ENCOUNTER — Emergency Department (HOSPITAL_COMMUNITY)
Admission: EM | Admit: 2019-04-15 | Discharge: 2019-04-16 | Discharge status: Against Medical Advice | Payer: Medicare Other | Source: Home / Self Care

## 2019-04-15 ENCOUNTER — Encounter (HOSPITAL_COMMUNITY): Payer: Self-pay | Admitting: Family Medicine

## 2019-04-15 DIAGNOSIS — F332 Major depressive disorder, recurrent severe without psychotic features: Secondary | ICD-10-CM | POA: Diagnosis not present

## 2019-04-15 DIAGNOSIS — F4321 Adjustment disorder with depressed mood: Secondary | ICD-10-CM

## 2019-04-15 NOTE — Consult Note (Addendum)
[facility]  04/15/2019 12:33 PM Bryan OysterDonald Lewis Farrell  MRN:  [id number] Principal Problem: Adjustment disorder with depressed mood Discharge Diagnoses: Principal Problem:   Adjustment disorder with depressed mood Active Problems:   Vascular dementia [facility](HCC)   Homelessness   Subjective: Bryan Oysteronald Lewis Farrell, 68 y.o., male patient seen via tele psych by this provider, Dr. Sharma CovertNorman; and chart reviewed on 04/15/19.  On evaluation Bryan Farrell reports he does not know why he is in the hospital.  States that he is homeless and that he has 2 children but doesn't know if they know he is homeless or how to get in contact with them; but states that their contact information should be in his emergency contact information (face sheet).  Patient states that he was taking medication at one time but unsure what it was for "I was taking 4 or 5 pills for something; but I don't know what; can't remember the names." Patient was able to tell what year, date of birth, president, current season, and where he was at this time; but memory seemed to be more affected with recent or immediate history.  Patient denies suicidal/self-harm/homicidal ideation, psychosis, and paranoia.  Does report that he has attempted suicide in the past once by trying to hang himself but doesn't know how long ago (records noted 2011).  Last psychiatric admission was 03/2017 for depression ans was started on Prozac.  Depressed about living situation but doesn't want to kill himself.   During evaluation Bryan Farrell is alert/oriented x 3; calm/cooperative; and mood is congruent with affect.  He does not appear to be responding to internal/external stimuli or delusional thoughts.  Patient denies suicidal/self-harm/homicidal ideation, psychosis, and paranoia.  Patient answered question appropriately.     Total Time spent with patient: 30 minutes  Past Psychiatric History: Depression, prior SA 2011  Past Medical History:  Past Medical  History:  Diagnosis Date  . Diabetes mellitus without complication (HCC)   . Hypertension     Past Surgical History:  Procedure Laterality Date  . TOE AMPUTATION    . TONSILLECTOMY     Family History:  Family History  Family history unknown: Yes   Family Psychiatric  History: Denies  Social History:  Social History   Substance and Sexual Activity  Alcohol Use No   Comment: BAC clear     Social History   Substance and Sexual Activity  Drug Use No   Comment: UDS clear    Social History   Socioeconomic History  . Marital status: Single    Spouse name: Not on file  . Number of children: Not on file  . Years of education: Not on file  . Highest education level: Not on file  Occupational History  . Not on file  Social Needs  . Financial resource strain: Not on file  . Food insecurity    Worry: Not on file    Inability: Not on file  . Transportation needs    Medical: Not on file    Non-medical: Not on file  Tobacco Use  . Smoking status: Never Smoker  . Smokeless tobacco: Never Used  Substance and Sexual Activity  . Alcohol use: No    Comment: BAC clear  . Drug use: No    Comment: UDS clear  . Sexual activity: Never  Lifestyle  . Physical activity    Days per week: Not on file    Minutes per session: Not on file  . Stress: Not on  file  Relationships  . Social Musician on phone: Not on file    Gets together: Not on file    Attends religious service: Not on file    Active member of club or organization: Not on file    Attends meetings of clubs or organizations: Not on file    Relationship status: Not on file  Other Topics Concern  . Not on file  Social History Narrative  . Not on file    Has this patient used any form of tobacco in the last 30 days? (Cigarettes, Smokeless Tobacco, Cigars, and/or Pipes) Prescription not provided because: Patient does not use tobacco products  Current Medications: No current facility-administered  medications for this encounter.    No current outpatient medications on file.   PTA Medications: (Not in a hospital admission)   Musculoskeletal: Strength & Muscle Tone: within normal limits Gait & Station: normal Patient leans: N/A  Psychiatric Specialty Exam: Physical Exam  Nursing note and vitals reviewed. HENT:  Head: Normocephalic and atraumatic.  Neck: Normal range of motion.  Respiratory: Effort normal.  Musculoskeletal: Normal range of motion.  Neurological: He is alert.  Oriented to person, place and year.  Psychiatric: He has a normal mood and affect. His speech is normal and behavior is normal. Judgment and thought content normal. Cognition and memory are impaired.  History of vascular dementia.      Review of Systems  Psychiatric/Behavioral: Positive for memory loss (Would answer "I don't know" to questions related to medications, why in hospital, and last time spoke with sons" ). Depression: Stable. Hallucinations: Denies. Substance abuse: Denies. Suicidal ideas: Denies. Nervous/anxious: Stable. Insomnia: Denies.   All other systems reviewed and are negative.   Blood pressure 113/75, pulse (!) 54, temperature 98 F (36.7 C), temperature source Oral, resp. rate 16, height 6' (1.829 m), weight 88.5 kg, SpO2 98 %.Body mass index is 26.45 kg/m.  General Appearance: Casual  Eye Contact:  Good  Speech:  Clear and Coherent and Normal Rate  Volume:  Normal  Mood:  Appropriate  Affect:  Appropriate and Congruent  Thought Process:  Coherent  Orientation:  Full (Time, Place, and Person)  Thought Content:  WDL and Denies hallucinations, delusions, and paranoia  Suicidal Thoughts:  No  Homicidal Thoughts:  No  Memory:  Immediate;   Poor Recent;   Poor Remote;   Fair  Judgement:  Fair  Insight:  Fair  Psychomotor Activity:  Normal  Concentration:  Concentration: Good and Attention Span: Good  Recall:  Poor  Fund of Knowledge:  Fair  Language:  Good  Akathisia:  No   Handed:  Right  AIMS (if indicated):   N/A  Assets:  Communication Skills Desire for Improvement  ADL's:  Intact  Cognition:  WNL except for memory  Sleep:   N/A     Demographic Factors:  Male, Age 63 or older, Caucasian and Living alone  Loss Factors: Homeless  Historical Factors: NA  Risk Reduction Factors:   Religious beliefs about death  Continued Clinical Symptoms:  Previous Psychiatric Diagnoses and Treatments  Cognitive Features That Contribute To Risk:  None    Suicide Risk:  Minimal: No identifiable suicidal ideation.  Patients presenting with no risk factors but with morbid ruminations; may be classified as minimal risk based on the severity of the depressive symptoms    Plan Of Care/Follow-up recommendations:  Activity:  As tolerated Diet:  Heart healthy Other:  Follow up with resources given  Disposition: Patient psychiatrically cleared.  Referral to Social work related to patient homelessness and unsure if his family is aware that he is homeless; Patient may need placement in a family care or assisted living facility.  Patient will also need assistance with medications and primary care if doesn't already have one.   No evidence of imminent risk to self or others at present.   Patient does not meet criteria for psychiatric inpatient admission. Supportive therapy provided about ongoing stressors. Discussed crisis plan, support from social network, calling 911, coming to the Emergency Department, and calling Suicide Hotline.  Shuvon Rankin, NP 04/15/2019, 12:33 PM   Patient seen by telemedicine for psychiatric evaluation, chart reviewed and case discussed with the physician extender and developed treatment plan. Reviewed the information documented and agree with the treatment plan.  Buford Dresser, DO 04/15/19 3:51 PM

## 2019-04-15 NOTE — Progress Notes (Addendum)
2nd shift ED CSW received a handoff from the 1st shift [facility] ED CSW.   Per 1st shift ED CSW, pt has been offered all resources available.  Per 1st shift ED CSW, CSW has nothing else to offer pt and pt is aware.  Provider and RN aware and RN states she is D/C'ing pt now.  RN states pt is ambulatory ans as such, CSW stated to RN pt can be made aware that the buses are currently free right now should pt choose to use them.  Please reconsult if future social work needs arise.  CSW signing off, as social work intervention is no longer needed.  Alphonse Guild. Barrington Worley, LCSW, LCAS, CSI Transitions of Care Clinical Social Worker Care Coordination Department Ph: [phone]

## 2019-04-15 NOTE — ED Triage Notes (Signed)
Patient was picked up from [place] by [facility]. EMS states a neighbor called on him. Patient reported to EMS he could not remember where he started but does remember being at the hospital in the last few days. Patient states he is experiencing bilateral feet pain.

## 2019-04-15 NOTE — ED Notes (Signed)
Pt on phone with Son.
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
## 2019-04-15 NOTE — ED Notes (Signed)
TTS consulted with pt

## 2019-04-16 ENCOUNTER — Emergency Department (HOSPITAL_COMMUNITY)
Admission: EM | Admit: 2019-04-16 | Discharge: 2019-04-16 | Disposition: A | Discharge status: Home / Self Care | Payer: Medicare Other | Source: Home / Self Care | Attending: Emergency Medicine | Admitting: Emergency Medicine

## 2019-04-16 ENCOUNTER — Emergency Department (HOSPITAL_COMMUNITY)
Admission: EM | Admit: 2019-04-16 | Discharge: 2019-04-16 | Disposition: A | Discharge status: Home / Self Care | Payer: Medicare Other | Attending: Emergency Medicine | Admitting: Emergency Medicine

## 2019-04-16 ENCOUNTER — Encounter (HOSPITAL_COMMUNITY): Payer: Self-pay | Admitting: Family Medicine

## 2019-04-16 ENCOUNTER — Encounter (HOSPITAL_COMMUNITY): Payer: Self-pay

## 2019-04-16 DIAGNOSIS — G8929 Other chronic pain: Secondary | ICD-10-CM

## 2019-04-16 DIAGNOSIS — M79672 Pain in left foot: Secondary | ICD-10-CM

## 2019-04-16 DIAGNOSIS — I10 Essential (primary) hypertension: Secondary | ICD-10-CM | POA: Insufficient documentation

## 2019-04-16 DIAGNOSIS — E119 Type 2 diabetes mellitus without complications: Secondary | ICD-10-CM | POA: Insufficient documentation

## 2019-04-16 DIAGNOSIS — F0150 Vascular dementia without behavioral disturbance: Secondary | ICD-10-CM | POA: Diagnosis not present

## 2019-04-16 DIAGNOSIS — M79671 Pain in right foot: Secondary | ICD-10-CM | POA: Insufficient documentation

## 2019-04-16 DIAGNOSIS — Z89411 Acquired absence of right great toe: Secondary | ICD-10-CM | POA: Insufficient documentation

## 2019-04-16 DIAGNOSIS — Z590 Homelessness: Secondary | ICD-10-CM | POA: Insufficient documentation

## 2019-04-16 MED ORDER — ACETAMINOPHEN 325 MG PO TABS
650.0000 mg | ORAL_TABLET | Freq: Once | ORAL | Status: DC
  Filled 2019-04-16: qty 2

## 2019-04-16 MED ORDER — GABAPENTIN 100 MG PO CAPS: 100.0000 mg | ORAL_CAPSULE | Freq: Three times a day (TID) | ORAL | 0 refills | Status: AC

## 2019-04-16 MED ORDER — GABAPENTIN 100 MG PO CAPS: 100.0000 mg | ORAL_CAPSULE | Freq: Three times a day (TID) | ORAL | 0 refills | Status: DC

## 2019-04-16 MED ORDER — ACETAMINOPHEN 500 MG PO TABS: 500.0000 mg | ORAL_TABLET | Freq: Four times a day (QID) | ORAL | 0 refills | Status: DC | PRN

## 2019-04-16 NOTE — ED Notes (Addendum)
Bryan Farrell said socail work consult is finished, patient is cleared to leave. Patient advised to go the the [facility], like Education officer, museum and papers recommended. Patient escorted to exit, explained that buses are free right now.

## 2019-04-16 NOTE — ED Triage Notes (Signed)
Patient was picked up at the intersection of [address] by [facility]. [facility] Suite Engineer, structural called for EMS since patient as lying on the side of the road. Patient complains of bilateral leg and feet pain.

## 2019-04-16 NOTE — Progress Notes (Signed)
Consult request has been received. CSW attempting to follow up at present time.  Pt is well-known to this ED and the [facility] ED for approx two years, first coming into contact with [facility] in [place].  Pt is also well-known to this Probation officer (2nd Shift ED CSW) as the CSW has spoke to pt, pt's family (son, daughter and ex-wife) extensively in the past.  On one of the two most recent visits (within previous 48 hours) the 1st shift ED CSW also spoke extensively with the pt and the pt's family who are no longer willing to help the pt due to their attempts in the past when pt had refused and the 1st shift ED CSW had exhausted all resources in attempting to assist the pt with shelter as pt does not qualify for any of the emergency shelter options currently available.  Per pt's son and daughter whom the CSW (this Probation officer) spoke to on past visits in 2018, the pt had been living at the [facility] in [place] and per the pt's son and daughter the pt has "burned through a $400,000 inheritance" and was then requesting from the pt's son and daughter that they allow him to come live with him.  In August,September and October of 2018 the pt had approx 27 ED visits and became well-known to the ED's and then the did not appear again until 04/01/19.  Per pt's son and daughter in 2018 they had wanted "nothing to do with him" as the pt was only then contacting them for help after spending his inheritance frivolously, as they saw it.  On pt's most recent visit he had stated to the CSW that he had done that and lived "this way" due to having "grown up with money".  Pt's wife in 2018 stated she was willing to assist the pt in applying for "early" social security but the pt at the time told the CSW (this Probation officer) that he was refusing because at 61, "I can get twice as much" and would prefer to remain as he was in regards to his homelessness.  At the time the CSW (this Probation officer had encouraged the pt to apply for disability  as pt stated he couldn't care for himself and pt had stated in 2018, "I don't think I am disabled in any way".  Pt has now returned to the ED and has visited [place] today and once on 9/1 and then on 8/31.  Today on 04/16/19 CSW spoke with Rachael at [facility] (the Health Dept gateway to all local shelters currently) who reports there are no shelters available at this time. Racael recommended CSW give patient their number [phone] so he can follow up with them on 04/17/19 about what shelters become available over the next couple of days.  Pt may go to the [facility] (see below) on 9/3 between 8pm-3pm to utilize their showers, laundry rooms, phones and case managers.  Pt may also request to meet with the N.[facility] Reginal Lutes who can assist the pt going forward with meds and check-ups. Pt can call the Health Dept at ph: [phone] to check for openings for single men shelters.  [facility] center ? [place], [place] [address] Phone: [phone] Fax: [phone]  CSW to speak to EDP now.  CSW will continue to follow for D/C needs.  Alphonse Guild. Vincent Streater, LCSW, LCAS, CSI Trans[facility] of [facility] Clinical Social Worker Care Coordination Department Ph: [phone]  York GriceJonathan Lakia Gritton, LCSW, LCAS Clinical Social Worker [phone]

## 2019-04-16 NOTE — ED Provider Notes (Signed)
[facility] DEPT Provider Note   CSN: [id number] Arrival date & time: 04/16/19  0449     History   Chief Complaint Chief Complaint  Patient presents with  . Leg Pain    HPI  Bryan Farrell is a 68 y.o. male.     The history is provided by the patient and medical records. No language interpreter was used.  Leg Pain Associated symptoms: no fever      68 year old male who is homeless presenting for evaluation of foot pain.  Patient was brought to the ED when he was found laying on the ground by [facility].  Patient denies any injury or any fall.  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  He described pain as a sharp throbbing sensation worse when he walks.  Does not complain of any associated fever chills no numbness or weakness.  Denies any specific treatment tried.  Patient is well-known to ED has been seen multiple times in the past.  He has an ED care plan.    Past Medical History:  Diagnosis Date  . Diabetes mellitus without complication ([place])   . Hypertension     Patient Active Problem List   Diagnosis Date Noted  . Moderate recurrent major depression ([place])   . Adjustment disorder with depressed mood 04/13/2017  . Vitamin B12 deficiency 03/25/2017  . Vascular dementia ([place]) 03/23/2017  . Suicidal ideation 03/23/2017  . Homelessness 03/23/2017  . Diabetes ([place]) 03/23/2017    Past Surgical History:  Procedure Laterality Date  . TOE AMPUTATION    . TONSILLECTOMY          Home Medications    Prior to Admission medications   Not on File    Family History Family History  Family history unknown: Yes    Social History Social History   Tobacco Use  . Smoking status: Never Smoker  . Smokeless tobacco: Never Used  Substance Use Topics  . Alcohol use: No    Comment: BAC clear  . Drug use: No    Comment: UDS clear     Allergies   Patient has no known  allergies.   Review of Systems Review of Systems  Constitutional: Negative for fever.  Musculoskeletal: Positive for arthralgias.  Skin: Negative for rash and wound.  Neurological: Negative for numbness.     Physical Exam Updated Vital Signs BP 138/84 (BP Location: Right Arm)   Pulse 73   Temp 98 F (36.7 C) (Oral)   Resp 18   SpO2 95%   Physical Exam Vitals signs and nursing note reviewed.  Constitutional:      General: He is not in acute distress.    Appearance: He is well-developed.  HENT:     Head: Atraumatic.  Eyes:     Conjunctiva/sclera: Conjunctivae normal.  Neck:     Musculoskeletal: Neck supple.  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  He is minimally tender to palpation diffusely without any rash or swelling.  Dorsalis pedis pulse palpable.  Ankles nontender.) present.  Skin:    Findings: No rash.  Neurological:     Mental Status: He is alert.      ED Treatments / Results  Labs (all labs ordered are listed, but only abnormal results are displayed) Labs Reviewed - No data to display  EKG  None  Radiology No results found.  Procedures Procedures (including critical care time)  Medications Ordered in ED Medications - No data to display   Initial Impression / Assessment and Plan / ED Course  I have reviewed the triage vital signs and the nursing notes.  Pertinent labs & imaging results that were available during my care of the patient were reviewed by me and considered in my medical decision making (see chart for details).        BP 138/84 (BP Location: Right Arm)   Pulse 73   Temp 98 F (36.7 C) (Oral)   Resp 18   SpO2 95%    Final Clinical Impressions(s) / ED Diagnoses   Final diagnoses:  Chronic pain of both feet    ED Discharge Orders    None     11:56 AM Patient is homeless, here with chronic bilateral foot pain.  No  evidence of infection noted. No recent injury.  He is neurovascular intact.  Will give Tylenol.  He is able to ambulate.  Stable for discharge.   Fayrene Helperran, Smaran Gaus, PA-C 04/16/19 1252    Milagros Lollykstra, Richard S, MD 04/17/19 1248

## 2019-04-16 NOTE — ED Notes (Signed)
No answer for room when called x 1.  

## 2019-04-16 NOTE — ED Notes (Signed)
Unable to locate pt to give him his medication ordered.

## 2019-04-16 NOTE — ED Provider Notes (Signed)
[facility]-EMERGENCY DEPT Provider Note   CSN: [id number] Arrival date & time: 04/16/19  1316     History   Chief Complaint Chief Complaint  Patient presents with  . Foot Pain    HPI Bryan Farrell is a 68 y.o. male.     HPI Patient presents with bilateral foot pain.  Seen earlier in the day for the same.  Has had 7 visits for the same in the last half month.  Both feet.  States it started after he had his right great toe cut off.  He cannot however tell me really when that was done.  Thinks was a couple months ago.  Pain is worse with walking.  Not relieved by anything else.  No trauma. Past Medical History:  Diagnosis Date  . Diabetes mellitus without complication ([facility])   . Hypertension     Patient Active Problem List   Diagnosis Date Noted  . Moderate recurrent major depression ([facility])   . Adjustment disorder with depressed mood 04/13/2017  . Vitamin B12 deficiency 03/25/2017  . Vascular dementia ([facility]) 03/23/2017  . Suicidal ideation 03/23/2017  . Homelessness 03/23/2017  . Diabetes ([facility]) 03/23/2017    Past Surgical History:  Procedure Laterality Date  . TOE AMPUTATION    . TONSILLECTOMY          Home Medications    Prior to Admission medications   Medication Sig Start Date End Date Taking? Authorizing Provider  acetaminophen (TYLENOL) 500 MG tablet Take 1 tablet (500 mg total) by mouth every 6 (six) hours as needed. 04/16/19   Fayrene Helperran, [place], PA-C  gabapentin (NEURONTIN) 100 MG capsule Take 1 capsule (100 mg total) by mouth 3 (three) times daily. 04/16/19   Benjiman CorePickering, Tailer Volkert, MD    Family History Family History  Family history unknown: Yes    Social History Social History   Tobacco Use  . Smoking status: Never Smoker  . Smokeless tobacco: Never Used  Substance Use Topics  . Alcohol use: No    Comment: BAC clear  . Drug use: No    Comment: UDS clear     Allergies   Patient has no known allergies.   Review of Systems  Review of Systems  Constitutional: Negative for appetite change.  Respiratory: Negative for shortness of breath.   Cardiovascular: Negative for chest pain.  Genitourinary: Negative for flank pain.  Musculoskeletal: Negative for back pain.       Bilateral foot pain  Neurological: Negative for weakness.  Psychiatric/Behavioral: Negative for confusion.     Physical Exam Updated Vital Signs BP (!) 153/87   Pulse 60   Temp 98.1 F (36.7 C) (Oral)   Resp 16   SpO2 100%   Physical Exam Vitals signs and nursing note reviewed.  HENT:     Head: Normocephalic.  Neck:     Musculoskeletal: Neck supple.  Cardiovascular:     Rate and Rhythm: Regular rhythm.  Pulmonary:     Effort: Pulmonary effort is normal.  Abdominal:     Tenderness: There is no abdominal tenderness.  Skin:    Comments: Patient post amputation of right great toe.  Sensation intact in both feet.  Pulses intact dorsally on both feet.  Good capillary refill.  Neurological:     Mental Status: He is alert and oriented to person, place, and time.      ED Treatments / Results  Labs (all labs ordered are listed, but only abnormal results are displayed) Labs Reviewed -  No data to display  EKG None  Radiology No results found.  Procedures Procedures (including critical care time)  Medications Ordered in ED Medications - No data to display   Initial Impression / Assessment and Plan / ED Course  I have reviewed the triage vital signs and the nursing notes.  Pertinent labs & imaging results that were available during my care of the patient were reviewed by me and considered in my medical decision making (see chart for details).        Patient with chronic foot pain.  With his diabetes potentially could be a neuropathy.  We will start a low-dose Neurontin and follow-up as an outpatient.  Final Clinical Impressions(s) / ED Diagnoses   Final diagnoses:  Bilateral foot pain    ED Discharge Orders          Ordered    gabapentin (NEURONTIN) 100 MG capsule  3 times daily,   Status:  Discontinued     04/16/19 1834    gabapentin (NEURONTIN) 100 MG capsule  3 times daily     04/16/19 1839           Davonna Belling, MD 04/16/19 1851

## 2019-04-16 NOTE — ED Notes (Signed)
Off duty officer states he visualized patient walking off the [employer]. Patient was not visualized in the lobby.

## 2019-04-16 NOTE — ED Notes (Signed)
Patient wants assistance finding a place to go . The patient stated he has no money to pay for medications. Patient stated he doesn't have a home to go to. This Probation officer contacted Chrissie Noa (patient's son). Chrissie Noa stated he's attempted to help patient several times and he's unable to take the patient in and not able to provide housing.   This Probation officer has put in a social work consult for further advise for patient. The patient will be discharged upon social work follow up.

## 2019-04-16 NOTE — ED Notes (Signed)
Talked with pt about wait to see doctor states he already saw the NP student, no respiratory or acute distress noted alert and oriented x 3 call light in reach.

## 2019-04-16 NOTE — Discharge Instructions (Addendum)
Your pain could be due to neuropathy.  Take the Neurontin to help.  Follow-up with your doctors.

## 2019-04-16 NOTE — ED Triage Notes (Signed)
Pt presents with c/o bilateral foot pain, just seen and discharged for same.

## 2019-04-18 ENCOUNTER — Encounter (HOSPITAL_COMMUNITY): Payer: Self-pay | Admitting: Emergency Medicine

## 2019-04-18 ENCOUNTER — Emergency Department (HOSPITAL_COMMUNITY)
Admission: EM | Admit: 2019-04-18 | Discharge: 2019-04-18 | Disposition: A | Discharge status: Home / Self Care | Payer: Medicare Other | Attending: Emergency Medicine | Admitting: Emergency Medicine

## 2019-04-18 ENCOUNTER — Other Ambulatory Visit: Payer: Self-pay

## 2019-04-18 DIAGNOSIS — E119 Type 2 diabetes mellitus without complications: Secondary | ICD-10-CM | POA: Diagnosis not present

## 2019-04-18 DIAGNOSIS — F332 Major depressive disorder, recurrent severe without psychotic features: Secondary | ICD-10-CM | POA: Insufficient documentation

## 2019-04-18 DIAGNOSIS — Z79899 Other long term (current) drug therapy: Secondary | ICD-10-CM | POA: Insufficient documentation

## 2019-04-18 DIAGNOSIS — R45851 Suicidal ideations: Secondary | ICD-10-CM | POA: Diagnosis not present

## 2019-04-18 DIAGNOSIS — F0390 Unspecified dementia without behavioral disturbance: Secondary | ICD-10-CM | POA: Diagnosis not present

## 2019-04-18 DIAGNOSIS — I10 Essential (primary) hypertension: Secondary | ICD-10-CM | POA: Insufficient documentation

## 2019-04-18 LAB — CBG MONITORING, ED
Glucose-Capillary: 306 mg/dL — ABNORMAL HIGH (ref 70–99)
Glucose-Capillary: 162 mg/dL — ABNORMAL HIGH (ref 70–99)

## 2019-04-18 LAB — CBC
HCT: 37.1 % — ABNORMAL LOW (ref 39.0–52.0)
Hemoglobin: 11.9 g/dL — ABNORMAL LOW (ref 13.0–17.0)
MCH: 29.8 pg (ref 26.0–34.0)
MCHC: 32.1 g/dL (ref 30.0–36.0)
MCV: 92.8 fL (ref 80.0–100.0)
Platelets: 345 10*3/uL (ref 150–400)
RBC: 4 MIL/uL — ABNORMAL LOW (ref 4.22–5.81)
RDW: 13.4 % (ref 11.5–15.5)
WBC: 6.5 10*3/uL (ref 4.0–10.5)
nRBC: 0 % (ref 0.0–0.2)

## 2019-04-18 LAB — COMPREHENSIVE METABOLIC PANEL
ALT: 16 U/L (ref 0–44)
AST: 16 U/L (ref 15–41)
Albumin: 3.8 g/dL (ref 3.5–5.0)
Alkaline Phosphatase: 78 U/L (ref 38–126)
Anion gap: 7 (ref 5–15)
BUN: 23 mg/dL (ref 8–23)
CO2: 25 mmol/L (ref 22–32)
Calcium: 9.2 mg/dL (ref 8.9–10.3)
Chloride: 104 mmol/L (ref 98–111)
Creatinine, Ser: 0.98 mg/dL (ref 0.61–1.24)
GFR calc Af Amer: >60 mL/min (ref >60)
GFR calc non Af Amer: >60 mL/min (ref >60)
Glucose, Bld: 288 mg/dL — ABNORMAL HIGH (ref 70–99)
Potassium: 4.1 mmol/L (ref 3.5–5.1)
Sodium: 136 mmol/L (ref 135–145)
Total Bilirubin: 0.9 mg/dL (ref 0.3–1.2)
Total Protein: 7.1 g/dL (ref 6.5–8.1)

## 2019-04-18 LAB — RAPID URINE DRUG SCREEN, HOSP PERFORMED
Amphetamines: NOT DETECTED
Barbiturates: NOT DETECTED
Benzodiazepines: NOT DETECTED
Cocaine: NOT DETECTED
Opiates: NOT DETECTED
Tetrahydrocannabinol: NOT DETECTED

## 2019-04-18 LAB — ACETAMINOPHEN LEVEL: Acetaminophen (Tylenol), Serum: <10 ug/mL — ABNORMAL LOW (ref 10–30)

## 2019-04-18 LAB — ETHANOL: Alcohol, Ethyl (B): <10 mg/dL (ref <10)

## 2019-04-18 LAB — SALICYLATE LEVEL: Salicylate Lvl: <7 mg/dL (ref 2.8–30.0)

## 2019-04-18 NOTE — ED Notes (Signed)
Pt states that his family doesn't wont anything from him and he is homeless and living in a shelter. Pt reports that having thoughts of SI with plan to run into traffic.

## 2019-04-18 NOTE — ED Triage Notes (Signed)
Pt brought in by [employer] for SI with plan to jump off bridge.

## 2019-04-18 NOTE — ED Notes (Addendum)
[facility] BACK PACK AND ONE HOSPITAL BAG OF CLOTHES PUT IN LOCKER 30.

## 2019-04-18 NOTE — ED Notes (Signed)
Patient wanded by security. One labeled patient belonging bag and one labeled backpack transferred with patient.

## 2019-04-18 NOTE — ED Provider Notes (Addendum)
[facility]-EMERGENCY DEPT Provider Note   CSN: [id number] Arrival date & time: 04/18/19  1432     History   Chief Complaint Chief Complaint  Patient presents with  . Suicidal    HPI Bryan Farrell is a 68 y.o. male.     The history is provided by the patient.  Mental Health Problem Presenting symptoms: depression, suicidal thoughts and suicidal threats   Patient accompanied by:  Law enforcement Degree of incapacity (severity):  Mild Onset quality:  Gradual Timing:  Intermittent Progression:  Waxing and waning Chronicity:  Recurrent Context: noncompliance   Relieved by:  Nothing Worsened by:  Nothing Associated symptoms: no abdominal pain and no chest pain   Risk factors: hx of mental illness     Past Medical History:  Diagnosis Date  . Diabetes mellitus without complication (HCC)   . Hypertension     Patient Active Problem List   Diagnosis Date Noted  . Moderate recurrent major depression (HCC)   . Adjustment disorder with depressed mood 04/13/2017  . Vitamin B12 deficiency 03/25/2017  . Vascular dementia (HCC) 03/23/2017  . Suicidal ideation 03/23/2017  . Homelessness 03/23/2017  . Diabetes (HCC) 03/23/2017    Past Surgical History:  Procedure Laterality Date  . TOE AMPUTATION    . TONSILLECTOMY          Home Medications    Prior to Admission medications   Medication Sig Start Date End Date Taking? Authorizing Provider  acetaminophen (TYLENOL) 500 MG tablet Take 1 tablet (500 mg total) by mouth every 6 (six) hours as needed. 04/16/19  Yes Fayrene Helperran, Bowie, PA-C  gabapentin (NEURONTIN) 100 MG capsule Take 1 capsule (100 mg total) by mouth 3 (three) times daily. 04/16/19  Yes Benjiman CorePickering, Nathan, MD    Family History Family History  Family history unknown: Yes    Social History Social History   Tobacco Use  . Smoking status: Never Smoker  . Smokeless tobacco: Never Used  Substance Use Topics  . Alcohol use: No    Comment:  BAC clear  . Drug use: No    Comment: UDS clear     Allergies   Patient has no known allergies.   Review of Systems Review of Systems  Constitutional: Negative for chills and fever.  HENT: Negative for ear pain and sore throat.   Eyes: Negative for pain and visual disturbance.  Respiratory: Negative for cough and shortness of breath.   Cardiovascular: Negative for chest pain and palpitations.  Gastrointestinal: Negative for abdominal pain and vomiting.  Genitourinary: Negative for dysuria and hematuria.  Musculoskeletal: Negative for arthralgias and back pain.  Skin: Negative for color change and rash.  Neurological: Negative for seizures and syncope.  Psychiatric/Behavioral: Positive for suicidal ideas.  All other systems reviewed and are negative.    Physical Exam Updated Vital Signs  ED Triage Vitals  Enc Vitals Group     BP 04/18/19 1439 102/67     Pulse Rate 04/18/19 1439 62     Resp 04/18/19 1439 16     Temp 04/18/19 1439 98.4 F (36.9 C)     Temp Source 04/18/19 1439 Oral     SpO2 04/18/19 1439 97 %     Weight --      Height --      Head Circumference --      Peak Flow --      Pain Score 04/18/19 1444 7     Pain Loc --  Pain Edu? --      Excl. in [place]? --     Physical Exam Vitals signs and nursing note reviewed.  Constitutional:      Appearance: He is well-developed.  HENT:     Head: Normocephalic and atraumatic.     Nose: Nose normal.     Mouth/Throat:     Mouth: Mucous membranes are moist.  Eyes:     Extraocular Movements: Extraocular movements intact.     Conjunctiva/sclera: Conjunctivae normal.     Pupils: Pupils are equal, round, and reactive to light.  Neck:     Musculoskeletal: Neck supple.  Cardiovascular:     Rate and Rhythm: Normal rate and regular rhythm.     Pulses: Normal pulses.     Heart sounds: Normal heart sounds. No murmur.  Pulmonary:     Effort: Pulmonary effort is normal. No respiratory distress.     Breath sounds:  Normal breath sounds.  Abdominal:     Palpations: Abdomen is soft.     Tenderness: There is no abdominal tenderness.  Skin:    General: Skin is warm and dry.     Capillary Refill: Capillary refill takes less than 2 seconds.  Neurological:     General: No focal deficit present.     Mental Status: He is alert and oriented to person, place, and time.  Psychiatric:        Mood and Affect: Mood is depressed.        Speech: Speech normal.        Behavior: Behavior normal.        Thought Content: Thought content includes suicidal ideation. Thought content does not include homicidal ideation. Thought content includes suicidal plan. Thought content does not include homicidal plan.        Cognition and Memory: Cognition normal.        Judgment: Judgment normal.      ED Treatments / Results  Labs (all labs ordered are listed, but only abnormal results are displayed) Labs Reviewed  COMPREHENSIVE METABOLIC PANEL - Abnormal; Notable for the following components:      Result Value   Glucose, Bld 288 (*)    All other components within normal limits  ACETAMINOPHEN LEVEL - Abnormal; Notable for the following components:   Acetaminophen (Tylenol), Serum <10 (*)    All other components within normal limits  CBC - Abnormal; Notable for the following components:   RBC 4.00 (*)    Hemoglobin 11.9 (*)    HCT 37.1 (*)    All other components within normal limits  CBG MONITORING, ED - Abnormal; Notable for the following components:   Glucose-Capillary 306 (*)    All other components within normal limits  ETHANOL  SALICYLATE LEVEL  RAPID URINE DRUG SCREEN, HOSP PERFORMED    EKG None  Radiology No results found.  Procedures Procedures (including critical care time)  Medications Ordered in ED Medications - No data to display   Initial Impression / Assessment and Plan / ED Course  I have reviewed the triage vital signs and the nursing notes.  Pertinent labs & imaging results that were  available during my care of the patient were reviewed by me and considered in my medical decision making (see chart for details).     Bryan Farrell is a 68 year old male with history of suicidal ideation, homelessness, diabetes who presents the ED with suicidal ideation with plan to jump off of a bridge, walk on to oncoming traffic.  Patient  brought in by police.  Patient with normal vitals.  No fever.  States stressors with family.  Does not take any medication.  Overall has no physical complaints.  Will get lab work to clear medically.  Will have mental health provider talk with him.  Unable to safely discharge him at this time from a mental health standpoint.  Has history of similar presentation however will allow medical clearance and behavioral health to talk with the patient and see if he meets any inpatient criteria.  Patient voluntarily here at this time.  This could be a part of his chronic suicidal ideation.  However, do not fully know this patient very well.  Lab work unremarkable.  Medically cleared at this time.  Awaiting psychiatric consultation.  Patient is here voluntarily.  If patient recants, I am okay with discharge.  Patient cleared by psychiatry.  Discharged in the ED in good condition.  This chart was dictated using voice recognition software.  Despite best efforts to proofread,  errors can occur which can change the documentation meaning.    Final Clinical Impressions(s) / ED Diagnoses   Final diagnoses:  Suicidal ideation    ED Discharge Orders    None       Virgina Norfolk, DO 04/18/19 1900    Virgina Norfolk, DO 04/18/19 2138

## 2019-04-18 NOTE — BHH Counselor (Signed)
Disposition:   Jinny Blossom, NP, patient is psych-cleared

## 2019-04-18 NOTE — BH Assessment (Signed)
Tele Assessment Note   Patient Name: Bryan Farrell MRN: [id number] Referring Physician: Virgina Norfolkuratolo, Adam, DO Location of Patient: [facility]-Ed Location of Provider: Behavioral Health TTS Department  [name] Bryan Farrell is an 68 y.o. male who recently visited the ED, has an extensive number of visits to the ED for homelessness and SI in the past. Patient presents with same compliant of suicidal ideations triggered depressive feelings of abandment by his children. Patient is homeless and feels rejected by his children. Patient denied having a specific plan to TTS assessor but reported earlier he had a plan to walk in traffic. Patient recently discharged [facility]. Report suicidal ideations for the past few weeks. Patient denied homicidal ideations, denied auditory / visual hallucinations.   Patient was alert and answered questions that was asked. Patient able to report his location, reason for being int the ER and time of day. Patient affect pleasant. Patient judgement partial impaired due to negative intrusive thoughts of suicidal ideations.   Disposition: Elta GuadeloupeLaurie Parks, NP, patient is psych-cleared    Diagnosis: F33.2, Major depressive disorder, Recurrent episode, Severe  Past Medical History:  Past Medical History:  Diagnosis Date  . Diabetes mellitus without complication (HCC)   . Hypertension     Past Surgical History:  Procedure Laterality Date  . TOE AMPUTATION    . TONSILLECTOMY      Family History:  Family History  Family history unknown: Yes    Social History:  reports that he has never smoked. He has never used smokeless tobacco. He reports that he does not drink alcohol or use drugs.  Additional Social History:  Alcohol / Drug Use Pain Medications: see MAR Prescriptions: see MAR Over the Counter: see MAR History of alcohol / drug use?: No history of alcohol / drug abuse  CIWA: CIWA-Ar BP: 104/73 Pulse Rate: (!) 58 COWS:    Allergies: No Known  Allergies  Home Medications: (Not in a hospital admission)   OB/GYN Status:  No LMP for male patient.  General Assessment Data Location of Assessment: [facility] ED TTS Assessment: In system Is this a Tele or Face-to-Face Assessment?: Face-to-Face Is this an Initial Assessment or a Re-assessment for this encounter?: Initial Assessment Patient Accompanied by:: N/A Language Other than English: No Living Arrangements: Homeless/Shelter What gender do you identify as?: Male Marital status: Divorced [place] Pregnancy Status: No Living Arrangements: Alone Can pt return to current living arrangement?: Yes Admission Status: Voluntary Is patient capable of signing voluntary admission?: Yes Referral Source: Self/Family/Friend Insurance type: Medicare      Crisis Care Plan Living Arrangements: Alone Legal Guardian: Other:(self) Name of Psychiatrist: None Name of Therapist: None  Education Status Is patient currently in school?: No Is the patient employed, unemployed or receiving disability?: Unemployed  Risk to self with the past 6 months Suicidal Ideation: Yes-Currently Present(patient report suicidal ideations with no specific plan) Has patient been a risk to self within the past 6 months prior to admission? : Yes Suicidal Intent: No Has patient had any suicidal intent within the past 6 months prior to admission? : Yes Is patient at risk for suicide?: No Suicidal Plan?: No Has patient had any suicidal plan within the past 6 months prior to admission? : No Access to Means: No What has been your use of drugs/alcohol within the last 12 months?: patient denies  Previous Attempts/Gestures: Yes How many times?: 1 Other Self Harm Risks: Patient is homeless  Triggers for Past Attempts: Unknown Intentional Self Injurious Behavior:  None Family Suicide History: Unknown Recent stressful life event(s): Other (Comment)(Homeless) Persecutory voices/beliefs?: No Depression:  No Depression Symptoms: Feeling worthless/self pity Substance abuse history and/or treatment for substance abuse?: No Suicide prevention information given to non-admitted patients: Not applicable  Risk to Others within the past 6 months Homicidal Ideation: No Does patient have any lifetime risk of violence toward others beyond the six months prior to admission? : No Thoughts of Harm to Others: No Current Homicidal Intent: No Current Homicidal Plan: No Access to Homicidal Means: No Identified Victim: None Noted  History of harm to others?: No Assessment of Violence: None Noted Violent Behavior Description: None Noted Does patient have access to weapons?: No Criminal Charges Pending?: No Does patient have a court date: No Is patient on probation?: No  Psychosis Hallucinations: None noted Delusions: None noted  Mental Status Report Appearance/Hygiene: In scrubs Eye Contact: Good Motor Activity: Freedom of movement Speech: Slow Level of Consciousness: Quiet/awake Mood: Depressed Affect: Appropriate to circumstance Anxiety Level: None Thought Processes: Circumstantial Judgement: Partial Orientation: Person, Place, Time Obsessive Compulsive Thoughts/Behaviors: None  Cognitive Functioning Concentration: Normal Memory: Recent Intact, Remote Intact Is patient IDD: No Insight: Fair Impulse Control: Fair Appetite: Good Have you had any weight changes? : No Change Sleep: No Change Total Hours of Sleep: 7 Vegetative Symptoms: None  ADLScreening [facility] Assessment Services) Patient's cognitive ability adequate to safely complete daily activities?: Yes Patient able to express need for assistance with ADLs?: Yes Independently performs ADLs?: Yes (appropriate for developmental age)  Prior Inpatient Therapy Prior Inpatient Therapy: Yes Prior Therapy Dates: Multiple Prior Therapy Facilty/Provider(s): Multiple Reason for Treatment: MH issues  Prior Outpatient Therapy Prior  Outpatient Therapy: No Does patient have an ACCT team?: No Does patient have Intensive In-House Services?  : No Does patient have [facility] services? : No Does patient have P4CC services?: No  ADL Screening (condition at time of admission) Patient's cognitive ability adequate to safely complete daily activities?: Yes Is the patient deaf or have difficulty hearing?: No Does the patient have difficulty seeing, even when wearing glasses/contacts?: No Does the patient have difficulty concentrating, remembering, or making decisions?: No Patient able to express need for assistance with ADLs?: Yes Does the patient have difficulty dressing or bathing?: No Independently performs ADLs?: Yes (appropriate for developmental age) Does the patient have difficulty walking or climbing stairs?: No       Abuse/Neglect Assessment (Assessment to be complete while patient is alone) Abuse/Neglect Assessment Can Be Completed: Yes Physical Abuse: Denies Verbal Abuse: Denies Sexual Abuse: Denies Exploitation of patient/patient's resources: Denies Self-Neglect: Denies     Regulatory affairs officer (For Healthcare) Does Patient Have a Medical Advance Directive?: No Would patient like information on creating a medical advance directive?: No - Patient declined          Disposition:  Disposition Initial Assessment Completed for this Encounter: Beatriz Stallion, NP, patient is psych-cleared )  This service was provided via telemedicine using a 2-way, interactive audio and Radiographer, therapeutic.  Names of all persons participating in this telemedicine service and their role in this encounter. Name: Areeb Corron. Pong  Role: patient   Name: Glenard Haring.  Role: TTS assessor   Name: Margarita Grizzle, NP,  Role: NP  Name:  Role:     Despina Hidden 04/18/2019 8:14 PM

## 2019-04-19 ENCOUNTER — Emergency Department (HOSPITAL_COMMUNITY)
Admission: EM | Admit: 2019-04-19 | Discharge: 2019-04-19 | Disposition: A | Discharge status: Home / Self Care | Payer: Medicare Other | Attending: Emergency Medicine | Admitting: Emergency Medicine

## 2019-04-19 ENCOUNTER — Other Ambulatory Visit: Payer: Self-pay

## 2019-04-19 ENCOUNTER — Encounter (HOSPITAL_COMMUNITY): Payer: Self-pay | Admitting: *Deleted

## 2019-04-19 DIAGNOSIS — Z590 Homelessness: Secondary | ICD-10-CM | POA: Diagnosis not present

## 2019-04-19 DIAGNOSIS — L600 Ingrowing nail: Secondary | ICD-10-CM | POA: Insufficient documentation

## 2019-04-19 DIAGNOSIS — E119 Type 2 diabetes mellitus without complications: Secondary | ICD-10-CM | POA: Diagnosis not present

## 2019-04-19 DIAGNOSIS — M79672 Pain in left foot: Secondary | ICD-10-CM | POA: Insufficient documentation

## 2019-04-19 DIAGNOSIS — M79671 Pain in right foot: Secondary | ICD-10-CM | POA: Diagnosis present

## 2019-04-19 DIAGNOSIS — I10 Essential (primary) hypertension: Secondary | ICD-10-CM | POA: Insufficient documentation

## 2019-04-19 LAB — CBG MONITORING, ED: Glucose-Capillary: 155 mg/dL — ABNORMAL HIGH (ref 70–99)

## 2019-04-19 MED ORDER — CLINDAMYCIN HCL 300 MG PO CAPS
300.0000 mg | ORAL_CAPSULE | Freq: Once | ORAL | Status: AC
  Administered 2019-04-19: 300 mg via ORAL
  Filled 2019-04-19: qty 1

## 2019-04-19 MED ORDER — CLINDAMYCIN HCL 300 MG PO CAPS: 300.0000 mg | ORAL_CAPSULE | Freq: Three times a day (TID) | ORAL | 0 refills | Status: AC

## 2019-04-19 NOTE — Discharge Instructions (Signed)
Soak foot in warm epsom salt water. Take Clindamycin as prescribed and complete the full course. Follow up with podiatry.

## 2019-04-19 NOTE — ED Triage Notes (Signed)
Pt reports bilateral feet pain. Pt states he has been walking so much that his feet hurt. Pt states he does not have a place to sleep and rest his feet.

## 2019-04-19 NOTE — ED Provider Notes (Signed)
[facility]-EMERGENCY DEPT Provider Note   CSN: [id number] Arrival date & time: 04/19/19  1140     History   Chief Complaint Chief Complaint  Patient presents with  . Foot Pain    bilateral    HPI Bryan Farrell is a 68 y.o. male.     68yo male presents with complaint of bilateral foot pain, chronic in nature since removal or right great toe for infection several years ago. Patient denies injuries to his feet, itching. No other complaints or concerns.      Past Medical History:  Diagnosis Date  . Diabetes mellitus without complication (HCC)   . Hypertension     Patient Active Problem List   Diagnosis Date Noted  . Moderate recurrent major depression (HCC)   . Adjustment disorder with depressed mood 04/13/2017  . Vitamin B12 deficiency 03/25/2017  . Vascular dementia (HCC) 03/23/2017  . Suicidal ideation 03/23/2017  . Homelessness 03/23/2017  . Diabetes (HCC) 03/23/2017    Past Surgical History:  Procedure Laterality Date  . TOE AMPUTATION    . TONSILLECTOMY          Home Medications    Prior to Admission medications   Medication Sig Start Date End Date Taking? Authorizing Provider  acetaminophen (TYLENOL) 500 MG tablet Take 1 tablet (500 mg total) by mouth every 6 (six) hours as needed. 04/16/19   Fayrene Helperran, Bowie, PA-C  clindamycin (CLEOCIN) 300 MG capsule Take 1 capsule (300 mg total) by mouth 3 (three) times daily for 7 days. 04/19/19 04/26/19  Jeannie FendMurphy, Laura A, PA-C  gabapentin (NEURONTIN) 100 MG capsule Take 1 capsule (100 mg total) by mouth 3 (three) times daily. 04/16/19   Benjiman CorePickering, Nathan, MD    Family History Family History  Family history unknown: Yes    Social History Social History   Tobacco Use  . Smoking status: Never Smoker  . Smokeless tobacco: Never Used  Substance Use Topics  . Alcohol use: No    Comment: BAC clear  . Drug use: No    Comment: UDS clear     Allergies   Patient has no known allergies.    Review of Systems Review of Systems  Constitutional: Negative for fever.  Musculoskeletal: Positive for arthralgias and myalgias.  Skin: Negative for color change, rash and wound.  Allergic/Immunologic: Positive for immunocompromised state.  Neurological: Negative for weakness and numbness.  All other systems reviewed and are negative.    Physical Exam Updated Vital Signs BP 122/86 (BP Location: Left Arm)   Pulse 74   Temp 98 F (36.7 C) (Oral)   Resp 18   Physical Exam Vitals signs and nursing note reviewed.  Constitutional:      General: He is not in acute distress.    Appearance: He is well-developed. He is not diaphoretic.  HENT:     Head: Normocephalic and atraumatic.  Cardiovascular:     Pulses: Normal pulses.  Pulmonary:     Effort: Pulmonary effort is normal.  Musculoskeletal:       Feet:  Skin:    General: Skin is warm and dry.     Findings: No rash.  Neurological:     Mental Status: He is alert and oriented to person, place, and time.  Psychiatric:        Behavior: Behavior normal.      ED Treatments / Results  Labs (all labs ordered are listed, but only abnormal results are displayed) Labs Reviewed  CBG MONITORING, ED -  Abnormal; Notable for the following components:      Result Value   Glucose-Capillary 155 (*)    All other components within normal limits    EKG None  Radiology No results found.  Procedures Procedures (including critical care time)  Medications Ordered in ED Medications  clindamycin (CLEOCIN) capsule 300 mg (300 mg Oral Given 04/19/19 1222)     Initial Impression / Assessment and Plan / ED Course  I have reviewed the triage vital signs and the nursing notes.  Pertinent labs & imaging results that were available during my care of the patient were reviewed by me and considered in my medical decision making (see chart for details).  Clinical Course as of Apr 18 1240  Sat Apr 18, 3281  [id number] 68 year old male with  report of bilateral foot pain, chronic in nature, states he is homeless and has been walking so much that his feet hurt, requesting a place to sleep and rest his feet.  On exam, right foot unremarkable, well-healed surgical scar from irritation of right first toe.  Left foot with ingrown left great toenail.  Patient given first dose of clindamycin in the emergency room, prescription for same, recommend that he soak the foot when possible.  Given referral to podiatry.   [LM]    Clinical Course User Index [LM] Tacy Learn, PA-C      Final Clinical Impressions(s) / ED Diagnoses   Final diagnoses:  Ingrown toenail with infection    ED Discharge Orders         Ordered    clindamycin (CLEOCIN) 300 MG capsule  3 times daily     04/19/19 1210           Tacy Learn, PA-C 04/19/19 1241    Lucrezia Starch, MD 04/20/19 [phone]

## 2019-05-09 ENCOUNTER — Other Ambulatory Visit: Payer: Self-pay

## 2019-05-09 DIAGNOSIS — Z20822 Contact with and (suspected) exposure to covid-19: Secondary | ICD-10-CM

## 2019-05-10 LAB — NOVEL CORONAVIRUS, NAA: SARS-CoV-2, NAA: NOT DETECTED

## 2019-05-27 ENCOUNTER — Other Ambulatory Visit: Payer: Self-pay

## 2019-05-27 ENCOUNTER — Inpatient Hospital Stay (HOSPITAL_COMMUNITY)
Admission: EM | Admit: 2019-05-27 | Discharge: 2019-06-03 | DRG: 240 | Disposition: A | Discharge status: Skilled Nursing Facility | Payer: Medicare Other | Attending: Internal Medicine | Admitting: Internal Medicine

## 2019-05-27 ENCOUNTER — Emergency Department (HOSPITAL_COMMUNITY): Payer: Medicare Other

## 2019-05-27 DIAGNOSIS — F329 Major depressive disorder, single episode, unspecified: Secondary | ICD-10-CM | POA: Diagnosis present

## 2019-05-27 DIAGNOSIS — L089 Local infection of the skin and subcutaneous tissue, unspecified: Secondary | ICD-10-CM

## 2019-05-27 DIAGNOSIS — F0150 Vascular dementia without behavioral disturbance: Secondary | ICD-10-CM | POA: Diagnosis present

## 2019-05-27 DIAGNOSIS — E1152 Type 2 diabetes mellitus with diabetic peripheral angiopathy with gangrene: Principal | ICD-10-CM

## 2019-05-27 DIAGNOSIS — R197 Diarrhea, unspecified: Secondary | ICD-10-CM | POA: Diagnosis not present

## 2019-05-27 DIAGNOSIS — Z89422 Acquired absence of other left toe(s): Secondary | ICD-10-CM | POA: Diagnosis not present

## 2019-05-27 DIAGNOSIS — E11621 Type 2 diabetes mellitus with foot ulcer: Secondary | ICD-10-CM | POA: Diagnosis present

## 2019-05-27 DIAGNOSIS — E119 Type 2 diabetes mellitus without complications: Secondary | ICD-10-CM | POA: Diagnosis not present

## 2019-05-27 DIAGNOSIS — Z6826 Body mass index (BMI) 26.0-26.9, adult: Secondary | ICD-10-CM

## 2019-05-27 DIAGNOSIS — I96 Gangrene, not elsewhere classified: Secondary | ICD-10-CM | POA: Diagnosis not present

## 2019-05-27 DIAGNOSIS — E1169 Type 2 diabetes mellitus with other specified complication: Secondary | ICD-10-CM | POA: Diagnosis present

## 2019-05-27 DIAGNOSIS — Z20828 Contact with and (suspected) exposure to other viral communicable diseases: Secondary | ICD-10-CM | POA: Diagnosis present

## 2019-05-27 DIAGNOSIS — I10 Essential (primary) hypertension: Secondary | ICD-10-CM | POA: Diagnosis present

## 2019-05-27 DIAGNOSIS — M86072 Acute hematogenous osteomyelitis, left ankle and foot: Secondary | ICD-10-CM | POA: Diagnosis not present

## 2019-05-27 DIAGNOSIS — L97509 Non-pressure chronic ulcer of other part of unspecified foot with unspecified severity: Secondary | ICD-10-CM | POA: Diagnosis not present

## 2019-05-27 DIAGNOSIS — Z89411 Acquired absence of right great toe: Secondary | ICD-10-CM | POA: Diagnosis not present

## 2019-05-27 DIAGNOSIS — B952 Enterococcus as the cause of diseases classified elsewhere: Secondary | ICD-10-CM

## 2019-05-27 DIAGNOSIS — Z590 Homelessness: Secondary | ICD-10-CM | POA: Diagnosis not present

## 2019-05-27 DIAGNOSIS — M869 Osteomyelitis, unspecified: Secondary | ICD-10-CM | POA: Diagnosis present

## 2019-05-27 DIAGNOSIS — Z915 Personal history of self-harm: Secondary | ICD-10-CM | POA: Diagnosis not present

## 2019-05-27 DIAGNOSIS — R451 Restlessness and agitation: Secondary | ICD-10-CM | POA: Diagnosis not present

## 2019-05-27 DIAGNOSIS — M7989 Other specified soft tissue disorders: Secondary | ICD-10-CM | POA: Diagnosis not present

## 2019-05-27 DIAGNOSIS — L97529 Non-pressure chronic ulcer of other part of left foot with unspecified severity: Secondary | ICD-10-CM | POA: Diagnosis not present

## 2019-05-27 DIAGNOSIS — R7881 Bacteremia: Secondary | ICD-10-CM | POA: Diagnosis present

## 2019-05-27 DIAGNOSIS — I340 Nonrheumatic mitral (valve) insufficiency: Secondary | ICD-10-CM | POA: Diagnosis not present

## 2019-05-27 DIAGNOSIS — M86272 Subacute osteomyelitis, left ankle and foot: Secondary | ICD-10-CM | POA: Diagnosis not present

## 2019-05-27 DIAGNOSIS — F339 Major depressive disorder, recurrent, unspecified: Secondary | ICD-10-CM | POA: Diagnosis not present

## 2019-05-27 DIAGNOSIS — E441 Mild protein-calorie malnutrition: Secondary | ICD-10-CM | POA: Diagnosis present

## 2019-05-27 DIAGNOSIS — T148XXA Other injury of unspecified body region, initial encounter: Secondary | ICD-10-CM | POA: Diagnosis present

## 2019-05-27 DIAGNOSIS — I361 Nonrheumatic tricuspid (valve) insufficiency: Secondary | ICD-10-CM | POA: Diagnosis not present

## 2019-05-27 DIAGNOSIS — Z89421 Acquired absence of other right toe(s): Secondary | ICD-10-CM | POA: Diagnosis not present

## 2019-05-27 LAB — COMPREHENSIVE METABOLIC PANEL
ALT: 18 U/L (ref 0–44)
AST: 20 U/L (ref 15–41)
Albumin: 3.1 g/dL — ABNORMAL LOW (ref 3.5–5.0)
Alkaline Phosphatase: 79 U/L (ref 38–126)
Anion gap: 12 (ref 5–15)
BUN: 10 mg/dL (ref 8–23)
CO2: 26 mmol/L (ref 22–32)
Calcium: 9.2 mg/dL (ref 8.9–10.3)
Chloride: 100 mmol/L (ref 98–111)
Creatinine, Ser: 1.05 mg/dL (ref 0.61–1.24)
GFR calc Af Amer: >60 mL/min (ref >60)
GFR calc non Af Amer: >60 mL/min (ref >60)
Glucose, Bld: 285 mg/dL — ABNORMAL HIGH (ref 70–99)
Potassium: 3.8 mmol/L (ref 3.5–5.1)
Sodium: 138 mmol/L (ref 135–145)
Total Bilirubin: 0.3 mg/dL (ref 0.3–1.2)
Total Protein: 6.9 g/dL (ref 6.5–8.1)

## 2019-05-27 LAB — CBC WITH DIFFERENTIAL/PLATELET
Abs Immature Granulocytes: 0.04 10*3/uL (ref 0.00–0.07)
Basophils Absolute: 0.1 10*3/uL (ref 0.0–0.1)
Basophils Relative: 1 %
Eosinophils Absolute: 0.1 10*3/uL (ref 0.0–0.5)
Eosinophils Relative: 1 %
HCT: 35.6 % — ABNORMAL LOW (ref 39.0–52.0)
Hemoglobin: 11.7 g/dL — ABNORMAL LOW (ref 13.0–17.0)
Immature Granulocytes: 0 %
Lymphocytes Relative: 14 %
Lymphs Abs: 1.3 10*3/uL (ref 0.7–4.0)
MCH: 29.8 pg (ref 26.0–34.0)
MCHC: 32.9 g/dL (ref 30.0–36.0)
MCV: 90.6 fL (ref 80.0–100.0)
Monocytes Absolute: 0.7 10*3/uL (ref 0.1–1.0)
Monocytes Relative: 7 %
Neutro Abs: 7 10*3/uL (ref 1.7–7.7)
Neutrophils Relative %: 77 %
Platelets: 561 10*3/uL — ABNORMAL HIGH (ref 150–400)
RBC: 3.93 MIL/uL — ABNORMAL LOW (ref 4.22–5.81)
RDW: 12.8 % (ref 11.5–15.5)
WBC: 9.2 10*3/uL (ref 4.0–10.5)
nRBC: 0 % (ref 0.0–0.2)

## 2019-05-27 LAB — LACTIC ACID, PLASMA
Lactic Acid, Venous: 1.9 mmol/L (ref 0.5–1.9)
Lactic Acid, Venous: 1.9 mmol/L (ref 0.5–1.9)

## 2019-05-27 MED ORDER — VANCOMYCIN HCL 10 G IV SOLR
1500.0000 mg | Freq: Once | INTRAVENOUS | Status: AC
  Administered 2019-05-27: 1500 mg via INTRAVENOUS
  Filled 2019-05-27: qty 1500

## 2019-05-27 MED ORDER — ACETAMINOPHEN 325 MG PO TABS: 650.0000 mg | ORAL_TABLET | Freq: Four times a day (QID) | ORAL | Status: DC | PRN

## 2019-05-27 MED ORDER — SODIUM CHLORIDE 0.9 % IV BOLUS
1000.0000 mL | Freq: Once | INTRAVENOUS | Status: AC
  Administered 2019-05-27: 23:00:00 1000 mL via INTRAVENOUS

## 2019-05-27 MED ORDER — SODIUM CHLORIDE 0.9% FLUSH: 3.0000 mL | Freq: Once | INTRAVENOUS | Status: DC

## 2019-05-27 MED ORDER — ACETAMINOPHEN 650 MG RE SUPP: 650.0000 mg | Freq: Four times a day (QID) | RECTAL | Status: DC | PRN

## 2019-05-27 MED ORDER — SODIUM CHLORIDE 0.9 % IV BOLUS
1000.0000 mL | Freq: Once | INTRAVENOUS | Status: AC
  Administered 2019-05-27: 22:00:00 1000 mL via INTRAVENOUS

## 2019-05-27 MED ORDER — SODIUM CHLORIDE 0.9 % IV SOLN
2.0000 g | Freq: Once | INTRAVENOUS | Status: AC
  Administered 2019-05-27: 22:00:00 2 g via INTRAVENOUS
  Filled 2019-05-27: qty 2

## 2019-05-27 MED ORDER — ENOXAPARIN SODIUM 40 MG/0.4ML ~~LOC~~ SOLN
40.0000 mg | Freq: Every day | SUBCUTANEOUS | Status: DC
  Administered 2019-05-28 – 2019-05-29 (×2): 40 mg via SUBCUTANEOUS
  Filled 2019-05-27 (×2): qty 0.4

## 2019-05-27 NOTE — H&P (Addendum)
Date: 05/28/2019               Patient Name:  Bryan Farrell MRN: [id number]  DOB: Sep 07, 1950 Age / Sex: 68 y.o., male   PCP: Hilbert CorriganPlacey, Chales AbrahamsMary Ann, NP         Medical Service: Internal Medicine Teaching Service         Attending Physician: Dr. Reymundo PollGuilloud, Carolyn, MD    First Contact: Ephriam Knuckleshristian, MD, Rylee Pager: [phone])  Second Contact: Cleaster CorinSeawell, DO, Jaimie PagerDuane Lope: Jsea [phone])       After Hours (After 5p/  First Contact Pager: [phone]  weekends / holidays): Second Contact Pager: [phone]   Chief Complaint: black toe   History of Present Illness: 68 y.o. yo male w/ PMH significant for diabetes, HTN, vascular dementia, and major depressive disorder with history of suicidal ideation (one suicidal attempt by hanging in 2011) presenting with a necrotic left second toe. Patient is a poor historian and does not remember when the discoloration started. He states that [facility] after episode of urinary incontinence when staff noticed his left toe and was sent to the ED via EMS. Patient has a previous amputation of the right great toe that may be 5 months vs 68 years old per chart review as patient does not remember when he had the amputation. Patient is currently homeless and reports that he has been ambulating with a cane without any significant pain in his left second toe. He does endorse some loose stools recently with up to 3 bowel movements per day that he describes as "almost liquid". He denies any change in the color of the stools or any blood in his stools. No associated abdominal pain, nausea or vomiting. He denies any recent travel or change in diet.  He denies any fevers, chills, cough, chest pain, shortness of breath, or any urinary symptoms.   Of note, patient has had multiple admissions to the ED due to his homeless status and prior suicidal ideations. On recent visit in September, patient was seen for chronic bilateral foot pain and infected ingrown  left great toenail. No abnormalities of left second toe noted at the time.   ED course:  Patient presented to the ED via EMS for left second toe necrosis. Patient with transient hypotension for which code sepsis initiated.  Imaging of left foot concerning of osteomyelitis involving proximal phalanx and second digit along distal medial cortex. Patient given one dose of cefepime and vancomycin in the ED. Orthopedics consulted by ED for possible amputation and patient admitted to inpatient for further evaluation and management.   Meds:  No outpatient medications have been marked as taking for the 05/27/19 encounter [facility](Hospital Encounter).     Allergies: Allergies as of 05/27/2019  . (No Known Allergies)   Past Medical History:  Diagnosis Date  . Diabetes mellitus without complication (HCC)   . Hypertension     Family History:  Family History  Problem Relation Age of Onset  . Heart Problems Mother   . Heart Problems Father      Social History:  Social History   Tobacco Use  . Smoking status: Never Smoker  . Smokeless tobacco: Never Used  Substance Use Topics  . Alcohol use: Not Currently  . Drug use: No     Review of Systems: A complete ROS was negative except as per HPI.   Physical Exam: Blood pressure 116/85, pulse 82, temperature 98.7 F (37.1 C), temperature source Oral,  resp. rate 16, SpO2 100 %. Physical Exam Vitals signs reviewed.  Constitutional:      General: He is not in acute distress.    Appearance: He is not diaphoretic.  HENT:     Head: Normocephalic and atraumatic.     Mouth/Throat:     Mouth: Mucous membranes are moist.     Pharynx: Oropharynx is clear. No oropharyngeal exudate or posterior oropharyngeal erythema.  Eyes:     General: No scleral icterus.    Extraocular Movements: Extraocular movements intact.  Cardiovascular:     Rate and Rhythm: Normal rate and regular rhythm.     Pulses: Normal pulses.     Heart sounds: Normal heart sounds.   Pulmonary:     Effort: Pulmonary effort is normal. No respiratory distress.     Breath sounds: Normal breath sounds. No stridor. No wheezing, rhonchi or rales.  Abdominal:     General: Abdomen is flat. There is no distension.     Palpations: Abdomen is soft.     Tenderness: There is no abdominal tenderness. There is no guarding or rebound.  Musculoskeletal: Normal range of motion.        General: Swelling and tenderness present. No deformity or signs of injury.     Left lower leg: Edema present.  Skin:    General: Skin is warm and dry.     Capillary Refill: Capillary refill takes less than 2 seconds.     Findings: Lesion present.     Comments: 2nd left toe: See picture below   Neurological:     General: No focal deficit present.     Mental Status: He is alert and oriented to person, place, and time. Mental status is at baseline.     Motor: No weakness.     Coordination: Coordination normal.  Psychiatric:        Mood and Affect: Mood normal.        Behavior: Behavior normal.        EKG: personally reviewed my interpretation is NSR with motion artifact throughout    COMPLETE X-RAY LEFT FOOT:  IMPRESSION: Potential osteomyelitis involving proximal phalanx second digit along the distal medial cortex.  Assessment & Plan by Problem: Patient is a 68yo un-homed male w/ Hx of diabetes, HTN, vascular dementia, MDD with prior suicidal ideation and previous amputation of right great toe presenting with necrosis of the left second toe concerning for osteomyelitis.   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 He denies any pain or feeling in the toe but does report pain at the bottom of his feet bilaterally. He uses a cane to ambulate at baseline since amputation of his right great toe (unclear timeline of amputation 5 months vs 5 years ago). He has not noticed any increase in pain or difficulty with ambulation. He  denies any drainage from the infection. He has not had any fevers or chills. On exam, patient afebrile and hemodynamically stable. Significant necrosis noted of second left toe to the base without any drainage noted. No bone visualized on probing. X-ray of left foot with potential osteomyelitis of proximal phalanx of second digit. No significant leukocytosis or lactic acid elevation noted on labs. Patient given one dose of cefepime and vancomycin in the ED and orthopedics consulted.  - Continue broad spectrum antibiotics - f/u orthopedics recommendations - PT/OT eval  LLE swelling:  Patient with left lower extremity swelling. Patient denies  any pain or trauma to the area. Examination significant for pitting edema of extremity and increased warmth and tenderness to palpation. Concerning for DVT. Well's Criteria 2 in setting of recent infection.  - DVT US LLE  Hx of diabetes mellitus:  Pt with history of diabetes (HbA1c 9.8 in 2019) and previously on metformin; however has not been taking any medications. Possible that he has peripheral neuropathy as complication given chronic bilateral foot pain that he has had multiple ED admissions for.  -Repeat HbA1c -CBG monitoring - SSI   Hx of major depressive disorder:  Patient with Hx of major depressive disorder with multiple ED admissions for suicidal ideation and a prior suicide attempt by hanging in 2011. He continues to express feeling sad and disinterest in things and feels like he has been abandoned by his family. Patient is not currently on any psychiatric medications. He denies any current suicidal or homicidal ideation. He is agreeable to ask for help if develops suicidal ideation.  - Continue to monitor  Dispo planning: Patient is currently unhomed and has had multiple ED admissions due to his homeless status. Patient reports that he was previously living in homeless shelter but no longer resides there. He stays at [facility] but reports that he is  still sleeping on the streets at night. He is unsure if he will be able to return to [facility] on discharge. - Consult care management for housing situation on discharge and PCP establishment   Diet: NPO  Code: Full  VTE Prophylaxis: Lovenox  Dispo: Admit patient to Inpatient with expected length of stay greater than 2 midnights.  SignedEliezer Bottom, MD 05/28/2019, 12:28 AM  Pager: [phone]

## 2019-05-27 NOTE — ED Notes (Signed)
Bladder Scan: 34ml urine .

## 2019-05-27 NOTE — ED Provider Notes (Signed)
[facility] Emergency Department Provider Note MRN:  [id number]  Arrival date & time: 05/27/19     Chief Complaint   Wound Infection   History of Present Illness   Bryan Farrell is a 68 y.o. year-old male with a history of diabetes presenting to the ED with chief complaint of wound infection.  Patient's feet were evaluated or seen by police today, and then noted that 1 of them was black.  Brought here for evaluation.  Patient is not sure of the last time he saw his feet, thinks that the discoloration was not present 2 days ago.  Denies fever, no cough, no chest pain or shortness of breath, no abdominal pain.  Endorsing mild to moderate pain to the affected toe, constant, worse with motion or palpation.  Review of Systems  A complete 10 system review of systems was obtained and all systems are negative except as noted in the HPI and PMH.   Patient's Health History    Past Medical History:  Diagnosis Date  . Diabetes mellitus without complication (HCC)   . Hypertension     Past Surgical History:  Procedure Laterality Date  . TOE AMPUTATION    . TONSILLECTOMY      Family History  Family history unknown: Yes    Social History   Socioeconomic History  . Marital status: Single    Spouse name: Not on file  . Number of children: Not on file  . Years of education: Not on file  . Highest education level: Not on file  Occupational History  . Not on file  Social Needs  . Financial resource strain: Not on file  . Food insecurity    Worry: Not on file    Inability: Not on file  . Transportation needs    Medical: Not on file    Non-medical: Not on file  Tobacco Use  . Smoking status: Never Smoker  . Smokeless tobacco: Never Used  Substance and Sexual Activity  . Alcohol use: No    Comment: BAC clear  . Drug use: No    Comment: UDS clear  . Sexual activity: Never  Lifestyle  . Physical activity    Days per week: Not on file    Minutes per  session: Not on file  . Stress: Not on file  Relationships  . Social Musicianconnections    Talks on phone: Not on file    Gets together: Not on file    Attends religious service: Not on file    Active member of club or organization: Not on file    Attends meetings of clubs or organizations: Not on file    Relationship status: Not on file  . Intimate partner violence    Fear of current or ex partner: Not on file    Emotionally abused: Not on file    Physically abused: Not on file    Forced sexual activity: Not on file  Other Topics Concern  . Not on file  Social History Narrative  . Not on file     Physical Exam  Vital Signs and Nursing Notes reviewed Vitals:   05/27/19 2115 05/27/19 2200  BP: 114/70 116/85  Pulse: 81 82  Resp:    Temp:    SpO2: 98% 100%    CONSTITUTIONAL: Chronically ill-appearing, NAD NEURO:  Alert and oriented x 3, no focal deficits EYES:  eyes equal and reactive ENT/NECK:  no LAD, no JVD CARDIO: Regular rate, well-perfused, normal S1 and  S2 PULM:  CTAB no wheezing or rhonchi GI/GU:  normal bowel sounds, non-distended, non-tender MSK/SPINE:  No gross deformities, no edema SKIN: Blackened and necrotic appearance to the dorsal aspect of the left second toe, increased warmth and mild edema to the left foot and the left lower leg; foul odor to this extremity PSYCH:  Appropriate speech and behavior  Diagnostic and Interventional Summary    EKG Interpretation  Date/Time:  Tuesday May 27 2019 22:13:51 EDT Ventricular Rate:  81 PR Interval:    QRS Duration: 83 QT Interval:  425 QTC Calculation: 494 R Axis:   30 Text Interpretation:  Sinus Rhythm RSR' in V1 or V2, right VCD or RVH Borderline prolonged QT interval Artifact in lead(s) I II III aVR aVL aVF V1 V2 V3 V4 Confirmed by Gerlene Fee [phone]) on 05/27/2019 10:37:56 PM      Labs Reviewed  COMPREHENSIVE METABOLIC PANEL - Abnormal; Notable for the following components:      Result Value    Glucose, Bld 285 (*)    Albumin 3.1 (*)    All other components within normal limits  CBC WITH DIFFERENTIAL/PLATELET - Abnormal; Notable for the following components:   RBC 3.93 (*)    Hemoglobin 11.7 (*)    HCT 35.6 (*)    Platelets 561 (*)    All other components within normal limits  CULTURE, BLOOD (ROUTINE X 2)  CULTURE, BLOOD (ROUTINE X 2)  SARS CORONAVIRUS 2 (TAT 6-24 HRS)  LACTIC ACID, PLASMA  LACTIC ACID, PLASMA  URINALYSIS, ROUTINE W REFLEX MICROSCOPIC    DG Foot Complete Left  Final Result      Medications  sodium chloride flush (NS) 0.9 % injection 3 mL (has no administration in time range)  sodium chloride 0.9 % bolus 1,000 mL (has no administration in time range)  vancomycin (VANCOCIN) 1,500 mg in sodium chloride 0.9 % 500 mL IVPB (1,500 mg Intravenous New Bag/Given 05/27/19 2230)  sodium chloride 0.9 % bolus 1,000 mL (1,000 mLs Intravenous New Bag/Given 05/27/19 2153)  ceFEPIme (MAXIPIME) 2 g in sodium chloride 0.9 % 100 mL IVPB (0 g Intravenous Stopped 05/27/19 2229)     Procedures Critical Care Critical Care Documentation Critical care time provided by me (excluding procedures): 33 minutes  Condition necessitating critical care: Osteomyelitis  Components of critical care management: reviewing of prior records, laboratory and imaging interpretation, frequent re-examination and reassessment of vital signs, administration of IV fluids, IV antibiotics, discussion with consulting services.    ED Course and Medical Decision Making  I have reviewed the triage vital signs and the nursing notes.  Pertinent labs & imaging results that were available during my care of the patient were reviewed by me and considered in my medical decision making (see below for details).  Clear source of infection, hypotension, code sepsis initiated.  This hypotensive episode seems to have been transient but will monitor closely.  Will discuss need for amputation with orthopedics and  consult hospitalist service for admission.  Barth Kirks. Sedonia Small, [place] [email or web address]  Final Clinical Impressions(s) / ED Diagnoses     ICD-10-CM   1. Osteomyelitis of other site, unspecified type ([place])  M86.9   2. Wound infection  T14.8XXA DG Foot Complete Left   L08.9 DG Foot Complete Left    ED Discharge Orders    None      Discharge Instructions Discussed with and Provided to Patient: Discharge Instructions   None  Sabas Sous, MD 05/27/19 2238

## 2019-05-27 NOTE — ED Triage Notes (Signed)
Pt arrives via EMS from [facility] where patient was getting a shower for incontinence. Staff noted patients 2nd toe on his right foot with black wound. Pt doesn't remember when wound became black. Denies pain. VSS. cbg 336.

## 2019-05-28 ENCOUNTER — Other Ambulatory Visit: Payer: Self-pay

## 2019-05-28 ENCOUNTER — Encounter (HOSPITAL_COMMUNITY): Payer: Self-pay | Admitting: Internal Medicine

## 2019-05-28 ENCOUNTER — Inpatient Hospital Stay (HOSPITAL_COMMUNITY): Payer: Medicare Other

## 2019-05-28 ENCOUNTER — Other Ambulatory Visit: Payer: Self-pay | Admitting: Family

## 2019-05-28 ENCOUNTER — Inpatient Hospital Stay (HOSPITAL_COMMUNITY): Payer: Medicaid Other

## 2019-05-28 DIAGNOSIS — M86272 Subacute osteomyelitis, left ankle and foot: Secondary | ICD-10-CM

## 2019-05-28 DIAGNOSIS — I96 Gangrene, not elsewhere classified: Secondary | ICD-10-CM

## 2019-05-28 DIAGNOSIS — M86072 Acute hematogenous osteomyelitis, left ankle and foot: Secondary | ICD-10-CM

## 2019-05-28 DIAGNOSIS — E11621 Type 2 diabetes mellitus with foot ulcer: Secondary | ICD-10-CM

## 2019-05-28 DIAGNOSIS — R197 Diarrhea, unspecified: Secondary | ICD-10-CM

## 2019-05-28 DIAGNOSIS — E441 Mild protein-calorie malnutrition: Secondary | ICD-10-CM

## 2019-05-28 DIAGNOSIS — E1152 Type 2 diabetes mellitus with diabetic peripheral angiopathy with gangrene: Secondary | ICD-10-CM

## 2019-05-28 DIAGNOSIS — L97509 Non-pressure chronic ulcer of other part of unspecified foot with unspecified severity: Secondary | ICD-10-CM

## 2019-05-28 DIAGNOSIS — Z915 Personal history of self-harm: Secondary | ICD-10-CM

## 2019-05-28 DIAGNOSIS — E119 Type 2 diabetes mellitus without complications: Secondary | ICD-10-CM

## 2019-05-28 DIAGNOSIS — M7989 Other specified soft tissue disorders: Secondary | ICD-10-CM

## 2019-05-28 HISTORY — DX: Gangrene, not elsewhere classified: I96

## 2019-05-28 LAB — CBC
HCT: 31 % — ABNORMAL LOW (ref 39.0–52.0)
Hemoglobin: 10.2 g/dL — ABNORMAL LOW (ref 13.0–17.0)
MCH: 29.7 pg (ref 26.0–34.0)
MCHC: 32.9 g/dL (ref 30.0–36.0)
MCV: 90.4 fL (ref 80.0–100.0)
Platelets: 487 10*3/uL — ABNORMAL HIGH (ref 150–400)
RBC: 3.43 MIL/uL — ABNORMAL LOW (ref 4.22–5.81)
RDW: 12.9 % (ref 11.5–15.5)
WBC: 8.3 10*3/uL (ref 4.0–10.5)
nRBC: 0 % (ref 0.0–0.2)

## 2019-05-28 LAB — BLOOD CULTURE ID PANEL (REFLEXED)
Acinetobacter baumannii: NOT DETECTED
Candida albicans: NOT DETECTED
Candida glabrata: NOT DETECTED
Candida krusei: NOT DETECTED
Candida parapsilosis: NOT DETECTED
Candida tropicalis: NOT DETECTED
Enterobacter cloacae complex: NOT DETECTED
Enterobacteriaceae species: NOT DETECTED
Enterococcus species: DETECTED — AB
Escherichia coli: NOT DETECTED
Haemophilus influenzae: NOT DETECTED
Klebsiella oxytoca: NOT DETECTED
Klebsiella pneumoniae: NOT DETECTED
Listeria monocytogenes: NOT DETECTED
Methicillin resistance: DETECTED — AB
Neisseria meningitidis: NOT DETECTED
Proteus species: NOT DETECTED
Pseudomonas aeruginosa: NOT DETECTED
Serratia marcescens: NOT DETECTED
Staphylococcus aureus (BCID): NOT DETECTED
Staphylococcus species: DETECTED — AB
Streptococcus agalactiae: NOT DETECTED
Streptococcus pneumoniae: NOT DETECTED
Streptococcus pyogenes: NOT DETECTED
Streptococcus species: NOT DETECTED
Vancomycin resistance: NOT DETECTED

## 2019-05-28 LAB — URINALYSIS, ROUTINE W REFLEX MICROSCOPIC
Bacteria, UA: NONE SEEN
Bilirubin Urine: NEGATIVE
Glucose, UA: >=500 mg/dL — AB
Hgb urine dipstick: NEGATIVE
Ketones, ur: NEGATIVE mg/dL
Leukocytes,Ua: NEGATIVE
Nitrite: NEGATIVE
Protein, ur: NEGATIVE mg/dL
Specific Gravity, Urine: 1.026 (ref 1.005–1.030)
pH: 5 (ref 5.0–8.0)

## 2019-05-28 LAB — HIV ANTIBODY (ROUTINE TESTING W REFLEX): HIV Screen 4th Generation wRfx: NONREACTIVE

## 2019-05-28 LAB — BASIC METABOLIC PANEL
Anion gap: 10 (ref 5–15)
BUN: 12 mg/dL (ref 8–23)
CO2: 23 mmol/L (ref 22–32)
Calcium: 8.4 mg/dL — ABNORMAL LOW (ref 8.9–10.3)
Chloride: 108 mmol/L (ref 98–111)
Creatinine, Ser: 0.46 mg/dL — ABNORMAL LOW (ref 0.61–1.24)
GFR calc Af Amer: >60 mL/min (ref >60)
GFR calc non Af Amer: >60 mL/min (ref >60)
Glucose, Bld: 226 mg/dL — ABNORMAL HIGH (ref 70–99)
Potassium: 3.4 mmol/L — ABNORMAL LOW (ref 3.5–5.1)
Sodium: 141 mmol/L (ref 135–145)

## 2019-05-28 LAB — GLUCOSE, CAPILLARY
Glucose-Capillary: 232 mg/dL — ABNORMAL HIGH (ref 70–99)
Glucose-Capillary: 199 mg/dL — ABNORMAL HIGH (ref 70–99)
Glucose-Capillary: 139 mg/dL — ABNORMAL HIGH (ref 70–99)
Glucose-Capillary: 94 mg/dL (ref 70–99)
Glucose-Capillary: 220 mg/dL — ABNORMAL HIGH (ref 70–99)

## 2019-05-28 LAB — SARS CORONAVIRUS 2 (TAT 6-24 HRS): SARS Coronavirus 2: NEGATIVE

## 2019-05-28 LAB — HEMOGLOBIN A1C
Hgb A1c MFr Bld: 9 % — ABNORMAL HIGH (ref 4.8–5.6)
Mean Plasma Glucose: 211.6 mg/dL

## 2019-05-28 MED ORDER — METRONIDAZOLE IN NACL 5-0.79 MG/ML-% IV SOLN
500.0000 mg | Freq: Three times a day (TID) | INTRAVENOUS | Status: DC
  Administered 2019-05-28 – 2019-05-29 (×3): 500 mg via INTRAVENOUS
  Filled 2019-05-28 (×3): qty 100

## 2019-05-28 MED ORDER — INSULIN ASPART 100 UNIT/ML ~~LOC~~ SOLN
0.0000 [IU] | Freq: Three times a day (TID) | SUBCUTANEOUS | Status: DC
  Administered 2019-05-28: 13:00:00 1 [IU] via SUBCUTANEOUS
  Administered 2019-05-28: 2 [IU] via SUBCUTANEOUS
  Administered 2019-05-29: 18:00:00 3 [IU] via SUBCUTANEOUS
  Administered 2019-05-29 (×2): 2 [IU] via SUBCUTANEOUS
  Administered 2019-05-30: 19:00:00 3 [IU] via SUBCUTANEOUS
  Administered 2019-05-30: 2 [IU] via SUBCUTANEOUS
  Administered 2019-05-31: 5 [IU] via SUBCUTANEOUS
  Administered 2019-05-31: 18:00:00 3 [IU] via SUBCUTANEOUS
  Administered 2019-05-31: 13:00:00 5 [IU] via SUBCUTANEOUS
  Administered 2019-06-01: 09:00:00 3 [IU] via SUBCUTANEOUS
  Administered 2019-06-01: 2 [IU] via SUBCUTANEOUS

## 2019-05-28 MED ORDER — SODIUM CHLORIDE 0.9 % IV SOLN
2.0000 g | INTRAVENOUS | Status: DC
  Administered 2019-05-28: 13:00:00 2 g via INTRAVENOUS
  Filled 2019-05-28: qty 20

## 2019-05-28 MED ORDER — VANCOMYCIN HCL IN DEXTROSE 1-5 GM/200ML-% IV SOLN
1000.0000 mg | Freq: Two times a day (BID) | INTRAVENOUS | Status: DC
  Administered 2019-05-28 (×2): 1000 mg via INTRAVENOUS
  Filled 2019-05-28 (×4): qty 200

## 2019-05-28 MED ORDER — PIPERACILLIN-TAZOBACTAM 3.375 G IVPB
3.3750 g | Freq: Three times a day (TID) | INTRAVENOUS | Status: DC
  Administered 2019-05-28: 07:00:00 3.375 g via INTRAVENOUS
  Filled 2019-05-28: qty 100
  Filled 2019-05-28: qty 50
  Filled 2019-05-28: qty 100

## 2019-05-28 NOTE — Progress Notes (Signed)
PHARMACY - PHYSICIAN COMMUNICATION CRITICAL VALUE ALERT - BLOOD CULTURE IDENTIFICATION (BCID)  Bryan Farrell is an 68 y.o. male who presented to [facility] on 05/27/2019 with a chief complaint of gangrenous left toe  Assessment: 68 yo male with gangrenous left toe, poorly controlled DM.  Afebrile, WBC WNL.  BCID today reveals 1/4 bottles with MRSA + enterococcus (vanc sensitive)  Name of physician (or Provider) Contacted: Aslam [phone])  Current antibiotics: Vancomycin and Ceftriaxone  Changes to prescribed antibiotics recommended:  Continue vancomycin, could consider stopping ceftriaxone.  Results for orders placed or performed during the hospital encounter of 05/27/19  Blood Culture ID Panel (Reflexed) (Collected: 05/27/2019  9:32 PM)  Result Value Ref Range   Enterococcus species DETECTED (A) NOT DETECTED   Vancomycin resistance NOT DETECTED NOT DETECTED   Listeria monocytogenes NOT DETECTED NOT DETECTED   Staphylococcus species DETECTED (A) NOT DETECTED   Staphylococcus aureus (BCID) NOT DETECTED NOT DETECTED   Methicillin resistance DETECTED (A) NOT DETECTED   Streptococcus species NOT DETECTED NOT DETECTED   Streptococcus agalactiae NOT DETECTED NOT DETECTED   Streptococcus pneumoniae NOT DETECTED NOT DETECTED   Streptococcus pyogenes NOT DETECTED NOT DETECTED   Acinetobacter baumannii NOT DETECTED NOT DETECTED   Enterobacteriaceae species NOT DETECTED NOT DETECTED   Enterobacter cloacae complex NOT DETECTED NOT DETECTED   Escherichia coli NOT DETECTED NOT DETECTED   Klebsiella oxytoca NOT DETECTED NOT DETECTED   Klebsiella pneumoniae NOT DETECTED NOT DETECTED   Proteus species NOT DETECTED NOT DETECTED   Serratia marcescens NOT DETECTED NOT DETECTED   Haemophilus influenzae NOT DETECTED NOT DETECTED   Neisseria meningitidis NOT DETECTED NOT DETECTED   Pseudomonas aeruginosa NOT DETECTED NOT DETECTED   Candida albicans NOT DETECTED NOT DETECTED   Candida glabrata  NOT DETECTED NOT DETECTED   Candida krusei NOT DETECTED NOT DETECTED   Candida parapsilosis NOT DETECTED NOT DETECTED   Candida tropicalis NOT DETECTED NOT DETECTED    Marguerite Olea, BCCP Clinical Pharmacist Phone [phone]  05/28/2019 7:28 PM

## 2019-05-28 NOTE — Progress Notes (Signed)
Left lower ext arterial duplex  has been completed. Refer to [facility] under chart review to view preliminary results.   05/28/2019  12:45 PM Bryan Farrell, Bonnye Fava

## 2019-05-28 NOTE — Consult Note (Addendum)
Hospital Consult    Reason for Consult: Dry gangrene left second toe Requesting Physician: Dr. Sharol Given MRN #:  [id number]  History of Present Illness: This is a 68 y.o. 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  He was evaluated by Dr. Sharol Given who recommends second toe amputation.  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  Patient denies any claudication or rest pain.  ABIs and arterial duplex pending.  Past Medical History:  Diagnosis Date  . Diabetes mellitus without complication ([place])   . Hypertension     Past Surgical History:  Procedure Laterality Date  . TOE AMPUTATION Right   . TONSILLECTOMY      No Known Allergies  Prior to Admission medications   Medication Sig Start Date End Date Taking? Authorizing Provider  acetaminophen (TYLENOL) 500 MG tablet Take 1 tablet (500 mg total) by mouth every 6 (six) hours as needed. 04/16/19   Domenic Moras, PA-C  gabapentin (NEURONTIN) 100 MG capsule Take 1 capsule (100 mg total) by mouth 3 (three) times daily. 04/16/19   Davonna Belling, MD    Social History   Socioeconomic History  . Marital status: Single    Spouse name: Not on file  . Number of children: Not on file  . Years of education: Not on file  . Highest education level: Not on file  Occupational History  . Not on file  Social Needs  . Financial resource strain: Not on file  . Food insecurity    Worry: Not on file    Inability: Not on file  . Transportation needs    Medical: Not on file    Non-medical: Not on file  Tobacco Use  . Smoking status: Never Smoker  . Smokeless tobacco: Never Used  Substance and Sexual Activity  . Alcohol use: Not Currently  . Drug use: No  . Sexual activity: Not Currently  Lifestyle  . Physical activity    Days per week: Not on file    Minutes per session: Not  on file  . Stress: Not on file  Relationships  . Social Herbalist on phone: Not on file    Gets together: Not on file    Attends religious service: Not on file    Active member of club or organization: Not on file    Attends meetings of clubs or organizations: Not on file    Relationship status: Not on file  . Intimate partner violence    Fear of current or ex partner: Not on file    Emotionally abused: Not on file    Physically abused: Not on file    Forced sexual activity: Not on file  Other Topics Concern  . Not on file  Social History Narrative  . Not on file    Family History  Problem Relation Age of Onset  . Heart Problems Mother   . Heart Problems Father     ROS: Otherwise negative unless mentioned in HPI  Physical Examination  Vitals:   05/28/19 0440 05/28/19 0727  BP: 107/63 114/70  Pulse: 66 71  Resp: (!) 21 18  Temp: 97.9 F (36.6 C) 98.6 F (37 C)  SpO2: 99% 98%   There is no height or weight on file to calculate BMI.  General:  WDWN in NAD Gait: Not observed HENT: WNL, normocephalic Pulmonary:  normal non-labored breathing Cardiac: regular Abdomen: soft, NT/ND, no masses Skin: without rashes Vascular Exam/Pulses: Palpable right femoral pulse, palpable left femoral pulse, palpable left popliteal pulse, faintly palpable left posterior tibial artery pulse, unable to palpate left AT or DP Extremities: Dry gangrene left second toe Musculoskeletal: no muscle wasting or atrophy  Neurologic: A&O X 3 Psychiatric:  The pt has Normal affect. Lymph:  Unremarkable  CBC    Component Value Date/Time   WBC 8.3 05/28/2019 0357   RBC 3.43 (L) 05/28/2019 0357   HGB 10.2 (L) 05/28/2019 0357   HCT 31.0 (L) 05/28/2019 0357   PLT 487 (H) 05/28/2019 0357   MCV 90.4 05/28/2019 0357   MCH 29.7 05/28/2019 0357   MCHC 32.9 05/28/2019 0357   RDW 12.9 05/28/2019 0357   LYMPHSABS 1.3 05/27/2019 1355   MONOABS 0.7 05/27/2019 1355   EOSABS 0.1 05/27/2019  1355   BASOSABS 0.1 05/27/2019 1355    BMET    Component Value Date/Time   NA 141 05/28/2019 0357   K 3.4 (L) 05/28/2019 0357   CL 108 05/28/2019 0357   CO2 23 05/28/2019 0357   GLUCOSE 226 (H) 05/28/2019 0357   BUN 12 05/28/2019 0357   CREATININE 0.46 (L) 05/28/2019 0357   CALCIUM 8.4 (L) 05/28/2019 0357   GFRNONAA >60 05/28/2019 0357   GFRAA >60 05/28/2019 0357    COAGS: No results found for: INR, PROTIME   Non-Invasive Vascular Imaging:   ABI/TBI, and arterial duplex pending    ASSESSMENT/PLAN: This is a 68 y.o. male with dry gangrene left second toe, plain film suggesting osteomyelitis  Patient has a palpable left popliteal pulse and faintly palpable left posterior tibial pulse We will start with ABI/TBI and arterial duplex of left lower extremity We will await  these results prior to moving forward with left second toe amputation On-call vascular surgeon Dr. early will evaluate patient later today and provide further treatment plans   Emilie Rutter PA-C [facility] [phone]  I have examined the patient, reviewed and agree with above. Palp left PT pulse ABI normal with triphasic PT signal and monophasic DP signal. No need for futher vascular elav  Gretta Began, MD 05/28/2019 3:24 PM

## 2019-05-28 NOTE — ED Notes (Signed)
In and Out cath performed. 195ml obtained

## 2019-05-28 NOTE — Care Management (Signed)
CM consult for housing situation and PCP acknowledged. Patient PCP is listed as Jack Quarto, NP; patient was residing at [facility]. CM will inquire if patient can return to [facility] post-discharge and will continue to follow for DCP needs. Patient has Medicare/Medicaid insurance if it's determined that ST SNF is needed.  Midge Minium RN, BSN, NCM-BC, ACM-RN [phone]

## 2019-05-28 NOTE — Progress Notes (Signed)
Patient ID: Bryan Farrell, male   DOB: 02/15/1951, 67 y.o.   MRN: [id number] Patient is seen in follow-up for gangrene left foot second toe.  Patient has been seen by vascular vein surgery.  He does have adequate circulation on the posterior tibial artery with poor circulation on the dorsalis pedis artery.  Plan for surgery on Friday with amputation of the left foot second ray.  Patient will need to be n.p.o. after midnight Thursday. 

## 2019-05-28 NOTE — Evaluation (Addendum)
Physical Therapy Evaluation Patient Details Name: Bryan Farrell MRN: [id number] DOB: [birth year] Today's Date: 05/28/2019   History of Present Illness  Pt is a 68 y/o male admitted secondary to dry gangrene of the L second toe. Ortho and Vascular both consulted, work-up in progress. PMH including but not limited to DM and HTN.    Clinical Impression  Pt presented supine in bed with HOB elevated, awake and willing to participate in therapy session. Prior to admission, pt reported that he ambulated with use of a cane and was independent with ADLs. Pt with memory difficulties and unable to provide any information regarding his living environment (recently homeless but had moved into a house?). He stated that he has been having difficulties with his memory lately. He was also very concerned that he had not spoken to any of his family since being admitted to the hospital. PT assisted pt with calling his daughter and his son, neither of which answered the phone. At the time of evaluation, pt at min guard level for all mobility with use of a RW to ambulate. Pt would continue to benefit from skilled physical therapy services at this time while admitted and after d/c to address the below listed limitations in order to improve overall safety and independence with functional mobility.     Follow Up Recommendations SNF    Equipment Recommendations  Rolling walker with 5" wheels    Recommendations for Other Services       Precautions / Restrictions Precautions Precautions: Fall Restrictions Weight Bearing Restrictions: No      Mobility  Bed Mobility Overal bed mobility: Needs Assistance Bed Mobility: Supine to Sit     Supine to sit: Min guard     General bed mobility comments: use of bed rails, HOB flat, min guard for safety  Transfers Overall transfer level: Needs assistance Equipment used: Rolling walker (2 wheeled) Transfers: Sit to/from Stand Sit to Stand: Min guard          General transfer comment: good technique utilized, min guard for safety with transition  Ambulation/Gait Ambulation/Gait assistance: Counsellor (Feet): 60 Feet Assistive device: Rolling walker (2 wheeled) Gait Pattern/deviations: Step-through pattern;Decreased stride length Gait velocity: decreased   General Gait Details: pt overall steady with use of RW, occasional cueing needed to maintain proximity to AD; no LOB or need for physical assistance, min guard for safety  Stairs            Wheelchair Mobility    Modified Rankin (Stroke Patients Only)       Balance Overall balance assessment: Needs assistance Sitting-balance support: Feet supported Sitting balance-Leahy Scale: Good     Standing balance support: Single extremity supported;Bilateral upper extremity supported Standing balance-Leahy Scale: Poor                               Pertinent Vitals/Pain Pain Assessment: No/denies pain    Home Living Family/patient expects to be discharged to:: Shelter/Homeless                 Additional Comments: pt reporting that he was homeless but had recently moved into a house. He was unsure of where it was or any of the details regarding the house. He stated that his memory has not been good lately.    Prior Function Level of Independence: Independent with assistive device(s)         Comments: has been ambulating with  a cane     Hand Dominance        Extremity/Trunk Assessment   Upper Extremity Assessment Upper Extremity Assessment: Defer to OT evaluation;Overall [facility] for tasks assessed    Lower Extremity Assessment Lower Extremity Assessment: Overall WFL for tasks assessed       Communication   Communication: No difficulties  Cognition Arousal/Alertness: Awake/alert Behavior During Therapy: Flat affect Overall Cognitive Status: Impaired/Different from baseline Area of Impairment: Memory                      Memory: Decreased short-term memory                General Comments      Exercises     Assessment/Plan    PT Assessment Patient needs continued PT services  PT Problem List Decreased balance;Decreased mobility;Decreased coordination;Decreased cognition;Decreased knowledge of use of DME;Decreased safety awareness;Decreased knowledge of precautions       PT Treatment Interventions DME instruction;Gait training;Stair training;Functional mobility training;Therapeutic activities;Therapeutic exercise;Balance training;Neuromuscular re-education;Cognitive remediation;Patient/family education    PT Goals (Current goals can be found in the Care Plan section)  Acute Rehab PT Goals Patient Stated Goal: to talk to his son or daughter PT Goal Formulation: With patient Time For Goal Achievement: 06/11/19 Potential to Achieve Goals: Good    Frequency Min 3X/week   Barriers to discharge Decreased caregiver support;Inaccessible home environment;Other (comment)(?homeless)      Co-evaluation               AM-PAC PT "6 Clicks" Mobility  Outcome Measure Help needed turning from your back to your side while in a flat bed without using bedrails?: None Help needed moving from lying on your back to sitting on the side of a flat bed without using bedrails?: None Help needed moving to and from a bed to a chair (including a wheelchair)?: A Little Help needed standing up from a chair using your arms (e.g., wheelchair or bedside chair)?: A Little Help needed to walk in hospital room?: A Little Help needed climbing 3-5 steps with a railing? : A Little 6 Click Score: 20    End of Session Equipment Utilized During Treatment: Gait belt Activity Tolerance: Patient tolerated treatment well Patient left: in bed;with call bell/phone within reach;with bed alarm set;Other (comment)(sitting upright at EOB) Nurse Communication: Mobility status PT Visit Diagnosis: Other abnormalities of gait and  mobility (R26.89)    Time: 0488-8916 PT Time Calculation (min) (ACUTE ONLY): 21 min   Charges:   PT Evaluation $PT Eval Moderate Complexity: 1 Mod          Deborah Chalk, PT, DPT  Acute Rehabilitation Services Pager [phone] Office [phone]    Alessandra Bevels Brittiney Dicostanzo 05/28/2019, 11:52 AM

## 2019-05-28 NOTE — Progress Notes (Signed)
ABI completed. Refer to "CV Proc" under chart review to view preliminary results.  05/28/2019 12:27 PM Maudry Mayhew, MHA, RVT, RDCS, RDMS

## 2019-05-28 NOTE — Evaluation (Signed)
Occupational Therapy Evaluation Patient Details Name: Bryan Farrell MRN: [id number] DOB: September 20, 1950 Today's Date: 05/28/2019    History of Present Illness Pt is a 67 y/o male admitted secondary to dry gangrene of the L second toe. Ortho and Vascular both consulted, work-up in progress. PMH including but not limited to DM, HTN, vascular dementia, major depression, suicidal ideation, protein calorie malnutrition. Pt has had 10 ED admissions in 6 months.   Clinical Impression   Pt was ambulating with a SBQC prior to admission and independent in self care. He is homeless and was going to the [employer] to perform ADL. Pt presents with L LE pain, impaired cognition and decreased standing balance. He voiced desire to die several times throughout session, RN notified. Pt requires min guard assist for ambulation and set up to min guard assist for ADL. Recommending SNF.    Follow Up Recommendations  SNF;Supervision/Assistance - 24 hour    Equipment Recommendations       Recommendations for Other Services       Precautions / Restrictions Precautions Precautions: Fall Restrictions Weight Bearing Restrictions: No      Mobility Bed Mobility Overal bed mobility: Modified Independent Bed Mobility: Supine to Sit     Supine to sit: Min guard     General bed mobility comments: increased time, HOB flat  Transfers Overall transfer level: Needs assistance Equipment used: Quad cane Transfers: Sit to/from Stand Sit to Stand: Min guard         General transfer comment: min guard for safety    Balance Overall balance assessment: Needs assistance Sitting-balance support: Feet supported Sitting balance-Leahy Scale: Good Sitting balance - Comments: no LOB with donning sock   Standing balance support: Single extremity supported Standing balance-Leahy Scale: Poor                             ADL either performed or assessed with clinical judgement    ADL Overall ADL's : Needs assistance/impaired Eating/Feeding: Set up;Sitting   Grooming: Min guard;Standing   Upper Body Bathing: Supervision/ safety;Set up;Sitting   Lower Body Bathing: Min guard;Sit to/from stand   Upper Body Dressing : Set up;Sitting   Lower Body Dressing: Min guard;Sit to/from stand;Set up;Sitting/lateral leans Lower Body Dressing Details (indicate cue type and reason): donned sock without difficulty Toilet Transfer: Min guard;Ambulation   Toileting- Clothing Manipulation and Hygiene: Min guard;Sit to/from stand       Functional mobility during ADLs: Min guard;Cane       Vision Baseline Vision/History: Wears glasses Wears Glasses: Reading only Patient Visual Report: No change from baseline       Perception     Praxis      Pertinent Vitals/Pain Pain Assessment: Faces Faces Pain Scale: Hurts even more Pain Location: L foot with weight bearing Pain Descriptors / Indicators: Grimacing;Guarding Pain Intervention(s): Monitored during session;Repositioned     Hand Dominance Right   Extremity/Trunk Assessment Upper Extremity Assessment Upper Extremity Assessment: Overall WFL for tasks assessed   Lower Extremity Assessment Lower Extremity Assessment: Defer to PT evaluation   Cervical / Trunk Assessment Cervical / Trunk Assessment: Kyphotic   Communication Communication Communication: No difficulties   Cognition Arousal/Alertness: Awake/alert Behavior During Therapy: Flat affect Overall Cognitive Status: No family/caregiver present to determine baseline cognitive functioning Area of Impairment: Memory;Orientation;Safety/judgement;Problem solving                 Orientation Level: Disoriented to;Situation;Place   Memory:  Decreased short-term memory   Safety/Judgement: Decreased awareness of safety;Decreased awareness of deficits   Problem Solving: Requires verbal cues General Comments: pt unaware of how to use call button/TV  remote, required repetition for reason why he is NPO, pt did not initiate coming to the hospital with his gangrenous toe   General Comments       Exercises     Shoulder Instructions      Home Living Family/patient expects to be discharged to:: Shelter/Homeless                                 Additional Comments: pt is a poor historian, recalled going to the [facility] to take a shower and then suddenly being at the hospital      Prior Functioning/Environment Level of Independence: Independent with assistive device(s)        Comments: has been ambulating with a SBQC        OT Problem List: Impaired balance (sitting and/or standing);Decreased cognition;Decreased safety awareness;Pain      OT Treatment/Interventions: Self-care/ADL training;DME and/or AE instruction;Cognitive remediation/compensation;Patient/family education;Balance training    OT Goals(Current goals can be found in the care plan section) Acute Rehab OT Goals Patient Stated Goal: to talk to his son or daughter OT Goal Formulation: With patient Time For Goal Achievement: 06/11/19 Potential to Achieve Goals: Good ADL Goals Pt Will Perform Grooming: with modified independence;standing Pt Will Perform Lower Body Bathing: with modified independence;sit to/from stand Pt Will Perform Lower Body Dressing: with modified independence;sit to/from stand Pt Will Transfer to Toilet: with modified independence;ambulating Pt Will Perform Toileting - Clothing Manipulation and hygiene: with modified independence;sit to/from stand Additional ADL Goal #1: Pt will use written strategies to aid his memory with minimal verbal cues.  OT Frequency: Min 2X/week   Barriers to D/C: Decreased caregiver support          Co-evaluation              AM-PAC OT "6 Clicks" Daily Activity     Outcome Measure Help from another person eating meals?: None Help from another person taking care of personal grooming?: A  Little Help from another person toileting, which includes using toliet, bedpan, or urinal?: A Little Help from another person bathing (including washing, rinsing, drying)?: A Little Help from another person to put on and taking off regular upper body clothing?: None Help from another person to put on and taking off regular lower body clothing?: A Little 6 Click Score: 20   End of Session Equipment Utilized During Treatment: Other (comment)(quad cane) Nurse Communication: Other (comment)(pt with comments about wanting to die)  Activity Tolerance: Patient tolerated treatment well Patient left: in chair;with call bell/phone within reach;with chair alarm set  OT Visit Diagnosis: Unsteadiness on feet (R26.81);Other abnormalities of gait and mobility (R26.89);Pain;Other symptoms and signs involving cognitive function                Time: 1255-1317 OT Time Calculation (min): 22 min Charges:  OT General Charges $OT Visit: 1 Visit OT Evaluation $OT Eval Moderate Complexity: 1 Mod  Nestor Lewandowsky, OTR/L Acute Rehabilitation Services Pager: [phone] Office: [phone]  Malka So 05/28/2019, 1:43 PM

## 2019-05-28 NOTE — Progress Notes (Addendum)
   NAME:  Bryan Farrell, MRN:  [id number], DOB:  26-Feb-1951, LOS: 1 ADMISSION DATE:  05/27/2019,  Primary: Placey, Audrea Muscat, NP  CHIEF COMPLAINT:  Foot pain  Medical Service: Internal Medicine Teaching Service         Attending Physician: Dr. Velna Ochs, MD    First Contact: Dr. Darrick Meigs Pager: [phone]  Second Contact: Dr. 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  Subjective  No complaints  Significant Hospital Events   10/13 Admission   Objective   Blood pressure 107/63, pulse 66, temperature 97.9 F (36.6 C), temperature source Oral, resp. rate (!) 21, SpO2 99 %.     Intake/Output Summary (Last 24 hours) at 05/28/2019 0724 Last data filed at 05/28/2019 0651 Gross per 24 hour  Intake 0 ml  Output 150 ml  Net -150 ml   Examination: GENERAL: in no acute distress CARDIAC: heart RRR. Extremities warm. Peripheral pulses intact PULMONARY: acyanotic. Lung sounds clear to auscultation. ABDOMEN: soft. Nontender to palpation.  Nondistended.  NEURO: CN II-XII grossly intact. SKIN: necrosis of left 2nd toe extending to base. PSYCH: A/Ox3. Normal affect  Consults:  Ortho Care management  Significant Diagnostic Tests:  10/13 left foot xray: findings consistent with osteomyelitis of proximal phalynx   Micro Data:  10/13 Blood cultures>>  Antimicrobials:  Zosyn 10/13>> vanc 10/13>> Labs    CBC Latest Ref Rng & Units 05/28/2019 05/27/2019 04/18/2019  WBC 4.0 - 10.5 K/uL 8.3 9.2 6.5  Hemoglobin 13.0 - 17.0 g/dL 10.2(L) 11.7(L) 11.9(L)  Hematocrit 39.0 - 52.0 % 31.0(L) 35.6(L) 37.1(L)  Platelets 150 - 400 K/uL 487(H) 561(H) 73    Summary  68 yo  male who presented with osteomyeltis of left 2nd toe.  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 Likely 2/2 poorly controlled DM II. Peripheral pulses intact. Ortho on board. Will likely need amputation. VSS. No leukocytosis. Blood cultures pending. Lactate normal. Vascular studies indicate sufficient circulation Plan Continue zosyn and vanc PT/OT Left 2nd toe amputation planned for Friday.   Poorly controlled DM II. A1C 9.0. continue SSI. CBGs. Will need to adjust preopertively.  Best practice:  CODE STATUS: full Diet: DM Pain management: tylenol DVT for prophylaxis: lovenox. Social considerations/Family communication: resides at a homeless shelter. He had stated on admission that he may not be able to return there. SW/case management on board. Dispo: pending medical stabilization.   Mitzi Hansen, MD INTERNAL MEDICINE RESIDENT PGY-1 PAGER #: [phone] 05/28/19 7:24 AM

## 2019-05-28 NOTE — Consult Note (Addendum)
ORTHOPAEDIC CONSULTATION  REQUESTING PHYSICIAN: Reymundo Poll, MD  Chief Complaint: Black gangrene left foot second toe.  HPI: Bryan Farrell is a 68 y.o. male who presents with peripheral vascular disease with black gangrenous changes of the second toe which patient states is been there for weeks.  Patient has type 2 diabetes uncontrolled with protein caloric malnutrition as well as history of hypertension.  Past Medical History:  Diagnosis Date  . Diabetes mellitus without complication (HCC)   . Hypertension    Past Surgical History:  Procedure Laterality Date  . TOE AMPUTATION Right   . TONSILLECTOMY     Social History   Socioeconomic History  . Marital status: Single    Spouse name: Not on file  . Number of children: Not on file  . Years of education: Not on file  . Highest education level: Not on file  Occupational History  . Not on file  Social Needs  . Financial resource strain: Not on file  . Food insecurity    Worry: Not on file    Inability: Not on file  . Transportation needs    Medical: Not on file    Non-medical: Not on file  Tobacco Use  . Smoking status: Never Smoker  . Smokeless tobacco: Never Used  Substance and Sexual Activity  . Alcohol use: Not Currently  . Drug use: No  . Sexual activity: Not Currently  Lifestyle  . Physical activity    Days per week: Not on file    Minutes per session: Not on file  . Stress: Not on file  Relationships  . Social Musician on phone: Not on file    Gets together: Not on file    Attends religious service: Not on file    Active member of club or organization: Not on file    Attends meetings of clubs or organizations: Not on file    Relationship status: Not on file  Other Topics Concern  . Not on file  Social History Narrative  . Not on file   Family History  Problem Relation Age of Onset  . Heart Problems Mother   . Heart Problems Father    - negative except otherwise stated  in the family history section No Known Allergies Prior to Admission medications   Medication Sig Start Date End Date Taking? Authorizing Provider  acetaminophen (TYLENOL) 500 MG tablet Take 1 tablet (500 mg total) by mouth every 6 (six) hours as needed. 04/16/19   Fayrene Helper, PA-C  gabapentin (NEURONTIN) 100 MG capsule Take 1 capsule (100 mg total) by mouth 3 (three) times daily. 04/16/19   Benjiman Core, MD   Dg Foot Complete Left  Result Date: 05/27/2019 CLINICAL DATA:  Wound infection EXAM: LEFT FOOT - COMPLETE 3+ VIEW COMPARISON:  None. FINDINGS: Loss of cortical definition along the distal aspect of the proximal second phalanx. This loss of cortical definition occurs along the medial border of the metaphysis. Small amount gas in the subcutaneous tissue adjacent to the middle phalanx of same digit. IMPRESSION: Potential osteomyelitis involving proximal phalanx second digit along the distal medial cortex. Electronically Signed   By: Genevive Bi M.D.   On: 05/27/2019 14:45   - pertinent xrays, CT, MRI studies were reviewed and independently interpreted  Positive ROS: All other systems have been reviewed and were otherwise negative with the exception of those mentioned in the HPI and as above.  Physical Exam: General: Alert, no acute distress Psychiatric:  Patient is competent for consent with normal mood and affect Lymphatic: No axillary or cervical lymphadenopathy Cardiovascular: No pedal edema Respiratory: No cyanosis, no use of accessory musculature GI: No organomegaly, abdomen is soft and non-tender    Images:  @ENCIMAGES @  Labs:  Lab Results  Component Value Date   HGBA1C 9.0 (H) 05/28/2019    Lab Results  Component Value Date   ALBUMIN 3.1 (L) 05/27/2019   ALBUMIN 3.8 04/18/2019   ALBUMIN 3.7 04/01/2019    Neurologic: Patient does not have protective sensation bilateral lower extremities.   MUSCULOSKELETAL:   Skin: Examination patient has black gangrenous  changes to the entire left foot second toe.  There is no ascending cellulitis.  Patient does not have a palpable dorsalis pedis pulse.  A Doppler was used and he has a dampened monophasic dorsalis pedis pulse.  Patient's hemoglobin A1c is 9.0 albumin 3.1.  Assessment: Assessment: Diabetic insensate neuropathy with protein caloric malnutrition and hypertension with peripheral vascular disease and dry gangrene of the second toe left foot.  Plan: Plan: Will consult vascular vein surgery.  Patient ideally needs a work-up to see if there are revascularization options for the left lower extremity.  At this point, patient would not heal an amputation of the second toe.  I have placed an order for ankle-brachial indices and carb modified diet.  Thank you for the consult and the opportunity to see [name], [place] [phone] 7:09 AM

## 2019-05-28 NOTE — Progress Notes (Signed)
Inpatient Diabetes Program Recommendations  AACE/ADA: New Consensus Statement on Inpatient Glycemic Control (2015)  Target Ranges:  Prepandial:   less than 140 mg/dL      Peak postprandial:   less than 180 mg/dL (1-2 hours)      Critically ill patients:  140 - 180 mg/dL   Lab Results  Component Value Date   GLUCAP 199 (H) 05/28/2019   HGBA1C 9.0 (H) 05/28/2019    Review of Glycemic Control Results for ERICO, STAN (MRN [id number]) as of 05/28/2019 12:03  Ref. Range 05/28/2019 02:08 05/28/2019 06:54  Glucose-Capillary Latest Ref Range: 70 - 99 mg/dL 232 (H) 199 (H)   Diabetes history: DM 2 Outpatient Diabetes medications:  None ( has been on Metformin in the past) Current orders for Inpatient glycemic control:  Novolog sensitive tid with meals  Inpatient Diabetes Program Recommendations:    May consider adding Lantus 15 units daily while in the hospital.  Consider restart of oral metformin at d/c.   Thanks,  Adah Perl, RN, BC-ADM Inpatient Diabetes Coordinator Pager [phone] (8a-5p)

## 2019-05-28 NOTE — H&P (View-Only) (Signed)
Patient ID: Bryan Farrell, male   DOB: 08/03/1951, 68 y.o.   MRN: [id number] Patient is seen in follow-up for gangrene left foot second toe.  Patient has been seen by vascular vein surgery.  He does have adequate circulation on the posterior tibial artery with poor circulation on the dorsalis pedis artery.  Plan for surgery on Friday with amputation of the left foot second ray.  Patient will need to be n.p.o. after midnight Thursday.

## 2019-05-28 NOTE — Progress Notes (Signed)
Pharmacy Antibiotic Note  Bryan Farrell is a 68 y.o. male admitted on 05/27/2019 with lower extremity osteomyelitis .  Pharmacy has been consulted for Vancomycin dosing. WBC WNL. Renal function OK.   Plan: Vancomycin 1000 mg IV q12h >>Estimated AUC: 457 Zosyn 3.375G IV q8h to be infused over 4 hours per MD Trend WBC, temp, renal function  F/U infectious work-up Drug levels as indicated  Temp (24hrs), Avg:98.5 F (36.9 C), Min:97.9 F (36.6 C), Max:98.7 F (37.1 C)  Recent Labs  Lab 05/27/19 1355 05/27/19 2133  WBC 9.2  --   CREATININE 1.05  --   LATICACIDVEN 1.9 1.9    CrCl cannot be calculated (Unknown ideal weight.).    No Known Allergies  Narda Bonds, PharmD, BCPS Clinical Pharmacist Phone: [phone]

## 2019-05-29 ENCOUNTER — Inpatient Hospital Stay (HOSPITAL_COMMUNITY): Payer: Medicare Other

## 2019-05-29 DIAGNOSIS — Z89411 Acquired absence of right great toe: Secondary | ICD-10-CM

## 2019-05-29 DIAGNOSIS — Z89421 Acquired absence of other right toe(s): Secondary | ICD-10-CM

## 2019-05-29 DIAGNOSIS — M869 Osteomyelitis, unspecified: Secondary | ICD-10-CM

## 2019-05-29 DIAGNOSIS — B952 Enterococcus as the cause of diseases classified elsewhere: Secondary | ICD-10-CM

## 2019-05-29 DIAGNOSIS — I361 Nonrheumatic tricuspid (valve) insufficiency: Secondary | ICD-10-CM

## 2019-05-29 DIAGNOSIS — I340 Nonrheumatic mitral (valve) insufficiency: Secondary | ICD-10-CM

## 2019-05-29 DIAGNOSIS — R7881 Bacteremia: Secondary | ICD-10-CM

## 2019-05-29 DIAGNOSIS — F329 Major depressive disorder, single episode, unspecified: Secondary | ICD-10-CM

## 2019-05-29 DIAGNOSIS — I96 Gangrene, not elsewhere classified: Secondary | ICD-10-CM

## 2019-05-29 DIAGNOSIS — I10 Essential (primary) hypertension: Secondary | ICD-10-CM

## 2019-05-29 DIAGNOSIS — E1169 Type 2 diabetes mellitus with other specified complication: Secondary | ICD-10-CM

## 2019-05-29 DIAGNOSIS — Z590 Homelessness: Secondary | ICD-10-CM

## 2019-05-29 DIAGNOSIS — F0150 Vascular dementia without behavioral disturbance: Secondary | ICD-10-CM

## 2019-05-29 LAB — BASIC METABOLIC PANEL
Anion gap: 7 (ref 5–15)
BUN: 9 mg/dL (ref 8–23)
CO2: 26 mmol/L (ref 22–32)
Calcium: 8.2 mg/dL — ABNORMAL LOW (ref 8.9–10.3)
Chloride: 105 mmol/L (ref 98–111)
Creatinine, Ser: 0.87 mg/dL (ref 0.61–1.24)
GFR calc Af Amer: >60 mL/min (ref >60)
GFR calc non Af Amer: >60 mL/min (ref >60)
Glucose, Bld: 209 mg/dL — ABNORMAL HIGH (ref 70–99)
Potassium: 3.9 mmol/L (ref 3.5–5.1)
Sodium: 138 mmol/L (ref 135–145)

## 2019-05-29 LAB — CBC
HCT: 30.1 % — ABNORMAL LOW (ref 39.0–52.0)
Hemoglobin: 10 g/dL — ABNORMAL LOW (ref 13.0–17.0)
MCH: 30 pg (ref 26.0–34.0)
MCHC: 33.2 g/dL (ref 30.0–36.0)
MCV: 90.4 fL (ref 80.0–100.0)
Platelets: 447 10*3/uL — ABNORMAL HIGH (ref 150–400)
RBC: 3.33 MIL/uL — ABNORMAL LOW (ref 4.22–5.81)
RDW: 12.8 % (ref 11.5–15.5)
WBC: 8.9 10*3/uL (ref 4.0–10.5)
nRBC: 0 % (ref 0.0–0.2)

## 2019-05-29 LAB — GLUCOSE, CAPILLARY
Glucose-Capillary: 199 mg/dL — ABNORMAL HIGH (ref 70–99)
Glucose-Capillary: 182 mg/dL — ABNORMAL HIGH (ref 70–99)
Glucose-Capillary: 221 mg/dL — ABNORMAL HIGH (ref 70–99)
Glucose-Capillary: 125 mg/dL — ABNORMAL HIGH (ref 70–99)

## 2019-05-29 LAB — ECHOCARDIOGRAM COMPLETE
Height: 72 in
Weight: 3121.71 oz

## 2019-05-29 LAB — SURGICAL PCR SCREEN
MRSA, PCR: NEGATIVE
Staphylococcus aureus: NEGATIVE

## 2019-05-29 MED ORDER — CHLORHEXIDINE GLUCONATE 4 % EX LIQD
60.0000 mL | Freq: Once | CUTANEOUS | Status: AC
  Administered 2019-05-30: 4 via TOPICAL
  Filled 2019-05-29: qty 60

## 2019-05-29 MED ORDER — SODIUM CHLORIDE 0.9 % IV SOLN
3.0000 g | Freq: Four times a day (QID) | INTRAVENOUS | Status: DC
  Administered 2019-05-29 – 2019-06-03 (×19): 3 g via INTRAVENOUS
  Filled 2019-05-29 (×9): qty 8
  Filled 2019-05-29: qty 3
  Filled 2019-05-29 (×2): qty 8
  Filled 2019-05-29: qty 3
  Filled 2019-05-29 (×2): qty 8
  Filled 2019-05-29: qty 3
  Filled 2019-05-29 (×2): qty 8
  Filled 2019-05-29 (×3): qty 3
  Filled 2019-05-29 (×2): qty 8
  Filled 2019-05-29 (×3): qty 3

## 2019-05-29 MED ORDER — CEFAZOLIN SODIUM-DEXTROSE 2-4 GM/100ML-% IV SOLN
2.0000 g | INTRAVENOUS | Status: AC
  Administered 2019-05-30: 14:00:00 2 g via INTRAVENOUS
  Filled 2019-05-29: qty 100

## 2019-05-29 NOTE — Plan of Care (Signed)

## 2019-05-29 NOTE — Progress Notes (Signed)
   NAME:  Bryan Farrell, MRN:  [id number], DOB:  [birth year], LOS: 2 ADMISSION DATE:  05/27/2019,  Primary: Placey, Audrea Muscat, NP  CHIEF COMPLAINT:  Foot pain  Medical Service: Internal Medicine Teaching Service         Attending Physician: Dr. Velna Ochs, MD    First Contact: Dr. Darrick Meigs Pager: [phone]  Second Contact: Dr. 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  Subjective  No complaints.  Eating breakfast  Significant Hospital Events   10/13 Admission   Objective   Blood pressure 106/68, pulse (!) 57, temperature 98.5 F (36.9 C), temperature source Oral, resp. rate 15, height 6' (1.829 m), weight 88.5 kg, SpO2 95 %.     Intake/Output Summary (Last 24 hours) at 05/29/2019 0550 Last data filed at 05/29/2019 0428 Gross per 24 hour  Intake 913.49 ml  Output -  Net 913.49 ml   Examination: GENERAL: in no acute distress CARDIAC: heart RRR. Extremities warm. Peripheral pulses intact PULMONARY: acyanotic. Lung sounds clear to auscultation. ABDOMEN: soft. Nontender to palpation.  Nondistended.  NEURO: CN II-XII grossly intact. SKIN: necrosis of left 2nd toe extending to base.  Consults:  Ortho Care management Infectious disease  Significant Diagnostic Tests:  10/13 left foot xray: findings consistent with osteomyelitis of proximal phalynx   Micro Data:  10/13 Blood cultures>> enterococcus 10/15 blood cultures>>  Antimicrobials:  Zosyn 10/13>>10/14 vanc 10/13>>10/15 Flagyl 10/14>>10/15 Ceftriaxone 10/14>>10/15 Unasyn 10/15>> Labs    CBC Latest Ref Rng & Units 05/28/2019 05/27/2019 04/18/2019  WBC 4.0 - 10.5 K/uL 8.3 9.2 6.5  Hemoglobin  13.0 - 17.0 g/dL 10.2(L) 11.7(L) 11.9(L)  Hematocrit 39.0 - 52.0 % 31.0(L) 35.6(L) 37.1(L)  Platelets 150 - 400 K/uL 487(H) 561(H) 2    Summary  68 yo male who presented with osteomyeltis of left 2nd toe and subsequently found to have enterococci bacteremia  Assessment & Plan:  Active Problems:   Osteomyelitis ([place])   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 Plan Discontinue ceftriaxone, Flagyl, vancomycin We will start Unasyn PT/OT Left 2nd toe amputation planned for Friday.  Enterococcus bacteremia.  Noted on blood cultures from 10/13.  Infectious disease consulted.  Remains afebrile.  Vital signs stable.  Patient placed on Unasyn  Poorly controlled DM II. A1C 9.0. continue SSI. CBGs. Will need to adjust preopertively.  Best practice:  CODE STATUS: full Diet: DM Pain management: tylenol DVT for prophylaxis: lovenox. Social considerations/Family communication: resides at a homeless shelter. He had stated on admission that he may not be able to return there. SW/case management on board. Dispo: pending medical stabilization.   Mitzi Hansen, MD INTERNAL MEDICINE RESIDENT PGY-1 PAGER #: [phone] 05/29/19 5:50 AM

## 2019-05-29 NOTE — Progress Notes (Signed)
Bryan Farrell for Infectious Disease    Date of Admission:  05/27/2019     Total days of antibiotics 3               Reason for Consult: Enterococcus Bacteremia  Referring Provider: [employer] [facility] Primary Care Provider: Marliss Coots, Bryan Farrell   ASSESSMENT:  Bryan Farrell is a 68 y/o male with poorly controlled diabetes presenting with osteomyelitis and gangrene of his left second toe complicated by enterococcus bacteremia. Culture was also positive for Coagulase negative staph which is likely a contaminant as it was in 1/4 bottles. He is scheduled for 2nd ray amputation tomorrow. Most likely source being his left 2nd toe. Healing likely to be complicated by poorly controlled diabetes as well as socially he is homeless. Will check TTE to rule out endocarditis and repeat blood cultures. Likely to need at least 2 weeks of antibiotic therapy and possibly up to 6 weeks. He may be an ideal candidate for delbavancin/oritavancin which would provide long acting coverage and also avoid a PICC line. Further disposition planning to be determined.   PLAN:  1. Continue Unasyn pending sensitivities of enterococcus. 2. Echocardiogram to rule out endocarditis. 3. Repeat blood cultures 4. Blood sugar control per primary team. 5. May need social work for assistance.   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 enoxaparin (LOVENOX) injection  40 mg Subcutaneous Daily  . insulin aspart  0-9 Units Subcutaneous TID WC     HPI: Bryan Farrell is a 68 y.o. male with previous medical history of hypertension, type 2 diabetes, vascular dementia, right great toe amputation, and major depressive disorder presenting to the ED with a gangrenous left second toe of unclear duration. Sent from [facility] when staff noted his right second toe was gangrenous.   Bryan Farrell  has been seen in the ED on multiple occasions with bilateral foot pain since August of this year. Most recently on 04/16/19 and diagnosed with neuropathy and given prescription for Neurontin. Upon arrival to the ED he was afebrile and without leukocytosis. X-ray imaging with potential osteomyelitis involving the proximal phalanx second digit along the distal medial cortex. He was hypotensive with code sepsis initiated. Blood cultures drawn were positive for Enterococcus species and  In 1/4 bottles Coag negative Staph with oxacillin resistance.   Bryan Farrell has remained afebrile since admission and without leukocytosis. Initially received vancomycin and piperacillin-tazobactam which was narrowed to cefepime and eventually ceftriaxone, metronidazole and vancomycin. He is scheduled for left 2nd ray amputation tomorrow.   Bryan Farrell is currently homeless and has been staying at a shelter. Denies any allergies to medication that he is aware of and has not been on any antibiotics at present. He does continue to have bilateral foot pain.    Review of Systems: Review of Systems  Constitutional: Negative for chills, fever, malaise/fatigue and weight loss.  Respiratory: Negative for cough, shortness of breath and wheezing.   Cardiovascular: Negative for chest pain and leg swelling.  Gastrointestinal: Negative for abdominal pain, constipation, diarrhea, nausea and vomiting.  Musculoskeletal:       Positive for bilateral foot pain  Skin: Negative for rash.     Past Medical History:  Diagnosis Date  . Diabetes mellitus without complication ([place])   . Gangrene ([place]) 05/28/2019   left second toe  .  Hypertension     Social History   Tobacco Use  . Smoking status: Never Smoker  . Smokeless tobacco: Never Used  Substance Use Topics  . Alcohol use: Not Currently  . Drug use: No    Family History  Problem Relation Age of Onset  . Heart Problems Mother   . Heart Problems Father     No Known  Allergies  OBJECTIVE: Blood pressure 118/72, pulse (!) 56, temperature 98.5 F (36.9 C), temperature source Oral, resp. rate 17, height 6' (1.829 m), weight 88.5 kg, SpO2 95 %.  Physical Exam Constitutional:      General: He is not in acute distress.    Appearance: He is well-developed.     Comments: Lying in bed with head of bed elevated; pleasant. Appears older than stated age.   Cardiovascular:     Rate and Rhythm: Normal rate and regular rhythm.     Heart sounds: Normal heart sounds. No murmur.  Pulmonary:     Effort: Pulmonary effort is normal.     Breath sounds: Normal breath sounds.  Musculoskeletal:     Comments: Left second toe with dry gangrenous appearance. Previous right great toe amputation. Distal pulses present.   Skin:    General: Skin is warm and dry.  Neurological:     Mental Status: He is alert and oriented to person, place, and time.  Psychiatric:        Behavior: Behavior normal.        Thought Content: Thought content normal.        Judgment: Judgment normal.     Lab Results Lab Results  Component Value Date   WBC 8.9 05/29/2019   HGB 10.0 (L) 05/29/2019   HCT 30.1 (L) 05/29/2019   MCV 90.4 05/29/2019   PLT 447 (H) 05/29/2019    Lab Results  Component Value Date   CREATININE 0.87 05/29/2019   BUN 9 05/29/2019   NA 138 05/29/2019   K 3.9 05/29/2019   CL 105 05/29/2019   CO2 26 05/29/2019    Lab Results  Component Value Date   ALT 18 05/27/2019   AST 20 05/27/2019   ALKPHOS 79 05/27/2019   BILITOT 0.3 05/27/2019     Microbiology: Recent Results (from the past 240 hour(s))  Blood Culture (routine x 2)     Status: None (Preliminary result)   Collection Time: 05/27/19  9:31 PM   Specimen: BLOOD  Result Value Ref Range Status   Specimen Description BLOOD LEFT ANTECUBITAL  Final   Special Requests   Final    BOTTLES DRAWN AEROBIC AND ANAEROBIC Blood Culture adequate volume   Culture   Final    NO GROWTH 1 DAY Performed at [facility], [address], [place], [place] [address]    Report Status PENDING  Incomplete  Blood Culture (routine x 2)     Status: Abnormal (Preliminary result)   Collection Time: 05/27/19  9:32 PM   Specimen: BLOOD  Result Value Ref Range Status   Specimen Description BLOOD RIGHT ANTECUBITAL  Final   Special Requests   Final    BOTTLES DRAWN AEROBIC AND ANAEROBIC Blood Culture adequate volume   Culture  Setup Time   Final    GRAM POSITIVE COCCI IN PAIRS AND CHAINS ANAEROBIC BOTTLE ONLY CRITICAL RESULT CALLED TO, READ BACK BY AND VERIFIED WITH: Arnaldo Natal [facility] 05/28/19 1919 JDW Performed at [facility], [address] [place], [place] [address]  Culture ENTEROCOCCUS CASSELIFLAVUS (A)  Final   Report Status PENDING  Incomplete  Blood Culture ID Panel (Reflexed)     Status: Abnormal   Collection Time: 05/27/19  9:32 PM  Result Value Ref Range Status   Enterococcus species DETECTED (A) NOT DETECTED Final    Comment: CRITICAL RESULT CALLED TO, READ BACK BY AND VERIFIED WITH: J CARNEY PHARMD 05/28/19 1919 JDW    Vancomycin resistance NOT DETECTED NOT DETECTED Final   Listeria monocytogenes NOT DETECTED NOT DETECTED Final   Staphylococcus species DETECTED (A) NOT DETECTED Final    Comment: Methicillin (oxacillin) resistant coagulase negative staphylococcus. Possible blood culture contaminant (unless isolated from more than one blood culture draw or clinical case suggests pathogenicity). No antibiotic treatment is indicated for blood  culture contaminants. CRITICAL RESULT CALLED TO, READ BACK BY AND VERIFIED WITH: J CARNEY PHARMD 05/28/19 1919 JDW    Staphylococcus aureus (BCID) NOT DETECTED NOT DETECTED Final   Methicillin resistance DETECTED (A) NOT DETECTED Final    Comment: CRITICAL RESULT CALLED TO, READ BACK BY AND VERIFIED WITH: J CARNEY PHARMD 05/28/19 1919 JDW    Streptococcus species NOT DETECTED NOT DETECTED Final   Streptococcus agalactiae NOT DETECTED NOT  DETECTED Final   Streptococcus pneumoniae NOT DETECTED NOT DETECTED Final   Streptococcus pyogenes NOT DETECTED NOT DETECTED Final   Acinetobacter baumannii NOT DETECTED NOT DETECTED Final   Enterobacteriaceae species NOT DETECTED NOT DETECTED Final   Enterobacter cloacae complex NOT DETECTED NOT DETECTED Final   Escherichia coli NOT DETECTED NOT DETECTED Final   Klebsiella oxytoca NOT DETECTED NOT DETECTED Final   Klebsiella pneumoniae NOT DETECTED NOT DETECTED Final   Proteus species NOT DETECTED NOT DETECTED Final   Serratia marcescens NOT DETECTED NOT DETECTED Final   Haemophilus influenzae NOT DETECTED NOT DETECTED Final   Neisseria meningitidis NOT DETECTED NOT DETECTED Final   Pseudomonas aeruginosa NOT DETECTED NOT DETECTED Final   Candida albicans NOT DETECTED NOT DETECTED Final   Candida glabrata NOT DETECTED NOT DETECTED Final   Candida krusei NOT DETECTED NOT DETECTED Final   Candida parapsilosis NOT DETECTED NOT DETECTED Final   Candida tropicalis NOT DETECTED NOT DETECTED Final    Comment: Performed at [facility], [address], [place], [place] [address]  SARS CORONAVIRUS 2 (TAT 6-24 HRS) Nasopharyngeal Nasopharyngeal Swab     Status: None   Collection Time: 05/27/19 10:00 PM   Specimen: Nasopharyngeal Swab  Result Value Ref Range Status   SARS Coronavirus 2 NEGATIVE NEGATIVE Final    Comment: (NOTE) SARS-CoV-2 target nucleic acids are NOT DETECTED. The SARS-CoV-2 RNA is generally detectable in upper and lower respiratory specimens during the acute phase of infection. Negative results do not preclude SARS-CoV-2 infection, do not rule out co-infections with other pathogens, and should not be used as the sole basis for treatment or other patient management decisions. Negative results must be combined with clinical observations, patient history, and epidemiological information. The expected result is Negative. Fact Sheet for Patients:  HairSlick.no Fact Sheet for Healthcare Providers: quierodirigir.com This test is not yet approved or cleared by the [place] FDA and  has been authorized for detection and/or diagnosis of SARS-CoV-2 by FDA under an Emergency Use Authorization (EUA). This EUA will remain  in effect (meaning this test can be used) for the duration of the COVID-19 declaration under Section 56 4(b)(1) of the Act, 21 U.S.C. section 360bbb-3(b)(1), unless the authorization is terminated or revoked sooner. Performed at [facility],  [address] [place], [place] [id number]      Marcos EkeGreg Devrin [place], [facility] [phone] Pager  05/29/2019  11:34 AM

## 2019-05-29 NOTE — Plan of Care (Signed)
Problem: Education: Goal: Knowledge of General Education information will improve Description Including pain rating scale, medication(s)/side effects and non-pharmacologic comfort measures Outcome: Progressing   Problem: Health Behavior/Discharge Planning: Goal: Ability to manage health-related needs will improve Outcome: Progressing   Problem: Clinical Measurements: Goal: Respiratory complications will improve Outcome: Progressing   Problem: Activity: Goal: Risk for activity intolerance will decrease Outcome: Progressing   Problem: Nutrition: Goal: Adequate nutrition will be maintained Outcome: Progressing   Problem: Coping: Goal: Level of anxiety will decrease Outcome: Progressing   Problem: Pain Managment: Goal: General experience of comfort will improve Outcome: Progressing   Problem: Safety: Goal: Ability to remain free from injury will improve Outcome: Progressing   

## 2019-05-29 NOTE — Progress Notes (Signed)
2D Echocardiogram has been completed. 

## 2019-05-30 ENCOUNTER — Encounter (HOSPITAL_COMMUNITY): Payer: Self-pay

## 2019-05-30 ENCOUNTER — Inpatient Hospital Stay (HOSPITAL_COMMUNITY): Payer: Medicare Other | Admitting: Anesthesiology

## 2019-05-30 ENCOUNTER — Encounter (HOSPITAL_COMMUNITY)
Admission: EM | Disposition: A | Discharge status: Skilled Nursing Facility | Payer: Self-pay | Source: Home / Self Care | Attending: Internal Medicine

## 2019-05-30 DIAGNOSIS — L97529 Non-pressure chronic ulcer of other part of left foot with unspecified severity: Secondary | ICD-10-CM

## 2019-05-30 DIAGNOSIS — Z89422 Acquired absence of other left toe(s): Secondary | ICD-10-CM

## 2019-05-30 DIAGNOSIS — I96 Gangrene, not elsewhere classified: Secondary | ICD-10-CM

## 2019-05-30 HISTORY — PX: AMPUTATION: SHX166

## 2019-05-30 LAB — CBC
HCT: 31.6 % — ABNORMAL LOW (ref 39.0–52.0)
Hemoglobin: 10.4 g/dL — ABNORMAL LOW (ref 13.0–17.0)
MCH: 29.7 pg (ref 26.0–34.0)
MCHC: 32.9 g/dL (ref 30.0–36.0)
MCV: 90.3 fL (ref 80.0–100.0)
Platelets: 478 10*3/uL — ABNORMAL HIGH (ref 150–400)
RBC: 3.5 MIL/uL — ABNORMAL LOW (ref 4.22–5.81)
RDW: 12.8 % (ref 11.5–15.5)
WBC: 9.4 10*3/uL (ref 4.0–10.5)
nRBC: 0 % (ref 0.0–0.2)

## 2019-05-30 LAB — BASIC METABOLIC PANEL
Anion gap: 5 (ref 5–15)
BUN: 7 mg/dL — ABNORMAL LOW (ref 8–23)
CO2: 27 mmol/L (ref 22–32)
Calcium: 8.1 mg/dL — ABNORMAL LOW (ref 8.9–10.3)
Chloride: 106 mmol/L (ref 98–111)
Creatinine, Ser: 0.78 mg/dL (ref 0.61–1.24)
GFR calc Af Amer: >60 mL/min (ref >60)
GFR calc non Af Amer: >60 mL/min (ref >60)
Glucose, Bld: 180 mg/dL — ABNORMAL HIGH (ref 70–99)
Potassium: 3.8 mmol/L (ref 3.5–5.1)
Sodium: 138 mmol/L (ref 135–145)

## 2019-05-30 LAB — GLUCOSE, CAPILLARY
Glucose-Capillary: 173 mg/dL — ABNORMAL HIGH (ref 70–99)
Glucose-Capillary: 152 mg/dL — ABNORMAL HIGH (ref 70–99)
Glucose-Capillary: 137 mg/dL — ABNORMAL HIGH (ref 70–99)
Glucose-Capillary: 237 mg/dL — ABNORMAL HIGH (ref 70–99)
Glucose-Capillary: 290 mg/dL — ABNORMAL HIGH (ref 70–99)

## 2019-05-30 SURGERY — AMPUTATION, FOOT, RAY
Anesthesia: General | Site: Toe | Laterality: Left

## 2019-05-30 MED ORDER — 0.9 % SODIUM CHLORIDE (POUR BTL) OPTIME
TOPICAL | Status: DC | PRN
  Administered 2019-05-30: 1000 mL

## 2019-05-30 MED ORDER — METHOCARBAMOL 1000 MG/10ML IJ SOLN
500.0000 mg | Freq: Four times a day (QID) | INTRAVENOUS | Status: DC | PRN
  Filled 2019-05-30: qty 5

## 2019-05-30 MED ORDER — DOCUSATE SODIUM 100 MG PO CAPS
100.0000 mg | ORAL_CAPSULE | Freq: Two times a day (BID) | ORAL | Status: DC
  Administered 2019-05-30 – 2019-06-03 (×9): 100 mg via ORAL
  Filled 2019-05-30 (×9): qty 1

## 2019-05-30 MED ORDER — OXYCODONE HCL 5 MG/5ML PO SOLN: 5.0000 mg | Freq: Once | ORAL | Status: DC | PRN

## 2019-05-30 MED ORDER — HYDROMORPHONE HCL 1 MG/ML IJ SOLN: 0.5000 mg | INTRAMUSCULAR | Status: DC | PRN

## 2019-05-30 MED ORDER — FENTANYL CITRATE (PF) 250 MCG/5ML IJ SOLN
INTRAMUSCULAR | Status: AC
  Filled 2019-05-30: qty 5

## 2019-05-30 MED ORDER — LIDOCAINE 2% (20 MG/ML) 5 ML SYRINGE
INTRAMUSCULAR | Status: AC
  Filled 2019-05-30: qty 5

## 2019-05-30 MED ORDER — ONDANSETRON HCL 4 MG PO TABS: 4.0000 mg | ORAL_TABLET | Freq: Four times a day (QID) | ORAL | Status: DC | PRN

## 2019-05-30 MED ORDER — MAGNESIUM CITRATE PO SOLN: 1.0000 | Freq: Once | ORAL | Status: DC | PRN

## 2019-05-30 MED ORDER — BISACODYL 10 MG RE SUPP: 10.0000 mg | Freq: Every day | RECTAL | Status: DC | PRN

## 2019-05-30 MED ORDER — OXYCODONE HCL 5 MG PO TABS: 10.0000 mg | ORAL_TABLET | ORAL | Status: DC | PRN

## 2019-05-30 MED ORDER — ONDANSETRON HCL 4 MG/2ML IJ SOLN: 4.0000 mg | Freq: Four times a day (QID) | INTRAMUSCULAR | Status: DC | PRN

## 2019-05-30 MED ORDER — SODIUM CHLORIDE 0.9 % IV SOLN
INTRAVENOUS | Status: DC
  Administered 2019-05-30: 16:00:00 via INTRAVENOUS

## 2019-05-30 MED ORDER — OXYCODONE HCL 5 MG PO TABS: 5.0000 mg | ORAL_TABLET | Freq: Once | ORAL | Status: DC | PRN

## 2019-05-30 MED ORDER — ONDANSETRON HCL 4 MG/2ML IJ SOLN
INTRAMUSCULAR | Status: DC | PRN
  Administered 2019-05-30: 4 mg via INTRAVENOUS

## 2019-05-30 MED ORDER — POLYETHYLENE GLYCOL 3350 17 G PO PACK: 17.0000 g | PACK | Freq: Every day | ORAL | Status: DC | PRN

## 2019-05-30 MED ORDER — LACTATED RINGERS IV SOLN
INTRAVENOUS | Status: DC | PRN
  Administered 2019-05-30: 13:00:00 via INTRAVENOUS

## 2019-05-30 MED ORDER — LIDOCAINE 2% (20 MG/ML) 5 ML SYRINGE
INTRAMUSCULAR | Status: DC | PRN
  Administered 2019-05-30: 60 mg via INTRAVENOUS

## 2019-05-30 MED ORDER — FENTANYL CITRATE (PF) 100 MCG/2ML IJ SOLN
INTRAMUSCULAR | Status: AC
  Filled 2019-05-30: qty 2

## 2019-05-30 MED ORDER — ACETAMINOPHEN 325 MG PO TABS: 325.0000 mg | ORAL_TABLET | Freq: Four times a day (QID) | ORAL | Status: DC | PRN

## 2019-05-30 MED ORDER — METHOCARBAMOL 500 MG PO TABS: 500.0000 mg | ORAL_TABLET | Freq: Four times a day (QID) | ORAL | Status: DC | PRN

## 2019-05-30 MED ORDER — DEXAMETHASONE SODIUM PHOSPHATE 10 MG/ML IJ SOLN
INTRAMUSCULAR | Status: AC
  Filled 2019-05-30: qty 1

## 2019-05-30 MED ORDER — ONDANSETRON HCL 4 MG/2ML IJ SOLN
INTRAMUSCULAR | Status: AC
  Filled 2019-05-30: qty 2

## 2019-05-30 MED ORDER — PROPOFOL 10 MG/ML IV BOLUS
INTRAVENOUS | Status: AC
  Filled 2019-05-30: qty 20

## 2019-05-30 MED ORDER — ONDANSETRON HCL 4 MG/2ML IJ SOLN: 4.0000 mg | Freq: Once | INTRAMUSCULAR | Status: DC | PRN

## 2019-05-30 MED ORDER — PROPOFOL 10 MG/ML IV BOLUS
INTRAVENOUS | Status: DC | PRN
  Administered 2019-05-30: 160 mg via INTRAVENOUS

## 2019-05-30 MED ORDER — FENTANYL CITRATE (PF) 100 MCG/2ML IJ SOLN
25.0000 ug | INTRAMUSCULAR | Status: DC | PRN
  Administered 2019-05-30 (×2): 25 ug via INTRAVENOUS

## 2019-05-30 MED ORDER — METOCLOPRAMIDE HCL 5 MG PO TABS: 5.0000 mg | ORAL_TABLET | Freq: Three times a day (TID) | ORAL | Status: DC | PRN

## 2019-05-30 MED ORDER — DEXAMETHASONE SODIUM PHOSPHATE 10 MG/ML IJ SOLN
INTRAMUSCULAR | Status: DC | PRN
  Administered 2019-05-30: 5 mg via INTRAVENOUS

## 2019-05-30 MED ORDER — METOCLOPRAMIDE HCL 5 MG/ML IJ SOLN: 5.0000 mg | Freq: Three times a day (TID) | INTRAMUSCULAR | Status: DC | PRN

## 2019-05-30 MED ORDER — ENOXAPARIN SODIUM 40 MG/0.4ML ~~LOC~~ SOLN
40.0000 mg | SUBCUTANEOUS | Status: DC
  Administered 2019-05-31 – 2019-06-03 (×4): 40 mg via SUBCUTANEOUS
  Filled 2019-05-30 (×4): qty 0.4

## 2019-05-30 MED ORDER — OXYCODONE HCL 5 MG PO TABS: 5.0000 mg | ORAL_TABLET | ORAL | Status: DC | PRN

## 2019-05-30 SURGICAL SUPPLY — 34 items
BLADE SAW SGTL MED 73X18.5 STR (BLADE) IMPLANT
BLADE SURG 21 STRL SS (BLADE) ×3 IMPLANT
BNDG COHESIVE 3X5 TAN STRL LF (GAUZE/BANDAGES/DRESSINGS) ×2 IMPLANT
BNDG COHESIVE 4X5 TAN STRL (GAUZE/BANDAGES/DRESSINGS) ×3 IMPLANT
BNDG ELASTIC 3X5.8 VLCR STR LF (GAUZE/BANDAGES/DRESSINGS) ×2 IMPLANT
BNDG GAUZE ELAST 4 BULKY (GAUZE/BANDAGES/DRESSINGS) ×3 IMPLANT
COVER SURGICAL LIGHT HANDLE (MISCELLANEOUS) ×6 IMPLANT
COVER WAND RF STERILE (DRAPES) ×3 IMPLANT
DRAPE U-SHAPE 47X51 STRL (DRAPES) ×6 IMPLANT
DRSG ADAPTIC 3X8 NADH LF (GAUZE/BANDAGES/DRESSINGS) ×3 IMPLANT
DRSG EMULSION OIL 3X3 NADH (GAUZE/BANDAGES/DRESSINGS) ×2 IMPLANT
DRSG PAD ABDOMINAL 8X10 ST (GAUZE/BANDAGES/DRESSINGS) ×4 IMPLANT
DURAPREP 26ML APPLICATOR (WOUND CARE) ×3 IMPLANT
ELECT REM PT RETURN 9FT ADLT (ELECTROSURGICAL) ×3
ELECTRODE REM PT RTRN 9FT ADLT (ELECTROSURGICAL) ×1 IMPLANT
GAUZE SPONGE 4X4 12PLY STRL (GAUZE/BANDAGES/DRESSINGS) ×3 IMPLANT
GLOVE BIOGEL PI IND STRL 9 (GLOVE) ×1 IMPLANT
GLOVE BIOGEL PI INDICATOR 9 (GLOVE) ×2
GLOVE SURG ORTHO 9.0 STRL STRW (GLOVE) ×3 IMPLANT
GOWN STRL REUS W/ TWL XL LVL3 (GOWN DISPOSABLE) ×2 IMPLANT
GOWN STRL REUS W/TWL XL LVL3 (GOWN DISPOSABLE) ×6
KIT BASIN OR (CUSTOM PROCEDURE TRAY) ×3 IMPLANT
KIT TURNOVER KIT B (KITS) ×3 IMPLANT
NS IRRIG 1000ML POUR BTL (IV SOLUTION) ×3 IMPLANT
PACK ORTHO EXTREMITY (CUSTOM PROCEDURE TRAY) ×3 IMPLANT
PAD ARMBOARD 7.5X6 YLW CONV (MISCELLANEOUS) ×6 IMPLANT
PAD CAST 4YDX4 CTTN HI CHSV (CAST SUPPLIES) IMPLANT
PADDING CAST COTTON 4X4 STRL (CAST SUPPLIES) ×3
STOCKINETTE IMPERVIOUS LG (DRAPES) IMPLANT
SUT ETHILON 2 0 PSLX (SUTURE) ×3 IMPLANT
TOWEL GREEN STERILE (TOWEL DISPOSABLE) ×3 IMPLANT
TUBE CONNECTING 12'X1/4 (SUCTIONS) ×1
TUBE CONNECTING 12X1/4 (SUCTIONS) ×2 IMPLANT
YANKAUER SUCT BULB TIP NO VENT (SUCTIONS) ×3 IMPLANT

## 2019-05-30 NOTE — Progress Notes (Signed)
   NAME:  Bryan Farrell, MRN:  [id number], DOB:  07-13-1951, LOS: 3 ADMISSION DATE:  05/27/2019,  Primary: Placey, Audrea Muscat, NP  CHIEF COMPLAINT:  Foot pain  Medical Service: Internal Medicine Teaching Service         Attending Physician: Dr. Velna Ochs, MD    First Contact: Dr. Darrick Meigs Pager: [phone]  Second Contact: Dr. Darrick Meigs Pager: [phone]       After Hours (After 5p/  First Contact Pager: [phone]  weekends / holidays): Second Contact Pager: [phone]    Brief History  68 yo male with relevant PMH of DM and remote hx of toe amputation on the right who is presenting per recommendation of staff at the homeless shelter at which he resides for a necrotic left 2nd toe.  Subjective  Seen at bedside this morning.  No complaints.  Significant Hospital Events   10/13 Admission 10/16 left 2nd toe amputation Objective   Blood pressure 113/71, pulse 62, temperature 98.5 F (36.9 C), temperature source Oral, resp. rate 18, height 6' (1.829 m), weight 88.5 kg, SpO2 94 %.     Intake/Output Summary (Last 24 hours) at 05/30/2019 0503 Last data filed at 05/29/2019 2359 Gross per 24 hour  Intake 340 ml  Output 1290 ml  Net -950 ml   Examination: General: chronically ill appearing Heart: RRR. Peripheral pulses intact Pulm: lung sounds clear Abd: soft nontender Psych: Depressed affect   Consults:  Ortho Care management Infectious disease Vascular surgery  Significant Diagnostic Tests:  10/13 left foot xray: findings consistent with osteomyelitis of proximal phalynx   10/14 LLE duplex with ABI: no evidence of stenosis in common femoral, superifical femoral or popliteal arteries. Posterior tibial and peroneal arteries patent with monophasic waveforms. Mid anterior tibial artery + for short segment of occlusion  10/15 Echo: EF 60-65%. elevated LVED pressure. No hypokinesis. No evidence of valvular vegitation  Micro Data:  10/13 Blood cultures>> enterococcus  10/15 blood cultures>>  Antimicrobials:  Zosyn 10/13>>10/14 vanc 10/13>>10/15 Flagyl 10/14>>10/15 Ceftriaxone 10/14>>10/15 Unasyn 10/15>> Labs    CBC Latest Ref Rng & Units 05/29/2019 05/28/2019 05/27/2019  WBC 4.0 - 10.5 K/uL 8.9 8.3 9.2  Hemoglobin 13.0 - 17.0 g/dL 10.0(L) 10.2(L) 11.7(L)  Hematocrit 39.0 - 52.0 % 30.1(L) 31.0(L) 35.6(L)  Platelets 150 - 400 K/uL 447(H) 487(H) 561(H)    Summary  68 yo male who presented with osteomyeltis of left 2nd toe and subsequently found to have enterococci bacteremia  Assessment & Plan:  Active Problems:   Osteomyelitis ([place])   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 Plan Continue Unasyn PT/OT Amputation today Will need adequate pain control following procedure  Enterococcus bacteremia.  Noted on blood cultures from 10/13.  Infectious disease consulted.  Remains afebrile.  Vital signs stable.  10/15 echo without evidence of vegetation. Repeat blood cultures from 10/15 pending. Plan Continue Unasyn  Poorly controlled DM II. A1C 9.0. continue SSI. CBGs.  Best practice:  CODE STATUS: full Diet: DM Pain management: tylenol DVT for prophylaxis: SCDs Social considerations/Family communication: We will likely need SNF placement at discharge.  Level 2 pending. Dispo: pending medical stabilization.   Mitzi Hansen, MD INTERNAL MEDICINE RESIDENT PGY-1 PAGER #: [phone] 05/30/19 5:03 AM

## 2019-05-30 NOTE — Anesthesia Preprocedure Evaluation (Addendum)
Anesthesia Evaluation  Patient identified by MRN, date of birth, ID band Patient awake    Reviewed: Allergy & Precautions, NPO status , Patient's Chart, lab work & pertinent test results  History of Anesthesia Complications Negative for: history of anesthetic complications  Airway Mallampati: II  TM Distance: >3 FB Neck ROM: Full    Dental  (+) Poor Dentition, Chipped, Missing   Pulmonary neg pulmonary ROS,    Pulmonary exam normal        Cardiovascular hypertension, Normal cardiovascular exam     Neuro/Psych PSYCHIATRIC DISORDERS Depression Dementia negative neurological ROS     GI/Hepatic negative GI ROS, Neg liver ROS,   Endo/Other  diabetes, Poorly Controlled  Renal/GU negative Renal ROS  negative genitourinary   Musculoskeletal negative musculoskeletal ROS (+)   Abdominal   Peds  Hematology negative hematology ROS (+)   Anesthesia Other Findings Enterococcus bacteremia  Unremarkable echo 05/29/19  Reproductive/Obstetrics                            Anesthesia Physical Anesthesia Plan  ASA: III  Anesthesia Plan: General   Post-op Pain Management:    Induction: Intravenous  PONV Risk Score and Plan: 2 and Ondansetron, Treatment may vary due to age or medical condition and Midazolam  Airway Management Planned: LMA  Additional Equipment: None  Intra-op Plan:   Post-operative Plan: Extubation in OR  Informed Consent: I have reviewed the patients History and Physical, chart, labs and discussed the procedure including the risks, benefits and alternatives for the proposed anesthesia with the patient or authorized representative who has indicated his/her understanding and acceptance.     Dental advisory given  Plan Discussed with:   Anesthesia Plan Comments:        Anesthesia Quick Evaluation

## 2019-05-30 NOTE — Anesthesia Postprocedure Evaluation (Signed)
Anesthesia Post Note  Patient: Bryan Farrell  Procedure(s) Performed: LEFT FOOT 2ND RAY AMPUTATION (Left Toe)     Patient location during evaluation: PACU Anesthesia Type: General Level of consciousness: awake and alert Pain management: pain level controlled Vital Signs Assessment: post-procedure vital signs reviewed and stable Respiratory status: spontaneous breathing, nonlabored ventilation and respiratory function stable Cardiovascular status: blood pressure returned to baseline and stable Postop Assessment: no apparent nausea or vomiting Anesthetic complications: no    Last Vitals:  Vitals:   05/30/19 1500 05/30/19 1516  BP:  (!) 153/86  Pulse:  (!) 58  Resp:    Temp: (!) 36.1 C   SpO2:  98%    Last Pain:  Vitals:   05/30/19 1430  TempSrc:   PainSc: Asleep                 Lidia Collum

## 2019-05-30 NOTE — Anesthesia Procedure Notes (Signed)
Procedure Name: LMA Insertion Date/Time: 05/30/2019 1:40 PM Performed by: Trinna Post., CRNA Pre-anesthesia Checklist: Patient identified, Emergency Drugs available, Suction available, Patient being monitored and Timeout performed Patient Re-evaluated:Patient Re-evaluated prior to induction Oxygen Delivery Method: Circle system utilized Preoxygenation: Pre-oxygenation with 100% oxygen Induction Type: IV induction Ventilation: Mask ventilation without difficulty LMA: LMA inserted Number of attempts: 1 Placement Confirmation: positive ETCO2 and breath sounds checked- equal and bilateral Tube secured with: Tape Dental Injury: Teeth and Oropharynx as per pre-operative assessment

## 2019-05-30 NOTE — Progress Notes (Signed)
Orthopedic Tech Progress Note Patient Details:  Bryan Farrell 11-14-1950 [id number]  Ortho Devices Type of Ortho Device: Postop shoe/boot Ortho Device/Splint Location: LLE Ortho Device/Splint Interventions: Adjustment, Application, Ordered   Post Interventions Patient Tolerated: Well Instructions Provided: Care of device, Adjustment of device   [name] 05/30/2019, 4:07 PM

## 2019-05-30 NOTE — Progress Notes (Signed)
    [facility] for Infectious Disease    Date of Admission:  05/27/2019   Total days of antibiotics 4        Day 4 amp/sub   ID: Bryan Farrell is a 68 y.o. 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 Remains afebrile. Underwent TTE yesterday which did not show any vegetation  Medications:  . docusate sodium  100 mg Oral BID  . [START ON 05/31/2019] enoxaparin (LOVENOX) injection  40 mg Subcutaneous Q24H  . fentaNYL      . insulin aspart  0-9 Units Subcutaneous TID WC    Objective: Vital signs in last 24 hours: Temp:  [97 F (36.1 C)-98.9 F (37.2 C)] 97 F (36.1 C) (10/16 1500) Pulse Rate:  [54-68] 58 (10/16 1516) Resp:  [11-22] 13 (10/16 1458) BP: (113-153)/(62-86) 153/86 (10/16 1516) SpO2:  [91 %-100 %] 98 % (10/16 1516) Weight:  [88.5 kg] 88.5 kg (10/16 0953) Physical Exam  Constitutional: He is oriented to person, place, and time. He appears well-developed and well-nourished. No distress.  HENT:  Mouth/Throat: Oropharynx is clear and moist. No oropharyngeal exudate.  Cardiovascular: Normal rate, regular rhythm and normal heart sounds. Exam reveals no gallop and no friction rub.  No murmur heard.  Pulmonary/Chest: Effort normal and breath sounds normal. No respiratory distress. He has no wheezes.  Abdominal: Soft. Bowel sounds are normal. He exhibits no distension. There is no tenderness.  Ext: left foot wrapped from surgery Psychiatric: He has a normal mood and affect. His behavior is normal.     Lab Results Recent Labs    05/29/19 0727 05/30/19 0831  WBC 8.9 9.4  HGB 10.0* 10.4*  HCT 30.1* 31.6*  NA 138 138  K 3.9 3.8  CL 105 106   CO2 26 27  BUN 9 7*  CREATININE 0.87 0.78    Microbiology: 10/13 blood cx 1 in 4 bottles with VRE-amp S 10/15 blood cx NGTD Studies/Results: No results found.   Assessment/Plan: Gangrenous toe with secondary, transient enterococcal bacteremia = continue with amp/sub while hospitalized. Can transition to oral linezolid 600mg  BID to finish out course of 14d to treat wound/bacteremia. - continue with wound care by dr duda  Will sign off  [facility] for Infectious Diseases Cell: [phone] Pager: [phone]  05/30/2019, 4:42 PM

## 2019-05-30 NOTE — Op Note (Signed)
05/30/2019  2:26 PM  PATIENT:  Bryan Farrell    PRE-OPERATIVE DIAGNOSIS:  Osteomyelitis/Gangrene Left Foot  POST-OPERATIVE DIAGNOSIS:  Same  PROCEDURE:  LEFT FOOT 2ND RAY AMPUTATION  SURGEON:  [name], MD  PHYSICIAN ASSISTANT:None ANESTHESIA:   General  PREOPERATIVE INDICATIONS:  [name] is a  68 y.o. male with a diagnosis of Osteomyelitis/Gangrene Left Foot who failed conservative measures and elected for surgical management.    The risks benefits and alternatives were discussed with the patient preoperatively including but not limited to the risks of infection, bleeding, nerve injury, cardiopulmonary complications, the need for revision surgery, among others, and the patient was willing to proceed.  OPERATIVE IMPLANTS: None  @ENCIMAGES @  OPERATIVE FINDINGS: Good petechial bleeding on the wound edges no abscess at the surgical margin.  OPERATIVE PROCEDURE: Patient was brought the operating room and underwent a general anesthetic.  After adequate levels anesthesia were obtained patient's left lower extremity was prepped using DuraPrep draped into a sterile field a timeout was called.  AV incision was made around the necrotic second toe this was carried down to the base of the second metatarsal.  The second metatarsal was resected through the base.  There was good petechial bleeding electrocautery was used for hemostasis.  The wound was irrigated with normal saline.  The incision was closed using 2-0 nylon and a sterile dressing was applied patient was taken to the PACU in stable condition.   DISCHARGE PLANNING:  Antibiotic duration: 24-hour antibiotics postoperatively  Weightbearing: Touchdown weightbearing on the left  Pain medication: Opioid pathway ordered  Dressing care/ Wound VAC: Dressing change in 1 week  Ambulatory devices: Walker  Discharge to: Patient will not need skilled nursing for discharge  Follow-up: In the office 1 week post  operative.

## 2019-05-30 NOTE — Transfer of Care (Signed)
Immediate Anesthesia Transfer of Care Note  Patient: Bryan Farrell  Procedure(s) Performed: LEFT FOOT 2ND RAY AMPUTATION (Left Toe)  Patient Location: PACU  Anesthesia Type:General  Level of Consciousness: awake, alert  and oriented  Airway & Oxygen Therapy: Patient Spontanous Breathing  Post-op Assessment: Report given to RN and Post -op Vital signs reviewed and stable  Post vital signs: Reviewed and stable  Last Vitals:  Vitals Value Taken Time  BP 126/79 05/30/19 1404  Temp    Pulse 56 05/30/19 1406  Resp 15 05/30/19 1406  SpO2 97 % 05/30/19 1406  Vitals shown include unvalidated device data.  Last Pain:  Vitals:   05/30/19 0953  TempSrc:   PainSc: 0-No pain         Complications: No apparent anesthesia complications

## 2019-05-30 NOTE — Plan of Care (Signed)
  Problem: Education: Goal: Knowledge of General Education information will improve Description: Including pain rating scale, medication(s)/side effects and non-pharmacologic comfort measures Outcome: Progressing   Problem: Clinical Measurements: Goal: Will remain free from infection Outcome: Progressing   Problem: Nutrition: Goal: Adequate nutrition will be maintained Outcome: Progressing   Problem: Coping: Goal: Level of anxiety will decrease Outcome: Progressing   Problem: Safety: Goal: Ability to remain free from injury will improve Outcome: Progressing   Problem: Skin Integrity: Goal: Risk for impaired skin integrity will decrease Outcome: Progressing   Pt CHG bath, CBG, et consent form completed prior to surgery. NPO, had pre-surgery drink by 0400. Transferred to short-stay for surgery. 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  Jenene Slicker, RN 05/30/2019 1055

## 2019-05-30 NOTE — NC FL2 (Signed)
[place] LEVEL OF CARE SCREENING TOOL     IDENTIFICATION  Patient Name: Bryan Farrell Birthdate: 01-Mar-1951 Sex: male Admission Date (Current Location): 05/27/2019  [place] and [place] Number:  Kathleen Argue [phone] [place] and Address:  [facility] [place] [address] [place], [place] [address]      Provider Number: [id number]  Attending Physician Name and Address:  Velna Ochs, MD  Relative Name and Phone Number:  Reinhard Schack [phone]    Current Level of Care: Hospital Recommended Level of Care: [place] Prior Approval Number:    Date Approved/Denied:   PASRR Number: [id number]  Discharge Plan: SNF    Current Diagnoses: Patient Active Problem List   Diagnosis Date Noted  . Bacteremia due to Enterococcus 05/29/2019  . Toe osteomyelitis, left ([place])   . 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 Mild protein-calorie malnutrition ([place])   . Osteomyelitis ([place]) 05/27/2019  . Moderate recurrent major depression ([place])   . Adjustment disorder with depressed mood 04/13/2017  . Vitamin B12 deficiency 03/25/2017  . Vascular dementia ([place]) 03/23/2017  . Suicidal ideation 03/23/2017  . Homelessness 03/23/2017  . Diabetes ([place]) 03/23/2017    Orientation RESPIRATION BLADDER Height & Weight     Self, Time, Situation, Place  Normal Continent Weight: 88.5 kg Height:  6' (182.9 cm)  BEHAVIORAL SYMPTOMS/MOOD NEUROLOGICAL BOWEL NUTRITION STATUS  Other (Comment)(N/A) (N/A) Incontinent Diet(carb modified)  AMBULATORY STATUS COMMUNICATION OF NEEDS Skin   Limited Assist Verbally Other (Comment)(dry gangrene of the L second toe)                       Personal Care Assistance Level of Assistance  Bathing, Feeding, Dressing Bathing Assistance: Limited assistance Feeding assistance: Limited assistance Dressing Assistance: Limited assistance     Functional Limitations Info  Sight,  Hearing, Speech Sight Info: Adequate Hearing Info: Adequate Speech Info: Adequate    SPECIAL CARE FACTORS FREQUENCY  PT (By licensed PT), OT (By licensed OT)     PT Frequency: Min 3X/week OT Frequency: Min 3X/week            Contractures Contractures Info: Not present    Additional Factors Info  Code Status, Allergies Code Status Info: Full Code Allergies Info: NKDA           Current Medications (05/30/2019):  This is the current hospital active medication list Current Facility-Administered Medications  Medication Dose Route Frequency Provider Last Rate Last Dose  . acetaminophen (TYLENOL) tablet 650 mg  650 mg Oral Q6H PRN Asencion Noble, MD       Or  . acetaminophen (TYLENOL) suppository 650 mg  650 mg Rectal Q6H PRN Asencion Noble, MD      . Ampicillin-Sulbactam (UNASYN) 3 g in sodium chloride 0.9 % 100 mL IVPB  3 g Intravenous Q6H Tommy Medal, Lavell Islam, MD 200 mL/hr at 05/30/19 0829 3 g at 05/30/19 0829  . ceFAZolin (ANCEF) IVPB 2g/100 mL premix  2 g Intravenous On Call to OR Dondra Prader R, NP      . insulin aspart (novoLOG) injection 0-9 Units  0-9 Units Subcutaneous TID WC Harvie Heck, MD   2 Units at 05/30/19 1941     Discharge Medications: Please see discharge summary for a list of discharge medications.  Relevant Imaging Results:  Relevant Lab Results:   Additional Information SSN: [id number]  [place], BSN, NCM-BC, ACM-RN [phone]

## 2019-05-30 NOTE — Interval H&P Note (Signed)
History and Physical Interval Note:  05/30/2019 6:56 AM  Bryan Farrell  has presented today for surgery, with the diagnosis of Osteomyelitis/Gangrene Left Foot.  The various methods of treatment have been discussed with the patient and family. After consideration of risks, benefits and other options for treatment, the patient has consented to  Procedure(s): LEFT FOOT 2ND RAY AMPUTATION (Left) as a surgical intervention.  The patient's history has been reviewed, patient examined, no change in status, stable for surgery.  I have reviewed the patient's chart and labs.  Questions were answered to the patient's satisfaction.     [name]

## 2019-05-30 NOTE — Progress Notes (Signed)
Physical Therapy Treatment Patient Details Name: Bryan Farrell MRN: [id number] DOB: 09-01-1950 Today's Date: 05/30/2019    History of Present Illness Pt is a 68 y/o male admitted secondary to dry gangrene of the L second toe. Ortho and Vascular both consulted, work-up in progress. Planning for amputation of L second toe on 05/30/19. PMH including but not limited to DM and HTN.    PT Comments    Pt very limited this session secondary to frustration and agitation with his current situation. He became very upset with therapist and only willing to ambulate to and from bathroom. Plan is for pt to go to OR today for toe amputation. PT will continue to follow pt acutely to progress mobility as tolerated.     Follow Up Recommendations  SNF     Equipment Recommendations  Rolling walker with 5" wheels    Recommendations for Other Services       Precautions / Restrictions Precautions Precautions: Fall Restrictions Weight Bearing Restrictions: No    Mobility  Bed Mobility Overal bed mobility: Modified Independent                Transfers Overall transfer level: Needs assistance Equipment used: Rolling walker (2 wheeled) Transfers: Sit to/from Stand Sit to Stand: Supervision         General transfer comment: supervision for safety, good technique utilized  Ambulation/Gait Ambulation/Gait assistance: Min guard Gait Distance (Feet): 20 Feet Assistive device: Rolling walker (2 wheeled) Gait Pattern/deviations: Step-through pattern;Decreased stride length Gait velocity: decreased   General Gait Details: pt overall steady with use of RW ambulating to and from bathroom; no overt LOB or need for physical assistance, min guard for safety   Stairs             Wheelchair Mobility    Modified Rankin (Stroke Patients Only)       Balance Overall balance assessment: Needs assistance Sitting-balance support: Feet supported Sitting balance-Leahy Scale: Good      Standing balance support: Single extremity supported Standing balance-Leahy Scale: Poor                              Cognition Arousal/Alertness: Awake/alert Behavior During Therapy: Agitated Overall Cognitive Status: No family/caregiver present to determine baseline cognitive functioning Area of Impairment: Memory;Orientation;Safety/judgement;Problem solving                 Orientation Level: Disoriented to;Situation   Memory: Decreased short-term memory   Safety/Judgement: Decreased awareness of safety;Decreased awareness of deficits   Problem Solving: Requires verbal cues General Comments: pt very frustrated throughout, "tired of all of this!"      Exercises      General Comments        Pertinent Vitals/Pain Pain Assessment: Faces Faces Pain Scale: Hurts even more Pain Location: L foot Pain Descriptors / Indicators: Grimacing;Guarding Pain Intervention(s): Monitored during session;Repositioned    Home Living                      Prior Function            PT Goals (current goals can now be found in the care plan section) Acute Rehab PT Goals PT Goal Formulation: With patient Time For Goal Achievement: 06/11/19 Potential to Achieve Goals: Good Progress towards PT goals: Progressing toward goals    Frequency    Min 3X/week      PT Plan Current plan remains appropriate  Co-evaluation              AM-PAC PT "6 Clicks" Mobility   Outcome Measure  Help needed turning from your back to your side while in a flat bed without using bedrails?: None Help needed moving from lying on your back to sitting on the side of a flat bed without using bedrails?: None Help needed moving to and from a bed to a chair (including a wheelchair)?: A Little Help needed standing up from a chair using your arms (e.g., wheelchair or bedside chair)?: A Little Help needed to walk in hospital room?: A Little Help needed climbing 3-5 steps with  a railing? : A Lot 6 Click Score: 19    End of Session   Activity Tolerance: Patient limited by pain;Treatment limited secondary to agitation Patient left: in bed;with call bell/phone within reach;with bed alarm set Nurse Communication: Mobility status PT Visit Diagnosis: Other abnormalities of gait and mobility (R26.89)     Time: 5456-2563 PT Time Calculation (min) (ACUTE ONLY): 10 min  Charges:  $Gait Training: 8-22 mins                     Sherie Don, [place], DPT  Acute Rehabilitation Services Pager [phone] Office [place] 05/30/2019, 11:35 AM

## 2019-05-31 ENCOUNTER — Encounter (HOSPITAL_COMMUNITY): Payer: Self-pay | Admitting: Orthopedic Surgery

## 2019-05-31 LAB — GLUCOSE, CAPILLARY
Glucose-Capillary: 265 mg/dL — ABNORMAL HIGH (ref 70–99)
Glucose-Capillary: 275 mg/dL — ABNORMAL HIGH (ref 70–99)
Glucose-Capillary: 225 mg/dL — ABNORMAL HIGH (ref 70–99)
Glucose-Capillary: 195 mg/dL — ABNORMAL HIGH (ref 70–99)

## 2019-05-31 IMAGING — CT CT HEAD W/O CM
4 series · 16 of 47 positions shown, 18 images · non-contrast
Comparison: None.

CLINICAL DATA: Recent fall, confusion, memory loss. Possible
dementia. History of diabetes.

EXAM:
CT HEAD WITHOUT CONTRAST
TECHNIQUE: Contiguous axial images were obtained from the base of the skull
through the vertex without intravenous contrast.

[Series 2: head bone · axial · 0.44mm/px · z∈[+677,+707]mm · 3 of 77 slices shown]
[im 8/77  bone]
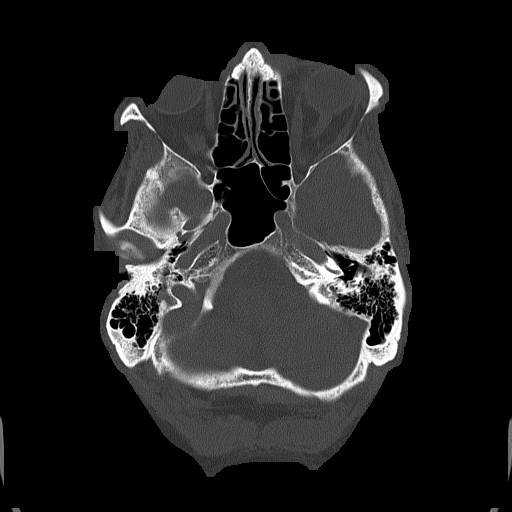
[im 16/77  bone]
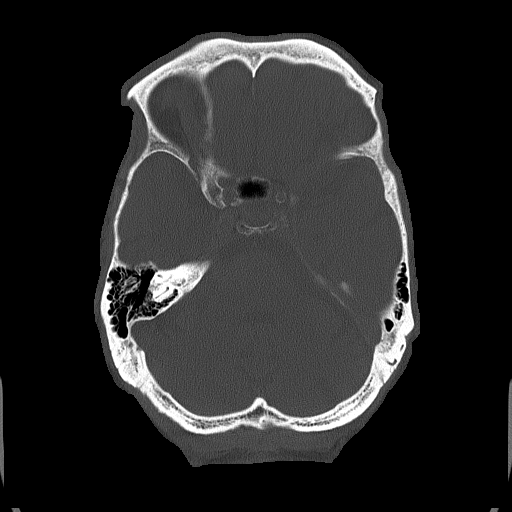
[im 23/77  bone]
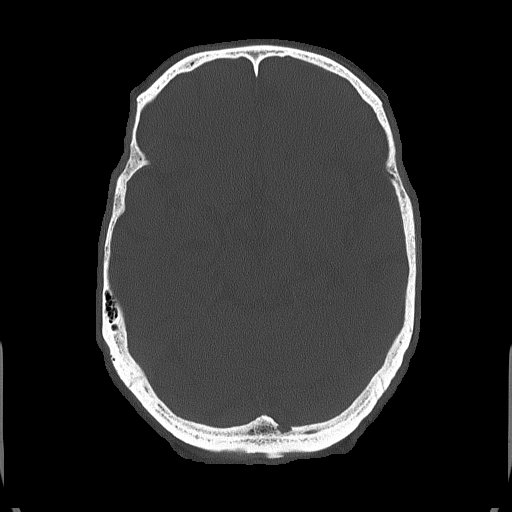

[Series 3: head wo · axial · 0.44mm/px · z∈[+678,+793]mm · 7 of 31 slices shown, 9 images]
[im 4/31  brain]
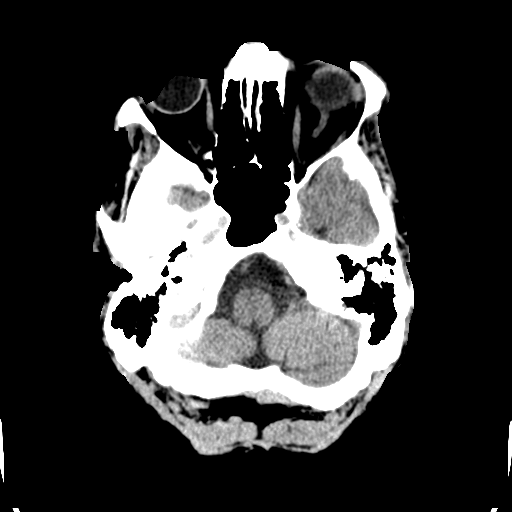
[im 4/31  bone]
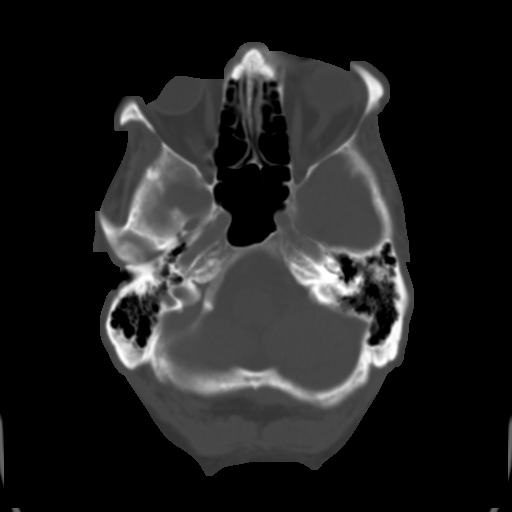
[im 8/31  brain]
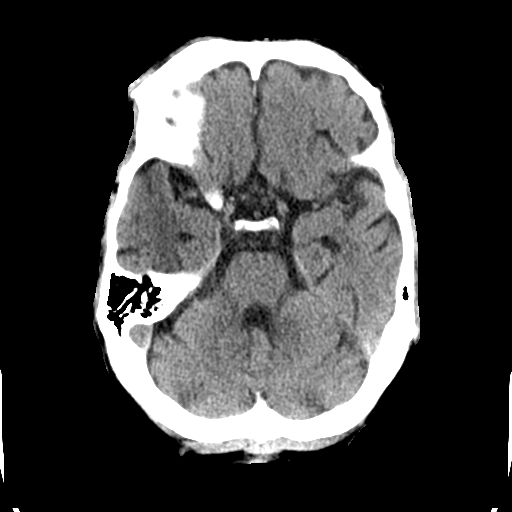
[im 12/31  brain]
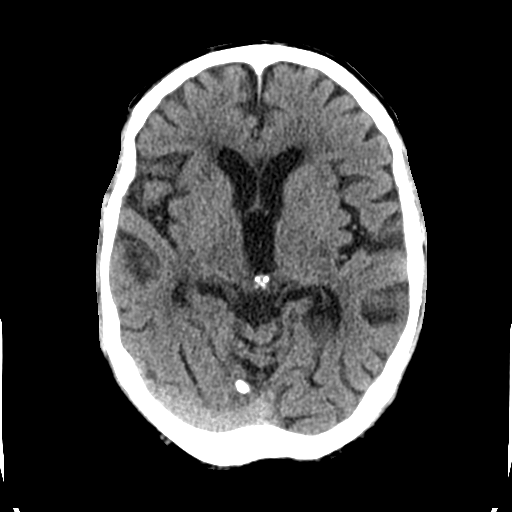
[im 16/31  brain]
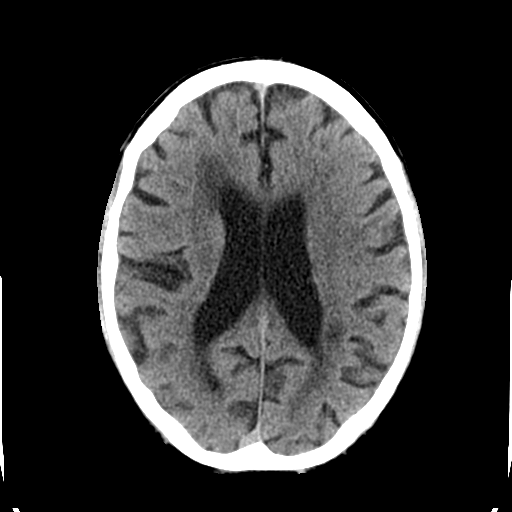
[im 19/31  brain]
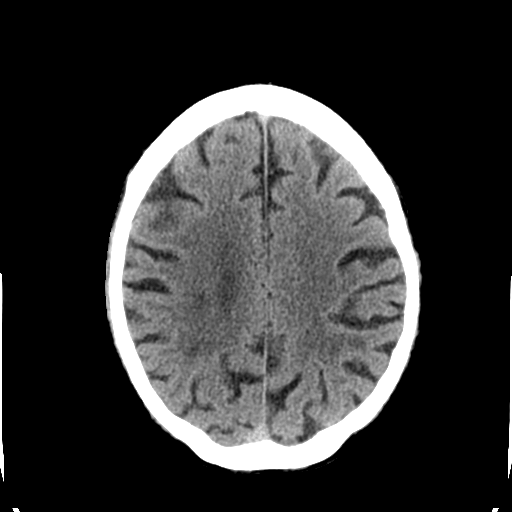
[im 19/31  bone]
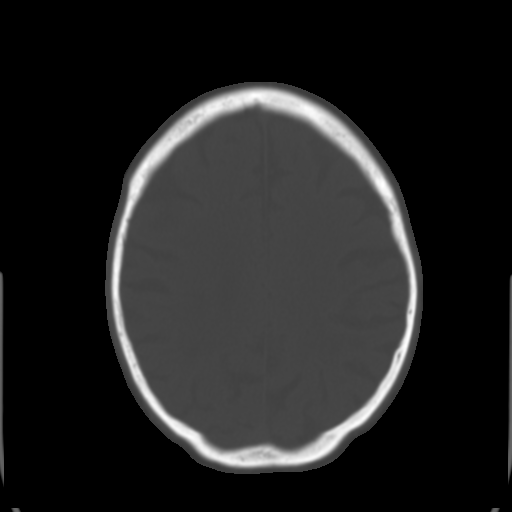
[im 23/31  brain]
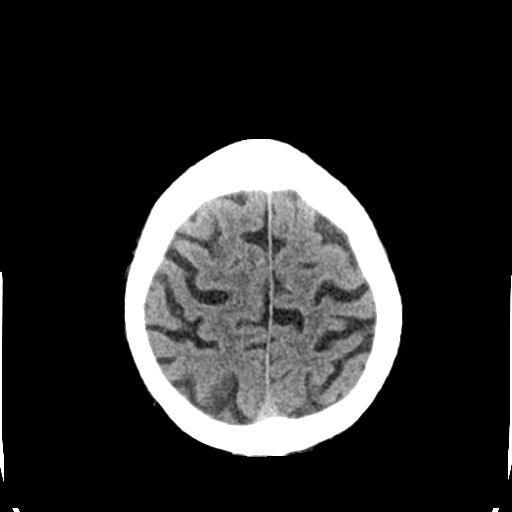
[im 27/31  brain]
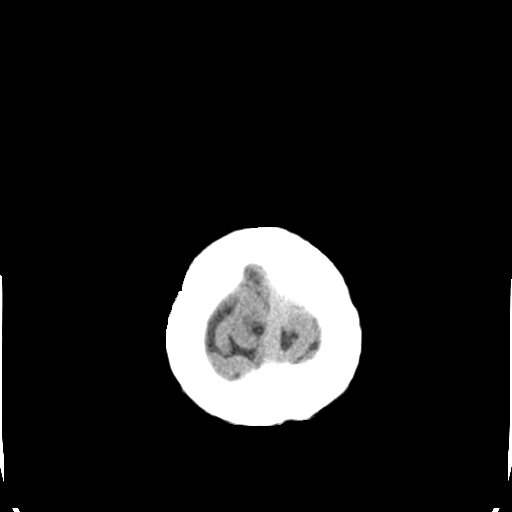

[Series 4: coronal soft tissue · coronal · 0.30mm/px · 3 of 63 slices shown]
[im 21/63  brain]
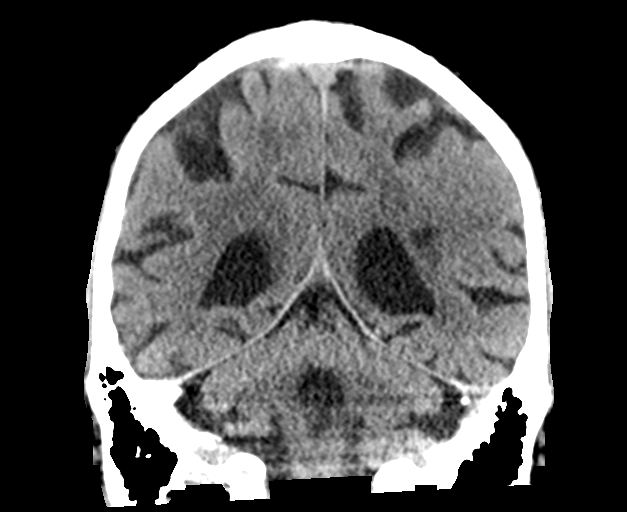
[im 28/63  brain]
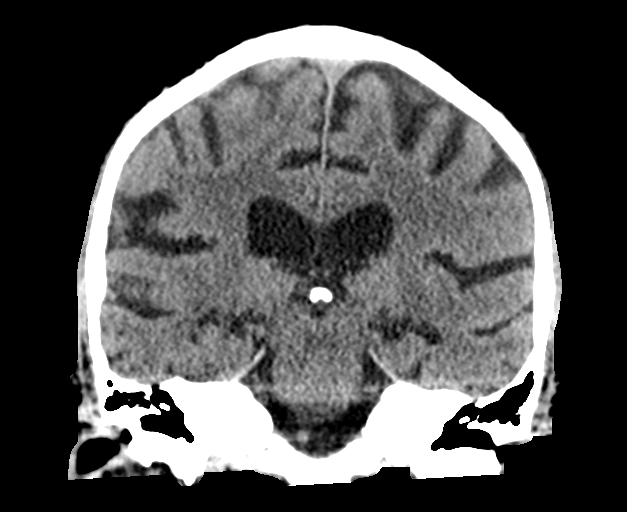
[im 35/63  brain]
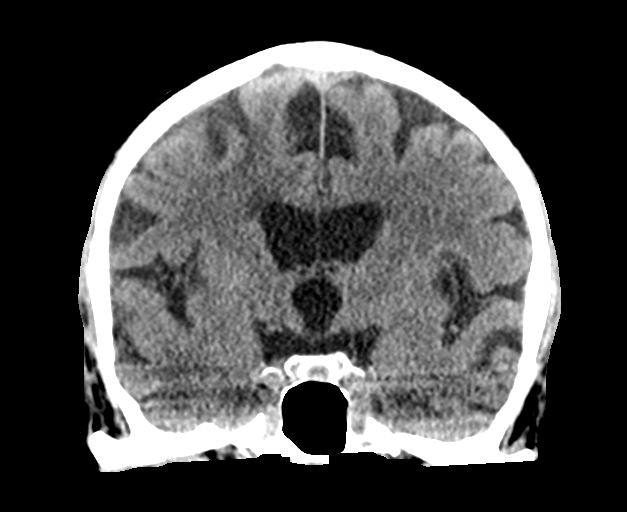

[Series 5: sagittal soft tissue · sagittal · 0.30mm/px · 3 of 51 slices shown]
[im 17/51  brain]
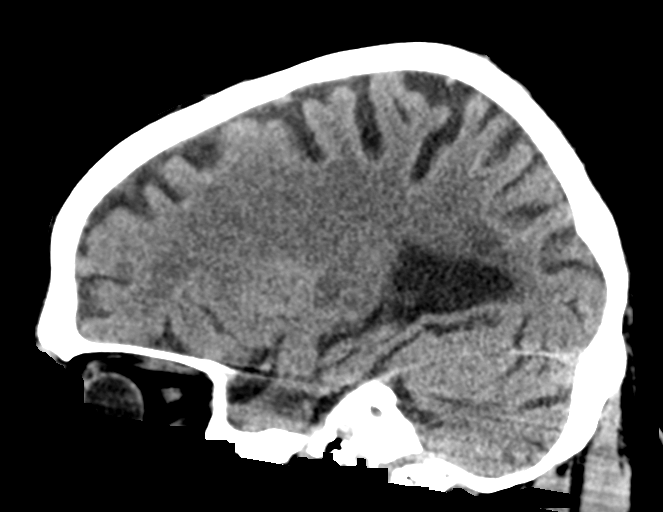
[im 26/51  brain]
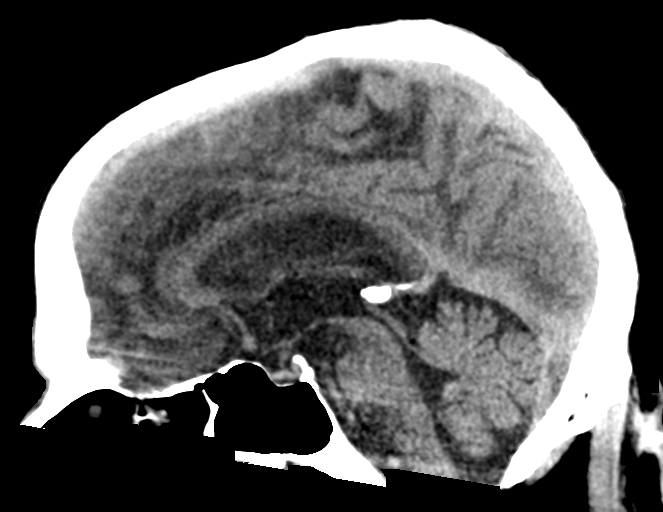
[im 34/51  brain]
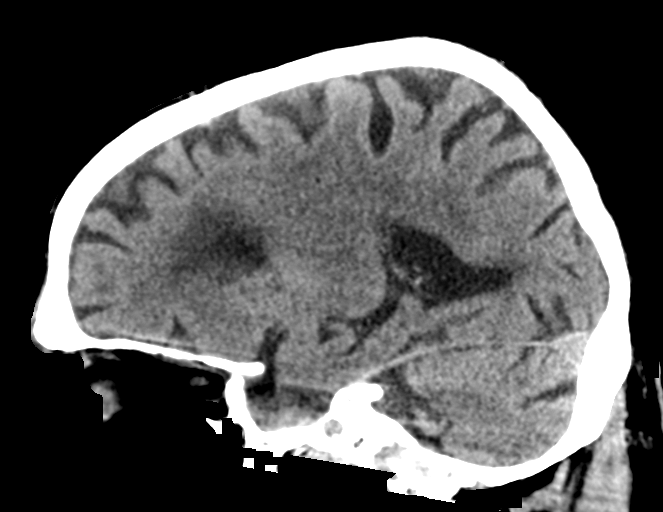

[16 of 47 positions shown; findings below may reference images not displayed]

FINDINGS: BRAIN: No intraparenchymal hemorrhage, mass effect nor midline
shift. Moderate ventriculomegaly, on the basis of global parenchymal
brain volume loss as there is overall commensurate enlargement of
cerebral sulci and cerebellar folia. Ex vacuo dilatation RIGHT
lateral ventricle due to RIGHT basal ganglia and RIGHT thalamus old
infarcts. No acute large vascular territory infarct. Patchy
supratentorial white matter hypodensities. No abnormal extra-axial
fluid collections. Basal cisterns are patent.

VASCULAR: Mild calcific atherosclerosis of the carotid siphons.

SKULL: No skull fracture. No significant scalp soft tissue swelling.

SINUSES/ORBITS: The mastoid air-cells and included paranasal sinuses
are well-aerated.The included ocular globes and orbital contents are
non-suspicious.

OTHER: None.
IMPRESSION: 1. No acute intracranial process.
2. Moderate atrophy, advanced for age.
3. Old RIGHT basal ganglia and thalamus lacunar infarcts.
4. Moderate chronic small vessel ischemic disease.

## 2019-05-31 NOTE — Progress Notes (Signed)
Physical Therapy Treatment Patient Details Name: Bryan Farrell MRN: [id number] DOB: 11/15/1950 Today's Date: 05/31/2019    History of Present Illness Pt is a 68 y/o male admitted secondary to dry gangrene of the L second toe now s/p amputation of L 2nd ray. PMH including but not limited to DM and HTN.    PT Comments    Pt seen for re-evaluation following L 2nd ray amputation on 05/30/19. Pt having difficulty maintaining TDWB'ing L LE with standing and transfers; therefore, deferred gait training at this time as pt is unsafe and unable to adhere to precautions. Pt would continue to benefit from skilled physical therapy services at this time while admitted and after d/c to address the below listed limitations in order to improve overall safety and independence with functional mobility.    Follow Up Recommendations  SNF     Equipment Recommendations  Rolling walker with 5" wheels    Recommendations for Other Services       Precautions / Restrictions Precautions Precautions: Fall Restrictions Weight Bearing Restrictions: Yes LLE Weight Bearing: Touchdown weight bearing    Mobility  Bed Mobility Overal bed mobility: Modified Independent                Transfers Overall transfer level: Needs assistance Equipment used: Rolling walker (2 wheeled) Transfers: Sit to/from [employer] Sit to Stand: Min guard Stand pivot transfers: Min guard       General transfer comment: cueing for technique and safe hand placement, min guard for safety, cueing for TDWB'ing L LE; pt with great difficulty and unable to maintain  Ambulation/Gait             General Gait Details: deferred as pt unable to maintain TDWB L LE with transfers   Stairs             Wheelchair Mobility    Modified Rankin (Stroke Patients Only)       Balance Overall balance assessment: Needs assistance Sitting-balance support: Feet supported Sitting balance-Leahy Scale:  Good     Standing balance support: Bilateral upper extremity supported Standing balance-Leahy Scale: Poor                              Cognition Arousal/Alertness: Awake/alert Behavior During Therapy: WFL for tasks assessed/performed Overall Cognitive Status: No family/caregiver present to determine baseline cognitive functioning Area of Impairment: Memory;Orientation;Safety/judgement;Problem solving                     Memory: Decreased short-term memory;Decreased recall of precautions   Safety/Judgement: Decreased awareness of safety;Decreased awareness of deficits   Problem Solving: Requires verbal cues        Exercises      General Comments        Pertinent Vitals/Pain Pain Assessment: Faces Faces Pain Scale: Hurts a little bit Pain Location: L foot Pain Descriptors / Indicators: Guarding;Sore Pain Intervention(s): Monitored during session;Repositioned    Home Living                      Prior Function            PT Goals (current goals can now be found in the care plan section) Acute Rehab PT Goals PT Goal Formulation: With patient Time For Goal Achievement: 06/11/19 Potential to Achieve Goals: Good Progress towards PT goals: Progressing toward goals    Frequency    Min 3X/week  PT Plan Current plan remains appropriate    Co-evaluation              AM-PAC PT "6 Clicks" Mobility   Outcome Measure  Help needed turning from your back to your side while in a flat bed without using bedrails?: None Help needed moving from lying on your back to sitting on the side of a flat bed without using bedrails?: None Help needed moving to and from a bed to a chair (including a wheelchair)?: A Little Help needed standing up from a chair using your arms (e.g., wheelchair or bedside chair)?: A Little Help needed to walk in hospital room?: A Lot Help needed climbing 3-5 steps with a railing? : Total 6 Click Score: 17     End of Session Equipment Utilized During Treatment: Gait belt Activity Tolerance: Patient tolerated treatment well Patient left: in chair;with call bell/phone within reach;with chair alarm set Nurse Communication: Mobility status PT Visit Diagnosis: Other abnormalities of gait and mobility (R26.89)     Time: 1062-6948 PT Time Calculation (min) (ACUTE ONLY): 27 min  Charges:  $Therapeutic Activity: 8-22 mins                     Sherie Don, PT, DPT  Acute Rehabilitation Services Pager [phone] Office [place] 05/31/2019, 4:54 PM

## 2019-05-31 NOTE — Progress Notes (Signed)
Patient ID: Bryan Farrell, male   DOB: 03/03/1951, 68 y.o.   MRN: [id number] Postoperative day 1 left foot second ray amputation.  The dressing is dry patient has no complaints.  Minimize weightbearing with therapy.

## 2019-05-31 NOTE — Progress Notes (Signed)
   Subjective:  Patient denies pain. States he is doing ok this morning. He is concerned as he has no where to go once discharged, discussed that he will not be discharged until he has improved and is safe to go. He has not been able to get in touch with family.   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 Pain controlled, ortho on board. ID consulted for bacteremia, now signed, recommend 14 days total abx. ECHO done 10/15 without vegetation.   - cont. Unasyn, transition to linezolid prior to discharge with 14 days total abx - cont. PT/OT - pain control per ortho - dilaudid, robaxin, oxycodone prn - patient is homeless and will require inpatient treatment until stable as he is high risk for reinfection.  - cont. Trying to get in touch with family    Type II DM  A1C 9.0. Previously not on medications.   - continue SSI. CBGs.  VTE: lovenox  IVF: none Diet: HH/CM Code: full  Dispo: Anticipated discharge pending clinical improvement.   Molli Hazard A, DO 05/31/2019, 1:34 PM Pager: [phone]

## 2019-05-31 NOTE — TOC Initial Note (Signed)
Transition of Care (TOC) - Initial/Assessment Note    Patient Details  Name: Bryan Farrell MRN: [id number] Date of Birth: 09/25/1950  Transition of Care (TOC) CM/SW Contact:     E , LCSW Phone Number: [phone] 05/31/2019, 4:12 PM  Clinical Narrative:                  CSW met with patient to discuss PT recommendation of a SNF. Patient was agitated throughout the conversation. CSW discussed the SNF process. Patient stated that he was in agreement with going to a ST SNF.  Patient gave CSW permission to fax referrals out to local facilities. CSW provided patient with medicare. gov rating list. Patient stated that he was unsuccessful with reaching his son and daughter and asked if CSW could attempt to call them. CSW informed patient that follow up would occur once bed offers are given.  CSW attempted reach patient's son and daughter and was unsuccessful.  CSW will continue to monitor for discharge planning.  Expected Discharge Plan: Skilled Nursing Facility Barriers to Discharge: Continued Medical Work up, SNF Pending bed offer   Patient Goals and CMS Choice Patient states their goals for this hospitalization and ongoing recovery are:: To get better CMS Medicare.gov Compare Post Acute Care list provided to:: Patient    Expected Discharge Plan and Services Expected Discharge Plan: Skilled Nursing Facility       Living arrangements for the past 2 months: Homeless Shelter                                      Prior Living Arrangements/Services Living arrangements for the past 2 months: Homeless Shelter   Patient language and need for interpreter reviewed:: Yes                 Activities of Daily Living Home Assistive Devices/Equipment: Cane (specify quad or straight) ADL Screening (condition at time of admission) Patient's cognitive ability adequate to safely complete daily activities?: Yes Is the patient deaf or have difficulty hearing?:  No Does the patient have difficulty seeing, even when wearing glasses/contacts?: No Does the patient have difficulty concentrating, remembering, or making decisions?: No Patient able to express need for assistance with ADLs?: Yes Does the patient have difficulty dressing or bathing?: No Independently performs ADLs?: Yes (appropriate for developmental age) Does the patient have difficulty walking or climbing stairs?: Yes Weakness of Legs: Both Weakness of Arms/Hands: None  Permission Sought/Granted      Share Information with NAME: Kenneth  Permission granted to share info w AGENCY: SNF  Permission granted to share info w Relationship: son  Permission granted to share info w Contact Information: [phone]  Emotional Assessment Appearance:: Appears older than stated age Attitude/Demeanor/Rapport: Angry Affect (typically observed): Agitated Orientation: : Oriented to Self, Oriented to Place, Oriented to  Time, Oriented to Situation      Admission diagnosis:  Wound infection [T14.8XXA, L08.9] Osteomyelitis of other site, unspecified type (HCC) [M86.9] Patient Active Problem List   Diagnosis Date Noted  . 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HCC)   . Bacteremia due to Enterococcus 05/29/2019  . Toe osteomyelitis, left (HCC)   . 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 Mild protein-calorie malnutrition (HCC)   . Osteomyelitis (HCC) 05/27/2019  . Moderate recurrent major depression (HCC)   . Adjustment disorder with depressed mood 04/13/2017  . Vitamin B12 deficiency 03/25/2017  .   Vascular dementia (HCC) 03/23/2017  . Suicidal ideation 03/23/2017  . Homelessness 03/23/2017  . Diabetes (HCC) 03/23/2017   PCP:  Placey, Mary Ann, NP Pharmacy:   [facility] [place], [place] - [address] [place] [address] Phone: [phone] Fax: [phone]     Social Determinants of Health (SDOH) Interventions    Readmission  Risk Interventions Readmission Risk Prevention Plan 05/28/2019  Transportation Screening Complete  PCP or Specialist Appt within 3-5 Days Not Complete  Not Complete comments Continued medical work-up  HRI or Home Care Consult Complete  Social Work Consult for Recovery Care Planning/Counseling Complete  Palliative Care Screening Not Applicable  Medication Review (RN Care Manager) Complete  Some recent data might be hidden    

## 2019-06-01 LAB — CBC
HCT: 30.2 % — ABNORMAL LOW (ref 39.0–52.0)
Hemoglobin: 10 g/dL — ABNORMAL LOW (ref 13.0–17.0)
MCH: 29.7 pg (ref 26.0–34.0)
MCHC: 33.1 g/dL (ref 30.0–36.0)
MCV: 89.6 fL (ref 80.0–100.0)
Platelets: 465 10*3/uL — ABNORMAL HIGH (ref 150–400)
RBC: 3.37 MIL/uL — ABNORMAL LOW (ref 4.22–5.81)
RDW: 13 % (ref 11.5–15.5)
WBC: 9.7 10*3/uL (ref 4.0–10.5)
nRBC: 0 % (ref 0.0–0.2)

## 2019-06-01 LAB — CULTURE, BLOOD (ROUTINE X 2): Special Requests: ADEQUATE

## 2019-06-01 LAB — GLUCOSE, CAPILLARY
Glucose-Capillary: 216 mg/dL — ABNORMAL HIGH (ref 70–99)
Glucose-Capillary: 168 mg/dL — ABNORMAL HIGH (ref 70–99)
Glucose-Capillary: 189 mg/dL — ABNORMAL HIGH (ref 70–99)
Glucose-Capillary: 176 mg/dL — ABNORMAL HIGH (ref 70–99)

## 2019-06-01 LAB — BASIC METABOLIC PANEL
Anion gap: 10 (ref 5–15)
BUN: 12 mg/dL (ref 8–23)
CO2: 27 mmol/L (ref 22–32)
Calcium: 8.2 mg/dL — ABNORMAL LOW (ref 8.9–10.3)
Chloride: 102 mmol/L (ref 98–111)
Creatinine, Ser: 0.75 mg/dL (ref 0.61–1.24)
GFR calc Af Amer: >60 mL/min (ref >60)
GFR calc non Af Amer: >60 mL/min (ref >60)
Glucose, Bld: 215 mg/dL — ABNORMAL HIGH (ref 70–99)
Potassium: 4 mmol/L (ref 3.5–5.1)
Sodium: 139 mmol/L (ref 135–145)

## 2019-06-01 MED ORDER — INSULIN ASPART 100 UNIT/ML ~~LOC~~ SOLN
0.0000 [IU] | Freq: Three times a day (TID) | SUBCUTANEOUS | Status: DC
  Administered 2019-06-01: 3 [IU] via SUBCUTANEOUS
  Administered 2019-06-02: 14:00:00 8 [IU] via SUBCUTANEOUS
  Administered 2019-06-02 (×2): 3 [IU] via SUBCUTANEOUS

## 2019-06-01 MED ORDER — INSULIN GLARGINE 100 UNIT/ML ~~LOC~~ SOLN
5.0000 [IU] | Freq: Every day | SUBCUTANEOUS | Status: DC
  Administered 2019-06-01 – 2019-06-02 (×2): 5 [IU] via SUBCUTANEOUS
  Filled 2019-06-01 (×3): qty 0.05

## 2019-06-01 NOTE — Progress Notes (Signed)
NAME:  Bryan Farrell, MRN:  [id number], DOB:  01/13/1951, LOS: 5 ADMISSION DATE:  05/27/2019,  Primary: Placey, Chales Abrahams, NP  CHIEF COMPLAINT:  Foot pain  Medical Service: Internal Medicine Teaching Service         Attending Physician: Dr. Oswaldo Done, Marquita Palms, *    First Contact: Dr. Ephriam Knuckles Pager: [phone]  Second Contact: Dr. Ephriam Knuckles Pager: [phone]       After Hours (After 5p/  First Contact Pager: [phone]  weekends / holidays): Second Contact Pager: [phone]    Brief History  68 yo male with relevant PMH of DM and remote hx of toe amputation on the right who is presenting per recommendation of staff at the homeless shelter at which he resides for a necrotic left 2nd toe.  Subjective  No complaints.  Wishing that he could talk to his family.  He notes a big argument between he and his son prior to admission.  Significant Hospital Events   10/13 Admission 10/16 left 2nd toe amputation Objective   Blood pressure 125/71, pulse (!) 59, temperature 98.6 F (37 C), temperature source Oral, resp. rate 18, height 6' (1.829 m), weight 88.5 kg, SpO2 95 %.     Intake/Output Summary (Last 24 hours) at 06/01/2019 0525 Last data filed at 05/31/2019 1700 Gross per 24 hour  Intake 1280.63 ml  Output 900 ml  Net 380.63 ml   Examination: General: Chronically ill-appearing Cardiac: Heart regular rate and rhythm Pulmonary: Lung sounds clear to auscultation Abdomen: Soft nontender.  Bowel sounds active. Neuro: Alert and oriented x3   Consults:  Ortho Care management Infectious disease Vascular surgery  Significant Diagnostic Tests:  10/13 left foot xray: findings consistent with osteomyelitis of proximal phalynx   10/14 LLE duplex with ABI: no evidence of stenosis in common femoral, superifical femoral or popliteal arteries. Posterior tibial and peroneal arteries patent with monophasic waveforms. Mid anterior tibial artery + for short segment of occlusion  10/15  Echo: EF 60-65%. elevated LVED pressure. No hypokinesis. No evidence of valvular vegitation  Micro Data:  10/13 Blood cultures>> enterococcus 10/15 blood cultures>>  Antimicrobials:  Zosyn 10/13>>10/14 vanc 10/13>>10/15 Flagyl 10/14>>10/15 Ceftriaxone 10/14>>10/15 Unasyn 10/15>> Labs    CBC Latest Ref Rng & Units 05/30/2019 05/29/2019 05/28/2019  WBC 4.0 - 10.5 K/uL 9.4 8.9 8.3  Hemoglobin 13.0 - 17.0 g/dL 10.4(L) 10.0(L) 10.2(L)  Hematocrit 39.0 - 52.0 % 31.6(L) 30.1(L) 31.0(L)  Platelets 150 - 400 K/uL 478(H) 447(H) 487(H)    Summary  69 yo male who presented with osteomyeltis of left 2nd toe and subsequently found to have enterococci bacteremia.  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 POD #2 s/p amputation. Plan Continue Unasyn PT/OT Dilaudid, robaxin, oxycodone prn   Enterococcus bacteremia.  Noted on blood cultures from 10/13. Remains afebrile.  Vital signs stable.  . Plan Continue Unasyn. Transition to linezolid prior to d/c to complete 14d course of abx  Poorly controlled DM II. A1C 9.0.  Blood sugars are still running fairly high in the hospital.  Will consider addition of Lantus or increasing sliding scale later today.  Best practice:  CODE STATUS: full Diet: DM Pain management: tylenol, Dilaudid, robaxin, oxycodone prn  DVT for prophylaxis: SCDs Family communication: several attempts at reaching pt's son per patient's request. Have been unable to thus far. Will continue efforts. Dispo: SNF   Mitzi Hansen, MD [place] PGY-1 PAGER #: [phone] 06/01/19 5:25 AM

## 2019-06-01 NOTE — Plan of Care (Signed)
  Problem: Pain Managment: Goal: General experience of comfort will improve Outcome: Progressing   Problem: Safety: Goal: Ability to remain free from injury will improve Outcome: Progressing   Problem: Skin Integrity: Goal: Risk for impaired skin integrity will decrease Outcome: Progressing   

## 2019-06-02 ENCOUNTER — Other Ambulatory Visit: Payer: Self-pay | Admitting: Internal Medicine

## 2019-06-02 DIAGNOSIS — R451 Restlessness and agitation: Secondary | ICD-10-CM

## 2019-06-02 DIAGNOSIS — B952 Enterococcus as the cause of diseases classified elsewhere: Secondary | ICD-10-CM

## 2019-06-02 DIAGNOSIS — R7881 Bacteremia: Secondary | ICD-10-CM

## 2019-06-02 LAB — GLUCOSE, CAPILLARY
Glucose-Capillary: 158 mg/dL — ABNORMAL HIGH (ref 70–99)
Glucose-Capillary: 200 mg/dL — ABNORMAL HIGH (ref 70–99)
Glucose-Capillary: 275 mg/dL — ABNORMAL HIGH (ref 70–99)
Glucose-Capillary: 197 mg/dL — ABNORMAL HIGH (ref 70–99)
Glucose-Capillary: 227 mg/dL — ABNORMAL HIGH (ref 70–99)

## 2019-06-02 LAB — SARS CORONAVIRUS 2 (TAT 6-24 HRS): SARS Coronavirus 2: NEGATIVE

## 2019-06-02 LAB — CULTURE, BLOOD (ROUTINE X 2)
Culture: NO GROWTH
Special Requests: ADEQUATE

## 2019-06-02 MED ORDER — LINEZOLID 600 MG PO TABS: 600.0000 mg | ORAL_TABLET | Freq: Two times a day (BID) | ORAL | 0 refills | Status: AC

## 2019-06-02 NOTE — Progress Notes (Signed)
Inpatient Diabetes Program Recommendations  AACE/ADA: New Consensus Statement on Inpatient Glycemic Control (2015)  Target Ranges:  Prepandial:   less than 140 mg/dL      Peak postprandial:   less than 180 mg/dL (1-2 hours)      Critically ill patients:  140 - 180 mg/dL   Lab Results  Component Value Date   GLUCAP 200 (H) 06/02/2019   HGBA1C 9.0 (H) 05/28/2019    Review of Glycemic Control  FBS 200 mg/dL this am Needs tighter control for healing.  Inpatient Diabetes Program Recommendations:     Increase Lantus to 12 units QHS  Will follow closely for trends.  Thank you. Lorenda Peck, RD, LDN, CDE Inpatient Diabetes Coordinator [phone]

## 2019-06-02 NOTE — Progress Notes (Signed)
  Date: 06/02/2019  Patient name: Bryan Farrell  Medical record number: [id number]  Date of birth: 1951-01-04        I have seen and evaluated this patient and I have discussed the plan of care with the house staff. Please see their note for complete details. I concur with their findings.  Patient is postop day 3 from amputation of his left second toe due to osteomyelitis and dry gangrene.  Course complicated by Enterococcus bacteremia, he remains on IV Unasyn (day 6 of 14).  He is medically stable for discharge to SNF when bed available.  Will transition to PO linezolid per ID recommendations to complete his antibiotic course.  Velna Ochs, MD 06/02/2019, 3:54 PM

## 2019-06-02 NOTE — Plan of Care (Signed)
  Problem: Pain Managment: Goal: General experience of comfort will improve Outcome: Progressing   Problem: Safety: Goal: Ability to remain free from injury will improve Outcome: Progressing   Problem: Skin Integrity: Goal: Risk for impaired skin integrity will decrease Outcome: Progressing   

## 2019-06-02 NOTE — TOC Progression Note (Addendum)
Transition of Care [facility]) - Progression Note    Patient Details  Name: [name] MRN: [id number] Date of Birth: [birth year]  Transition of Care [facility]) CM/SW Contact  Jacalyn Lefevre Edson Snowball, RN Phone Number: 06/02/2019, 1:24 PM  Clinical Narrative:     Provided patient with SNF bed offers. Patient has selected [facility]. Called [name] with [facility] left message.   Will need new covid test. Once confirmed with [name] patient accepted will see when covid needs to be done.   [name] with [facility] can take patient tomorrow if medically stable, will need new covid test. Paged and chatted DR R Christian . Dr Darrick Meigs will order covid test. Plan to discharge patient on linezoid asked [name] if she can still take , awaiting response . [name] is asking if TOC can fill linezolid prescription and send medication with patient. Sent message to [facility] awaiting call back.   TOC can fill linezoid for $18.00. Will get $18 from petty cash tomorrow to cover medication.    Expected Discharge Plan: Skilled Nursing Facility Barriers to Discharge: Continued Medical Work up, SNF Pending bed offer  Expected Discharge Plan and Services Expected Discharge Plan: [place] arrangements for the past 2 months: Homeless Shelter                                       Social Determinants of Health (SDOH) Interventions    Readmission Risk Interventions Readmission Risk Prevention Plan 05/28/2019  Transportation Screening Complete  PCP or Specialist Appt within 3-5 Days Not Complete  Not Complete comments Continued medical work-up  HRI or [place] Complete  Social Work Consult for [place] Planning/Counseling Complete  Palliative Care Screening Not Applicable  Medication Review Press photographer) Complete  Some recent data might be hidden

## 2019-06-02 NOTE — Plan of Care (Signed)

## 2019-06-02 NOTE — Progress Notes (Signed)
Physical Therapy Treatment Patient Details Name: Bryan Farrell MRN: [id number] DOB: 1951/04/18 Today's Date: 06/02/2019    History of Present Illness Pt is a 68 y/o male admitted secondary to dry gangrene of the L second toe now s/p amputation of L 2nd ray. PMH including but not limited to DM and HTN.    PT Comments    Pt was agreeable and cooperative with therapy today, as well as able to demonstrate significant improvements in ability to maintain TDWB status for LLE. The patient completed all bed mobility without assist, and sit-stand transfers with use of RN and supervision. Although previous notes indicate need to practice stairs, Pt denied need today during therapy, stating that he did not know that there were stairs where he had been living. The patient completed a 5xsit-to-stand (5XSTS) in 71s with supervision, good maintenance of TDWB, and use of RW. This is above the age-based cut-off of 11.4s and therefore indicates they are at increased risk for falls. The patient will therefore continue to benefit from skilled PT to address limitations in functional strength, mobility, and balance while maintaining TWDB status.     Follow Up Recommendations  SNF     Equipment Recommendations  Rolling walker with 5" wheels    Recommendations for Other Services       Precautions / Restrictions Precautions Precautions: Fall Restrictions Weight Bearing Restrictions: Yes LLE Weight Bearing: Touchdown weight bearing    Mobility  Bed Mobility Overal bed mobility: Modified Independent Bed Mobility: Supine to Sit     Supine to sit: Supervision     General bed mobility comments: increased time, HOB flat  Transfers Overall transfer level: Needs assistance Equipment used: Rolling walker (2 wheeled) Transfers: Sit to/from Stand Sit to Stand: Min guard Stand pivot transfers: Min guard       General transfer comment: no longer needs VCs for hand placement, able to maintain TDWB  of LLE, especially with repetitions  Ambulation/Gait Ambulation/Gait assistance: Min guard Gait Distance (Feet): 20 Feet Assistive device: Rolling walker (2 wheeled)(Pt request to return to use of quad cane next time) Gait Pattern/deviations: Step-through pattern Gait velocity: decreased   General Gait Details: Pt demos improved maintainence of TDWB status, ambulates with antalgic gait due to adherence to TDWB   Stairs Stairs: (Although previous note indicates Pt needs to practice stairs, Pt denies need for stairs, reports he does not remember having to do any where he was living.)           Wheelchair Mobility    Modified Rankin (Stroke Patients Only)       Balance Overall balance assessment: Needs assistance Sitting-balance support: Feet supported Sitting balance-Leahy Scale: Good     Standing balance support: Bilateral upper extremity supported Standing balance-Leahy Scale: Poor                              Cognition Arousal/Alertness: Awake/alert Behavior During Therapy: WFL for tasks assessed/performed Overall Cognitive Status: No family/caregiver present to determine baseline cognitive functioning Area of Impairment: Safety/judgement;Problem solving                         Safety/Judgement: Decreased awareness of safety;Decreased awareness of deficits   Problem Solving: Slow processing General Comments: Pt very compliant and with improved adherence to WB status, but remains frustrated by situation, specifically his inability to get in contact with kids.      Exercises  General Comments        Pertinent Vitals/Pain Pain Assessment: 0-10 Pain Score: 4  Pain Location: L foot Pain Descriptors / Indicators: Guarding;Sore Pain Intervention(s): Repositioned    Home Living                      Prior Function            PT Goals (current goals can now be found in the care plan section) Acute Rehab PT Goals Patient  Stated Goal: to talk to his son or daughter PT Goal Formulation: With patient Time For Goal Achievement: 06/11/19 Potential to Achieve Goals: Good Progress towards PT goals: Progressing toward goals    Frequency    Min 3X/week      PT Plan Current plan remains appropriate    Co-evaluation              AM-PAC PT "6 Clicks" Mobility   Outcome Measure  Help needed turning from your back to your side while in a flat bed without using bedrails?: None Help needed moving from lying on your back to sitting on the side of a flat bed without using bedrails?: None Help needed moving to and from a bed to a chair (including a wheelchair)?: A Little Help needed standing up from a chair using your arms (e.g., wheelchair or bedside chair)?: A Little Help needed to walk in hospital room?: A Little Help needed climbing 3-5 steps with a railing? : A Lot 6 Click Score: 19    End of Session Equipment Utilized During Treatment: Gait belt Activity Tolerance: Patient tolerated treatment well Patient left: in chair;with call bell/phone within reach;with chair alarm set   PT Visit Diagnosis: Other abnormalities of gait and mobility (R26.89)     Time: 6269-4854 PT Time Calculation (min) (ACUTE ONLY): 23 min  Charges:  $Gait Training: 8-22 mins $Therapeutic Exercise: 8-22 mins                     Metro Kung, PT, DPT

## 2019-06-02 NOTE — Progress Notes (Signed)
NAME:  Bryan Farrell, MRN:  [id number], DOB:  1950/12/25, LOS: 6 ADMISSION DATE:  05/27/2019,  Primary: Placey, Audrea Muscat, NP  CHIEF COMPLAINT:  Foot pain  Medical Service: Internal Medicine Teaching Service         Attending Physician: Dr. Evette Doffing, Mallie Mussel, *    First Contact: Dr. Darrick Meigs Pager: [phone]  Second Contact: Dr. Darrick Meigs Pager: [phone]       After Hours (After 5p/  First Contact Pager: [phone]  weekends / holidays): Second Contact Pager: [phone]    Brief History  68 yo male with relevant PMH of DM and remote hx of toe amputation on the right who is presenting per recommendation of staff at the homeless shelter at which he resides for a necrotic left 2nd toe.  Subjective  No complaints.  Wishing that he could talk to his family.  He notes a big argument between he and his son prior to admission.  Significant Hospital Events   10/13 Admission 10/16 left 2nd toe amputation Objective   Blood pressure (!) 110/56, pulse (!) 57, temperature 98.5 F (36.9 C), temperature source Oral, resp. rate 16, height 6' (1.829 m), weight 88.5 kg, SpO2 96 %.     Examination: General: Chronically ill-appearing male Cardiac: Heart regular rhythm Pulmonary: Lung sounds clear to auscultation Musculoskeletal: Intact dressing to the left lower extremity Psych: Agitated affect   Consults:  Ortho Care management Infectious disease Vascular surgery  Significant Diagnostic Tests:  10/13 left foot xray: findings consistent with osteomyelitis of proximal phalynx   10/14 LLE duplex with ABI: no evidence of stenosis in common femoral, superifical femoral or popliteal arteries. Posterior tibial and peroneal arteries patent with monophasic waveforms. Mid anterior tibial artery + for short segment of occlusion  10/15 Echo: EF 60-65%. elevated LVED pressure. No hypokinesis. No evidence of valvular vegitation  Micro Data:  10/13 Blood cultures>> enterococcus 10/15 blood  cultures>>  Antimicrobials:  Zosyn 10/13>>10/14 vanc 10/13>>10/15 Flagyl 10/14>>10/15 Ceftriaxone 10/14>>10/15 Unasyn 10/15>> Labs    CBC Latest Ref Rng & Units 06/01/2019 05/30/2019 05/29/2019  WBC 4.0 - 10.5 K/uL 9.7 9.4 8.9  Hemoglobin 13.0 - 17.0 g/dL 10.0(L) 10.4(L) 10.0(L)  Hematocrit 39.0 - 52.0 % 30.2(L) 31.6(L) 30.1(L)  Platelets 150 - 400 K/uL 465(H) 478(H) 447(H)    Summary  68 yo male who presented with osteomyeltis of left 2nd toe and subsequently found to have enterococci bacteremia.  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 POD #3 s/p amputation. VSS Plan Continue Unasyn PT/OT Dilaudid, robaxin, oxycodone prn   Enterococcus bacteremia.  Noted on blood cultures from 10/13.  Plan Continue Unasyn--day #6/14. Transition to linezolid prior to d/c to complete 14d course of abx  Poorly controlled DM II. A1C 9.0. continue lantus +SSI.  Continue CBGs. Diabetic diet  Best practice:  CODE STATUS: full Diet: DM Pain management: tylenol, Dilaudid, robaxin, oxycodone prn  DVT for prophylaxis: SCDs Family communication: several attempts at reaching pt's son per patient's request. Have been unable to thus far. Will continue efforts. Dispo: SNF. apps sent.  Will need Covid prior to discharge   Mitzi Hansen, MD [place] PGY-1 PAGER #: [phone] 06/02/19 5:16 AM

## 2019-06-03 DIAGNOSIS — F339 Major depressive disorder, recurrent, unspecified: Secondary | ICD-10-CM

## 2019-06-03 LAB — CULTURE, BLOOD (ROUTINE X 2)
Culture: NO GROWTH
Culture: NO GROWTH
Special Requests: ADEQUATE

## 2019-06-03 LAB — GLUCOSE, CAPILLARY
Glucose-Capillary: 154 mg/dL — ABNORMAL HIGH (ref 70–99)
Glucose-Capillary: 131 mg/dL — ABNORMAL HIGH (ref 70–99)

## 2019-06-03 MED ORDER — METHOCARBAMOL 500 MG PO TABS: 500.0000 mg | ORAL_TABLET | Freq: Four times a day (QID) | ORAL | 0 refills | Status: DC | PRN

## 2019-06-03 MED ORDER — INSULIN ASPART 100 UNIT/ML ~~LOC~~ SOLN: 0.0000 [IU] | Freq: Three times a day (TID) | SUBCUTANEOUS | 11 refills | Status: DC

## 2019-06-03 MED ORDER — OXYCODONE HCL 5 MG PO TABS: 5.0000 mg | ORAL_TABLET | ORAL | 0 refills | Status: DC | PRN

## 2019-06-03 MED ORDER — INSULIN GLARGINE 100 UNIT/ML ~~LOC~~ SOLN: 5.0000 [IU] | Freq: Every day | SUBCUTANEOUS | 11 refills | Status: DC

## 2019-06-03 MED ORDER — INSULIN ASPART 100 UNIT/ML ~~LOC~~ SOLN
0.0000 [IU] | Freq: Three times a day (TID) | SUBCUTANEOUS | Status: DC
  Administered 2019-06-03: 3 [IU] via SUBCUTANEOUS
  Administered 2019-06-03: 08:00:00 4 [IU] via SUBCUTANEOUS

## 2019-06-03 MED ORDER — INSULIN ASPART 100 UNIT/ML ~~LOC~~ SOLN: 0.0000 [IU] | Freq: Every day | SUBCUTANEOUS | Status: DC

## 2019-06-03 MED FILL — LINEZOLID 600 MG TABS: 600 | 7 days supply | Qty: 14 | Fill #0

## 2019-06-03 NOTE — Care Management Important Message (Signed)
Important Message  Patient Details  Name: Bryan Farrell MRN: [id number] Date of Birth: 1950/12/21   Medicare Important Message Given:  Yes     [name] 06/03/2019, 1:56 PM

## 2019-06-03 NOTE — Plan of Care (Signed)
  Problem: Pain Managment: Goal: General experience of comfort will improve Outcome: Progressing   Problem: Safety: Goal: Ability to remain free from injury will improve Outcome: Progressing   Problem: Skin Integrity: Goal: Risk for impaired skin integrity will decrease Outcome: Progressing   

## 2019-06-03 NOTE — Progress Notes (Signed)
   Subjective:  Bryan Farrell continues to be disappointed that he is unable to get in touch with his children and that we have been unable to do so at well. He does state he feels depressed but feels this is related to being unable to contact his kids. He thinks he has had depression in the past but is not sure if he took medication previously. He denies SI or wanting to hurt himself.   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 Will switch to linezolid prior to discharge. He is stable for discharge when SNF bed becomes available   - cont. unasyn  - cont. PT/OT  - dilaudid, robaxin, oxycodone prn   Type IIDM - cont. lantus and SSI   VTE: lovenox  IVF: none  Diet: CM Code: full   Dispo: Anticipated discharge today.   Molli Hazard A, DO 06/03/2019, 3:03 PM Pager: [phone]

## 2019-06-03 NOTE — TOC Progression Note (Signed)
Transition of Care [facility]) - Progression Note    Patient Details  Name: Bryan Farrell MRN: [id number] Date of Birth: 1951-01-13  [facility]) CM/SW Contact  Bryan Farrell, Bryan Snowball, RN Phone Number: 06/03/2019, 11:19 AM  Clinical Narrative:     Bryan Farrell with [facility] can accept patient today.   MD and bedside nurse aware.   NCM filled script for Linezolid and placed with PTAR papers. MD and bedside nurse aware.   Bedside nurse to call report to [phone]. Patient's room number is 104.   PTAR paperwork completed. Will call PTAR after dc order placed and DC summary complete.  Expected Discharge Plan: Skilled Nursing Facility Barriers to Discharge: No Barriers Identified  Expected Discharge Plan and Services Expected Discharge Plan: [place]   Discharge Planning Services: CM Consult Post Acute Care Choice: [place] Living arrangements for the past 2 months: Homeless                 DME Arranged: N/A         HH Arranged: NA           Social Determinants of Health (SDOH) Interventions    Readmission Risk Interventions Readmission Risk Prevention Plan 05/28/2019  Transportation Screening Complete  PCP or Specialist Appt within 3-5 Days Not Complete  Not Complete comments Continued medical work-up  HRI or [place] Complete  Social Work Consult for [place] Planning/Counseling Complete  Palliative Care Screening Not Applicable  Medication Review Press photographer) Complete  Some recent data might be hidden

## 2019-06-03 NOTE — Progress Notes (Signed)
  Date: 06/03/2019  Patient name: Bryan Farrell  Medical record number: [id number]  Date of birth: September 09, 1950        I have seen and evaluated this patient and I have discussed the plan of care with the house staff. Please see their note for complete details. I concur with their findings.  Velna Ochs, MD 06/03/2019, 6:34 PM

## 2019-06-03 NOTE — Discharge Summary (Signed)
 Name: Bryan Farrell MRN: [id number] DOB: 08/07/1951 68 y.o. PCP: Placey, Mary Ann, NP  Date of Admission: 05/27/2019  1:31 PM Date of Discharge:  Attending Physician: Guilloud, Carolyn, MD  Discharge Diagnosis: 1. 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 Enterococcus bacteremia 3. Depression. Hx of suicide attempt Discharge Medications: Allergies as of 06/03/2019   No Known Allergies     Medication List    TAKE these medications   acetaminophen 500 MG tablet Commonly known as: TYLENOL Take 1 tablet (500 mg total) by mouth every 6 (six) hours as needed.   gabapentin 100 MG capsule Commonly known as: Neurontin Take 1 capsule (100 mg total) by mouth 3 (three) times daily.   insulin aspart 100 UNIT/ML injection Commonly known as: novoLOG Inject 0-20 Units into the skin 3 (three) times daily with meals.   insulin glargine 100 UNIT/ML injection Commonly known as: LANTUS Inject 0.05 mLs (5 Units total) into the skin at bedtime.   linezolid 600 MG tablet Commonly known as: ZYVOX Take 1 tablet (600 mg total) by mouth 2 (two) times daily for 7 days.   methocarbamol 500 MG tablet Commonly known as: ROBAXIN Take 1 tablet (500 mg total) by mouth every 6 (six) hours as needed for muscle spasms.   oxyCODONE 5 MG immediate release tablet Commonly known as: Oxy IR/ROXICODONE Take 1-2 tablets (5-10 mg total) by mouth every 4 (four) hours as needed for up to 5 days for moderate pain (pain score 4-6).       Disposition and follow-up:   Mr.Bryan Farrell was discharged from [facility] in Stable condition.  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 Will need follow up with orthopedics. Monitor for s/s of infection. PT/OT. Wound care. Enterococcus Bacteremia. Received 7d course of Unasyn. Will need to continue outpatient with 7d course of levaquin for a total of  14d of therapy. Depression. Hx of suicide attempt in 2011. Pt exhibited flat and somewhat irritated affect during hospitalization. Repeatedly denied current suicidal plans. Was also seen on 04/15/19 by psychiatry at which time they noted similar findings however did not feel that he met inpatient criteria at that time. Patient offered and refused pharmaceutical treatment at this time. Encouraged him to follow up with PCP about this after discharge.   Labs / imaging needed at time of follow-up: Monitor for s/s of infection.  3.  Pending labs/ test needing follow-up: none Follow-up Appointments:  Contact information for follow-up providers    Duda, Marcus V, MD In 1 week.   Specialty: Orthopedic Surgery Contact information: [address] [place] [address] [phone]            Contact information for after-discharge care    Destination    [facility] Preferred SNF .   Service: Skilled Nursing Contact information: [address] [place] [address] [phone]                  Hospital Course by problem list: [name] is a 68 yo male with PMH of DM II who presented on 10/13 for evaluation and management of infectious appearing left 2nd toe. Imaging and labs suggested dry gangrene with osteomyeletis. Vascular imaging suggested appropriate circulation for wound healing. Amputation of 2nd toe performed 10/16. PT consulted and suggested SNF placement for rehabilitation due to difficulty with ambulation.   He was also   noted to have enterococcus bacteremia on admission. ID consulted and recommended 14d course of treatment. Received Unasyn during his hospitalization and was recommended to receive 7d course of levaquin outpatient to complete the 14d course.  Patient also did seem depressed during his hospitalization. He related this to being unable to talk to his children. Several attempts were made during his hospitalization to contact both  his son and his daughter however they were unable to be reached.   Discharge Vitals:   BP 119/70 (BP Location: Right Arm)   Pulse 62   Temp 98.4 F (36.9 C) (Oral)   Resp 18   Ht 6' (1.829 m)   Wt 88.5 kg   SpO2 96%   BMI 26.46 kg/m   Pertinent Labs, Studies, and Procedures:   10/13 left foot xray: findings consistent with osteomyelitis of proximal phalynx   10/14 LLE duplex with ABI: no evidence of stenosis in common femoral, superifical femoral or popliteal arteries. Posterior tibial and peroneal arteries patent with monophasic waveforms. Mid anterior tibial artery + for short segment of occlusion  10/15 Echo: EF 60-65%. elevated LVED pressure. No hypokinesis. No evidence of valvular vegitation   Discharge Instructions: follow up with Dr. Sharol Given (orthopedics) post hospital. Discharge Instructions    Diet - low sodium heart healthy   Complete by: As directed    Increase activity slowly   Complete by: As directed       Signed: Mitzi Hansen, MD 06/03/2019, 12:36 PM   Pager: [phone]

## 2019-06-03 NOTE — Progress Notes (Signed)
Report given to [place] at [facility] at this time

## 2020-04-23 ENCOUNTER — Ambulatory Visit: Payer: Medicare Other | Admitting: Podiatry

## 2021-08-25 ENCOUNTER — Inpatient Hospital Stay (HOSPITAL_COMMUNITY)
Admission: EM | Admit: 2021-08-25 | Discharge: 2021-09-02 | DRG: 872 | Disposition: A | Discharge status: Skilled Nursing Facility | Payer: Medicare Other | Source: Skilled Nursing Facility | Attending: Internal Medicine | Admitting: Internal Medicine

## 2021-08-25 ENCOUNTER — Other Ambulatory Visit: Payer: Self-pay

## 2021-08-25 ENCOUNTER — Emergency Department (HOSPITAL_COMMUNITY): Payer: Medicare Other

## 2021-08-25 ENCOUNTER — Encounter (HOSPITAL_COMMUNITY): Payer: Self-pay | Admitting: Emergency Medicine

## 2021-08-25 ENCOUNTER — Inpatient Hospital Stay (HOSPITAL_COMMUNITY): Payer: Medicare Other

## 2021-08-25 DIAGNOSIS — A419 Sepsis, unspecified organism: Secondary | ICD-10-CM | POA: Diagnosis not present

## 2021-08-25 DIAGNOSIS — E785 Hyperlipidemia, unspecified: Secondary | ICD-10-CM | POA: Diagnosis present

## 2021-08-25 DIAGNOSIS — F32A Depression, unspecified: Secondary | ICD-10-CM | POA: Diagnosis not present

## 2021-08-25 DIAGNOSIS — L0390 Cellulitis, unspecified: Secondary | ICD-10-CM | POA: Diagnosis not present

## 2021-08-25 DIAGNOSIS — F4320 Adjustment disorder, unspecified: Secondary | ICD-10-CM | POA: Diagnosis present

## 2021-08-25 DIAGNOSIS — E876 Hypokalemia: Secondary | ICD-10-CM | POA: Diagnosis present

## 2021-08-25 DIAGNOSIS — Z79899 Other long term (current) drug therapy: Secondary | ICD-10-CM | POA: Diagnosis not present

## 2021-08-25 DIAGNOSIS — Z89421 Acquired absence of other right toe(s): Secondary | ICD-10-CM | POA: Diagnosis not present

## 2021-08-25 DIAGNOSIS — R6520 Severe sepsis without septic shock: Secondary | ICD-10-CM | POA: Diagnosis present

## 2021-08-25 DIAGNOSIS — E872 Acidosis, unspecified: Secondary | ICD-10-CM | POA: Diagnosis not present

## 2021-08-25 DIAGNOSIS — E119 Type 2 diabetes mellitus without complications: Secondary | ICD-10-CM

## 2021-08-25 DIAGNOSIS — F0153 Vascular dementia, unspecified severity, with mood disturbance: Secondary | ICD-10-CM | POA: Diagnosis present

## 2021-08-25 DIAGNOSIS — J101 Influenza due to other identified influenza virus with other respiratory manifestations: Secondary | ICD-10-CM | POA: Diagnosis not present

## 2021-08-25 DIAGNOSIS — R4189 Other symptoms and signs involving cognitive functions and awareness: Secondary | ICD-10-CM | POA: Diagnosis present

## 2021-08-25 DIAGNOSIS — L089 Local infection of the skin and subcutaneous tissue, unspecified: Secondary | ICD-10-CM | POA: Diagnosis not present

## 2021-08-25 DIAGNOSIS — L03116 Cellulitis of left lower limb: Secondary | ICD-10-CM | POA: Diagnosis present

## 2021-08-25 DIAGNOSIS — I959 Hypotension, unspecified: Secondary | ICD-10-CM | POA: Diagnosis not present

## 2021-08-25 DIAGNOSIS — Z89422 Acquired absence of other left toe(s): Secondary | ICD-10-CM

## 2021-08-25 DIAGNOSIS — N179 Acute kidney failure, unspecified: Secondary | ICD-10-CM | POA: Diagnosis present

## 2021-08-25 DIAGNOSIS — J439 Emphysema, unspecified: Secondary | ICD-10-CM | POA: Diagnosis present

## 2021-08-25 DIAGNOSIS — Z515 Encounter for palliative care: Secondary | ICD-10-CM

## 2021-08-25 DIAGNOSIS — E1140 Type 2 diabetes mellitus with diabetic neuropathy, unspecified: Secondary | ICD-10-CM | POA: Diagnosis present

## 2021-08-25 DIAGNOSIS — D649 Anemia, unspecified: Secondary | ICD-10-CM | POA: Diagnosis present

## 2021-08-25 DIAGNOSIS — J9811 Atelectasis: Secondary | ICD-10-CM | POA: Diagnosis present

## 2021-08-25 DIAGNOSIS — E1165 Type 2 diabetes mellitus with hyperglycemia: Secondary | ICD-10-CM | POA: Diagnosis present

## 2021-08-25 DIAGNOSIS — Z7189 Other specified counseling: Secondary | ICD-10-CM | POA: Diagnosis not present

## 2021-08-25 DIAGNOSIS — Z20822 Contact with and (suspected) exposure to covid-19: Secondary | ICD-10-CM | POA: Diagnosis present

## 2021-08-25 DIAGNOSIS — E1159 Type 2 diabetes mellitus with other circulatory complications: Secondary | ICD-10-CM

## 2021-08-25 DIAGNOSIS — I119 Hypertensive heart disease without heart failure: Secondary | ICD-10-CM | POA: Diagnosis present

## 2021-08-25 DIAGNOSIS — Z794 Long term (current) use of insulin: Secondary | ICD-10-CM

## 2021-08-25 DIAGNOSIS — R748 Abnormal levels of other serum enzymes: Secondary | ICD-10-CM | POA: Diagnosis present

## 2021-08-25 DIAGNOSIS — L97519 Non-pressure chronic ulcer of other part of right foot with unspecified severity: Secondary | ICD-10-CM

## 2021-08-25 DIAGNOSIS — Z23 Encounter for immunization: Secondary | ICD-10-CM

## 2021-08-25 DIAGNOSIS — F0150 Vascular dementia without behavioral disturbance: Secondary | ICD-10-CM | POA: Diagnosis not present

## 2021-08-25 DIAGNOSIS — L02416 Cutaneous abscess of left lower limb: Secondary | ICD-10-CM | POA: Diagnosis not present

## 2021-08-25 DIAGNOSIS — F01C18 Vascular dementia, severe, with other behavioral disturbance: Secondary | ICD-10-CM | POA: Diagnosis not present

## 2021-08-25 DIAGNOSIS — R609 Edema, unspecified: Secondary | ICD-10-CM | POA: Diagnosis not present

## 2021-08-25 DIAGNOSIS — D72825 Bandemia: Secondary | ICD-10-CM | POA: Diagnosis not present

## 2021-08-25 LAB — CBC WITH DIFFERENTIAL/PLATELET
Abs Immature Granulocytes: 0.2 10*3/uL — ABNORMAL HIGH (ref 0.00–0.07)
Basophils Absolute: 0.1 10*3/uL (ref 0.0–0.1)
Basophils Relative: 0 %
Eosinophils Absolute: 0 10*3/uL (ref 0.0–0.5)
Eosinophils Relative: 0 %
HCT: 41.5 % (ref 39.0–52.0)
Hemoglobin: 13.9 g/dL (ref 13.0–17.0)
Immature Granulocytes: 1 %
Lymphocytes Relative: 4 %
Lymphs Abs: 0.8 10*3/uL (ref 0.7–4.0)
MCH: 29.4 pg (ref 26.0–34.0)
MCHC: 33.5 g/dL (ref 30.0–36.0)
MCV: 87.9 fL (ref 80.0–100.0)
Monocytes Absolute: 1.1 10*3/uL — ABNORMAL HIGH (ref 0.1–1.0)
Monocytes Relative: 5 %
Neutro Abs: 19.5 10*3/uL — ABNORMAL HIGH (ref 1.7–7.7)
Neutrophils Relative %: 90 %
Platelets: 281 10*3/uL (ref 150–400)
RBC: 4.72 MIL/uL (ref 4.22–5.81)
RDW: 13.2 % (ref 11.5–15.5)
WBC: 21.6 10*3/uL — ABNORMAL HIGH (ref 4.0–10.5)
nRBC: 0 % (ref 0.0–0.2)

## 2021-08-25 LAB — COMPREHENSIVE METABOLIC PANEL
ALT: 32 U/L (ref 0–44)
AST: 50 U/L — ABNORMAL HIGH (ref 15–41)
Albumin: 3 g/dL — ABNORMAL LOW (ref 3.5–5.0)
Alkaline Phosphatase: 78 U/L (ref 38–126)
Anion gap: 9 (ref 5–15)
BUN: 31 mg/dL — ABNORMAL HIGH (ref 8–23)
CO2: 27 mmol/L (ref 22–32)
Calcium: 8.2 mg/dL — ABNORMAL LOW (ref 8.9–10.3)
Chloride: 101 mmol/L (ref 98–111)
Creatinine, Ser: 1.33 mg/dL — ABNORMAL HIGH (ref 0.61–1.24)
GFR, Estimated: 58 mL/min — ABNORMAL LOW (ref >60)
Glucose, Bld: 223 mg/dL — ABNORMAL HIGH (ref 70–99)
Potassium: 3.3 mmol/L — ABNORMAL LOW (ref 3.5–5.1)
Sodium: 137 mmol/L (ref 135–145)
Total Bilirubin: 1.2 mg/dL (ref 0.3–1.2)
Total Protein: 7.6 g/dL (ref 6.5–8.1)

## 2021-08-25 LAB — BLOOD GAS, ARTERIAL
Acid-Base Excess: 2.3 mmol/L — ABNORMAL HIGH (ref 0.0–2.0)
Acid-Base Excess: 1.8 mmol/L (ref 0.0–2.0)
Bicarbonate: 25.7 mmol/L (ref 20.0–28.0)
Bicarbonate: 25.2 mmol/L (ref 20.0–28.0)
Drawn by: 331421
FIO2: 21
O2 Saturation: 91.6 %
O2 Saturation: 93.4 %
Patient temperature: 98.6
Patient temperature: 98.4
pCO2 arterial: 37.3 mmHg (ref 32.0–48.0)
pCO2 arterial: 37 mmHg (ref 32.0–48.0)
pH, Arterial: 7.452 — ABNORMAL HIGH (ref 7.350–7.450)
pH, Arterial: 7.448 (ref 7.350–7.450)
pO2, Arterial: 59.7 mmHg — ABNORMAL LOW (ref 83.0–108.0)
pO2, Arterial: 63.1 mmHg — ABNORMAL LOW (ref 83.0–108.0)

## 2021-08-25 LAB — PROTIME-INR
INR: 1.1 (ref 0.8–1.2)
Prothrombin Time: 14.3 seconds (ref 11.4–15.2)

## 2021-08-25 LAB — RESP PANEL BY RT-PCR (FLU A&B, COVID) ARPGX2
Influenza A by PCR: POSITIVE — AB
Influenza B by PCR: NEGATIVE
SARS Coronavirus 2 by RT PCR: NEGATIVE

## 2021-08-25 LAB — APTT: aPTT: 34 seconds (ref 24–36)

## 2021-08-25 LAB — CBG MONITORING, ED: Glucose-Capillary: 166 mg/dL — ABNORMAL HIGH (ref 70–99)

## 2021-08-25 LAB — URINALYSIS, ROUTINE W REFLEX MICROSCOPIC
Bacteria, UA: NONE SEEN
Bilirubin Urine: NEGATIVE
Glucose, UA: >=500 mg/dL — AB
Ketones, ur: 5 mg/dL — AB
Leukocytes,Ua: NEGATIVE
Nitrite: NEGATIVE
Protein, ur: >=300 mg/dL — AB
Specific Gravity, Urine: 1.024 (ref 1.005–1.030)
pH: 5 (ref 5.0–8.0)

## 2021-08-25 LAB — LACTIC ACID, PLASMA
Lactic Acid, Venous: 1.4 mmol/L (ref 0.5–1.9)
Lactic Acid, Venous: 8.3 mmol/L (ref 0.5–1.9)
Lactic Acid, Venous: >9 mmol/L (ref 0.5–1.9)
Lactic Acid, Venous: >9 mmol/L (ref 0.5–1.9)

## 2021-08-25 LAB — HIV ANTIBODY (ROUTINE TESTING W REFLEX): HIV Screen 4th Generation wRfx: NONREACTIVE

## 2021-08-25 LAB — MAGNESIUM: Magnesium: 2 mg/dL (ref 1.7–2.4)

## 2021-08-25 MED ORDER — ONDANSETRON HCL 4 MG/2ML IJ SOLN
4.0000 mg | Freq: Four times a day (QID) | INTRAMUSCULAR | Status: DC | PRN
  Administered 2021-08-31: 4 mg via INTRAVENOUS
  Filled 2021-08-25: qty 2

## 2021-08-25 MED ORDER — INSULIN ASPART 100 UNIT/ML IJ SOLN
0.0000 [IU] | Freq: Three times a day (TID) | INTRAMUSCULAR | Status: DC
  Administered 2021-08-26 – 2021-08-27 (×2): 3 [IU] via SUBCUTANEOUS
  Administered 2021-08-27: 5 [IU] via SUBCUTANEOUS
  Administered 2021-08-28 (×2): 3 [IU] via SUBCUTANEOUS
  Administered 2021-08-28: 2 [IU] via SUBCUTANEOUS
  Administered 2021-08-29 (×3): 3 [IU] via SUBCUTANEOUS
  Administered 2021-08-31 – 2021-09-01 (×4): 2 [IU] via SUBCUTANEOUS
  Filled 2021-08-25: qty 0.15

## 2021-08-25 MED ORDER — LACTATED RINGERS IV SOLN: INTRAVENOUS | Status: DC

## 2021-08-25 MED ORDER — LACTATED RINGERS IV BOLUS (SEPSIS)
1000.0000 mL | Freq: Once | INTRAVENOUS | Status: AC
  Administered 2021-08-25: 1000 mL via INTRAVENOUS

## 2021-08-25 MED ORDER — OSELTAMIVIR PHOSPHATE 75 MG PO CAPS: 75.0000 mg | ORAL_CAPSULE | Freq: Once | ORAL | Status: DC

## 2021-08-25 MED ORDER — METRONIDAZOLE 500 MG/100ML IV SOLN
500.0000 mg | Freq: Two times a day (BID) | INTRAVENOUS | Status: DC
  Administered 2021-08-25 – 2021-08-26 (×2): 500 mg via INTRAVENOUS
  Filled 2021-08-25 (×2): qty 100

## 2021-08-25 MED ORDER — ACETAMINOPHEN 325 MG PO TABS
ORAL_TABLET | ORAL | Status: AC
  Filled 2021-08-25: qty 1

## 2021-08-25 MED ORDER — ACETAMINOPHEN 325 MG PO TABS
650.0000 mg | ORAL_TABLET | Freq: Four times a day (QID) | ORAL | Status: DC | PRN
  Filled 2021-08-25: qty 2

## 2021-08-25 MED ORDER — SODIUM CHLORIDE 0.9 % IV SOLN
2.0000 g | Freq: Three times a day (TID) | INTRAVENOUS | Status: DC
  Administered 2021-08-25 – 2021-08-26 (×3): 2 g via INTRAVENOUS
  Filled 2021-08-25 (×2): qty 2

## 2021-08-25 MED ORDER — POTASSIUM CHLORIDE 20 MEQ PO PACK: 40.0000 meq | PACK | Freq: Once | ORAL | Status: DC

## 2021-08-25 MED ORDER — ONDANSETRON HCL 4 MG PO TABS: 4.0000 mg | ORAL_TABLET | Freq: Four times a day (QID) | ORAL | Status: DC | PRN

## 2021-08-25 MED ORDER — VANCOMYCIN HCL 1750 MG/350ML IV SOLN
1750.0000 mg | INTRAVENOUS | Status: DC
  Administered 2021-08-25: 21:00:00 1750 mg via INTRAVENOUS
  Filled 2021-08-25: qty 350

## 2021-08-25 MED ORDER — ENOXAPARIN SODIUM 40 MG/0.4ML IJ SOSY
40.0000 mg | PREFILLED_SYRINGE | INTRAMUSCULAR | Status: DC
  Administered 2021-08-25 – 2021-09-02 (×7): 40 mg via SUBCUTANEOUS
  Filled 2021-08-25 (×9): qty 0.4

## 2021-08-25 MED ORDER — OSELTAMIVIR PHOSPHATE 30 MG PO CAPS
30.0000 mg | ORAL_CAPSULE | Freq: Two times a day (BID) | ORAL | Status: DC
Start: 2021-08-26 — End: 2021-08-26
  Filled 2021-08-25: qty 1

## 2021-08-25 MED ORDER — SODIUM CHLORIDE 0.9 % IV SOLN
2.0000 g | INTRAVENOUS | Status: DC
  Administered 2021-08-25: 2 g via INTRAVENOUS
  Filled 2021-08-25: qty 20

## 2021-08-25 MED ORDER — POLYETHYLENE GLYCOL 3350 17 G PO PACK: 17.0000 g | PACK | Freq: Every day | ORAL | Status: DC | PRN

## 2021-08-25 MED ORDER — CHLORHEXIDINE GLUCONATE CLOTH 2 % EX PADS
6.0000 | MEDICATED_PAD | Freq: Every day | CUTANEOUS | Status: DC
  Administered 2021-08-27 – 2021-08-28 (×3): 6 via TOPICAL

## 2021-08-25 MED ORDER — LACTATED RINGERS IV BOLUS
1000.0000 mL | Freq: Once | INTRAVENOUS | Status: AC
  Administered 2021-08-25: 1000 mL via INTRAVENOUS

## 2021-08-25 MED ORDER — ACETAMINOPHEN 325 MG PO TABS
650.0000 mg | ORAL_TABLET | Freq: Once | ORAL | Status: AC
Start: 2021-08-25 — End: 2021-08-25
  Administered 2021-08-25: 650 mg via ORAL
  Filled 2021-08-25: qty 2

## 2021-08-25 MED ORDER — POTASSIUM CHLORIDE CRYS ER 20 MEQ PO TBCR
40.0000 meq | EXTENDED_RELEASE_TABLET | Freq: Once | ORAL | Status: DC
  Filled 2021-08-25: qty 2

## 2021-08-25 MED ORDER — INSULIN ASPART 100 UNIT/ML IJ SOLN
0.0000 [IU] | Freq: Every day | INTRAMUSCULAR | Status: DC
  Filled 2021-08-25: qty 0.05

## 2021-08-25 MED ORDER — SODIUM CHLORIDE 0.9% FLUSH
3.0000 mL | Freq: Two times a day (BID) | INTRAVENOUS | Status: DC
  Administered 2021-08-25 – 2021-09-01 (×12): 3 mL via INTRAVENOUS

## 2021-08-25 MED ORDER — ACETAMINOPHEN 650 MG RE SUPP
650.0000 mg | Freq: Four times a day (QID) | RECTAL | Status: DC | PRN
  Administered 2021-08-26 (×2): 650 mg via RECTAL
  Filled 2021-08-25 (×2): qty 1

## 2021-08-25 NOTE — Sepsis Progress Note (Signed)
Following for sepsis  monitoring. Appropriate fluid resuscitation and antibiotics given. PCCM consultation made for rising lactic acidosis. Continue to monitor.

## 2021-08-25 NOTE — Progress Notes (Signed)
Pharmacy Antibiotic Note  Bryan Farrell is a 71 y.o. male admitted on 08/25/2021 with sepsis due to left leg cellulitis.  Pharmacy has been consulted for vancomycin and cefepime dosing.  Plan: Vancomycin 1750mg  IV q24h for estimated AUC 519 using SCr 1.33 Check vancomycin levels as needed, goal AUC 400-550 Cefepime 2g IV q8h, Flagyl 500mg  IV q12h, Tamiflu Follow up renal function & cultures   Height: 6' (182.9 cm) Weight: 88.5 kg (195 lb 1.7 oz) IBW/kg (Calculated) : 77.6  Temp (24hrs), Avg:102.3 F (39.1 C), Min:102 F (38.9 C), Max:102.6 F (39.2 C)  Recent Labs  Lab 08/25/21 1520 08/25/21 1714 08/25/21 1818  WBC 21.6*  --   --   CREATININE 1.33*  --   --   LATICACIDVEN 1.4 8.3* >9.0*    Estimated Creatinine Clearance: 58.3 mL/min (A) (by C-G formula based on SCr of 1.33 mg/dL (H)).    No Known Allergies  Antimicrobials this admission:  1/12 CTX x 1 1/12 Cefepime >> 1/12 Vanc >> 1/12 Flagyl >> 1/12 Tamiflu >>  Dose adjustments this admission:   Microbiology results:  1/12 BCx: 1/12 UCx: 1/12 Influenza A: POS  Thank you for allowing pharmacy to be a part of this patients care.  Peggyann Juba, PharmD, BCPS Pharmacy: [phone] 08/25/2021 7:38 PM

## 2021-08-25 NOTE — ED Triage Notes (Signed)
Pt BIB EMS from [facility]. Per EMS pt presents with c/o cellulitis on lower left leg. EMS reports left leg is swollen, red and feels hot to touch. Hx of dementia.  ° °HR 110 °BP 140/90 °98% 2LNC °CBG 275 ° °18 G right hand  °200 ml.  °

## 2021-08-25 NOTE — ED Notes (Signed)
Male purewick placed on pt 

## 2021-08-25 NOTE — ED Provider Notes (Signed)
°  Face-to-face evaluation   History: He presents for evaluation of suspected left leg infection.  He is unable to give any history  Physical exam: Elderly, confused, tachypneic.  Left leg swollen, red and cellulitic appearance.  He is verbal but confused.  MDM: Evaluation for  Chief Complaint  Patient presents with   Cellulitis     He presents for evaluation of left leg swelling and redness suspected to be an infection.  He is unable to give any history.  He apparently has dementia and lives in a assisted living facility.  On exam he is alert and responsive.  Left leg is abnormal for appearance of infection.  There are no drainable lesions.  He is febrile and tachypneic.  He is not in respiratory distress.  He does not require advanced airway.  He requires hospitalization for treatment of febrile illness, influenza positive and soft tissue infection suspected to be cellulitis.  Markedly elevated lactate.  He is hypokalemic.  Initial lactate is markedly elevated.  It is not clear if this indicates sepsis or some other reason for elevation of lactate.  He is currently receiving IV fluids and need to have the lactate trended.   Medical screening examination/treatment/procedure(s) were conducted as a shared visit with non-physician practitioner(s) and myself.  I personally evaluated the patient during the encounter    Daleen Bo, MD 08/26/21 1306

## 2021-08-25 NOTE — Progress Notes (Addendum)
eLink Physician-Brief Progress Note Patient Name: Bryan Farrell DOB: [birth year] MRN: [id number]   Date of Service  08/25/2021  HPI/Events of Note  CXR, ABG and lactic acid results reviewed.  eICU Interventions  New Patient Evaluation. LR 1000 ml iv bolus ordered for persistently elevated lactic acid.        Bryan Farrell 08/25/2021, 11:40 PM

## 2021-08-25 NOTE — ED Triage Notes (Signed)
Pt BIB EMS from [facility]. Per EMS pt presents with c/o cellulitis on lower left leg. EMS reports left leg is swollen, red and feels hot to touch. Hx of dementia.   HR 110 BP 140/90 98% 2LNC CBG 275  18 G right hand  200 ml.

## 2021-08-25 NOTE — Consult Note (Signed)
NAME:  Bryan Farrell, MRN:  [id number], DOB:  [birth year], LOS: 0 ADMISSION DATE:  08/25/2021, CONSULTATION DATE:  08/25/21 REFERRING MD:  Alinda Money, CHIEF COMPLAINT:  sepsis   History of Present Illness:  [name] is a 71 year male with PMH significant for dementia residing in assisted living, DM, HTN and hx of gangrene of the left toe who presented to the ED 1/12 with fever and increased redness and warmth of the LLE.  He was unable to give any history secondary to dementia, but was awake and alert with stable blood pressure and fever up to 102F.  Work-up in the ED revealed WBC 21.6, LA 8.3 up-trending to >9, Cr 1.3 above baseline <1.0 and influenza A positive.  ABG 7.45/37/59. He was given 3L IVF and Ceftriaxone and PCCM consulted for increasing lactic acid and concern for severe sepsis.  Pertinent  Medical History   has a past medical history of Diabetes mellitus without complication (HCC), Gangrene (HCC) (05/28/2019), and Hypertension.   Significant Hospital Events: Including procedures, antibiotic start and stop dates in addition to other pertinent events   1/12 presented to the ED with LLE cellulitis and rising lactic acid, PCCM consult. Given Ceftriaxone in the ED, broadened to Vanc/Cefepime  Interim History / Subjective:  As above  Objective   Blood pressure 138/80, pulse 98, temperature (!) 102 F (38.9 C), temperature source Rectal, resp. rate 20, height 6' (1.829 m), weight 88.5 kg, SpO2 99 %.        Intake/Output Summary (Last 24 hours) at 08/25/2021 2014 Last data filed at 08/25/2021 1637 Gross per 24 hour  Intake 2100 ml  Output --  Net 2100 ml   Filed Weights   08/25/21 1507  Weight: 88.5 kg   Physical Exam: General: Chronically ill-appearing, lethargic, will awaken and answer questions, protecting airway HENT: [place], AT, OP clear, MMM Eyes: EOMI, no scleral icterus Respiratory: Bilateral rhonchi.  No wheezing  Cardiovascular: Tachycardic, RR, -M/R/G, no  JVD GI: BS+, soft, nontender Neuro: AAO x3, CNII-XII grossly intact Psych: Normal mood, normal affect GU: Foley in place Extremities: LLE erythema up the upper thigh, warm and tender, s/p amputation of 2nd digit of left foot, 1st digit of right foot. 2nd digit of right foot swollen and dusky         Resolved Hospital Problem list     Assessment & Plan:   Severe sepsis secondary to LLE cellulitis Severe lactic acidosis Concern for developing septic shock in this patient with PMH of gangrene in the same foot and baseline dementia and poor functional status -recommend additional IVF and broaden antibiotics to Cefepime/Vancomycin -order plain films of the LLE to evaluate for subcutaneous gas  -trend lactic acid, ABG for acidosis -completing 4th L IVF, monitor volume status and lactic acid and give further fluids accordingly -follow blood and urine cultures -F/u LE ultrasouns -monitor in step-down, transfer to ICU if pt is clinically worsening   Acute Kidney Injury, hypokalemia Likely pre-renal volume depletion in the setting of fever/sepsis -monitor renal indices after IVF, UOP and electrolytes and renally dose medications -avoid nephrotoxins as able  Influenza A positive  CXR clear, no hypoxia -SpO2 goal >88% -continue Tamiflu  Type 2 DM -SSI and close glucose monitoring  Discussed plan with primary team Pulmonary will continue to follow  Best Practice (right click and "Reselect all SmartList Selections" daily)   Diet/type: Regular consistency (see orders) DVT prophylaxis: LMWH GI prophylaxis: N/A Lines: N/A Foley:  N/A Code Status:  full code Last date of multidisciplinary goals of care discussion [per primary]  Labs   CBC: Recent Labs  Lab 08/25/21 1520  WBC 21.6*  NEUTROABS 19.5*  HGB 13.9  HCT 41.5  MCV 87.9  PLT [id number]    Basic Metabolic Panel: Recent Labs  Lab 08/25/21 1520  NA 137  K 3.3*  CL 101  CO2 27  GLUCOSE 223*  BUN 31*   CREATININE 1.33*  CALCIUM 8.2*  MG 2.0   GFR: Estimated Creatinine Clearance: 58.3 mL/min (A) (by C-G formula based on SCr of 1.33 mg/dL (H)). Recent Labs  Lab 08/25/21 1520 08/25/21 1714 08/25/21 1818  WBC 21.6*  --   --   LATICACIDVEN 1.4 8.3* >9.0*    Liver Function Tests: Recent Labs  Lab 08/25/21 1520  AST 50*  ALT 32  ALKPHOS 78  BILITOT 1.2  PROT 7.6  ALBUMIN 3.0*   No results for input(s): LIPASE, AMYLASE in the last 168 hours. No results for input(s): AMMONIA in the last 168 hours.  ABG    Component Value Date/Time   PHART 7.452 (H) 08/25/2021 1841   PCO2ART 37.3 08/25/2021 1841   PO2ART 59.7 (L) 08/25/2021 1841   HCO3 25.7 08/25/2021 1841   TCO2 27 05/06/2017 1309   O2SAT 91.6 08/25/2021 1841     Coagulation Profile: Recent Labs  Lab 08/25/21 1520  INR 1.1    Cardiac Enzymes: No results for input(s): CKTOTAL, CKMB, CKMBINDEX, TROPONINI in the last 168 hours.  HbA1C: Hgb A1c MFr Bld  Date/Time Value Ref Range Status  05/28/2019 03:57 AM 9.0 (H) 4.8 - 5.6 % Final    Comment:    (NOTE) Pre diabetes:          5.7%-6.4% Diabetes:              >6.4% Glycemic control for   <7.0% adults with diabetes     CBG: No results for input(s): GLUCAP in the last 168 hours.  Review of Systems:   Unable to obtain secondary to dementia  Past Medical History:  He,  has a past medical history of Diabetes mellitus without complication ([place]), Gangrene ([place]) (05/28/2019), and Hypertension.   Surgical History:   Past Surgical History:  Procedure Laterality Date   AMPUTATION Left 05/30/2019   Procedure: LEFT FOOT 2ND RAY AMPUTATION;  Surgeon: [name], MD;  Location: [facility];  Service: Orthopedics;  Laterality: Left;   TOE AMPUTATION Right    TONSILLECTOMY       Social History:   reports that he has never smoked. He has never used smokeless tobacco. He reports that he does not currently use alcohol. He reports that he does not use drugs.    Family History:  His family history includes Heart Problems in his father and mother.   Allergies No Known Allergies   Home Medications  Prior to Admission medications   Medication Sig Start Date End Date Taking? Authorizing Provider  acetaminophen (TYLENOL) 325 MG tablet Take 650 mg by mouth daily.   Yes [provider]  atorvastatin (LIPITOR) 40 MG tablet Take 40 mg by mouth daily. 03/26/20  Yes [provider]  buPROPion (WELLBUTRIN) 75 MG tablet Take 75 mg by mouth 2 (two) times daily.   Yes [provider]  cholecalciferol (VITAMIN D) 25 MCG (1000 UNIT) tablet Take 1,000 Units by mouth daily. 02/12/20  Yes [provider]  divalproex (DEPAKOTE) 125 MG DR tablet Take 125 mg by mouth at bedtime. 08/21/21  Yes [provider]  ertapenem 1 g in sodium chloride 0.9 % 100 mL Inject 1 g into the vein daily. Start date : 08/24/21 for 7 days   Yes [provider]  gabapentin (NEURONTIN) 100 MG capsule Take 1 capsule (100 mg total) by mouth 3 (three) times daily. Patient taking differently: Take 100 mg by mouth at bedtime. 04/16/19  Yes Davonna Belling, MD  insulin glargine (LANTUS) 100 UNIT/ML injection Inject 0.05 mLs (5 Units total) into the skin at bedtime. Patient taking differently: Inject 20 Units into the skin at bedtime. 06/03/19  Yes Christian, Rylee, MD  lidocaine, PF, (XYLOCAINE) 1 % SOLN injection 3.2 mLs by Other route daily. Along with Invanz 08/24/21  Yes [provider]  sertraline (ZOLOFT) 100 MG tablet Take 200 mg by mouth daily. 03/20/20  Yes [provider]  TRULICITY 1.5 0000000 SOPN Inject 1.5 mg into the skin once a week. 04/17/20  Yes [provider]  acetaminophen (TYLENOL) 500 MG tablet Take 1 tablet (500 mg total) by mouth every 6 (six) hours as needed. Patient not taking: Reported on 08/25/2021 04/16/19   Domenic Moras, PA-C  insulin aspart (NOVOLOG) 100 UNIT/ML injection Inject 0-20 Units into the  skin 3 (three) times daily with meals. Patient not taking: Reported on 08/25/2021 06/03/19   Mitzi Hansen, MD     Critical care time: N/A    Rodman Pickle, M.D. [facility] Pulmonary/Critical Care Medicine 08/25/2021 9:21 PM   See Amion for personal pager For hours between 7 PM to 7 AM, please call Elink for urgent questions

## 2021-08-25 NOTE — ED Provider Notes (Signed)
[facility]-EMERGENCY DEPT Provider Note   CSN: [id number] Arrival date & time: 08/25/21  1453     History  Chief Complaint  Patient presents with   Cellulitis   Level 5 Caveat: Dementia.  [facility] is a 71 y.o. male with a past medical history of diabetes, hypertension, who presents to the ED brought in by EMS complaining of cellulitis to the left lower leg.  He has associated increased warmth to the left lower extremity. Pt has not tried any medications for his symptoms.   Pt unable to provide any information on when his symptoms started.   The history is provided by the patient. No language interpreter was used.    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 Authorizing Provider  acetaminophen (TYLENOL) 500 MG tablet Take 1 tablet (500 mg total) by mouth every 6 (six) hours as needed. 04/16/19   Fayrene Helper, PA-C  atorvastatin (LIPITOR) 40 MG tablet Take 40 mg by mouth daily. 03/26/20   [provider]  cholecalciferol (VITAMIN D) 25 MCG (1000 UNIT) tablet Take 1,000 Units by mouth daily. 02/12/20   [provider]  gabapentin (NEURONTIN) 100 MG capsule Take 1 capsule (100 mg total) by mouth 3 (three) times daily. 04/16/19   Benjiman Core, MD  insulin aspart (NOVOLOG) 100 UNIT/ML injection Inject 0-20 Units into the skin 3 (three) times daily with meals. 06/03/19   Elige Radon, MD  insulin glargine (LANTUS) 100 UNIT/ML injection Inject 0.05 mLs (5 Units total) into  the skin at bedtime. 06/03/19   Elige Radon, MD  sertraline (ZOLOFT) 100 MG tablet Take 100 mg by mouth daily. 03/20/20   [provider]  TRULICITY 1.5 MG/0.5ML SOPN SMARTSIG:0.5 Milliliter(s) SUB-Q Once a Week 04/17/20   [provider]      Allergies    Patient has no known allergies.    Review of Systems   Review of Systems  Unable to perform ROS: Dementia   Physical Exam Updated Vital Signs BP 138/80    Pulse 98    Temp (!) 102 F (38.9 C) (Rectal)    Resp 20    Ht 6' (1.829 m)    Wt 88.5 kg    SpO2 99%    BMI 26.46 kg/m  Physical Exam Vitals and nursing note reviewed.  Constitutional:      General: He is not in acute distress.    Appearance: He is not diaphoretic.  HENT:     Head: Normocephalic and atraumatic.     Mouth/Throat:     Pharynx: No oropharyngeal exudate.  Eyes:     General: No scleral icterus.    Conjunctiva/sclera: Conjunctivae normal.  Cardiovascular:     Rate and Rhythm: Normal rate and regular rhythm.     Pulses: Normal pulses.     Heart sounds: Normal heart sounds.  Pulmonary:     Effort: Pulmonary effort is normal. No respiratory distress.     Breath sounds:  Normal breath sounds. No wheezing.  Abdominal:     General: Bowel sounds are normal.     Palpations: Abdomen is soft. There is no mass.     Tenderness: There is no abdominal tenderness. There is no guarding or rebound.  Musculoskeletal:        General: Normal range of motion.     Cervical back: Normal range of motion and neck supple.  Skin:    General: Skin is warm and dry.     Findings: Erythema present.     Comments: Area of erythema noted to left lower extremity with increased warmth to the area.  No obvious sign of drainage.  Mild tenderness to palpation to the area. See pictures below.  Neurological:     Mental Status: He is alert.  Psychiatric:        Behavior: Behavior normal.         ED Results / Procedures / Treatments   Labs (all labs ordered are  listed, but only abnormal results are displayed) Labs Reviewed  RESP PANEL BY RT-PCR (FLU A&B, COVID) ARPGX2 - Abnormal; Notable for the following components:      Result Value   Influenza A by PCR POSITIVE (*)    All other components within normal limits  LACTIC ACID, PLASMA - Abnormal; Notable for the following components:   Lactic Acid, Venous 8.3 (*)    All other components within normal limits  COMPREHENSIVE METABOLIC PANEL - Abnormal; Notable for the following components:   Potassium 3.3 (*)    Glucose, Bld 223 (*)    BUN 31 (*)    Creatinine, Ser 1.33 (*)    Calcium 8.2 (*)    Albumin 3.0 (*)    AST 50 (*)    GFR, Estimated 58 (*)    All other components within normal limits  CBC WITH DIFFERENTIAL/PLATELET - Abnormal; Notable for the following components:   WBC 21.6 (*)    All other components within normal limits  CULTURE, BLOOD (ROUTINE X 2)  CULTURE, BLOOD (ROUTINE X 2)  URINE CULTURE  LACTIC ACID, PLASMA  PROTIME-INR  APTT  MAGNESIUM  URINALYSIS, ROUTINE W REFLEX MICROSCOPIC  BLOOD GAS, ARTERIAL  LACTIC ACID, PLASMA  LACTIC ACID, PLASMA  HIV ANTIBODY (ROUTINE TESTING W REFLEX)  PROTIME-INR  CORTISOL-AM, BLOOD  PROCALCITONIN  COMPREHENSIVE METABOLIC PANEL  CBC    EKG None  Radiology DG Chest Port 1 View  Result Date: 08/25/2021 CLINICAL DATA:  Questionable sepsis - evaluate for abnormality Left leg cellulitis. EXAM: PORTABLE CHEST 1 VIEW COMPARISON:  03/14/2019 FINDINGS: Lung volumes are low. Borderline mild cardiomegaly. The cardiomediastinal contours are normal. Streaky atelectasis at the left lung base. Pulmonary vasculature is normal. No consolidation, pleural effusion, or pneumothorax. No acute osseous abnormalities are seen. IMPRESSION: 1. Low lung volumes with left basilar atelectasis. 2. Borderline mild cardiomegaly without evidence of pulmonary edema. Electronically Signed   By: Narda Rutherford M.D.   On: 08/25/2021 16:24    Procedures .Critical  Care E&M Performed by: Karenann Cai, PA-C  Critical care provider statement:    Critical care time (minutes):  60   Critical care time was exclusive of:  Separately billable procedures and treating other patients   Critical care was necessary to treat or prevent imminent or life-threatening deterioration of the following conditions:  Sepsis   Critical care was time spent personally by me on the following activities:  Development of treatment plan with patient or surrogate, discussions with consultants, evaluation of  patient's response to treatment, examination of patient, pulse oximetry, re-evaluation of patient's condition, review of old charts, ordering and review of radiographic studies, ordering and review of laboratory studies and ordering and performing treatments and interventions   I assumed direction of critical care for this patient from another provider in my specialty: no     Care discussed with: admitting provider   After initial E/M assessment, critical care services were subsequently performed that were exclusive of separately billable procedures or treatment.      Medications Ordered in ED Medications  lactated ringers infusion ( Intravenous New Bag/Given 08/25/21 1833)  cefTRIAXone (ROCEPHIN) 2 g in sodium chloride 0.9 % 100 mL IVPB (0 g Intravenous Stopped 08/25/21 1617)  acetaminophen (TYLENOL) 325 MG tablet (  Not Given 08/25/21 1612)  potassium chloride SA (KLOR-CON M) CR tablet 40 mEq (40 mEq Oral Patient Refused/Not Given 08/25/21 1817)  enoxaparin (LOVENOX) injection 40 mg (has no administration in time range)  sodium chloride flush (NS) 0.9 % injection 3 mL (has no administration in time range)  acetaminophen (TYLENOL) tablet 650 mg (has no administration in time range)    Or  acetaminophen (TYLENOL) suppository 650 mg (has no administration in time range)  polyethylene glycol (MIRALAX / GLYCOLAX) packet 17 g (has no administration in time range)  ondansetron  (ZOFRAN) tablet 4 mg (has no administration in time range)    Or  ondansetron (ZOFRAN) injection 4 mg (has no administration in time range)  lactated ringers bolus 1,000 mL (0 mLs Intravenous Stopped 08/25/21 1617)    And  lactated ringers bolus 1,000 mL (0 mLs Intravenous Stopped 08/25/21 1637)    And  lactated ringers bolus 1,000 mL (1,000 mLs Intravenous Bolus 08/25/21 1631)  acetaminophen (TYLENOL) tablet 650 mg ( Oral Canceled Entry 08/25/21 1602)    ED Course/ Medical Decision Making/ A&P Clinical Course as of 08/25/21 1841  Thu Aug 25, 2021  1519 Case discussed with attending who agrees with sepsis work-up. [SB]  1623 Tachycardia improved with fluid bolus. [SB]  1623 WBC(!): 21.6 [SB]  1623 Influenza A By PCR(!): POSITIVE [SB]  1635 Consult with hospitalist, Dr. Alinda MoneyMelvin who agrees to admission at this time. [SB]  1739 Attending in to evaluate patient who agrees with repleting potassium, ABG, recheck temp and repeat tylenol potentially.  [SB]  1750 Lactic Acid, Venous(!!): 8.3 Notified by charge RN of elevated lactic. [SB]  1804 [facility] note MD concerned for DVT and sepsis. LLE swelling/redness noted yesterday without drainage. Pt has a history of dementia. Pt was given tylenol, however, he spit it out. [SB]  1810 Pt re-evaluated and asleep on stretcher, [place] placed back in place with O2 of 96% on 2L [SB]    Clinical Course User Index [SB] Arrietty Dercole A, PA-C                           Medical Decision Making  Patient presents to the ED with area of cellulitis noted to the left lower extremity.  No recent injury, trauma, bug bite.  Patient noticed with started after his toe amputation.  Vital signs, patient tachycardic, tachypneic, febrile at 102.6. On exam patient with area of nonpurulent cellulitis noted to left lower extremity, increased warmth to the area, and mild tenderness to palpation. Otherwise no acute cardiovascular,  respiratory, abdominal exam findings. Differential diagnosis includes cellulitis, abscess, sepsis, UTI.   Additional history obtained:  [facility]  Discussion with nursing facility who noted that they became aware of the left lower extremity swelling/redness on yesterday.  They deny drainage.  The doctor at the facility was concerned for DVT and sepsis which prompted ED visit today.  Patient was given Tylenol at the facility prior to being brought into the ED however per nurse at the facility patient spit it out.    EKG: Sinus rhythm, no acute ST/T changes. No change from previous EKG.   Labs:  I ordered, and personally interpreted labs.  The pertinent results include: CBC with elevated WBC at 21.6 otherwise unremarkable. Lactic unremarkable at 1.4. COVID and flu swab, positive for influenza A. CMP with decreased potassium at 3.3, BUN slightly elevated at 31, creatinine slightly elevated 1.33. Urinalysis ordered and pending. Blood cultures ordered and pending Coags ordered and unremarkable.  No recent lab values to compare to, most recent labs greater than 2 years ago.  Imaging: I ordered imaging studies including CXR  I independently visualized and interpreted imaging which showed cardiomegaly without infiltrate. I agree with the radiologist interpretation  Medications:  I ordered medication including LR bolus, Rocephin, Tylenol for sepsis work-up Reevaluation of the patient after these medicines showed that the patient improved.  Patient tachycardia improved, tachypneic improved, respirations improved.  Temperature decreased to 102. I have reviewed the patients home medicines and have made adjustments as needed  Reevaluation: After the interventions noted above, I reevaluated the patient and found that they have :stayed the same  Cardiac Monitoring: The patient was maintained on a cardiac monitor.  I personally viewed and  interpreted the cardiac monitored which showed an underlying rhythm of: sinus rhythm.   Critical Interventions: IVF and Rocephin.  Consultations: I requested consultation with the hospitalist, Dr. Alinda MoneyMelvin,  and discussed lab and imaging findings as well as pertinent plan - they recommend: admission at this time  Disposition: Patient presentation suspicious for sepsis in the setting of cellulitis to left lower extremity.  Doubt abscess at this time.  Patient presentation could be due to vascular concern. After consideration of the diagnostic results and the patients response to treatment, I feel that the patent would benefit from admission to the hospital for observation, further work-up/management.  Case discussed with attending who agrees with plan at this time.  This chart was dictated using voice recognition software, Dragon. Despite the best efforts of this provider to proofread and correct errors, errors may still occur which can change documentation meaning.  Final Clinical Impression(s) / ED Diagnoses Final diagnoses:  Cellulitis of left lower extremity    Rx / DC Orders ED Discharge Orders     None         Deairra Halleck A, PA-C 08/26/21 0011    Mancel BaleWentz, Elliott, MD 08/26/21 1306

## 2021-08-25 NOTE — ED Notes (Addendum)
Pt spit out potassium tablets after trying to swallow them. I offered for him to try and take it again and he refused.

## 2021-08-25 NOTE — Progress Notes (Signed)
Elink following sepsis bundle. °

## 2021-08-25 NOTE — H&P (Addendum)
History and Physical   Zaki Gertsch HRC:[id number] DOB: 1950/10/06 DOA: 08/25/2021  PCP: Eloisa Northern, MD   Patient coming from: [facility]  Chief Complaint: Left leg redness concern for cellulitis  HPI: Bryan Farrell is a 71 y.o. male with medical history significant of vascular dementia, diabetes, history of left toe osteo and amputation, adjustment disorder, depression, history of being unhoused who presents from nursing facility with concerns about redness and swelling of left lower extremity.  Due to patient significant dementia history obtained with assistance of chart review.  EDP spoke with his facility and stated that they have had concern for his left lower extremity for the past day or so noticing redness and swelling.  Sent him to the ED to rule out infection versus DVT.  Has not received any treatment at the facility.  Patient denies fevers, chills, chest pain, shortness of breath, abdominal pain, constipation, diarrhea, nausea, vomiting.  He is alert and oriented x3.  ED Course: Vital signs in the ED significant for fever to 102.6, respiratory rate in the 20s, on 2 L but maintaining good saturations.  Lab work-up showed CMP with potassium 3.3, creatinine elevated 1.33 from baseline of 0.8, glucose 223, calcium 8.2, albumin 3.0, AST 50.  CBC with leukocytosis to 21..  PT, PTT, INR all within normal limits.  Lactic acid normal on first check at 1.4, recheck at 8.3 (suspect abnormality), respiratory panel positive for flu A.  Urinalysis and urine cultures pending.  Blood cultures pending.  Magnesium level normal.  Chest x-ray showed low lung volumes with left basilar atelectasis and borderline cardiomegaly.  Patient received ceftriaxone, IV fluids, p.o. potassium and Tylenol in the ED.  Review of Systems: As per HPI otherwise all other systems reviewed and are negative.  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 He has never used smokeless tobacco. He reports that he does not currently use alcohol. He reports that he does not use drugs.  No Known Allergies  Family History  Problem Relation Age of Onset   Heart Problems Mother    Heart Problems Father   Reviewed on admission  Prior to Admission medications   Medication Sig Start Date End Date Taking? Authorizing Provider  acetaminophen (TYLENOL) 500 MG tablet Take 1 tablet (500 mg total) by mouth every 6 (six) hours as needed. 04/16/19   Fayrene Helper, PA-C  atorvastatin (LIPITOR) 40 MG tablet Take 40 mg by mouth daily. 03/26/20   [provider]  cholecalciferol (VITAMIN D) 25 MCG (1000 UNIT) tablet Take 1,000 Units by mouth daily. 02/12/20   [provider]  gabapentin (NEURONTIN) 100 MG capsule Take 1 capsule (100 mg total) by mouth 3 (three) times daily. 04/16/19   Benjiman Core, MD  insulin aspart (NOVOLOG) 100 UNIT/ML injection Inject 0-20 Units into the skin 3 (three) times daily with meals. 06/03/19   Elige Radon, MD  insulin glargine (LANTUS) 100 UNIT/ML injection Inject 0.05 mLs (5 Units total) into the skin at bedtime. 06/03/19  Christian, Rylee, MD  sertraline (ZOLOFT) 100 MG tablet Take 100 mg by mouth daily. 03/20/20   [provider]  TRULICITY 1.5 MG/0.5ML SOPN SMARTSIG:0.5 Milliliter(s) SUB-Q Once a Week 04/17/20   [provider]    Physical Exam: Vitals:   08/25/21 1700 08/25/21 1730 08/25/21 1800 08/25/21 1833  BP: (!) 136/95 (!) 143/87 140/85 138/80  Pulse: 93 89 86 98  Resp: (!) 23 (!) 21 (!) 23 20  Temp: (!) 102 F  (38.9 C)     TempSrc: Rectal     SpO2: 97% 96% 95% 99%  Weight:      Height:       Physical Exam Constitutional:      General: He is not in acute distress.    Appearance: Normal appearance.     Comments: Uncomfortable appearing elderly male.  HENT:     Head: Normocephalic and atraumatic.     Mouth/Throat:     Mouth: Mucous membranes are moist.     Pharynx: Oropharynx is clear.  Eyes:     Extraocular Movements: Extraocular movements intact.     Pupils: Pupils are equal, round, and reactive to light.  Cardiovascular:     Rate and Rhythm: Normal rate and regular rhythm.     Pulses: Normal pulses.     Heart sounds: Normal heart sounds.  Pulmonary:     Effort: Pulmonary effort is normal. No respiratory distress.     Breath sounds: Normal breath sounds.     Comments: Transmitted upper airway sounds noted. Abdominal:     General: Bowel sounds are normal. There is no distension.     Palpations: Abdomen is soft.     Tenderness: There is no abdominal tenderness.  Musculoskeletal:        General: Swelling and tenderness present. No deformity.     Comments: Left lower extremity with warmth, erythema and swelling.  Is status post amputation of his second toe.    Skin:    General: Skin is warm and dry.     Findings: Erythema present.  Neurological:     General: No focal deficit present.     Mental Status: He is oriented to person, place, and time. Mental status is at baseline.   Labs on Admission: I have personally reviewed following labs and imaging studies  CBC: Recent Labs  Lab 08/25/21 1520  WBC 21.6*  NEUTROABS 19.5*  HGB 13.9  HCT 41.5  MCV 87.9  PLT 281    Basic Metabolic Panel: Recent Labs  Lab 08/25/21 1520  NA 137  K 3.3*  CL 101  CO2 27  GLUCOSE 223*  BUN 31*  CREATININE 1.33*  CALCIUM 8.2*  MG 2.0    GFR: Estimated Creatinine Clearance: 58.3 mL/min (A) (by C-G formula based on SCr of 1.33 mg/dL (H)).  Liver Function Tests: Recent Labs  Lab  08/25/21 1520  AST 50*  ALT 32  ALKPHOS 78  BILITOT 1.2  PROT 7.6  ALBUMIN 3.0*    Urine analysis:    Component Value Date/Time   COLORURINE YELLOW 05/27/2019 1336   APPEARANCEUR CLEAR 05/27/2019 1336   LABSPEC 1.026 05/27/2019 1336   PHURINE 5.0 05/27/2019 1336   GLUCOSEU >=500 (A) 05/27/2019 1336   HGBUR NEGATIVE 05/27/2019 1336   BILIRUBINUR NEGATIVE 05/27/2019 1336   KETONESUR NEGATIVE 05/27/2019 1336   PROTEINUR NEGATIVE 05/27/2019 1336   UROBILINOGEN 1.0 04/20/2010 1105   NITRITE NEGATIVE 05/27/2019 1336   LEUKOCYTESUR NEGATIVE 05/27/2019 1336    Radiological Exams  on Admission: DG Chest Port 1 View  Result Date: 08/25/2021 CLINICAL DATA:  Questionable sepsis - evaluate for abnormality Left leg cellulitis. EXAM: PORTABLE CHEST 1 VIEW COMPARISON:  03/14/2019 FINDINGS: Lung volumes are low. Borderline mild cardiomegaly. The cardiomediastinal contours are normal. Streaky atelectasis at the left lung base. Pulmonary vasculature is normal. No consolidation, pleural effusion, or pneumothorax. No acute osseous abnormalities are seen. IMPRESSION: 1. Low lung volumes with left basilar atelectasis. 2. Borderline mild cardiomegaly without evidence of pulmonary edema. Electronically Signed   By: Narda RutherfordMelanie  Sanford M.D.   On: 08/25/2021 16:24    EKG: Independently reviewed.  Sinus tachycardia at 101 bpm.  Low voltage noted.  Some baseline wander.  Assessment/Plan Principal Problem:   Sepsis due to cellulitis [facility](HCC) Active Problems:   Vascular dementia (HCC)   Diabetes (HCC)  Sepsis secondary to cellulitis > Patient meets sepsis criteria with fever to 102.6, respiratory rate in the 20s, leukocytosis to 21.6.  Source of cellulitis with erythema, warmth, and swelling of left lower extremity. > Started on ceftriaxone in the ED.  Received 3 L bolus and started on a rate of fluids in the ED as well. > Initial lactic acid was normal repeat was obtained which was elevated to 8.3, currently  suspect this to be a lab abnormality or issue with sample transport, will continue to follow lactic acid level.  Normal blood pressure patient appears otherwise stable. > Chest x-ray was showing only low lung volumes and atelectasis and borderline cardiomegaly, no infiltrates. > Unclear to what extent flu could be contributing to fever but certainly is not contributing to his white count. - We will monitor in progressive for now as we will results of lactic acid and considering his sepsis and inability to communicate. - Continue with ceftriaxone - Follow-up urinalysis, urine cultures, blood cultures - Trend fever curve and white count - As needed Tylenol ADDENDUM > Repeat lactic acid remains elevated at greater than 9.  Case discussed with critical care who will consult and patient will be placed in the stepdown unit with TR[facility] remaining primary for now.  Critical care willing to take over as primary if his condition further deteriorates. > Initial ABG without evidence of acidemia with pH 7.452 and normal PCO2. > Hopefully this is early sepsis and we are catching his lactic acid elevation on initial uptrend and will begin to downtrend soon. - We will give additional fluid bolus given good renal function and oxygenation - Trend lactic acid every 4 hours - Repeat ABG in 4 hours - Broaden antibiotics to vancomycin, cefepime, Flagyl for now as we await blood culture results - Prior vascular disease in the left lower extremity noted on chart review.  Leg currently pillars warm well perfused however will order duplex ultrasound of the lower extremity in addition to venous ultrasound for completeness. - Appreciate critical care recommendations  AKI Hypokalemia > Creatinine elevated to 1.33 from baseline around 0.8.  Potassium noted to be 3.3.  Magnesium normal. > Per notes are perfused 40 mEq p.o. tablets of potassium in the ED  - Follow-up response to IV fluids as above - Trend renal function  electrolytes - Avoid nephrotoxic agents - We will switch to 40 mEq to packet/liquid form   Influenza A > Noted to be flu positive in the ED.  Unclear timeline of symptoms. - Tamiflu - Supportive care  Diabetes - SSI  Vascular dementia > Unable to provide reliable history due to his dementia. - Continue to monitor -  We will have pharmacy work to confirm his medications as he did not come with paperwork   History of adjustment disorder Depression - Continue home sertraline if able to be confirmed by pharmacy  DVT prophylaxis: Lovenox  Code Status:   Full   Family Communication:  Son, Corey Laski, updated by phone. Disposition Plan:   Patient is from:  [facility]  Anticipated DC to:  Same as above  Anticipated DC date:  2 to 5 days  Anticipated DC barriers: None  Consults called:  None  Admission status:  Inpatient, progressive  Severity of Illness: The appropriate patient status for this patient is INPATIENT. Inpatient status is judged to be reasonable and necessary in order to provide the required intensity of service to ensure the patient's safety. The patient's presenting symptoms, physical exam findings, and initial radiographic and laboratory data in the context of their chronic comorbidities is felt to place them at high risk for further clinical deterioration. Furthermore, it is not anticipated that the patient will be medically stable for discharge from the hospital within 2 midnights of admission.   * I certify that at the point of admission it is my clinical judgment that the patient will require inpatient hospital care spanning beyond 2 midnights from the point of admission due to high intensity of service, high risk for further deterioration and high frequency of surveillance required.Synetta Fail MD [facility]  How to contact the [facility] Attending or Consulting provider 7A - 7P or covering provider during after hours 7P -7A, for this  patient?   Check the care team in [facility] and look for a) attending/consulting [facility] provider listed and b) the [facility] team listed Log into [email or web address] and use [place]'s universal password to access. If you do not have the password, please contact the hospital operator. Locate the [facility] provider you are looking for under [facility] and page to a number that you can be directly reached. If you still have difficulty reaching the provider, please page the [facility] (Director on Call) for the Hospitalists listed on amion for assistance.  08/25/2021, 7:38 PM

## 2021-08-25 NOTE — ED Notes (Signed)
Lactic 8.3 Reported to [facility], S. PA

## 2021-08-25 NOTE — Progress Notes (Signed)
Notified Lab that ABG being sent for analysis. 

## 2021-08-26 ENCOUNTER — Inpatient Hospital Stay (HOSPITAL_COMMUNITY): Payer: Medicare Other

## 2021-08-26 DIAGNOSIS — I959 Hypotension, unspecified: Secondary | ICD-10-CM

## 2021-08-26 DIAGNOSIS — R6520 Severe sepsis without septic shock: Secondary | ICD-10-CM

## 2021-08-26 DIAGNOSIS — D72825 Bandemia: Secondary | ICD-10-CM

## 2021-08-26 DIAGNOSIS — L03116 Cellulitis of left lower limb: Secondary | ICD-10-CM

## 2021-08-26 DIAGNOSIS — A419 Sepsis, unspecified organism: Secondary | ICD-10-CM | POA: Diagnosis not present

## 2021-08-26 DIAGNOSIS — R609 Edema, unspecified: Secondary | ICD-10-CM

## 2021-08-26 DIAGNOSIS — E872 Acidosis, unspecified: Secondary | ICD-10-CM

## 2021-08-26 DIAGNOSIS — J101 Influenza due to other identified influenza virus with other respiratory manifestations: Secondary | ICD-10-CM

## 2021-08-26 DIAGNOSIS — D649 Anemia, unspecified: Secondary | ICD-10-CM

## 2021-08-26 DIAGNOSIS — F01C18 Vascular dementia, severe, with other behavioral disturbance: Secondary | ICD-10-CM

## 2021-08-26 DIAGNOSIS — Z7189 Other specified counseling: Secondary | ICD-10-CM

## 2021-08-26 DIAGNOSIS — L0390 Cellulitis, unspecified: Secondary | ICD-10-CM | POA: Diagnosis not present

## 2021-08-26 DIAGNOSIS — L02416 Cutaneous abscess of left lower limb: Secondary | ICD-10-CM

## 2021-08-26 LAB — COMPREHENSIVE METABOLIC PANEL
ALT: 25 U/L (ref 0–44)
AST: 40 U/L (ref 15–41)
Albumin: 2.1 g/dL — ABNORMAL LOW (ref 3.5–5.0)
Alkaline Phosphatase: 60 U/L (ref 38–126)
Anion gap: 10 (ref 5–15)
BUN: 25 mg/dL — ABNORMAL HIGH (ref 8–23)
CO2: 25 mmol/L (ref 22–32)
Calcium: 7.6 mg/dL — ABNORMAL LOW (ref 8.9–10.3)
Chloride: 101 mmol/L (ref 98–111)
Creatinine, Ser: 1.02 mg/dL (ref 0.61–1.24)
GFR, Estimated: >60 mL/min (ref >60)
Glucose, Bld: 165 mg/dL — ABNORMAL HIGH (ref 70–99)
Potassium: 3.1 mmol/L — ABNORMAL LOW (ref 3.5–5.1)
Sodium: 136 mmol/L (ref 135–145)
Total Bilirubin: 0.9 mg/dL (ref 0.3–1.2)
Total Protein: 5.5 g/dL — ABNORMAL LOW (ref 6.5–8.1)

## 2021-08-26 LAB — CBC
HCT: 32.8 % — ABNORMAL LOW (ref 39.0–52.0)
Hemoglobin: 11 g/dL — ABNORMAL LOW (ref 13.0–17.0)
MCH: 29.6 pg (ref 26.0–34.0)
MCHC: 33.5 g/dL (ref 30.0–36.0)
MCV: 88.4 fL (ref 80.0–100.0)
Platelets: 210 10*3/uL (ref 150–400)
RBC: 3.71 MIL/uL — ABNORMAL LOW (ref 4.22–5.81)
RDW: 13.3 % (ref 11.5–15.5)
WBC: 16.8 10*3/uL — ABNORMAL HIGH (ref 4.0–10.5)
nRBC: 0 % (ref 0.0–0.2)

## 2021-08-26 LAB — MAGNESIUM: Magnesium: 1.7 mg/dL (ref 1.7–2.4)

## 2021-08-26 LAB — GLUCOSE, CAPILLARY
Glucose-Capillary: 151 mg/dL — ABNORMAL HIGH (ref 70–99)
Glucose-Capillary: 119 mg/dL — ABNORMAL HIGH (ref 70–99)
Glucose-Capillary: 118 mg/dL — ABNORMAL HIGH (ref 70–99)
Glucose-Capillary: 118 mg/dL — ABNORMAL HIGH (ref 70–99)

## 2021-08-26 LAB — PROTIME-INR
INR: 1.3 — ABNORMAL HIGH (ref 0.8–1.2)
Prothrombin Time: 15.8 seconds — ABNORMAL HIGH (ref 11.4–15.2)

## 2021-08-26 LAB — URINE CULTURE: Culture: NO GROWTH

## 2021-08-26 LAB — CORTISOL-AM, BLOOD: Cortisol - AM: 24.4 ug/dL — ABNORMAL HIGH (ref 6.7–22.6)

## 2021-08-26 LAB — LACTIC ACID, PLASMA
Lactic Acid, Venous: 1.4 mmol/L (ref 0.5–1.9)
Lactic Acid, Venous: 1.2 mmol/L (ref 0.5–1.9)

## 2021-08-26 LAB — PROCALCITONIN: Procalcitonin: 5.29 ng/mL

## 2021-08-26 LAB — MRSA NEXT GEN BY PCR, NASAL: MRSA by PCR Next Gen: NOT DETECTED

## 2021-08-26 MED ORDER — HYDROCERIN EX CREA
TOPICAL_CREAM | Freq: Two times a day (BID) | CUTANEOUS | Status: DC
  Filled 2021-08-26: qty 113

## 2021-08-26 MED ORDER — OSELTAMIVIR PHOSPHATE 75 MG PO CAPS: 75.0000 mg | ORAL_CAPSULE | Freq: Two times a day (BID) | ORAL | Status: DC

## 2021-08-26 MED ORDER — ATORVASTATIN CALCIUM 40 MG PO TABS
40.0000 mg | ORAL_TABLET | Freq: Every day | ORAL | Status: DC
  Administered 2021-08-27 – 2021-09-02 (×5): 40 mg via ORAL
  Filled 2021-08-26 (×7): qty 1

## 2021-08-26 MED ORDER — ADULT MULTIVITAMIN W/MINERALS CH
1.0000 | ORAL_TABLET | Freq: Every day | ORAL | Status: DC
  Administered 2021-08-27 – 2021-09-02 (×5): 1 via ORAL
  Filled 2021-08-26 (×7): qty 1

## 2021-08-26 MED ORDER — CHLORHEXIDINE GLUCONATE 0.12 % MT SOLN
15.0000 mL | Freq: Two times a day (BID) | OROMUCOSAL | Status: DC
  Administered 2021-08-27 – 2021-09-02 (×8): 15 mL via OROMUCOSAL
  Filled 2021-08-26 (×14): qty 15

## 2021-08-26 MED ORDER — SERTRALINE HCL 100 MG PO TABS
200.0000 mg | ORAL_TABLET | Freq: Every day | ORAL | Status: DC
  Administered 2021-08-27 – 2021-09-02 (×5): 200 mg via ORAL
  Filled 2021-08-26 (×7): qty 2

## 2021-08-26 MED ORDER — BUPROPION HCL 75 MG PO TABS
75.0000 mg | ORAL_TABLET | Freq: Two times a day (BID) | ORAL | Status: DC
  Administered 2021-08-27 – 2021-09-02 (×10): 75 mg via ORAL
  Filled 2021-08-26 (×16): qty 1

## 2021-08-26 MED ORDER — SODIUM CHLORIDE 0.9 % IV BOLUS
500.0000 mL | Freq: Once | INTRAVENOUS | Status: AC
  Administered 2021-08-26: 500 mL via INTRAVENOUS

## 2021-08-26 MED ORDER — POTASSIUM CHLORIDE IN NACL 20-0.9 MEQ/L-% IV SOLN
INTRAVENOUS | Status: DC
  Filled 2021-08-26 (×4): qty 1000

## 2021-08-26 MED ORDER — POTASSIUM CHLORIDE 10 MEQ/100ML IV SOLN
10.0000 meq | INTRAVENOUS | Status: AC
  Administered 2021-08-26 (×6): 10 meq via INTRAVENOUS
  Filled 2021-08-26 (×6): qty 100

## 2021-08-26 MED ORDER — DIVALPROEX SODIUM 125 MG PO DR TAB
125.0000 mg | DELAYED_RELEASE_TABLET | Freq: Every day | ORAL | Status: DC
  Administered 2021-08-27 – 2021-08-29 (×3): 125 mg via ORAL
  Filled 2021-08-26 (×5): qty 1

## 2021-08-26 MED ORDER — ORAL CARE MOUTH RINSE
15.0000 mL | Freq: Two times a day (BID) | OROMUCOSAL | Status: DC
  Administered 2021-08-27 – 2021-09-01 (×5): 15 mL via OROMUCOSAL

## 2021-08-26 MED ORDER — MIDODRINE HCL 5 MG PO TABS
5.0000 mg | ORAL_TABLET | Freq: Three times a day (TID) | ORAL | Status: DC
  Administered 2021-08-27 – 2021-09-02 (×11): 5 mg via ORAL
  Filled 2021-08-26 (×14): qty 1

## 2021-08-26 MED ORDER — FENTANYL CITRATE (PF) 100 MCG/2ML IJ SOLN
12.5000 ug | INTRAMUSCULAR | Status: DC | PRN
  Administered 2021-08-26 – 2021-08-27 (×8): 12.5 ug via INTRAVENOUS
  Filled 2021-08-26 (×9): qty 2

## 2021-08-26 MED ORDER — PIPERACILLIN-TAZOBACTAM 3.375 G IVPB
3.3750 g | Freq: Three times a day (TID) | INTRAVENOUS | Status: DC
  Administered 2021-08-26 – 2021-09-01 (×16): 3.375 g via INTRAVENOUS
  Filled 2021-08-26 (×16): qty 50

## 2021-08-26 MED ORDER — GABAPENTIN 100 MG PO CAPS
100.0000 mg | ORAL_CAPSULE | Freq: Every day | ORAL | Status: DC
  Administered 2021-08-27 – 2021-08-28 (×2): 100 mg via ORAL
  Filled 2021-08-26 (×2): qty 1

## 2021-08-26 MED ORDER — VANCOMYCIN HCL 1250 MG/250ML IV SOLN
1250.0000 mg | Freq: Two times a day (BID) | INTRAVENOUS | Status: DC
  Administered 2021-08-26 – 2021-08-28 (×4): 1250 mg via INTRAVENOUS
  Filled 2021-08-26 (×7): qty 250

## 2021-08-26 MED ORDER — GERHARDT'S BUTT CREAM
TOPICAL_CREAM | Freq: Three times a day (TID) | CUTANEOUS | Status: DC
Start: 2021-08-26 — End: 2021-09-03
  Filled 2021-08-26: qty 1

## 2021-08-26 MED ORDER — OSELTAMIVIR PHOSPHATE 75 MG PO CAPS
75.0000 mg | ORAL_CAPSULE | Freq: Two times a day (BID) | ORAL | Status: AC
  Administered 2021-08-27 – 2021-08-29 (×6): 75 mg via ORAL
  Filled 2021-08-26 (×10): qty 1

## 2021-08-26 MED ORDER — ENSURE ENLIVE PO LIQD
237.0000 mL | Freq: Two times a day (BID) | ORAL | Status: DC
  Administered 2021-08-27 – 2021-09-01 (×6): 237 mL via ORAL

## 2021-08-26 MED ORDER — MAGNESIUM SULFATE 2 GM/50ML IV SOLN
2.0000 g | Freq: Once | INTRAVENOUS | Status: AC
  Administered 2021-08-26: 2 g via INTRAVENOUS
  Filled 2021-08-26: qty 50

## 2021-08-26 MED ORDER — OXYCODONE HCL 5 MG PO TABS
5.0000 mg | ORAL_TABLET | Freq: Three times a day (TID) | ORAL | Status: DC | PRN
  Administered 2021-08-28 – 2021-08-29 (×2): 5 mg via ORAL
  Filled 2021-08-26 (×2): qty 1

## 2021-08-26 NOTE — Progress Notes (Signed)
Initial Nutrition Assessment  DOCUMENTATION CODES:   Not applicable  INTERVENTION:  - will order Ensure Enlive BID, each supplement provides 350 kcal and 20 grams of protein. - will order 1 tablet multivitamin with minerals/day. - complete NFPE when feasible.    NUTRITION DIAGNOSIS:   Increased nutrient needs related to acute illness (sepsis) as evidenced by estimated needs.  GOAL:   Patient will meet greater than or equal to 90% of their needs  MONITOR:   PO intake, Supplement acceptance, Labs, Weight trends  REASON FOR ASSESSMENT:   Malnutrition Screening Tool  ASSESSMENT:   71 y.o. male with medical history of vascular dementia, DM, history of L toe osteomyelitis and amputation, and adjustment disorder. He presented to the ED from nursing facility d/t concern surrounding redness and swelling to LLE. In the ED he was noted to be a/o x3.  Patient discussed in rounds this AM and with RN one-on-one at bedside. No visitors present at the time of RD visit. Patient took a few sips of water for RN and did have coughing during that time. He has not had anything else PO today. He is noted to be a/o to self only. RN reports that patient becomes very agitated when touched.   Weight yesterday was documented as 195 lb at 1507 (appears copied forward from last recorded weight, on 05/30/2019) and as 207 lb at 2345. Second weight suspected to be more accurate. Mild LLE edema documented in the edema section of flow sheet.   Per notes: - severe sepsis 2/2 LLE cellulitis - AKI - influenza A positive - dementia and a ALF resident with poor functional status  Labs reviewed; CBGs: 151 and 119 mg/dl, K: 3.1 mmol/l, BUN: 25 mg/dl, Ca: 7.6 mg/dl.  Medications reviewed; sliding scale novolog, 2 g IV Mg sulfate x1 run 1/13, 75 mg tamiflu BID.     NUTRITION - FOCUSED PHYSICAL EXAM:  Deferred; RN report in rounds and in one-on-one discussion at bedside reports patient becomes agitated and can  become combative when touched.   Diet Order:   Diet Order             Diet heart healthy/carb modified Room service appropriate? Yes; Fluid consistency: Thin  Diet effective now                   EDUCATION NEEDS:   Not appropriate for education at this time  Skin:  Skin Assessment: Reviewed RN Assessment  Last BM:  1/13 (type 6 x1, small amount)  Height:   Ht Readings from Last 1 Encounters:  08/25/21 6' (1.829 m)    Weight:   Wt Readings from Last 1 Encounters:  08/25/21 94 kg     Estimated Nutritional Needs:  Kcal:  2000-2200 kcal Protein:  100-115 grams Fluid:  >/= 2.2 L/day      Trenton Gammon, MS, RD, LDN Inpatient Clinical Dietitian RD pager # available in AMION  After hours/weekend pager # available in [facility]

## 2021-08-26 NOTE — Progress Notes (Signed)
PROGRESS NOTE  Bryan Farrell ZOX:[id number] DOB: 05-22-1951   PCP: Garwin Brothers, MD  Patient is from: [facility].  DOA: 08/25/2021 LOS: 1  Chief complaints:  Chief Complaint  Patient presents with   Cellulitis     Brief Narrative / Interim history: 71 year old M with PMH of vascular dementia, DM-2, remote left great toe osteo s/p amputation and depression brought to ED from nursing home with concern for left lower extremity redness and swelling concerning for cellulitis, and admitted for severe sepsis due to left lower extremity cellulitis.  He was febrile to 102.6.  Leukocytosis to 26.  Lactic acid trended from 1.4 to 8.3 and then 9.  Cultures obtained.  Received IV fluids per sepsis protocol.  Started on broad-spectrum antibiotics.  PCCM consulted and admitted to stepdown unit.  Also tested positive for influenza A.  Subjective: Seen and examined earlier this morning.  Patient is sleepy but wakes to voice.  Not forthcoming.  Feels like "shit".  States "feet" when asked if he is hurting.  States "whatever" when asked for permission to examine him.  Seems to be moving all extremities.  Does not appear to be in distress.  Objective: Vitals:   08/26/21 1000 08/26/21 1100 08/26/21 1105 08/26/21 1200  BP: 117/69 (!) 110/58  115/76  Pulse:  79  84  Resp: (!) 24 (!) 28  (!) 25  Temp:   99.9 F (37.7 C)   TempSrc:   Oral   SpO2:  96%  94%  Weight:      Height:        Examination:  GENERAL: No apparent distress.  Nontoxic. HEENT: MMM.  Vision and hearing grossly intact.  NECK: Supple.  No apparent JVD.  RESP: 94% on 2 L.  No IWOB.  Fair aeration bilaterally. CVS:  RRR. Heart sounds normal.  ABD/GI/GU: BS+. Abd soft, NTND.  MSK/EXT:  Moves extremities. S/p left great toe amputation.  Difficult to palpate pulse. SKIN: Significant LLE erythema, swelling, tenderness and warmth to touch all the way to his knees.  Does not seem to be spreading.  Some discoloration of right second  toe NEURO: Awake.  Patient is not forthcoming for orientation assessment.  No apparent focal neuro deficit but limited exam. PSYCH: Calm.  Somewhat upset?  Procedures:  None  Microbiology summarized: Influenza A PCR positive. MRSA PCR screen negative. Blood cultures NGTD  Assessment & Plan: Severe sepsis due to LLE cellulitis: POA.  Had fever, leukocytosis, tachycardia, tachypnea, AKI and severe lactic acidosis.  Pro-Cal elevated.  Significant swelling, erythema and tenderness in LLE.  Erythema does not seem to be progressing.  Received a total of 5 L boluses.  Lactic acidosis resolved.  DG left tibia and fibula without osseous abnormalities.  Leukocytosis improving.  Limited LE ultrasound Doppler negative for DVT. -Continue broad-spectrum antibiotics -Check bilateral foot x-ray -Check CRP and ESR -Follow ABI if he tolerates -Pain control with as needed Tylenol, Aloxi and fentanyl -Follow blood cultures. -Transfer to progressive bed  Influenza A infection: Does not seem to have respiratory distress or symptoms -Increase Tamiflu to 75 mg twice daily now creatinine improved.  Lactic acidosis: Likely due to #1, and possibly dehydration.  No report of seizure-like activity.  Resolved.  Hypotension: Likely due to #1.  A.m. cortisol within normal.  Now normotensive. -Hold antihypertensive meds -Continue IV fluid-NS-KCl -Consider adding midodrine   AKI/azotemia: Unknown baseline. Recent Labs    08/25/21 1520 08/26/21 0532  BUN 31* 25*  CREATININE 1.33* 1.02  -  Continue IV fluid  Hypokalemia: K3.1. -IV KCl 10x6.  Uncontrolled NIDDM-2 with hyperglycemia, HLD, neuropathy: A1c 9.0%.   Recent Labs  Lab 08/25/21 2114 08/26/21 0849 08/26/21 1213  GLUCAP 166* 151* 119*  -Continue current insulin regimen -Resume home gabapentin and atorvastatin.  Normocytic anemia: Drop in Hgb likely dilutional from IV fluids. Recent Labs    08/25/21 1520 08/26/21 0532  HGB 13.9 11.0*       Vascular dementia with behavioral disturbance/history of depression and adjustment disorder: Probably at baseline. -Reorientation and delirium precautions. -Continue home meds-Depakote, Wellbutrin, Zoloft  Physical deconditioning/debility -PT/OT eval  Concern about dysphagia: RN concerned about safe swallowing given cough -SLP eval  Goal of care: Patient with comorbidity as above.  Still full code by default.  Has no capacity to make medical decision.  Was unable to reach his son over the phone.  -Consulted palliative care.  Body mass index is 28.11 kg/m.         DVT prophylaxis:  enoxaparin (LOVENOX) injection 40 mg Start: 08/25/21 2200  Code Status: Full code by default Family Communication: Unable to reach son over the phone. Level of care: Progressive Status is: Inpatient  Remains inpatient appropriate because: Severe sepsis due to LLE cellulitis requiring IV antibiotics and further evaluation   Final disposition: TBD.    Consultants:  None   Sch Meds:  Scheduled Meds:  atorvastatin  40 mg Oral Daily   buPROPion  75 mg Oral BID   chlorhexidine  15 mL Mouth Rinse BID   Chlorhexidine Gluconate Cloth  6 each Topical Daily   divalproex  125 mg Oral QHS   enoxaparin (LOVENOX) injection  40 mg Subcutaneous Q24H   gabapentin  100 mg Oral QHS   Gerhardt's butt cream   Topical TID   hydrocerin   Topical BID   insulin aspart  0-15 Units Subcutaneous TID WC   insulin aspart  0-5 Units Subcutaneous QHS   mouth rinse  15 mL Mouth Rinse q12n4p   oseltamivir  75 mg Oral BID   sertraline  200 mg Oral Daily   sodium chloride flush  3 mL Intravenous Q12H   Continuous Infusions:  ceFEPime (MAXIPIME) IV Stopped (08/26/21 1231)   magnesium sulfate bolus IVPB 50 mL/hr at 08/26/21 1240   metronidazole Stopped (08/26/21 0939)   potassium chloride 100 mL/hr at 08/26/21 1240   vancomycin Stopped (08/25/21 2255)   PRN Meds:.acetaminophen **OR** acetaminophen, fentaNYL  (SUBLIMAZE) injection, ondansetron **OR** ondansetron (ZOFRAN) IV, oxyCODONE, polyethylene glycol  Antimicrobials: Anti-infectives (From admission, onward)    Start     Dose/Rate Route Frequency Ordered Stop   08/26/21 1000  oseltamivir (TAMIFLU) capsule 30 mg  Status:  Discontinued       See Hyperspace for full Linked Orders Report.   30 mg Oral 2 times daily 08/25/21 1936 08/26/21 0723   08/26/21 1000  oseltamivir (TAMIFLU) capsule 75 mg  Status:  Discontinued       See Hyperspace for full Linked Orders Report.   75 mg Oral 2 times daily 08/26/21 0723 08/26/21 0724   08/26/21 1000  oseltamivir (TAMIFLU) capsule 75 mg        75 mg Oral 2 times daily 08/26/21 0724 08/31/21 0959   08/25/21 2100  vancomycin (VANCOREADY) IVPB 1750 mg/350 mL        1,750 mg 175 mL/hr over 120 Minutes Intravenous Every 24 hours 08/25/21 1942     08/25/21 2000  metroNIDAZOLE (FLAGYL) IVPB 500 mg  500 mg 100 mL/hr over 60 Minutes Intravenous Every 12 hours 08/25/21 1932     08/25/21 2000  ceFEPIme (MAXIPIME) 2 g in sodium chloride 0.9 % 100 mL IVPB        2 g 200 mL/hr over 30 Minutes Intravenous Every 8 hours 08/25/21 1942     08/25/21 1945  oseltamivir (TAMIFLU) capsule 75 mg  Status:  Discontinued       See Hyperspace for full Linked Orders Report.   75 mg Oral  Once 08/25/21 1936 08/26/21 0724   08/25/21 1545  cefTRIAXone (ROCEPHIN) 2 g in sodium chloride 0.9 % 100 mL IVPB  Status:  Discontinued        2 g 200 mL/hr over 30 Minutes Intravenous Every 24 hours 08/25/21 1535 08/25/21 1932        I have personally reviewed the following labs and images: CBC: Recent Labs  Lab 08/25/21 1520 08/26/21 0532  WBC 21.6* 16.8*  NEUTROABS 19.5*  --   HGB 13.9 11.0*  HCT 41.5 32.8*  MCV 87.9 88.4  PLT 281 210   BMP &GFR Recent Labs  Lab 08/25/21 1520 08/26/21 0532  NA 137 136  K 3.3* 3.1*  CL 101 101  CO2 27 25  GLUCOSE 223* 165*  BUN 31* 25*  CREATININE 1.33* 1.02  CALCIUM 8.2* 7.6*   MG 2.0 1.7   Estimated Creatinine Clearance: 82.5 mL/min (by C-G formula based on SCr of 1.02 mg/dL). Liver & Pancreas: Recent Labs  Lab 08/25/21 1520 08/26/21 0532  AST 50* 40  ALT 32 25  ALKPHOS 78 60  BILITOT 1.2 0.9  PROT 7.6 5.5*  ALBUMIN 3.0* 2.1*   No results for input(s): LIPASE, AMYLASE in the last 168 hours. No results for input(s): AMMONIA in the last 168 hours. Diabetic: No results for input(s): HGBA1C in the last 72 hours. Recent Labs  Lab 08/25/21 2114 08/26/21 0849 08/26/21 1213  GLUCAP 166* 151* 119*   Cardiac Enzymes: No results for input(s): CKTOTAL, CKMB, CKMBINDEX, TROPONINI in the last 168 hours. No results for input(s): PROBNP in the last 8760 hours. Coagulation Profile: Recent Labs  Lab 08/25/21 1520 08/26/21 0532  INR 1.1 1.3*   Thyroid Function Tests: No results for input(s): TSH, T4TOTAL, FREET4, T3FREE, THYROIDAB in the last 72 hours. Lipid Profile: No results for input(s): CHOL, HDL, LDLCALC, TRIG, CHOLHDL, LDLDIRECT in the last 72 hours. Anemia Panel: No results for input(s): VITAMINB12, FOLATE, FERRITIN, TIBC, IRON, RETICCTPCT in the last 72 hours. Urine analysis:    Component Value Date/Time   COLORURINE AMBER (A) 08/25/2021 1833   APPEARANCEUR HAZY (A) 08/25/2021 1833   LABSPEC 1.024 08/25/2021 1833   PHURINE 5.0 08/25/2021 1833   GLUCOSEU >=500 (A) 08/25/2021 1833   HGBUR MODERATE (A) 08/25/2021 1833   BILIRUBINUR NEGATIVE 08/25/2021 1833   KETONESUR 5 (A) 08/25/2021 1833   PROTEINUR >=300 (A) 08/25/2021 1833   UROBILINOGEN 1.0 04/20/2010 1105   NITRITE NEGATIVE 08/25/2021 1833   LEUKOCYTESUR NEGATIVE 08/25/2021 1833   Sepsis Labs: Invalid input(s): PROCALCITONIN, [place]  Microbiology: Recent Results (from the past 240 hour(s))  Blood Culture (routine x 2)     Status: None (Preliminary result)   Collection Time: 08/25/21  3:20 PM   Specimen: BLOOD  Result Value Ref Range Status   Specimen Description   Final     BLOOD RIGHT ANTECUBITAL Performed at [facility] [address] [place], [place] [address]    Special Requests   Final  BOTTLES DRAWN AEROBIC AND ANAEROBIC Blood Culture results may not be optimal due to an excessive volume of blood received in culture bottles Performed at [facility], [place] [address] [place], [place] [address]    Culture   Final    NO GROWTH < 24 HOURS Performed at [facility] [address] [place], [place] [address]    Report Status PENDING  Incomplete  Blood Culture (routine x 2)     Status: None (Preliminary result)   Collection Time: 08/25/21  3:20 PM   Specimen: BLOOD  Result Value Ref Range Status   Specimen Description   Final    BLOOD LEFT ANTECUBITAL Performed at [facility] [address], [place], [place] [address]    Special Requests   Final    BOTTLES DRAWN AEROBIC AND ANAEROBIC Blood Culture results may not be optimal due to an excessive volume of blood received in culture bottles Performed at [facility] [address], [place], [place] [address]    Culture   Final    NO GROWTH < 24 HOURS Performed at [facility] [address] [place],  [address]    Report Status PENDING  Incomplete  Resp Panel by RT-PCR (Flu A&B, Covid) Nasopharyngeal Swab     Status: Abnormal   Collection Time: 08/25/21  3:30 PM   Specimen: Nasopharyngeal Swab; Nasopharyngeal(NP) swabs in vial transport medium  Result Value Ref Range Status   SARS Coronavirus 2 by RT PCR NEGATIVE NEGATIVE Final    Comment: (NOTE) SARS-CoV-2 target nucleic acids are NOT DETECTED.  The SARS-CoV-2 RNA is generally detectable in upper respiratory specimens during the acute phase of infection. The lowest concentration of SARS-CoV-2 viral copies this assay can detect is 138 copies/mL. A negative result does not preclude SARS-Cov-2 infection and should not be used as the sole basis for  treatment or other patient management decisions. A negative result may occur with  improper specimen collection/handling, submission of specimen other than nasopharyngeal swab, presence of viral mutation(s) within the areas targeted by this assay, and inadequate number of viral copies(<138 copies/mL). A negative result must be combined with clinical observations, patient history, and epidemiological information. The expected result is Negative.  Fact Sheet for Patients:  EntrepreneurPulse.com.au  Fact Sheet for Healthcare Providers:  IncredibleEmployment.be  This test is no t yet approved or cleared by the [place] FDA and  has been authorized for detection and/or diagnosis of SARS-CoV-2 by FDA under an Emergency Use Authorization (EUA). This EUA will remain  in effect (meaning this test can be used) for the duration of the COVID-19 declaration under Section 564(b)(1) of the Act, 21 U.S.C.section 360bbb-3(b)(1), unless the authorization is terminated  or revoked sooner.       Influenza A by PCR POSITIVE (A) NEGATIVE Final   Influenza B by PCR NEGATIVE NEGATIVE Final    Comment: (NOTE) The Xpert Xpress SARS-CoV-2/FLU/RSV plus assay is intended as an aid in the diagnosis of influenza from Nasopharyngeal swab specimens and should not be used as a sole basis for treatment. Nasal washings and aspirates are unacceptable for Xpert Xpress SARS-CoV-2/FLU/RSV testing.  Fact Sheet for Patients: EntrepreneurPulse.com.au  Fact Sheet for Healthcare Providers: IncredibleEmployment.be  This test is not yet approved or cleared by the [place] FDA and has been authorized for detection and/or diagnosis of SARS-CoV-2 by FDA under an Emergency Use Authorization (EUA). This EUA will remain in effect (meaning this test can be  used) for the duration of the COVID-19 declaration under Section 564(b)(1) of the Act, 21  U.S.C. section 360bbb-3(b)(1), unless the authorization is terminated or revoked.  Performed at [facility], [place] [address], [place], [place] [address]   MRSA Next Gen by PCR, Nasal     Status: None   Collection Time: 08/25/21 11:20 PM   Specimen: Nasal Mucosa; Nasal Swab  Result Value Ref Range Status   MRSA by PCR Next Gen NOT DETECTED NOT DETECTED Final    Comment: (NOTE) The GeneXpert MRSA Assay (FDA approved for NASAL specimens only), is one component of a comprehensive MRSA colonization surveillance program. It is not intended to diagnose MRSA infection nor to guide or monitor treatment for MRSA infections. Test performance is not FDA approved in patients less than 13 years old. Performed at [facility], [place] [address], [place], [place] [address]     Radiology Studies: DG Tibia/Fibula Left  Result Date: 08/25/2021 CLINICAL DATA:  Cellulitis, leg swelling EXAM: LEFT TIBIA AND FIBULA - 2 VIEW COMPARISON:  None. FINDINGS: Old healed distal tibial shaft fracture. No acute fracture, subluxation or dislocation. No bone destruction to suggest osteomyelitis. Soft tissue swelling noted without soft tissue gas or radiopaque foreign body. IMPRESSION: No acute bony abnormality. Electronically Signed   By: Rolm Baptise M.D.   On: 08/25/2021 21:05   DG Chest Port 1 View  Result Date: 08/25/2021 CLINICAL DATA:  Questionable sepsis - evaluate for abnormality Left leg cellulitis. EXAM: PORTABLE CHEST 1 VIEW COMPARISON:  03/14/2019 FINDINGS: Lung volumes are low. Borderline mild cardiomegaly. The cardiomediastinal contours are normal. Streaky atelectasis at the left lung base. Pulmonary vasculature is normal. No consolidation, pleural effusion, or pneumothorax. No acute osseous abnormalities are seen. IMPRESSION: 1. Low lung volumes with left basilar atelectasis. 2. Borderline mild cardiomegaly without evidence of pulmonary edema. Electronically Signed   By:  Keith Rake M.D.   On: 08/25/2021 16:24   VAS [place] LOWER EXTREMITY VENOUS (DVT)  Result Date: 08/26/2021  Lower Venous DVT Study Patient Name:  Bryan Farrell  Date of Exam:   08/26/2021 Medical Rec #: [id number]           Accession #:    [id number] Date of Birth: 15-Nov-1950          Patient Gender: M Patient Age:   77 years Exam Location:  [facility] [place] [facility] Procedure:      VAS [place] LOWER EXTREMITY VENOUS (DVT) Referring Phys: Sheppard Coil MELVIN --------------------------------------------------------------------------------  Indications: Edema.  Risk Factors: None identified. Limitations: Poor ultrasound/tissue interface, open wound and patient pain tolerance, patient positioning, poor patient cooperation. Comparison Study: No prior studies. Performing Technologist: Oliver Hum RVT  Examination Guidelines: A complete evaluation includes B-mode imaging, spectral Doppler, color Doppler, and power Doppler as needed of all accessible portions of each vessel. Bilateral testing is considered an integral part of a complete examination. Limited examinations for reoccurring indications may be performed as noted. The reflux portion of the exam is performed with the patient in reverse Trendelenburg.  +-----+---------------+---------+-----------+----------+--------------+  RIGHT Compressibility Phasicity Spontaneity Properties Thrombus Aging  +-----+---------------+---------+-----------+----------+--------------+  CFV   Full            Yes       Yes                                    +-----+---------------+---------+-----------+----------+--------------+   +---------+---------------+---------+-----------+----------+-------------------+  LEFT  Compressibility Phasicity Spontaneity Properties Thrombus Aging       +---------+---------------+---------+-----------+----------+-------------------+  CFV       Full            Yes       Yes                                          +---------+---------------+---------+-----------+----------+-------------------+  SFJ       Full                                                                  +---------+---------------+---------+-----------+----------+-------------------+  FV Prox   Full                                                                  +---------+---------------+---------+-----------+----------+-------------------+  FV Mid    Full                                                                  +---------+---------------+---------+-----------+----------+-------------------+  FV Distal Full                                                                  +---------+---------------+---------+-----------+----------+-------------------+  PFV       Full                                                                  +---------+---------------+---------+-----------+----------+-------------------+  POP       Full            Yes       Yes                                         +---------+---------------+---------+-----------+----------+-------------------+  PTV                                                        Patency shown with  color doppler        +---------+---------------+---------+-----------+----------+-------------------+  PERO                                                       Patency shown with                                                               color doppler        +---------+---------------+---------+-----------+----------+-------------------+     Summary: RIGHT: - No evidence of common femoral vein obstruction.  LEFT: - There is no evidence of deep vein thrombosis in the lower extremity. However, portions of this examination were limited- see technologist comments above.  - No cystic structure found in the popliteal fossa.  *See table(s) above for measurements and observations.    Preliminary      CRITICAL CARE Performed by: [facility]   Total critical care time: 55 minutes  Critical care time was exclusive of separately billable procedures and treating other patients.  Critical care was necessary to treat or prevent imminent or life-threatening deterioration.  Critical care was time spent personally by me on the following activities: development of treatment plan with patient and/or surrogate as well as nursing, discussions with consultants, evaluation of patient's response to treatment, examination of patient, obtaining history from patient or surrogate, ordering and performing treatments and interventions, ordering and review of laboratory studies, ordering and review of radiographic studies, pulse oximetry and re-evaluation of patient's condition.    Bryan Farrell  If 7PM-7AM, please contact night-coverage [email or web address] 08/26/2021, 12:42 PM

## 2021-08-26 NOTE — Progress Notes (Signed)
NAME:  Bryan Farrell, MRN:  [id number], DOB:  07/21/1951, LOS: 1 ADMISSION DATE:  08/25/2021, CONSULTATION DATE:  08/26/21 REFERRING MD:  Trilby Drummer, CHIEF COMPLAINT:  sepsis   History of Present Illness:  Bryan Farrell is a 71 year male with PMH significant for dementia residing in assisted living, DM, HTN and hx of gangrene of the left toe who presented to the ED 1/12 with fever and increased redness and warmth of the LLE.  He was unable to give any history secondary to dementia, but was awake and alert with stable blood pressure and fever up to 102F.  Work-up in the ED revealed WBC 21.6, LA 8.3 up-trending to >9, Cr 1.3 above baseline <1.0 and influenza A positive.  ABG 7.45/37/59. He was given 3L IVF and Ceftriaxone and PCCM consulted for increasing lactic acid and concern for severe sepsis.  Pertinent  Medical History  DM  HTN Gangrene   Significant Hospital Events: Including procedures, antibiotic start and stop dates in addition to other pertinent events   1/12 presented to the ED with LLE cellulitis and rising lactic acid, PCCM consult. Given Ceftriaxone in the ED, broadened to Vanc/Cefepime  Interim History / Subjective:  Tmax 101.4 BP stable  3L [place]  I/O 347ml UOP, +3.7 L since admit  Pt states "I was fine until you woke me up"   Objective   Blood pressure 101/60, pulse 83, temperature (!) 101.4 F (38.6 C), temperature source Oral, resp. rate (!) 25, height 6' (1.829 m), weight 94 kg, SpO2 94 %.        Intake/Output Summary (Last 24 hours) at 08/26/2021 0721 Last data filed at 08/26/2021 0600 Gross per 24 hour  Intake 4102.43 ml  Output 351 ml  Net 3751.43 ml   Filed Weights   08/25/21 1507 08/25/21 2345  Weight: 88.5 kg 94 kg   Physical Exam: General: chronically ill appearing adult male lying in bed in NAD HEENT: MM pink/moist, anicteric, [place] O2 Neuro: awakens to voice, speech clear, MAE spontaneously  CV: s1s2 RRR, no m/r/g PULM: non-labored at rest, lungs  bilaterally with soft rhonchi  GI: soft, bsx4 active  Extremities: warm/dry, LLE erythema / edema to upper thigh, warm/TTP, RLE with chronic changes & amputation of R great toe   Resolved Hospital Problem list   Severe Lactic Acidosis  Assessment & Plan:   Severe sepsis secondary to LLE cellulitis Lactate cleared.  LLE plain film without gas, evidence of osteo.  -continue IVF -D2/x abx, continue broad spectrum abx with cefepime / vanco  -follow cultures to maturity  -f/u LE ultrasound, pending   Acute Kidney Injury Hypokalemia Likely pre-renal volume depletion in the setting of fever/sepsis -Trend BMP / urinary output -Replace electrolytes as indicated -Avoid nephrotoxic agents, ensure adequate renal perfusion  Influenza A positive  -wean O2 for sats >90% -pulmonary hygiene - IS, mobilize  -tamiflu x5 days   Type 2 DM -SSI per primary   Dementia  Poor Functional Status (POA) ALF resident  -consider [place] discussion   Best Practice (right click and "Reselect all SmartList Selections" daily)  Diet/type: Regular consistency (see orders) DVT prophylaxis: LMWH GI prophylaxis: N/A Lines: N/A Foley:  N/A Code Status:  full code Last date of multidisciplinary goals of care discussion: per primary    PCCM will be available PRN.  Please call back if new needs arise.   Critical care time:     Noe Gens, MSN, APRN, NP-C, AGACNP-BC [facility] 08/26/2021, 10:29  AM   Please see [email or web address] for pager details.   From 7A-7P if no response, please call [phone] After hours, please call ELink [phone]

## 2021-08-26 NOTE — Progress Notes (Signed)
Patient blood pressure continues to be slightly low. Patient asymptomatic, and sleeping at this time. PT becomes agitated when BP cuff goes off, timing changed to q4 hours. Will interrupt throughout night as little as possible due to history of dementia. PT has been unable to take most PO meds since admit due to swallowing issues. PT was observed by nurse choking on plain water. Speech consult has been placed to evaluate. I have been able to perform minimal care on this patient since start of shift. He has cussed at me when trying to administer his pain medications and when scanning his patient band. Will do dressing change later in the shift. Hopefully he will allow me to do it once he has had some sleep.

## 2021-08-26 NOTE — Progress Notes (Signed)
Pharmacy Antibiotic Note  Bryan Farrell is a 71 y.o. male admitted on 08/25/2021 with sepsis due to left leg cellulitis.  Pharmacy has been consulted for vancomycin and cefepime dosing. Incidentally Flu A positive, but asymptomatic at this point.  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4 C), Min:99.9 F (37.7 C), Max:102.6 F (39.2 C)  Recent Labs  Lab 08/25/21 1520 08/25/21 1714 08/25/21 1818 08/25/21 2117 08/26/21 0301 08/26/21 0532  WBC 21.6*  --   --   --   --  16.8*  CREATININE 1.33*  --   --   --   --  1.02  LATICACIDVEN 1.4 8.3* >9.0* >9.0* 1.4 1.2     Estimated Creatinine Clearance: 82.5 mL/min (by C-G formula based on SCr of 1.02 mg/dL).    No Known Allergies  Antimicrobials this admission:  1/12 CTX x 1 1/12 Cefepime/Flagyl >> 1/13 Zosyn 1/12 Vanc >> 1/12 Tamiflu >>  Dose adjustments this admission:  1/13 incr vanc 1750 q24 >> 1250 q12 w/ improved SCr  Microbiology results:  1/12 BCx: ngtd 1/12 UCx: sent 1/12 Influenza A: POS  Thank you for allowing pharmacy to be a part of this patients care.  Bernadene Person, PharmD,  BCPS [phone] 08/26/2021, 3:27 PM

## 2021-08-26 NOTE — Progress Notes (Signed)
K+ 3.1 °Replaced per protocol  °

## 2021-08-26 NOTE — Progress Notes (Signed)
Unable to perform ABI. The patient is in a poor, contracted position, is very uncooperative, and mildly combative. Furthermore, the patient will be unable to remain still during the test, which will result in inaccurate test results.  Please contact the vascular lab if the patient's disposition changes.  08/26/21 10:48 AM Olen Cordial RVT

## 2021-08-26 NOTE — Consult Note (Signed)
WOC Nurse Consult Note: Reason for Consult:Bilateral feet and LEs.  Consult performed remotely after review of the EHR including photographs and after collaboration with the patient's bedside RN, T. Margo Aye. Wound type: Neuropathic, surgical, moisture Pressure Injury POA: N/A Measurement:N/A Wound bed: obscured by the presence of dried serum on left foot between great toe and 3rd digit (amputation site) and right affected digit.  Also small skin tear noted on scrotum Drainage (amount, consistency, odor) dried serum Periwound:erythematous, edematous, R>L Dressing procedure/placement/frequency: I have provided Nursing with guidance for the care of this patient's integumentary system. Prevalon Boots are provided and guidance for twice daily topical care to the legs and feet using a nonadherent antimicrobial (xeroform) gauze.  Turning and repositioning is in place and staff will minimize time in the supine position, but I have provided Gerhart's Butt Cream, a compounded 1:1:1 preparation of zinc oxide:hydrocortisone:lotrimin for the buttocks, scrotal area as well as the medial and posterior thighs.While in ICU, the patient is on a mattress replacement with low air loss feature.  WOC nursing team will not follow, but will remain available to this patient, the nursing and medical teams.  Please re-consult if needed. Thanks, Ladona Mow, MSN, RN, GNP, [facility] [place]  Pager# [phone]

## 2021-08-26 NOTE — Progress Notes (Signed)
Left lower extremity venous duplex has been completed. Preliminary results can be found in CV Proc through chart review.   08/26/21 10:13 AM Olen Cordial RVT

## 2021-08-26 NOTE — Progress Notes (Signed)
Pt BP low.Systolic in the 80s. MAP 55. Dr. Alanda Slim notified. Ordered 500cc NS bolus.

## 2021-08-27 DIAGNOSIS — Z515 Encounter for palliative care: Secondary | ICD-10-CM

## 2021-08-27 DIAGNOSIS — L089 Local infection of the skin and subcutaneous tissue, unspecified: Secondary | ICD-10-CM

## 2021-08-27 LAB — SEDIMENTATION RATE: Sed Rate: 82 mm/hr — ABNORMAL HIGH (ref 0–16)

## 2021-08-27 LAB — MAGNESIUM: Magnesium: 2.1 mg/dL (ref 1.7–2.4)

## 2021-08-27 LAB — CREATININE, SERUM
Creatinine, Ser: 1.11 mg/dL (ref 0.61–1.24)
GFR, Estimated: >60 mL/min (ref >60)

## 2021-08-27 LAB — CBC
HCT: 34.5 % — ABNORMAL LOW (ref 39.0–52.0)
Hemoglobin: 11.1 g/dL — ABNORMAL LOW (ref 13.0–17.0)
MCH: 29.1 pg (ref 26.0–34.0)
MCHC: 32.2 g/dL (ref 30.0–36.0)
MCV: 90.6 fL (ref 80.0–100.0)
Platelets: 250 10*3/uL (ref 150–400)
RBC: 3.81 MIL/uL — ABNORMAL LOW (ref 4.22–5.81)
RDW: 13.6 % (ref 11.5–15.5)
WBC: 16.8 10*3/uL — ABNORMAL HIGH (ref 4.0–10.5)
nRBC: 0 % (ref 0.0–0.2)

## 2021-08-27 LAB — RENAL FUNCTION PANEL
Albumin: 2.1 g/dL — ABNORMAL LOW (ref 3.5–5.0)
Anion gap: 7 (ref 5–15)
BUN: 26 mg/dL — ABNORMAL HIGH (ref 8–23)
CO2: 23 mmol/L (ref 22–32)
Calcium: 7.5 mg/dL — ABNORMAL LOW (ref 8.9–10.3)
Chloride: 109 mmol/L (ref 98–111)
Creatinine, Ser: 1.08 mg/dL (ref 0.61–1.24)
GFR, Estimated: >60 mL/min (ref >60)
Glucose, Bld: 110 mg/dL — ABNORMAL HIGH (ref 70–99)
Phosphorus: 2.6 mg/dL (ref 2.5–4.6)
Potassium: 3.7 mmol/L (ref 3.5–5.1)
Sodium: 139 mmol/L (ref 135–145)

## 2021-08-27 LAB — GLUCOSE, CAPILLARY
Glucose-Capillary: 101 mg/dL — ABNORMAL HIGH (ref 70–99)
Glucose-Capillary: 157 mg/dL — ABNORMAL HIGH (ref 70–99)
Glucose-Capillary: 243 mg/dL — ABNORMAL HIGH (ref 70–99)
Glucose-Capillary: 178 mg/dL — ABNORMAL HIGH (ref 70–99)

## 2021-08-27 LAB — CK: Total CK: 245 U/L (ref 49–397)

## 2021-08-27 LAB — C-REACTIVE PROTEIN: CRP: 29.6 mg/dL — ABNORMAL HIGH (ref <1.0)

## 2021-08-27 MED ORDER — FOOD THICKENER (SIMPLYTHICK): 10.0000 | ORAL | Status: DC | PRN

## 2021-08-27 NOTE — Evaluation (Signed)
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 male admitted from [facility] with sepsis due to left leg cellulitis.  Influenza A +. PMHx dementia, diabetes, history of left toe osteo and amputation, adjustment disorder, depression. Pt has been coughing with PO intake, precipitating swallowing consult.    Assessment / Plan / Recommendation  Clinical Impression  Pt participated in clinical swallow assessment, leading to equivocal findings.  He presented with intermittent coughing prior to PO consumption and throughout evaluation, making it difficult to discern presence of aspiration. There were no other red flags for a dementia-related dysphagia: he was alert, demonstrated good oral attention and manipulation, the appearance of a brisk swallow; voice was clear and strong.  There may be mechanical issues predisposing him to dysphagia.  For now, recommend thickening liquids to nectar prophylactically; give meds whole in puree.  If clinical symptoms aren't clarified by Monday, he may benefit from an MBS. D/W RN and Dr. Tyson Babinski.  SLP will follow. SLP Visit Diagnosis: Dysphagia, unspecified (R13.10)    Aspiration Risk    tba   Diet Recommendation   Regular solids, nectar liquids  Medication  Administration: Whole meds with puree    Other  Recommendations Oral Care Recommendations: Oral care BID Other Recommendations: Order thickener from pharmacy    Recommendations for follow up therapy are one component of a multi-disciplinary discharge planning process, led by the attending physician.  Recommendations may be updated based on patient status, additional functional criteria and insurance authorization.  Follow up Recommendations Other (comment) (tba)      Assistance Recommended at Discharge    Functional Status Assessment    Frequency and Duration min 2x/week  1 week       Prognosis        Swallow Study   General HPI: 71 y.o. male admitted from [facility] with sepsis due to left leg cellulitis.  Influenza A +. PMHx dementia, diabetes, history of left toe osteo and amputation, adjustment disorder, depression. Pt has been coughing with PO intake, precipitating swallowing consult. Type of Study: Bedside Swallow Evaluation Previous Swallow Assessment: no Diet Prior to this Study: Regular;Thin liquids Temperature Spikes Noted: No Respiratory Status: Nasal cannula History of Recent Intubation: No Behavior/Cognition: Alert Oral Cavity Assessment: Within Functional Limits Oral Care Completed by SLP: No Oral Cavity - Dentition: Missing dentition;Poor condition Vision: Functional for self-feeding Self-Feeding Abilities: Able to feed self Patient Positioning: Upright in bed Baseline Vocal Quality: Normal Volitional Cough: Strong Volitional Swallow: Able to elicit    Oral/Motor/Sensory Function Overall Oral Motor/Sensory Function: Within functional limits   Ice Chips Ice chips: Within functional limits   Thin Liquid Thin Liquid: Impaired Presentation: Cup;Straw Pharyngeal  Phase Impairments: Cough - Delayed    Nectar Thick Nectar Thick Liquid: Impaired  Pharyngeal Phase Impairments: Cough - Delayed   Honey Thick Honey Thick Liquid: Not tested   Puree Puree:  Impaired Presentation: Spoon Pharyngeal Phase Impairments: Cough - Delayed   Solid     Solid: Impaired Pharyngeal Phase Impairments: Cough - Delayed      Blenda Mounts Laurice 08/27/2021,9:49 AM  Marchelle Folks L. Samson Frederic, MA CCC/SLP Acute Rehabilitation Services Office number [phone] Pager [phone]

## 2021-08-27 NOTE — Evaluation (Signed)
Occupational Therapy Evaluation Patient Details Name: Bryan Farrell MRN: [id number] DOB: 06/13/1951 Today's Date: 08/27/2021   History of Present Illness 71 year old Male with past medical history of vascular dementia, diabetes mellitus type 2,  remote left great toe osteomyelitis s/p amputation and depression was brought into the hospital from nursing home with increasing left lower extremity redness swelling.  Patient was then admitted hospital for severe cellulitis and sepsis.   Clinical Impression   [name] is a 71 year old man from local SNF who presents to hospital with LLE cellulitis and sepsis. Evaluation limited by patient's pain and not wanting to transfer. Detailed information in regards to patient's prior level unknown. Per nursing from facility he was able to transfer himself to the wheelchair - no information in regards to ADLs but suspect he needed assistance due to dementia and status of toes (amputations and necrotic toes). Patient demonstrated good upper body strength and ability to roll left and right, feed and groom himself. Nursing reports patient can become agitated with activity and begins cursing.  Patient alert to self but not place or situation. He is unable to report on his prior level of function. He has significant pain in his LLE and declines out of bed activity. From an OT standpoint therapy can be initiated at next venue of care - in his typical environment, with clothing and wheelchair. Defer to PT for mobility needs.     Recommendations for follow up therapy are one component of a multi-disciplinary discharge planning process, led by the attending physician.  Recommendations may be updated based on patient status, additional functional criteria and insurance authorization.   Follow Up Recommendations  Skilled nursing-short term rehab (<3 hours/day)    Assistance Recommended at Discharge Frequent or constant Supervision/Assistance  Patient can return  home with the following A lot of help with walking and/or transfers;A lot of help with bathing/dressing/bathroom    Functional Status Assessment  Patient has had a recent decline in their functional status and demonstrates the ability to make significant improvements in function in a reasonable and predictable amount of time.  Equipment Recommendations  None recommended by OT    Recommendations for Other Services       Precautions / Restrictions Precautions Precautions: Fall Precaution Comments: painful LLE, be careful with toes Restrictions Weight Bearing Restrictions: No      Mobility   Balance    ADL either performed or assessed with clinical judgement   ADL Overall ADL's : Needs assistance/impaired Eating/Feeding: Set up;Bed level   Grooming: Set up;Bed level   Upper Body Bathing: Minimal assistance;Bed level;Cueing for sequencing   Lower Body Bathing: Total assistance;Bed level   Upper Body Dressing : Minimal assistance;Bed level   Lower Body Dressing: Total assistance;Bed level   Toilet Transfer: Total assistance;+2 for physical assistance   Toileting- Clothing Manipulation and Hygiene: Total assistance;Bed level               Vision Patient Visual Report: No change from baseline Additional Comments: visual deficits at baseline. reports he can't "see well."     Perception     Praxis      Pertinent Vitals/Pain Pain Assessment: Faces Faces Pain Scale: Hurts whole lot Breathing: normal Negative Vocalization: none Facial Expression: smiling or inexpressive Body Language: relaxed Consolability: no need to console PAINAD Score: 0 Pain Location: left leg Pain Descriptors / Indicators: Grimacing;Guarding;Moaning Pain Intervention(s): Limited activity within patient's tolerance     Hand Dominance Right   Extremity/Trunk Assessment  Upper Extremity Assessment Upper Extremity Assessment: RUE deficits/detail;LUE deficits/detail RUE Deficits /  Details: WFL ROM, 5/5 strength RUE Coordination: WNL LUE Deficits / Details: WFL ROM, 5/5 strength LUE Coordination: WNL   Lower Extremity Assessment Lower Extremity Assessment: Defer to PT evaluation       Communication Communication Communication: No difficulties   Cognition Arousal/Alertness: Awake/alert Behavior During Therapy: WFL for tasks assessed/performed Overall Cognitive Status: History of cognitive impairments - at baseline                                 General Comments: Alert to self, birthdate, and year. Doesn't know where he is at or able to answer questions about PLOF. States "I don't know" and "whatever" a lot. Rn reports patient is reluctant to perform a lot of tasks and curses with activity.     General Comments       Exercises     Shoulder Instructions      Home Living Family/patient expects to be discharged to:: Skilled nursing facility                                        Prior Functioning/Environment Prior Level of Function : Needs assist             Mobility Comments: facility reports patient could transfer himself to the wheelchair ADLs Comments: needs assist, can feed himself        OT Problem List: Decreased activity tolerance;Impaired balance (sitting and/or standing);Decreased safety awareness;Pain;Decreased cognition;Decreased knowledge of precautions;Decreased knowledge of use of DME or AE                Co-evaluation              AM-PAC OT "6 Clicks" Daily Activity     Outcome Measure Help from another person eating meals?: A Little Help from another person taking care of personal grooming?: A Little Help from another person toileting, which includes using toliet, bedpan, or urinal?: Total Help from another person bathing (including washing, rinsing, drying)?: A Lot Help from another person to put on and taking off regular upper body clothing?: A Little Help from another person to put  on and taking off regular lower body clothing?: Total 6 Click Score: 13   End of Session Nurse Communication: Mobility status  Activity Tolerance: Patient limited by pain Patient left: in bed;with call bell/phone within reach;with bed alarm set  OT Visit Diagnosis: Pain                Time: 1331-1346 OT Time Calculation (min): 15 min Charges:  OT General Charges $OT Visit: 1 Visit OT Evaluation $OT Eval Low Complexity: 1 Low  Bocephus Cali, OTR/L Acute Care Rehab Services  Office [phone] Pager: [phone]   Kelli Churn 08/27/2021, 2:23 PM

## 2021-08-27 NOTE — Evaluation (Signed)
Physical Therapy Evaluation Patient Details Name: Bryan Farrell MRN: [id number] DOB: 1951-07-04 Today's Date: 08/27/2021  History of Present Illness  71 year old Male with past medical history of vascular dementia, diabetes mellitus type 2,  remote left great toe osteomyelitis s/p amputation and depression was brought into the hospital from nursing home with increasing left lower extremity redness swelling.  Patient was then admitted hospital for severe cellulitis and sepsis.  Clinical Impression  Bryan Farrell is a 71 year old man from local SNF who presents to hospital with LLE cellulitis and sepsis. Evaluation limited by patient's pain and not wanting to transfer. Detailed information in regards to patient's prior level unknown. Per nursing from facility he was able to transfer himself to the wheelchair. Nursing reports patient can become agitated with activity and begins cursing.  Patient alert to self but not place or situation. He is unable to report on his prior level of function. He has significant pain in his LLE and declines out of bed activity. Pt could benefit from acute care PT to progress toward PLOF with pt transferring to wc.       Recommendations for follow up therapy are one component of a multi-disciplinary discharge planning process, led by the attending physician.  Recommendations may be updated based on patient status, additional functional criteria and insurance authorization.  Follow Up Recommendations Skilled nursing-short term rehab (<3 hours/day)    Assistance Recommended at Discharge Frequent or constant Supervision/Assistance  Patient can return home with the following  Two people to help with walking and/or transfers;Two people to help with bathing/dressing/bathroom;Assistance with cooking/housework    Equipment Recommendations    Recommendations for Other Services       Functional Status Assessment Patient has had a recent decline in their functional  status and demonstrates the ability to make significant improvements in function in a reasonable and predictable amount of time.     Precautions / Restrictions Precautions Precautions: Fall Precaution Comments: painful LLE, be careful with toes Restrictions Weight Bearing Restrictions: No      Mobility  Bed Mobility Overal bed mobility: Needs Assistance Bed Mobility: Rolling Rolling: Min assist         General bed mobility comments: Min assist to roll (if he is agreeable)    Transfers                   General transfer comment: deferred - pt declines to progress further 2* pain    Ambulation/Gait                  Stairs            Wheelchair Mobility    Modified Rankin (Stroke Patients Only)       Balance                                             Pertinent Vitals/Pain Pain Assessment: Faces Faces Pain Scale: Hurts whole lot Breathing: normal Negative Vocalization: none Facial Expression: smiling or inexpressive Body Language: relaxed Consolability: no need to console PAINAD Score: 0 Pain Location: left leg Pain Descriptors / Indicators: Grimacing;Guarding;Moaning Pain Intervention(s): Limited activity within patient's tolerance;Monitored during session    Home Living Family/patient expects to be discharged to:: Skilled nursing facility                   Additional Comments: pt  is poor historian    Prior Function Prior Level of Function : Needs assist             Mobility Comments: facility reports patient could transfer himself to the wheelchair ADLs Comments: needs assist, can feed himself     Hand Dominance   Dominant Hand: Right    Extremity/Trunk Assessment   Upper Extremity Assessment Upper Extremity Assessment: Defer to OT evaluation RUE Deficits / Details: WFL ROM, 5/5 strength RUE Coordination: WNL LUE Deficits / Details: WFL ROM, 5/5 strength LUE Coordination: WNL    Lower  Extremity Assessment Lower Extremity Assessment: Generalized weakness;RLE deficits/detail;LLE deficits/detail RLE: Unable to fully assess due to pain LLE: Unable to fully assess due to pain       Communication   Communication: No difficulties  Cognition Arousal/Alertness: Awake/alert Behavior During Therapy: WFL for tasks assessed/performed Overall Cognitive Status: History of cognitive impairments - at baseline                                 General Comments: Alert to self, birthdate, and year. Doesn't know where he is at or able to answer questions about PLOF. States "I don't know" and "whatever" a lot. Rn reports patient is reluctant to perform a lot of tasks and curses with activity.        General Comments      Exercises     Assessment/Plan    PT Assessment Patient needs continued PT services  PT Problem List Decreased strength;Decreased range of motion;Decreased activity tolerance;Decreased mobility;Decreased knowledge of use of DME;Pain;Decreased balance;Decreased cognition       PT Treatment Interventions Functional mobility training;Therapeutic activities;Therapeutic exercise;Patient/family education;Balance training    PT Goals (Current goals can be found in the Care Plan section)  Acute Rehab PT Goals Patient Stated Goal: Limit tx 2* pain with movement PT Goal Formulation: With patient Time For Goal Achievement: 09/10/21 Potential to Achieve Goals: Fair    Frequency Min 2X/week     Co-evaluation PT/OT/SLP Co-Evaluation/Treatment: Yes Reason for Co-Treatment: For patient/therapist safety;To address functional/ADL transfers PT goals addressed during session: Mobility/safety with mobility OT goals addressed during session: ADL's and self-care       AM-PAC PT "6 Clicks" Mobility  Outcome Measure Help needed turning from your back to your side while in a flat bed without using bedrails?: Total Help needed moving from lying on your back to  sitting on the side of a flat bed without using bedrails?: Total Help needed moving to and from a bed to a chair (including a wheelchair)?: Total Help needed standing up from a chair using your arms (e.g., wheelchair or bedside chair)?: Total Help needed to walk in hospital room?: Total Help needed climbing 3-5 steps with a railing? : Total 6 Click Score: 6    End of Session   Activity Tolerance: Patient limited by pain Patient left: in bed;with call bell/phone within reach;with bed alarm set Nurse Communication: Mobility status;Need for lift equipment PT Visit Diagnosis: Muscle weakness (generalized) (M62.81);History of falling (Z91.81);Difficulty in walking, not elsewhere classified (R26.2);Pain Pain - part of body:  (bil LEs (R>L))    Time: 1331-1350 PT Time Calculation (min) (ACUTE ONLY): 19 min   Charges:   PT Evaluation $PT Eval Low Complexity: 1 Low          Mauro Kaufmann PT Acute Rehabilitation Services Pager [phone] Office [phone]   Bryan Farrell 08/27/2021, 4:31 PM

## 2021-08-27 NOTE — Progress Notes (Signed)
PROGRESS NOTE  Bryan Farrell VHQ:[id number] DOB: 1951-04-25   PCP: Garwin Brothers, MD  Brief Narrative:  71 year old Male with past medical history of vascular dementia, diabetes mellitus type 2,  remote left great toe osteomyelitis s/p amputation and depression was brought into the hospital from nursing home with increasing left lower extremity redness swelling.  Patient was then admitted hospital for severe cellulitis and sepsis.  Initial temperature was elevated at 102.6 F with leukocytosis at 20 6K.  Patient did have significant lactic acidosis.  Blood cultures were obtained patient patient received IV fluid boluses and was initially admitted to the stepdown unit.  Patient also tested positive for influenza.    Subjective:  Today, patient was seen and examined at bedside.  Complains mild cough and foot pain.  Denies any nausea, vomiting, shortness of breath, cough or fever.  Feels frustrated.    Objective: Vitals:   08/27/21 0600 08/27/21 0700 08/27/21 0800 08/27/21 0815  BP:   (!) 115/58   Pulse: 83 87 86   Resp: (!) 24 (!) 24 (!) 0   Temp:    97.9 F (36.6 C)  TempSrc:    Oral  SpO2: 96% 93% 97%   Weight:      Height:        Physical examination: General:  Average built, not in obvious distress HENT:   No scleral pallor or icterus noted. Oral mucosa is moist.  Chest:  Clear breath sounds.  Diminished breath sounds bilaterally. No crackles or wheezes.  CVS: S1 &S2 heard. No murmur.  Regular rate and rhythm. Abdomen: Soft, nontender, nondistended.  Bowel sounds are heard.   Extremities: Status post left great toe amputation.  Erythema edema over the left foot.  Right foot with amputated first toe, second toe with bruising. Psych: Alert, awake and communicative. CNS:  No cranial nerve deficits.  Power equal in all extremities.   Skin: Warm and dry.  No rashes noted.  Procedures:  None  Assessment & Plan:  Severe sepsis due to LLE cellulitis: Present on admission.   Received multiple fluid boluses.  Elevated lactate.  Lower extremity ultrasound was negative for DVT.  ESR elevated.  Follow blood cultures.  Supportive care.  Left foot with subcutaneous soft tissue edema and emphysema with no definite radiographic evidence of osteomyelitis.  Influenza A infection:  Continue Tamiflu.    Lactic acidosis:  Likely secondary to infection and volume depletion.  Check lactate again today.  Hypotension:  Continue to hold antihypertensive agents.  Received IV fluids.  A.m. cortisol was within normal limits blood pressure has improved at this time.    AKI/azotemia:  Improved with IV hydration.  Monitor BMP closely.  Hypokalemia:  Has been replenished.  Latest potassium of 3.7.  We will continue to monitor and replenish as necessary.  Uncontrolled NIDDM-2 with hyperglycemia, diabetic neuropathy: Latest A1c 9.0%.  Continue sliding scale insulin.  Continue gabapentin and Lipitor.  Normocytic anemia:  Mild drop in hemoglobin at 11.1.   Vascular dementia with behavioral disturbance/history of depression and adjustment disorder: Continue Depakote, Wellbutrin and Zoloft.  Likely at baseline.    Physical deconditioning/debility We will get physical therapy occasional therapy evaluation.  Patient is from skilled nursing facility  Concern about dysphagia: Speech therapy on board.  Goal of care: Patient did not seem to have capacity for medical decision.  Palliative care has been consulted.  Disposition.  Patient is from skilled nursing facility. Likely disposition plan to skilled nursing facility when improved.  DVT  prophylaxis:  enoxaparin (LOVENOX) injection 40 mg Start: 08/25/21 2200  Code Status: Full code  Family Communication:   Spoke with the patient's son on the phone and updated him about the clinical condition of the patient.  Status is: Inpatient  Remains inpatient appropriate because: Severe sepsis due to LLE cellulitis requiring IV antibiotics  and further evaluation  Consultants:  None    Sch Meds:  Scheduled Meds:  atorvastatin  40 mg Oral Daily   buPROPion  75 mg Oral BID   chlorhexidine  15 mL Mouth Rinse BID   Chlorhexidine Gluconate Cloth  6 each Topical Daily   divalproex  125 mg Oral QHS   enoxaparin (LOVENOX) injection  40 mg Subcutaneous Q24H   feeding supplement  237 mL Oral BID BM   gabapentin  100 mg Oral QHS   Gerhardt's butt cream   Topical TID   hydrocerin   Topical BID   insulin aspart  0-15 Units Subcutaneous TID WC   insulin aspart  0-5 Units Subcutaneous QHS   mouth rinse  15 mL Mouth Rinse q12n4p   midodrine  5 mg Oral TID WC   multivitamin with minerals  1 tablet Oral Daily   oseltamivir  75 mg Oral BID   sertraline  200 mg Oral Daily   sodium chloride flush  3 mL Intravenous Q12H   Continuous Infusions:  0.9 % NaCl with KCl 20 mEq / L 100 mL/hr at 08/27/21 0746   piperacillin-tazobactam (ZOSYN)  IV 12.5 mL/hr at 08/27/21 0746   vancomycin Stopped (08/27/21 0645)   PRN Meds:.acetaminophen **OR** acetaminophen, fentaNYL (SUBLIMAZE) injection, food thickener, ondansetron **OR** ondansetron (ZOFRAN) IV, oxyCODONE, polyethylene glycol  Antimicrobials:  Vancomycin, Zosyn,   Anti-infectives (From admission, onward)    Start     Dose/Rate Route Frequency Ordered Stop   08/26/21 2100  piperacillin-tazobactam (ZOSYN) IVPB 3.375 g        3.375 g 12.5 mL/hr over 240 Minutes Intravenous Every 8 hours 08/26/21 1443     08/26/21 1600  vancomycin (VANCOREADY) IVPB 1250 mg/250 mL        1,250 mg 166.7 mL/hr over 90 Minutes Intravenous 2 times daily 08/26/21 1507     08/26/21 1000  oseltamivir (TAMIFLU) capsule 30 mg  Status:  Discontinued       See Hyperspace for full Linked Orders Report.   30 mg Oral 2 times daily 08/25/21 1936 08/26/21 0723   08/26/21 1000  oseltamivir (TAMIFLU) capsule 75 mg  Status:  Discontinued       See Hyperspace for full Linked Orders Report.   75 mg Oral 2 times daily  08/26/21 0723 08/26/21 0724   08/26/21 1000  oseltamivir (TAMIFLU) capsule 75 mg        75 mg Oral 2 times daily 08/26/21 0724 08/31/21 0959   08/25/21 2100  vancomycin (VANCOREADY) IVPB 1750 mg/350 mL  Status:  Discontinued        1,750 mg 175 mL/hr over 120 Minutes Intravenous Every 24 hours 08/25/21 1942 08/26/21 1507   08/25/21 2000  metroNIDAZOLE (FLAGYL) IVPB 500 mg  Status:  Discontinued        500 mg 100 mL/hr over 60 Minutes Intravenous Every 12 hours 08/25/21 1932 08/26/21 1443   08/25/21 2000  ceFEPIme (MAXIPIME) 2 g in sodium chloride 0.9 % 100 mL IVPB  Status:  Discontinued        2 g 200 mL/hr over 30 Minutes Intravenous Every 8 hours 08/25/21 1942 08/26/21 1443  08/25/21 1945  oseltamivir (TAMIFLU) capsule 75 mg  Status:  Discontinued       See Hyperspace for full Linked Orders Report.   75 mg Oral  Once 08/25/21 1936 08/26/21 0724   08/25/21 1545  cefTRIAXone (ROCEPHIN) 2 g in sodium chloride 0.9 % 100 mL IVPB  Status:  Discontinued        2 g 200 mL/hr over 30 Minutes Intravenous Every 24 hours 08/25/21 1535 08/25/21 1932       I have personally reviewed the following labs and imaging studies.  CBC: Recent Labs  Lab 08/25/21 1520 08/26/21 0532 08/27/21 0307  WBC 21.6* 16.8* 16.8*  NEUTROABS 19.5*  --   --   HGB 13.9 11.0* 11.1*  HCT 41.5 32.8* 34.5*  MCV 87.9 88.4 90.6  PLT 281 210 250    BMP &GFR Recent Labs  Lab 08/25/21 1520 08/26/21 0532 08/27/21 0307  NA 137 136 139  K 3.3* 3.1* 3.7  CL 101 101 109  CO2 27 25 23   GLUCOSE 223* 165* 110*  BUN 31* 25* 26*  CREATININE 1.33* 1.02 1.08   1.11  CALCIUM 8.2* 7.6* 7.5*  MG 2.0 1.7 2.1  PHOS  --   --  2.6    Estimated Creatinine Clearance: 78 mL/min (by C-G formula based on SCr of 1.08 mg/dL). Liver & Pancreas: Recent Labs  Lab 08/25/21 1520 08/26/21 0532 08/27/21 0307  AST 50* 40  --   ALT 32 25  --   ALKPHOS 78 60  --   BILITOT 1.2 0.9  --   PROT 7.6 5.5*  --   ALBUMIN 3.0* 2.1* 2.1*     No results for input(s): LIPASE, AMYLASE in the last 168 hours. No results for input(s): AMMONIA in the last 168 hours. Diabetic: No results for input(s): HGBA1C in the last 72 hours. Recent Labs  Lab 08/26/21 0849 08/26/21 1213 08/26/21 1650 08/26/21 2152 08/27/21 0756  GLUCAP 151* 119* 118* 118* 101*    Cardiac Enzymes: Recent Labs  Lab 08/27/21 0307  CKTOTAL 245   No results for input(s): PROBNP in the last 8760 hours. Coagulation Profile: Recent Labs  Lab 08/25/21 1520 08/26/21 0532  INR 1.1 1.3*    Thyroid Function Tests: No results for input(s): TSH, T4TOTAL, FREET4, T3FREE, THYROIDAB in the last 72 hours. Lipid Profile: No results for input(s): CHOL, HDL, LDLCALC, TRIG, CHOLHDL, LDLDIRECT in the last 72 hours. Anemia Panel: No results for input(s): VITAMINB12, FOLATE, FERRITIN, TIBC, IRON, RETICCTPCT in the last 72 hours. Urine analysis:    Component Value Date/Time   COLORURINE AMBER (A) 08/25/2021 1833   APPEARANCEUR HAZY (A) 08/25/2021 1833   LABSPEC 1.024 08/25/2021 1833   PHURINE 5.0 08/25/2021 1833   GLUCOSEU >=500 (A) 08/25/2021 1833   HGBUR MODERATE (A) 08/25/2021 1833   BILIRUBINUR NEGATIVE 08/25/2021 1833   KETONESUR 5 (A) 08/25/2021 1833   PROTEINUR >=300 (A) 08/25/2021 1833   UROBILINOGEN 1.0 04/20/2010 1105   NITRITE NEGATIVE 08/25/2021 1833   LEUKOCYTESUR NEGATIVE 08/25/2021 1833   Sepsis Labs: Invalid input(s): PROCALCITONIN, [place]  Microbiology: Recent Results (from the past 240 hour(s))  Blood Culture (routine x 2)     Status: None (Preliminary result)   Collection Time: 08/25/21  3:20 PM   Specimen: BLOOD  Result Value Ref Range Status   Specimen Description   Final    BLOOD RIGHT ANTECUBITAL Performed at [facility] [address], [place], [place] [address]    Special Requests  Final    BOTTLES DRAWN AEROBIC AND ANAEROBIC Blood Culture results may not be optimal due to an excessive volume of  blood received in culture bottles Performed at [facility] [address], [place], [place] [address]    Culture   Final    NO GROWTH < 24 HOURS Performed at [facility] [address], [place], [place] [address]    Report Status PENDING  Incomplete  Blood Culture (routine x 2)     Status: None (Preliminary result)   Collection Time: 08/25/21  3:20 PM   Specimen: BLOOD  Result Value Ref Range Status   Specimen Description   Final    BLOOD LEFT ANTECUBITAL Performed at [facility] [address] [place], [place] [address]    Special Requests   Final    BOTTLES DRAWN AEROBIC AND ANAEROBIC Blood Culture results may not be optimal due to an excessive volume of blood received in culture bottles Performed at [facility] [address], [place], [place] [address]    Culture   Final    NO GROWTH < 24 HOURS Performed at [facility] [address], [place], [place] [address]    Report Status PENDING  Incomplete  Resp Panel by RT-PCR (Flu A&B, Covid) Nasopharyngeal Swab     Status: Abnormal   Collection Time: 08/25/21  3:30 PM   Specimen: Nasopharyngeal Swab; Nasopharyngeal(NP) swabs in vial transport medium  Result Value Ref Range Status   SARS Coronavirus 2 by RT PCR NEGATIVE NEGATIVE Final    Comment: (NOTE) SARS-CoV-2 target nucleic acids are NOT DETECTED.  The SARS-CoV-2 RNA is generally detectable in upper respiratory specimens during the acute phase of infection. The lowest concentration of SARS-CoV-2 viral copies this assay can detect is 138 copies/mL. A negative result does not preclude SARS-Cov-2 infection and should not be used as the sole basis for treatment or other patient management decisions. A negative result may occur with  improper specimen collection/handling, submission of specimen other than nasopharyngeal swab, presence of viral mutation(s) within the areas targeted by this assay, and  inadequate number of viral copies(<138 copies/mL). A negative result must be combined with clinical observations, patient history, and epidemiological information. The expected result is Negative.  Fact Sheet for Patients:  EntrepreneurPulse.com.au  Fact Sheet for Healthcare Providers:  IncredibleEmployment.be  This test is no t yet approved or cleared by the [place] FDA and  has been authorized for detection and/or diagnosis of SARS-CoV-2 by FDA under an Emergency Use Authorization (EUA). This EUA will remain  in effect (meaning this test can be used) for the duration of the COVID-19 declaration under Section 564(b)(1) of the Act, 21 U.S.C.section 360bbb-3(b)(1), unless the authorization is terminated  or revoked sooner.       Influenza A by PCR POSITIVE (A) NEGATIVE Final   Influenza B by PCR NEGATIVE NEGATIVE Final    Comment: (NOTE) The Xpert Xpress SARS-CoV-2/FLU/RSV plus assay is intended as an aid in the diagnosis of influenza from Nasopharyngeal swab specimens and should not be used as a sole basis for treatment. Nasal washings and aspirates are unacceptable for Xpert Xpress SARS-CoV-2/FLU/RSV testing.  Fact Sheet for Patients: EntrepreneurPulse.com.au  Fact Sheet for Healthcare Providers: IncredibleEmployment.be  This test is not yet approved or cleared by the [place] FDA and has been authorized for detection and/or diagnosis of SARS-CoV-2 by FDA under an Emergency Use Authorization (EUA). This EUA will remain in effect (  meaning this test can be used) for the duration of the COVID-19 declaration under Section 564(b)(1) of the Act, 21 U.S.C. section 360bbb-3(b)(1), unless the authorization is terminated or revoked.  Performed at [facility], [place] [address] [place], [place] [address]   Urine Culture     Status: None   Collection Time: 08/25/21  6:33 PM    Specimen: In/Out Cath Urine  Result Value Ref Range Status   Specimen Description   Final    IN/OUT CATH URINE Performed at [facility] [address], [place], [place] [address]    Special Requests   Final    NONE Performed at [facility], [place] [address], [place], [place] [address]    Culture   Final    NO GROWTH Performed at [facility], [place] [address] [place], [place] [address]    Report Status 08/26/2021 FINAL  Final  MRSA Next Gen by PCR, Nasal     Status: None   Collection Time: 08/25/21 11:20 PM   Specimen: Nasal Mucosa; Nasal Swab  Result Value Ref Range Status   MRSA by PCR Next Gen NOT DETECTED NOT DETECTED Final    Comment: (NOTE) The GeneXpert MRSA Assay (FDA approved for NASAL specimens only), is one component of a comprehensive MRSA colonization surveillance program. It is not intended to diagnose MRSA infection nor to guide or monitor treatment for MRSA infections. Test performance is not FDA approved in patients less than 62 years old. Performed at [facility], [place] [address], [place], [place] [address]     Radiology Studies: DG Foot 2 Views Left  Result Date: 08/26/2021 CLINICAL DATA:  Limited views due to patient condition. Redness up left leg with swelling and edema. EXAM: LEFT FOOT - 2 VIEW COMPARISON:  X-ray left foot 05/27/2019. FINDINGS: Transmetatarsal amputation of the second digit. No cortical erosion or destruction. There is no evidence of fracture or dislocation. There is no evidence of arthropathy or other focal bone abnormality. Severe soft tissue edema. Vascular calcifications. IMPRESSION: 1. Transmetatarsal amputation of the second digit. 2. No radiographic findings to suggest osteomyelitis. 3.  No acute displaced fracture or dislocation. Electronically Signed   By: Iven Finn M.D.   On: 08/26/2021 17:16   DG Foot 2 Views Right  Result Date: 08/26/2021 CLINICAL DATA:   Limited views due to patient condition. Redness up left leg with swelling and edema. EXAM: RIGHT FOOT - 2 VIEW COMPARISON:  X-ray right foot 09/17/2017 FINDINGS: Limited evaluation due to overlapping osseous structures and overlying soft tissues. Transmetatarsal amputation of the first digit. No cortical erosion or destruction. There is no evidence of fracture or dislocation. There is no evidence of arthropathy or other focal bone abnormality. Subcutaneus soft tissue edema and emphysema along the first digit. Vascular calcifications. IMPRESSION: 1. Transmetatarsal amputation of the first digit. 2. Subcutaneus soft tissue edema and emphysema along the first digit. No definite radiographic finding of underlying osteomyelitis. Limited evaluation due to overlapping osseous structures and overlying soft tissues. 3.  No acute displaced fracture or dislocation. Electronically Signed   By: Iven Finn M.D.   On: 08/26/2021 17:18   VAS [place] LOWER EXTREMITY VENOUS (DVT)  Result Date: 08/26/2021  Lower Venous DVT Study Patient Name:  AVON MERGENTHALER  Date of Exam:   08/26/2021 Medical Rec #: [id number]           Accession #:    [id number] Date of Birth: 1951/07/16  Patient Gender: M Patient Age:   68 years Exam Location:  [facility] Procedure:      VAS [place] LOWER EXTREMITY VENOUS (DVT) Referring Phys: Sheppard Coil MELVIN --------------------------------------------------------------------------------  Indications: Edema.  Risk Factors: None identified. Limitations: Poor ultrasound/tissue interface, open wound and patient pain tolerance, patient positioning, poor patient cooperation. Comparison Study: No prior studies. Performing Technologist: Oliver Hum RVT  Examination Guidelines: A complete evaluation includes B-mode imaging, spectral Doppler, color Doppler, and power Doppler as needed of all accessible portions of each vessel. Bilateral testing is considered an integral part of a complete  examination. Limited examinations for reoccurring indications may be performed as noted. The reflux portion of the exam is performed with the patient in reverse Trendelenburg.  +-----+---------------+---------+-----------+----------+--------------+  RIGHT Compressibility Phasicity Spontaneity Properties Thrombus Aging  +-----+---------------+---------+-----------+----------+--------------+  CFV   Full            Yes       Yes                                    +-----+---------------+---------+-----------+----------+--------------+   +---------+---------------+---------+-----------+----------+-------------------+  LEFT      Compressibility Phasicity Spontaneity Properties Thrombus Aging       +---------+---------------+---------+-----------+----------+-------------------+  CFV       Full            Yes       Yes                                         +---------+---------------+---------+-----------+----------+-------------------+  SFJ       Full                                                                  +---------+---------------+---------+-----------+----------+-------------------+  FV Prox   Full                                                                  +---------+---------------+---------+-----------+----------+-------------------+  FV Mid    Full                                                                  +---------+---------------+---------+-----------+----------+-------------------+  FV Distal Full                                                                  +---------+---------------+---------+-----------+----------+-------------------+  PFV       Full                                                                  +---------+---------------+---------+-----------+----------+-------------------+  POP       Full            Yes       Yes                                         +---------+---------------+---------+-----------+----------+-------------------+  PTV                                                         Patency shown with                                                               color doppler        +---------+---------------+---------+-----------+----------+-------------------+  PERO                                                       Patency shown with                                                               color doppler        +---------+---------------+---------+-----------+----------+-------------------+     Summary: RIGHT: - No evidence of common femoral vein obstruction.  LEFT: - There is no evidence of deep vein thrombosis in the lower extremity. However, portions of this examination were limited- see technologist comments above.  - No cystic structure found in the popliteal fossa.  *See table(s) above for measurements and observations. Electronically signed by Jamelle Haring on 08/26/2021 at 2:48:12 PM.    Final      Oscar La, MD [facility] If 7PM-7AM, please contact night-coverage [email or web address] 08/27/2021, 10:12 AM

## 2021-08-28 LAB — GLUCOSE, CAPILLARY
Glucose-Capillary: 144 mg/dL — ABNORMAL HIGH (ref 70–99)
Glucose-Capillary: 158 mg/dL — ABNORMAL HIGH (ref 70–99)
Glucose-Capillary: 162 mg/dL — ABNORMAL HIGH (ref 70–99)
Glucose-Capillary: 168 mg/dL — ABNORMAL HIGH (ref 70–99)
Glucose-Capillary: 170 mg/dL — ABNORMAL HIGH (ref 70–99)

## 2021-08-28 LAB — CBC
HCT: 34.6 % — ABNORMAL LOW (ref 39.0–52.0)
Hemoglobin: 11.1 g/dL — ABNORMAL LOW (ref 13.0–17.0)
MCH: 29.3 pg (ref 26.0–34.0)
MCHC: 32.1 g/dL (ref 30.0–36.0)
MCV: 91.3 fL (ref 80.0–100.0)
Platelets: 342 10*3/uL (ref 150–400)
RBC: 3.79 MIL/uL — ABNORMAL LOW (ref 4.22–5.81)
RDW: 13.9 % (ref 11.5–15.5)
WBC: 17.8 10*3/uL — ABNORMAL HIGH (ref 4.0–10.5)
nRBC: 0 % (ref 0.0–0.2)

## 2021-08-28 LAB — BASIC METABOLIC PANEL
Anion gap: 6 (ref 5–15)
BUN: 23 mg/dL (ref 8–23)
CO2: 23 mmol/L (ref 22–32)
Calcium: 7.6 mg/dL — ABNORMAL LOW (ref 8.9–10.3)
Chloride: 111 mmol/L (ref 98–111)
Creatinine, Ser: 0.97 mg/dL (ref 0.61–1.24)
GFR, Estimated: >60 mL/min (ref >60)
Glucose, Bld: 176 mg/dL — ABNORMAL HIGH (ref 70–99)
Potassium: 3.7 mmol/L (ref 3.5–5.1)
Sodium: 140 mmol/L (ref 135–145)

## 2021-08-28 LAB — MAGNESIUM: Magnesium: 2.2 mg/dL (ref 1.7–2.4)

## 2021-08-28 LAB — CREATININE, SERUM
Creatinine, Ser: 1.02 mg/dL (ref 0.61–1.24)
GFR, Estimated: >60 mL/min (ref >60)

## 2021-08-28 MED ORDER — ALBUTEROL SULFATE (2.5 MG/3ML) 0.083% IN NEBU
2.5000 mg | INHALATION_SOLUTION | RESPIRATORY_TRACT | Status: DC | PRN
  Administered 2021-08-28: 2.5 mg via RESPIRATORY_TRACT

## 2021-08-28 NOTE — Progress Notes (Signed)
Speech Language Pathology Treatment: Dysphagia  Patient Details Name: Bryan Farrell MRN: [id number] DOB: 02-Jan-1951 Today's Date: 08/28/2021 Time: [id number] SLP Time Calculation (min) (ACUTE ONLY): 15 min  Assessment / Plan / Recommendation Clinical Impression  Patient seen for skilled ST session focused on dysphagia goals. SLP spoke briefly with RN prior to entering room and she did state that patient seems to have more wheezing after consuming liquids. Patient was awake and alert, minimally engaged but agreeable to consuming nectar thick liquids via straw sips. He did exhibit audible wheezing during PO intake but swallow initiation appeared timely, no changes in patient's vocal quality and no coughing or throat clearing noted. He did appear congested nasally. SLP is recommending to continue with current diet restrictions and will plan to proceed with MBS next date if patient not exhibiting any improvement.   HPI HPI: 71 y.o. male admitted from [facility] with sepsis due to left leg cellulitis.  Influenza A +. PMHx dementia, diabetes, history of left toe osteo and amputation, adjustment disorder, depression. Pt has been coughing with PO intake, precipitating swallowing consult.      SLP Plan  Continue with current plan of care      Recommendations for follow up therapy are one component of a multi-disciplinary discharge planning process, led by the attending physician.  Recommendations may be updated based on patient status, additional functional criteria and insurance authorization.    Recommendations  Diet recommendations: Regular;Nectar-thick liquid Liquids provided via: Cup;Straw Medication Administration: Whole meds with puree Supervision: Patient able to self feed;Full supervision/cueing for compensatory strategies Compensations: Slow rate;Small sips/bites Postural Changes and/or Swallow Maneuvers: Seated upright 90 degrees                Oral Care  Recommendations: Oral care BID Follow Up Recommendations: Other (comment) (TBD) Assistance recommended at discharge: Frequent or constant Supervision/Assistance SLP Visit Diagnosis: Dysphagia, unspecified (R13.10) Plan: Continue with current plan of care          Sonia Baller, MA, CCC-SLP Speech Therapy

## 2021-08-28 NOTE — Consult Note (Signed)
Palliative care consult note  Reason for consult: Goals of care in light of severe cellulitis with sepsis  Palliative care consult received.  Chart reviewed including personal review of pertinent labs and imaging.   Briefly, 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  He was admitted with severe cellulitis with sepsis.  Noted to have temperature of 102 and leukocytosis of greater than 20.  He was started on fluid boluses and treated with antibiotics.  Additionally tested positive for influenza.  Since admission, sepsis parameters improving.  Palliative consulted for goals of care.  I saw and examined [name] today.  He was lying in bed in no distress at time of my encounter.  I attempted to talk with him about his understanding of the situation.  He is awake and alert.  He reports pain in his foot but denies all other symptoms when asked.  I attempted to talk with him about his situation prior to the hospital, but this seems to frustrate him.  I am not sure if he cannot remember what is going on or he is just frustrated and that is why he refuses to participate in conversation regarding his current situation or any questions regarding his goals of care.  States, "I wish you would leave me alone to rest."  Became more agitated with questions after this and so I told him I would check in on him again tomorrow.  I attempted to call his son but did not reach him this evening.  -Full code/full scope -Attempted to meet with [name] to discuss goals of care.  While he is awake and alert, I agree with Dr. Tyson Babinski that he does not seem to have insight into his situation. -Attempted to call family today without success.  We will plan to follow-up again tomorrow.  Total time: 35 minutes  Romie Minus,  MD [facility] Palliative Medicine Team [phone]

## 2021-08-28 NOTE — Progress Notes (Signed)
PROGRESS NOTE  Bryan Farrell DXA:[id number] DOB: 11/14/1950   PCP: Garwin Brothers, MD  Brief Narrative:  71 year old Male with past medical history of vascular dementia, diabetes mellitus type 2,  remote left great toe osteomyelitis s/p amputation and depression was brought into the hospital from nursing home with increasing left lower extremity redness, swelling.  Patient was then admitted to the hospital for severe cellulitis and sepsis.  Initial temperature was elevated at 102.6 F with leukocytosis at 20K.  Patient did have significant lactic acidosis.  Blood cultures were obtained patient patient received IV fluid boluses and was initially admitted to the stepdown unit.  Patient also tested positive for influenza.    Assessment & Plan:  Severe sepsis due to LLE cellulitis:  Present on admission.  Received multiple fluid boluses.  Significantly elevated lactate on presentation which has improved with IV fluid hydration.  Lower extremity ultrasound was negative for DVT.  ESR elevated.  Blood cultures negative so far.  X-ray of the left foot with subcutaneous soft tissue edema and emphysema with no definite radiographic evidence of osteomyelitis.  We will continue with broad-spectrum antibiotic for now.  Influenza A infection:  Continue Tamiflu.    Lactic acidosis:  Likely secondary to infection and volume depletion.  Initial lactate of 8.3.  Has normalized to 1.2.  Hypotension:  Continue to hold antihypertensive agents.  Received IV fluids.  A.m. cortisol was within normal limits blood pressure has improved at this time.  Has improved at this time.  Latest blood pressure 133/78   AKI/azotemia:  Improved with IV hydration.  Monitor BMP closely.  Latest creatinine of 1.0.  Hypokalemia:  Improved.  Latest potassium of 3.7.  Uncontrolled NIDDM-2 with hyperglycemia, diabetic neuropathy:  Latest A1c 9.0%.  Continue sliding scale insulin.  Continue gabapentin and Lipitor.  POC glucose of  170  Normocytic anemia:  Mild drop in hemoglobin at 11.1.  Monitor closely.   Vascular dementia with behavioral disturbance/history of depression and adjustment disorder: Continue Depakote, Wellbutrin and Zoloft.  Likely at baseline.    Physical deconditioning/debility Seen by physical, occupational herapy evaluation recommended skilled nursing facility placement on discharge.   Concern about dysphagia: Speech therapy on board currently recommending regular consistency diet  Disposition.  Patient is from skilled nursing facility. Likely disposition plan to skilled nursing facility when improved.  DVT prophylaxis:  enoxaparin (LOVENOX) injection 40 mg Start: 08/25/21 2200  Code Status: Full code  Family Communication:  Spoke with the patient's son on the phone on 08/27/2021   Status is: Inpatient  Remains inpatient appropriate because: Severe sepsis due to LLE cellulitis, requiring IV antibiotics and further evaluation  Consultants:  None   Procedures:  None  Sch Meds:  Scheduled Meds:  atorvastatin  40 mg Oral Daily   buPROPion  75 mg Oral BID   chlorhexidine  15 mL Mouth Rinse BID   Chlorhexidine Gluconate Cloth  6 each Topical Daily   divalproex  125 mg Oral QHS   enoxaparin (LOVENOX) injection  40 mg Subcutaneous Q24H   feeding supplement  237 mL Oral BID BM   gabapentin  100 mg Oral QHS   Gerhardt's butt cream   Topical TID   hydrocerin   Topical BID   insulin aspart  0-15 Units Subcutaneous TID WC   insulin aspart  0-5 Units Subcutaneous QHS   mouth rinse  15 mL Mouth Rinse q12n4p   midodrine  5 mg Oral TID WC   multivitamin with minerals  1 tablet  Oral Daily   oseltamivir  75 mg Oral BID   sertraline  200 mg Oral Daily   sodium chloride flush  3 mL Intravenous Q12H   Continuous Infusions:  0.9 % NaCl with KCl 20 mEq / L 100 mL/hr at 08/27/21 1151   piperacillin-tazobactam (ZOSYN)  IV 3.375 g (08/28/21 0522)   vancomycin 1,250 mg (08/28/21 0604)   PRN  Meds:.acetaminophen **OR** acetaminophen, fentaNYL (SUBLIMAZE) injection, food thickener, ondansetron **OR** ondansetron (ZOFRAN) IV, oxyCODONE, polyethylene glycol  Antimicrobials:  Vancomycin, Zosyn 1/12>   Anti-infectives (From admission, onward)    Start     Dose/Rate Route Frequency Ordered Stop   08/26/21 2100  piperacillin-tazobactam (ZOSYN) IVPB 3.375 g        3.375 g 12.5 mL/hr over 240 Minutes Intravenous Every 8 hours 08/26/21 1443     08/26/21 1600  vancomycin (VANCOREADY) IVPB 1250 mg/250 mL        1,250 mg 166.7 mL/hr over 90 Minutes Intravenous 2 times daily 08/26/21 1507     08/26/21 1000  oseltamivir (TAMIFLU) capsule 30 mg  Status:  Discontinued       See Hyperspace for full Linked Orders Report.   30 mg Oral 2 times daily 08/25/21 1936 08/26/21 0723   08/26/21 1000  oseltamivir (TAMIFLU) capsule 75 mg  Status:  Discontinued       See Hyperspace for full Linked Orders Report.   75 mg Oral 2 times daily 08/26/21 0723 08/26/21 0724   08/26/21 1000  oseltamivir (TAMIFLU) capsule 75 mg        75 mg Oral 2 times daily 08/26/21 0724 08/31/21 0959   08/25/21 2100  vancomycin (VANCOREADY) IVPB 1750 mg/350 mL  Status:  Discontinued        1,750 mg 175 mL/hr over 120 Minutes Intravenous Every 24 hours 08/25/21 1942 08/26/21 1507   08/25/21 2000  metroNIDAZOLE (FLAGYL) IVPB 500 mg  Status:  Discontinued        500 mg 100 mL/hr over 60 Minutes Intravenous Every 12 hours 08/25/21 1932 08/26/21 1443   08/25/21 2000  ceFEPIme (MAXIPIME) 2 g in sodium chloride 0.9 % 100 mL IVPB  Status:  Discontinued        2 g 200 mL/hr over 30 Minutes Intravenous Every 8 hours 08/25/21 1942 08/26/21 1443   08/25/21 1945  oseltamivir (TAMIFLU) capsule 75 mg  Status:  Discontinued       See Hyperspace for full Linked Orders Report.   75 mg Oral  Once 08/25/21 1936 08/26/21 0724   08/25/21 1545  cefTRIAXone (ROCEPHIN) 2 g in sodium chloride 0.9 % 100 mL IVPB  Status:  Discontinued        2 g 200  mL/hr over 30 Minutes Intravenous Every 24 hours 08/25/21 1535 08/25/21 1932     Subjective:  Today, patient was seen and examined at bedside.  Patient does not really want to talk and is more sleepy today.  Complains of mild cough.  Denies any pain, nausea, vomiting.  Objective: Vitals:   08/27/21 1931 08/27/21 2000 08/27/21 2337 08/28/21 0554  BP: 122/64 (!) 98/53 (!) 148/81 133/78  Pulse: 78 77 83 82  Resp: 18 (!) 27 (!) 24 15  Temp: 98.5 F (36.9 C)  99.8 F (37.7 C) 98.8 F (37.1 C)  TempSrc: Oral  Oral Oral  SpO2: 94% 94% 97% 93%  Weight:      Height:        Physical examination: General:  Average built, not in  obvious distress, appears to be mildly somnolent today. HENT:   No scleral pallor or icterus noted. Oral mucosa is moist.  Chest:    Diminished breath sounds bilaterally.  CVS: S1 &S2 heard. No murmur.  Regular rate and rhythm. Abdomen: Soft, nontender, nondistended.  Bowel sounds are heard.   Extremities: Status post left great toe amputation, erythema, edema over the left foot.  Status post amputated first toe on the right with second toe discoloration. Psych: Mildly somnolent but communicative.    CNS:  No cranial nerve deficits.  Power equal in all extremities.   Skin: Warm and dry.  Foot cellulitis.  I have personally reviewed the following labs and imaging studies.  CBC: Recent Labs  Lab 08/25/21 1520 08/26/21 0532 08/27/21 0307  WBC 21.6* 16.8* 16.8*  NEUTROABS 19.5*  --   --   HGB 13.9 11.0* 11.1*  HCT 41.5 32.8* 34.5*  MCV 87.9 88.4 90.6  PLT 281 210 250    BMP &GFR Recent Labs  Lab 08/25/21 1520 08/26/21 0532 08/27/21 0307 08/28/21 0444  NA 137 136 139  --   K 3.3* 3.1* 3.7  --   CL 101 101 109  --   CO2 _0 --   GLUCOSE 223* 165* 110*  --   BUN 31* 25* 26*  --   CREATININE 1.33* 1.02 1.08   1.11 1.02  CALCIUM 8.2* 7.6* 7.5*  --   MG 2.0 1.7 2.1  --   PHOS  --   --  2.6  --     Estimated Creatinine Clearance: 82.5  mL/min (by C-G formula based on SCr of 1.02 mg/dL). Liver & Pancreas: Recent Labs  Lab 08/25/21 1520 08/26/21 0532 08/27/21 0307  AST 50* 40  --   ALT 32 25  --   ALKPHOS 78 60  --   BILITOT 1.2 0.9  --   PROT 7.6 5.5*  --   ALBUMIN 3.0* 2.1* 2.1*    No results for input(s): LIPASE, AMYLASE in the last 168 hours. No results for input(s): AMMONIA in the last 168 hours. Diabetic: No results for input(s): HGBA1C in the last 72 hours. Recent Labs  Lab 08/27/21 0756 08/27/21 1129 08/27/21 1653 08/27/21 2147 08/27/21 2345  GLUCAP 101* 157* 243* 178* 170*    Cardiac Enzymes: Recent Labs  Lab 08/27/21 0307  CKTOTAL 245    No results for input(s): PROBNP in the last 8760 hours. Coagulation Profile: Recent Labs  Lab 08/25/21 1520 08/26/21 0532  INR 1.1 1.3*    Thyroid Function Tests: No results for input(s): TSH, T4TOTAL, FREET4, T3FREE, THYROIDAB in the last 72 hours. Lipid Profile: No results for input(s): CHOL, HDL, LDLCALC, TRIG, CHOLHDL, LDLDIRECT in the last 72 hours. Anemia Panel: No results for input(s): VITAMINB12, FOLATE, FERRITIN, TIBC, IRON, RETICCTPCT in the last 72 hours. Urine analysis:    Component Value Date/Time   COLORURINE AMBER (A) 08/25/2021 1833   APPEARANCEUR HAZY (A) 08/25/2021 1833   LABSPEC 1.024 08/25/2021 1833   PHURINE 5.0 08/25/2021 1833   GLUCOSEU >=500 (A) 08/25/2021 1833   HGBUR MODERATE (A) 08/25/2021 1833   BILIRUBINUR NEGATIVE 08/25/2021 1833   KETONESUR 5 (A) 08/25/2021 1833   PROTEINUR >=300 (A) 08/25/2021 1833   UROBILINOGEN 1.0 04/20/2010 1105   NITRITE NEGATIVE 08/25/2021 1833   LEUKOCYTESUR NEGATIVE 08/25/2021 1833   Sepsis Labs: Invalid input(s): PROCALCITONIN, [place]  Microbiology: Recent Results (from the past 240 hour(s))  Blood Culture (routine x 2)  Status: None (Preliminary result)   Collection Time: 08/25/21  3:20 PM   Specimen: BLOOD  Result Value Ref Range Status   Specimen Description    Final    BLOOD RIGHT ANTECUBITAL Performed at [facility] [address] [place], [place] [address]    Special Requests   Final    BOTTLES DRAWN AEROBIC AND ANAEROBIC Blood Culture results may not be optimal due to an excessive volume of blood received in culture bottles Performed at [facility] [address] [place], [place] [address]    Culture   Final    NO GROWTH 2 DAYS Performed at [facility] [address] [place], [place] [address]    Report Status PENDING  Incomplete  Blood Culture (routine x 2)     Status: None (Preliminary result)   Collection Time: 08/25/21  3:20 PM   Specimen: BLOOD  Result Value Ref Range Status   Specimen Description   Final    BLOOD LEFT ANTECUBITAL Performed at [facility] [address] [place], [place] [address]    Special Requests   Final    BOTTLES DRAWN AEROBIC AND ANAEROBIC Blood Culture results may not be optimal due to an excessive volume of blood received in culture bottles Performed at [facility] [address], [place], [place] [address]    Culture   Final    NO GROWTH 2 DAYS Performed at [facility] [address], [place], [place] [address]    Report Status PENDING  Incomplete  Resp Panel by RT-PCR (Flu A&B, Covid) Nasopharyngeal Swab     Status: Abnormal   Collection Time: 08/25/21  3:30 PM   Specimen: Nasopharyngeal Swab; Nasopharyngeal(NP) swabs in vial transport medium  Result Value Ref Range Status   SARS Coronavirus 2 by RT PCR NEGATIVE NEGATIVE Final    Comment: (NOTE) SARS-CoV-2 target nucleic acids are NOT DETECTED.  The SARS-CoV-2 RNA is generally detectable in upper respiratory specimens during the acute phase of infection. The lowest concentration of SARS-CoV-2 viral copies this assay can detect is 138 copies/mL. A negative result does not preclude SARS-Cov-2 infection and should not be used as the sole basis for  treatment or other patient management decisions. A negative result may occur with  improper specimen collection/handling, submission of specimen other than nasopharyngeal swab, presence of viral mutation(s) within the areas targeted by this assay, and inadequate number of viral copies(<138 copies/mL). A negative result must be combined with clinical observations, patient history, and epidemiological information. The expected result is Negative.  Fact Sheet for Patients:  EntrepreneurPulse.com.au  Fact Sheet for Healthcare Providers:  IncredibleEmployment.be  This test is no t yet approved or cleared by the [place] FDA and  has been authorized for detection and/or diagnosis of SARS-CoV-2 by FDA under an Emergency Use Authorization (EUA). This EUA will remain  in effect (meaning this test can be used) for the duration of the COVID-19 declaration under Section 564(b)(1) of the Act, 21 U.S.C.section 360bbb-3(b)(1), unless the authorization is terminated  or revoked sooner.       Influenza A by PCR POSITIVE (A) NEGATIVE Final   Influenza B by PCR NEGATIVE NEGATIVE Final    Comment: (NOTE) The Xpert Xpress SARS-CoV-2/FLU/RSV plus assay is intended as an aid in the diagnosis of influenza from Nasopharyngeal swab specimens and should not be used as a sole basis for treatment. Nasal washings and aspirates are unacceptable for Xpert Xpress  SARS-CoV-2/FLU/RSV testing.  Fact Sheet for Patients: EntrepreneurPulse.com.au  Fact Sheet for Healthcare Providers: IncredibleEmployment.be  This test is not yet approved or cleared by the [place] FDA and has been authorized for detection and/or diagnosis of SARS-CoV-2 by FDA under an Emergency Use Authorization (EUA). This EUA will remain in effect (meaning this test can be used) for the duration of the COVID-19 declaration under Section 564(b)(1) of the Act, 21  U.S.C. section 360bbb-3(b)(1), unless the authorization is terminated or revoked.  Performed at [facility], [place] [address] [place], [place] [address]   Urine Culture     Status: None   Collection Time: 08/25/21  6:33 PM   Specimen: In/Out Cath Urine  Result Value Ref Range Status   Specimen Description   Final    IN/OUT CATH URINE Performed at [facility] [address] [place], [place] [address]    Special Requests   Final    NONE Performed at [facility], [place] [address] [place], [place] [address]    Culture   Final    NO GROWTH Performed at [facility], [place] [address], [place], [place] [address]    Report Status 08/26/2021 FINAL  Final  MRSA Next Gen by PCR, Nasal     Status: None   Collection Time: 08/25/21 11:20 PM   Specimen: Nasal Mucosa; Nasal Swab  Result Value Ref Range Status   MRSA by PCR Next Gen NOT DETECTED NOT DETECTED Final    Comment: (NOTE) The GeneXpert MRSA Assay (FDA approved for NASAL specimens only), is one component of a comprehensive MRSA colonization surveillance program. It is not intended to diagnose MRSA infection nor to guide or monitor treatment for MRSA infections. Test performance is not FDA approved in patients less than 58 years old. Performed at [facility], [place] [address], [place], [place] [address]     Radiology Studies: No results found.   Oscar La, MD [facility] If 7PM-7AM, please contact night-coverage [email or web address] 08/28/2021, 7:45 AM

## 2021-08-29 LAB — COMPREHENSIVE METABOLIC PANEL
ALT: 21 U/L (ref 0–44)
AST: 33 U/L (ref 15–41)
Albumin: 1.8 g/dL — ABNORMAL LOW (ref 3.5–5.0)
Alkaline Phosphatase: 83 U/L (ref 38–126)
Anion gap: 7 (ref 5–15)
BUN: 20 mg/dL (ref 8–23)
CO2: 24 mmol/L (ref 22–32)
Calcium: 7.8 mg/dL — ABNORMAL LOW (ref 8.9–10.3)
Chloride: 109 mmol/L (ref 98–111)
Creatinine, Ser: 1.06 mg/dL (ref 0.61–1.24)
GFR, Estimated: >60 mL/min (ref >60)
Glucose, Bld: 171 mg/dL — ABNORMAL HIGH (ref 70–99)
Potassium: 3.6 mmol/L (ref 3.5–5.1)
Sodium: 140 mmol/L (ref 135–145)
Total Bilirubin: 0.7 mg/dL (ref 0.3–1.2)
Total Protein: 5.6 g/dL — ABNORMAL LOW (ref 6.5–8.1)

## 2021-08-29 LAB — CBC
HCT: 32.2 % — ABNORMAL LOW (ref 39.0–52.0)
Hemoglobin: 10.4 g/dL — ABNORMAL LOW (ref 13.0–17.0)
MCH: 29.1 pg (ref 26.0–34.0)
MCHC: 32.3 g/dL (ref 30.0–36.0)
MCV: 90.2 fL (ref 80.0–100.0)
Platelets: 370 10*3/uL (ref 150–400)
RBC: 3.57 MIL/uL — ABNORMAL LOW (ref 4.22–5.81)
RDW: 14.1 % (ref 11.5–15.5)
WBC: 18.1 10*3/uL — ABNORMAL HIGH (ref 4.0–10.5)
nRBC: 0 % (ref 0.0–0.2)

## 2021-08-29 LAB — GLUCOSE, CAPILLARY
Glucose-Capillary: 171 mg/dL — ABNORMAL HIGH (ref 70–99)
Glucose-Capillary: 185 mg/dL — ABNORMAL HIGH (ref 70–99)
Glucose-Capillary: 165 mg/dL — ABNORMAL HIGH (ref 70–99)
Glucose-Capillary: 153 mg/dL — ABNORMAL HIGH (ref 70–99)

## 2021-08-29 LAB — MAGNESIUM: Magnesium: 2 mg/dL (ref 1.7–2.4)

## 2021-08-29 MED ORDER — OXYCODONE HCL 5 MG PO TABS
5.0000 mg | ORAL_TABLET | Freq: Four times a day (QID) | ORAL | Status: DC | PRN
  Administered 2021-08-29: 5 mg via ORAL
  Filled 2021-08-29 (×2): qty 1

## 2021-08-29 MED ORDER — GABAPENTIN 100 MG PO CAPS
100.0000 mg | ORAL_CAPSULE | Freq: Three times a day (TID) | ORAL | Status: DC
  Administered 2021-08-29 – 2021-09-02 (×8): 100 mg via ORAL
  Filled 2021-08-29 (×11): qty 1

## 2021-08-29 NOTE — Progress Notes (Signed)
Speech Language Pathology Treatment: Dysphagia  Patient Details Name: Bryan Farrell MRN: [id number] DOB: [birth year] Today's Date: 08/29/2021 Time: 0850-0905 SLP Time Calculation (min) (ACUTE ONLY): 15 min  Assessment / Plan / Recommendation Clinical Impression  Patient seen for skilled ST session focusing on dysphagia goals. Per patient's RN, he has been wheezing but refused breathing treatment and she feels he needs some emotional support. When SLP entered patient's room, he was audibly wheezing but did not exhibit nasal congestion as was observed during yesteday's session. SLP put nasal cannula back in position as it had come out of his nostrils. Patient not making any requests and gave mainly monotone one-word responses. He did seem to perk up a little when SLP asked if he wanted a coke. SLP observed him take straw sips of coke (thin consistency). He did not exhibit any overt s/s of aspiration or penetration; no coughing or throat clearing; voice remained clear and strong; no changes in respirations and wheezing intensity did not change as compared to before PO intake. SLP upgraded patient's liquids to thin (from nectar thick). Although MBS is still in consideration, will hold off on this as patient continues with pain with agitation and reported refusals of medical treatment, refusals and agitation with attempts to transfer/move patient. SLP will continue to follow patient to determine if need/benefit from objective swallow study.   HPI HPI: 71 y.o. male admitted from [facility] with sepsis due to left leg cellulitis.  Influenza A +. PMHx dementia, diabetes, history of left toe osteo and amputation, adjustment disorder, depression. Pt has been coughing with PO intake, precipitating swallowing consult.      SLP Plan  Continue with current plan of care      Recommendations for follow up therapy are one component of a multi-disciplinary discharge planning process, led by the  attending physician.  Recommendations may be updated based on patient status, additional functional criteria and insurance authorization.    Recommendations  Diet recommendations: Thin liquid;Regular Liquids provided via: Cup;Straw Medication Administration: Whole meds with puree Supervision: Patient able to self feed;Full supervision/cueing for compensatory strategies Compensations: Slow rate;Small sips/bites Postural Changes and/or Swallow Maneuvers: Seated upright 90 degrees                Oral Care Recommendations: Oral care BID Follow Up Recommendations: Other (comment) Assistance recommended at discharge: Frequent or constant Supervision/Assistance SLP Visit Diagnosis: Dysphagia, unspecified (R13.10) Plan: Continue with current plan of care         Sonia Baller, MA, CCC-SLP Speech Therapy

## 2021-08-29 NOTE — TOC Initial Note (Signed)
Transition of Care [facility]) - Initial/Assessment Note    Patient Details  Name: Jefferson Fullam MRN: [id number] Date of Birth: 11-Aug-1951  [facility]) CM/SW Contact:    Darleene Cleaver, LCSW Phone Number: 08/29/2021, 10:08 AM  Clinical Narrative:                  Patient is a LTC resident at [facility].  Patient is alert and oriented x3, per [facility], he has been there for several months.  Patient has some confusion, patient was seen by therapy, they are recommending SNF placement.  CSW spoke to [facility], patient can return once patient is medically ready for discharge.  CSW to continue following patient's progress throughout discharge planning.  Expected Discharge Plan: Skilled Nursing Facility Barriers to Discharge: Continued Medical Work up   Patient Goals and CMS Choice Patient states their goals for this hospitalization and ongoing recovery are:: To return back to SNF where he is a LTC resident. CMS Medicare.gov Compare Post Acute Care list provided to:: Patient Choice offered to / list presented to : Patient  Expected Discharge Plan and Services Expected Discharge Plan: Skilled Nursing Facility     Post Acute Care Choice: Skilled Nursing Facility Living arrangements for the past 2 months: Skilled Nursing Facility                                      Prior Living Arrangements/Services Living arrangements for the past 2 months: Skilled Nursing Facility Lives with:: Facility Resident Patient language and need for interpreter reviewed:: Yes Do you feel safe going back to the place where you live?: Yes      Need for Family Participation in Patient Care: Yes (Comment) Care giver support system in place?: Yes (comment)   Criminal Activity/Legal Involvement Pertinent to Current Situation/Hospitalization: No - Comment as needed  Activities of Daily Living Home Assistive Devices/Equipment: Other (Comment) (pt unable to  verbalize what medical equipment he uses at home) ADL Screening (condition at time of admission) Patient's cognitive ability adequate to safely complete daily activities?: No Is the patient deaf or have difficulty hearing?: No Does the patient have difficulty seeing, even when wearing glasses/contacts?: No Does the patient have difficulty concentrating, remembering, or making decisions?: Yes Patient able to express need for assistance with ADLs?: Yes Does the patient have difficulty dressing or bathing?: Yes Independently performs ADLs?: No Communication: Independent Dressing (OT): Needs assistance Is this a change from baseline?: Pre-admission baseline Grooming: Needs assistance Is this a change from baseline?: Pre-admission baseline Feeding: Independent Bathing: Needs assistance Is this a change from baseline?: Pre-admission baseline Toileting: Needs assistance Is this a change from baseline?: Pre-admission baseline In/Out Bed: Needs assistance Is this a change from baseline?: Pre-admission baseline Walks in Home: Needs assistance Is this a change from baseline?: Pre-admission baseline Does the patient have difficulty walking or climbing stairs?: Yes Weakness of Legs: Both Weakness of Arms/Hands: Both  Permission Sought/Granted Permission sought to share information with : Facility Medical sales representative, Family Supports, Case Manager Permission granted to share information with : Yes, Release of Information Signed  Share Information with NAME: Lorne, Winkels [phone]  [phone]  Seymour Bars Daughter [phone]    Byrd Hesselbach [phone]  [phone]  Permission granted to share info w AGENCY: SNF admissions        Emotional Assessment Appearance:: Appears stated age Attitude/Demeanor/Rapport: Engaged Affect (typically  observed): Accepting, Appropriate, Calm Orientation: : Oriented to Self, Oriented to Place, Oriented to Situation Alcohol / Substance  Use: Not Applicable Psych Involvement: Yes (comment), Outpatient Provider  Admission diagnosis:  Cellulitis [L03.90] Cellulitis of left lower extremity [L03.116] Sepsis due to cellulitis (HCC) [L03.90, A41.9] Patient Active Problem List   Diagnosis Date Noted   Sepsis due to cellulitis (HCC) 08/25/2021   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HCC)    Bacteremia due to Enterococcus 05/29/2019   Toe osteomyelitis, left (HCC)    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 [place] [address] Phone: [phone] Fax: [phone]     Social Determinants of Health (SDOH) Interventions    Readmission Risk Interventions Readmission Risk Prevention Plan 05/28/2019  Transportation Screening Complete  PCP or Specialist Appt within 3-5 Days Not Complete  Not Complete comments Continued medical work-up  HRI or Home Care Consult Complete  Social Work Consult for Recovery Care Planning/Counseling Complete  Palliative Care Screening Not Applicable  Medication Review Oceanographer) Complete  Some recent data might be hidden

## 2021-08-29 NOTE — Progress Notes (Addendum)
Noticed copious serous drainage from left lower leg (changed bed pad two times). Cleansed both legs and applied the ordered cream. Pt got irritable when touching lower legs, esp. LLL.

## 2021-08-29 NOTE — Progress Notes (Addendum)
Pt assessed this morning. Audible wheezing noted. Pt refused breathing treatment. Pt angry and upset, states his family does not want him. Chaplain services paged for paged, pt made aware

## 2021-08-29 NOTE — Progress Notes (Signed)
PROGRESS NOTE  Bryan Farrell VVO:[id number] DOB: 1951/05/10   PCP: Garwin Brothers, MD  Brief Narrative:  71 year old Male with past medical history of vascular dementia, diabetes mellitus type 2,  remote left great toe osteomyelitis s/p amputation and depression was brought into the hospital from nursing home with increasing left lower extremity redness, swelling.  Patient was then admitted to the hospital for severe cellulitis and sepsis.  Initial temperature was elevated at 102.6 F with leukocytosis at 20K.  Patient did have significant lactic acidosis.  Blood cultures were obtained patient patient received IV fluid boluses and was initially admitted to the stepdown unit.  Patient also tested positive for influenza.    Assessment & Plan:  Severe sepsis due to LLE cellulitis:  Present on admission.  Received multiple fluid boluses.  Significantly elevated lactate on presentation which has improved with IV fluid hydration.  Lower extremity ultrasound was negative for DVT.  ESR was elevated.  Blood cultures negative so far in 4 days.  X-ray of the left foot with subcutaneous soft tissue edema and emphysema with no definite radiographic evidence of osteomyelitis.  We will continue with broad-spectrum antibiotic for now.  Patient still has significant cellulitis and elevated WBC despite being on antibiotic.  We will reach out to orthopedic surgery  dr Sharol Given today.  WBC elevated at 18.1 T-max of 99 F.  Influenza A infection:  Continue Tamiflu.    Lactic acidosis:  Likely secondary to infection and volume depletion.  Initial lactate of 8.3.  Has normalized to 1.2.  Hypotension:  Continue to hold antihypertensive agents.  Received IV fluids.  A.m. cortisol was within normal limits blood pressure has improved at this time.    AKI/azotemia:  Improved with IV hydration.  Monitor BMP closely.  Latest creatinine of 1.0.  Hypokalemia:  Improved.  Latest potassium of 3.6.  Uncontrolled NIDDM-2  with hyperglycemia, diabetic neuropathy:  Latest A1c 9.0%.  Continue sliding scale insulin.  Continue gabapentin and Lipitor.  POC glucose of 171  Normocytic anemia:  hemoglobin at 10.4.  Continue to monitor closely.   Vascular dementia with behavioral disturbance/history of depression and adjustment disorder: Continue Depakote, Wellbutrin and Zoloft.  Likely at baseline.    Physical deconditioning/debility Seen by physical, occupational herapy evaluation recommended skilled nursing facility placement on discharge.   Concern about dysphagia: Speech therapy on board currently recommending regular consistency diet  Disposition.  Patient is from skilled nursing facility. Likely disposition plan to skilled nursing facility when improved.  DVT prophylaxis:  enoxaparin (LOVENOX) injection 40 mg Start: 08/25/21 2200  Code Status: Full code  Family Communication:  Spoke with the patient's son on the phone on 08/27/2021   Status is: Inpatient  Remains inpatient appropriate because: Severe sepsis due to LLE cellulitis, requiring IV antibiotics and further evaluation  Consultants:  We will consult orthopedics Dr Sharol Given  Procedures:  None  Sch Meds:  Scheduled Meds:  atorvastatin  40 mg Oral Daily   buPROPion  75 mg Oral BID   chlorhexidine  15 mL Mouth Rinse BID   Chlorhexidine Gluconate Cloth  6 each Topical Daily   divalproex  125 mg Oral QHS   enoxaparin (LOVENOX) injection  40 mg Subcutaneous Q24H   feeding supplement  237 mL Oral BID BM   gabapentin  100 mg Oral QHS   Gerhardt's butt cream   Topical TID   hydrocerin   Topical BID   insulin aspart  0-15 Units Subcutaneous TID WC   insulin aspart  0-5 Units Subcutaneous QHS   mouth rinse  15 mL Mouth Rinse q12n4p   midodrine  5 mg Oral TID WC   multivitamin with minerals  1 tablet Oral Daily   oseltamivir  75 mg Oral BID   sertraline  200 mg Oral Daily   sodium chloride flush  3 mL Intravenous Q12H   Continuous  Infusions:  piperacillin-tazobactam (ZOSYN)  IV 3.375 g (08/29/21 0504)   PRN Meds:.acetaminophen **OR** acetaminophen, albuterol, fentaNYL (SUBLIMAZE) injection, food thickener, ondansetron **OR** ondansetron (ZOFRAN) IV, oxyCODONE, polyethylene glycol  Antimicrobials:  Zosyn.  Anti-infectives (From admission, onward)    Start     Dose/Rate Route Frequency Ordered Stop   08/26/21 2100  piperacillin-tazobactam (ZOSYN) IVPB 3.375 g        3.375 g 12.5 mL/hr over 240 Minutes Intravenous Every 8 hours 08/26/21 1443     08/26/21 1600  vancomycin (VANCOREADY) IVPB 1250 mg/250 mL  Status:  Discontinued        1,250 mg 166.7 mL/hr over 90 Minutes Intravenous 2 times daily 08/26/21 1507 08/28/21 1348   08/26/21 1000  oseltamivir (TAMIFLU) capsule 30 mg  Status:  Discontinued       See Hyperspace for full Linked Orders Report.   30 mg Oral 2 times daily 08/25/21 1936 08/26/21 0723   08/26/21 1000  oseltamivir (TAMIFLU) capsule 75 mg  Status:  Discontinued       See Hyperspace for full Linked Orders Report.   75 mg Oral 2 times daily 08/26/21 0723 08/26/21 0724   08/26/21 1000  oseltamivir (TAMIFLU) capsule 75 mg        75 mg Oral 2 times daily 08/26/21 0724 08/31/21 0959   08/25/21 2100  vancomycin (VANCOREADY) IVPB 1750 mg/350 mL  Status:  Discontinued        1,750 mg 175 mL/hr over 120 Minutes Intravenous Every 24 hours 08/25/21 1942 08/26/21 1507   08/25/21 2000  metroNIDAZOLE (FLAGYL) IVPB 500 mg  Status:  Discontinued        500 mg 100 mL/hr over 60 Minutes Intravenous Every 12 hours 08/25/21 1932 08/26/21 1443   08/25/21 2000  ceFEPIme (MAXIPIME) 2 g in sodium chloride 0.9 % 100 mL IVPB  Status:  Discontinued        2 g 200 mL/hr over 30 Minutes Intravenous Every 8 hours 08/25/21 1942 08/26/21 1443   08/25/21 1945  oseltamivir (TAMIFLU) capsule 75 mg  Status:  Discontinued       See Hyperspace for full Linked Orders Report.   75 mg Oral  Once 08/25/21 1936 08/26/21 0724   08/25/21  1545  cefTRIAXone (ROCEPHIN) 2 g in sodium chloride 0.9 % 100 mL IVPB  Status:  Discontinued        2 g 200 mL/hr over 30 Minutes Intravenous Every 24 hours 08/25/21 1535 08/25/21 1932     Subjective:  Today, patient was seen and examined.  Patient does not really want to talk.  Feels frustrated about his diagnosis.  Complains of left foot pain not helped with current medication.  Denies any fever nausea vomiting chills.  Objective: Vitals:   08/27/21 1931 08/27/21 2000 08/27/21 2337 08/28/21 0554  BP: 122/64 (!) 98/53 (!) 148/81 133/78  Pulse: 78 77 83 82  Resp: 18 (!) 27 (!) 24 15  Temp: 98.5 F (36.9 C)  99.8 F (37.7 C) 98.8 F (37.1 C)  TempSrc: Oral  Oral Oral  SpO2: 94% 94% 97% 93%  Weight:  Height:        Physical examination: General:  Average built, not in obvious distress, closing eyes, does not really want to communicate, feels frustrated. HENT:   No scleral pallor or icterus noted. Oral mucosa is moist.  Chest:    Diminished breath sounds bilaterally. No crackles or wheezes.  CVS: S1 &S2 heard. No murmur.  Regular rate and rhythm. Abdomen: Soft, nontender, nondistended.  Bowel sounds are heard.   Extremities: Status post left great toe amputation, erythema edema over the left foot.  Status post right first toe amputation with a second toe discoloration.  Status post left great toe amputation, erythema edema over the left foot.  Status post amputation of the first great toe with second toe discoloration. Psych: Alert, mildly somnolent but communicative CNS: Patient denies, communicating appropriately but feels frustrated, moves extremities.   Skin: Warm and dry.  Foot cellulitis    I have personally reviewed the following labs and imaging studies.  CBC: Recent Labs  Lab 08/25/21 1520 08/26/21 0532 08/27/21 0307 08/28/21 0909 08/29/21 0416  WBC 21.6* 16.8* 16.8* 17.8* 18.1*  NEUTROABS 19.5*  --   --   --   --   HGB 13.9 11.0* 11.1* 11.1* 10.4*  HCT  41.5 32.8* 34.5* 34.6* 32.2*  MCV 87.9 88.4 90.6 91.3 90.2  PLT 281 210 250 342 370    BMP &GFR Recent Labs  Lab 08/25/21 1520 08/26/21 0532 08/27/21 0307 08/28/21 0444 08/28/21 0909 08/29/21 0416  NA 137 136 139  --  140 140  K 3.3* 3.1* 3.7  --  3.7 3.6  CL 101 101 109  --  111 109  CO2 27 25 23   --  23 24  GLUCOSE 223* 165* 110*  --  176* 171*  BUN 31* 25* 26*  --  23 20  CREATININE 1.33* 1.02 1.08   1.11 1.02 0.97 1.06  CALCIUM 8.2* 7.6* 7.5*  --  7.6* 7.8*  MG 2.0 1.7 2.1  --  2.2 2.0  PHOS  --   --  2.6  --   --   --     Estimated Creatinine Clearance: 79.4 mL/min (by C-G formula based on SCr of 1.06 mg/dL). Liver & Pancreas: Recent Labs  Lab 08/25/21 1520 08/26/21 0532 08/27/21 0307 08/29/21 0416  AST 50* 40  --  33  ALT 32 25  --  21  ALKPHOS 78 60  --  83  BILITOT 1.2 0.9  --  0.7  PROT 7.6 5.5*  --  5.6*  ALBUMIN 3.0* 2.1* 2.1* 1.8*    No results for input(s): LIPASE, AMYLASE in the last 168 hours. No results for input(s): AMMONIA in the last 168 hours. Diabetic: No results for input(s): HGBA1C in the last 72 hours. Recent Labs  Lab 08/28/21 0748 08/28/21 1113 08/28/21 1657 08/28/21 2217 08/29/21 0745  GLUCAP 168* 144* 162* 158* 171*    Cardiac Enzymes: Recent Labs  Lab 08/27/21 0307  CKTOTAL 245    No results for input(s): PROBNP in the last 8760 hours. Coagulation Profile: Recent Labs  Lab 08/25/21 1520 08/26/21 0532  INR 1.1 1.3*    Thyroid Function Tests: No results for input(s): TSH, T4TOTAL, FREET4, T3FREE, THYROIDAB in the last 72 hours. Lipid Profile: No results for input(s): CHOL, HDL, LDLCALC, TRIG, CHOLHDL, LDLDIRECT in the last 72 hours. Anemia Panel: No results for input(s): VITAMINB12, FOLATE, FERRITIN, TIBC, IRON, RETICCTPCT in the last 72 hours. Urine analysis:    Component Value Date/Time  COLORURINE AMBER (A) 08/25/2021 1833   APPEARANCEUR HAZY (A) 08/25/2021 1833   LABSPEC 1.024 08/25/2021 1833   PHURINE  5.0 08/25/2021 1833   GLUCOSEU >=500 (A) 08/25/2021 1833   HGBUR MODERATE (A) 08/25/2021 1833   BILIRUBINUR NEGATIVE 08/25/2021 1833   KETONESUR 5 (A) 08/25/2021 1833   PROTEINUR >=300 (A) 08/25/2021 1833   UROBILINOGEN 1.0 04/20/2010 1105   NITRITE NEGATIVE 08/25/2021 1833   LEUKOCYTESUR NEGATIVE 08/25/2021 1833   Sepsis Labs: Invalid input(s): PROCALCITONIN, [place]  Microbiology: Recent Results (from the past 240 hour(s))  Blood Culture (routine x 2)     Status: None (Preliminary result)   Collection Time: 08/25/21  3:20 PM   Specimen: BLOOD  Result Value Ref Range Status   Specimen Description   Final    BLOOD RIGHT ANTECUBITAL Performed at [place] [address] [place], [place] [address]    Special Requests   Final    BOTTLES DRAWN AEROBIC AND ANAEROBIC Blood Culture results may not be optimal due to an excessive volume of blood received in culture bottles Performed at [place] [address] [place], [place] [address]    Culture   Final    NO GROWTH 3 DAYS Performed at [facility], [place] [address], [place], [place] [address]    Report Status PENDING  Incomplete  Blood Culture (routine x 2)     Status: None (Preliminary result)   Collection Time: 08/25/21  3:20 PM   Specimen: BLOOD  Result Value Ref Range Status   Specimen Description   Final    BLOOD LEFT ANTECUBITAL Performed at [place] [address], [place], [place] [address]    Special Requests   Final    BOTTLES DRAWN AEROBIC AND ANAEROBIC Blood Culture results may not be optimal due to an excessive volume of blood received in culture bottles Performed at [place] [address], [place], [place] [address]    Culture   Final    NO GROWTH 3 DAYS Performed at [facility], [place] [address] [place], [place] [address]    Report Status PENDING  Incomplete  Resp Panel by RT-PCR (Flu A&B, Covid)  Nasopharyngeal Swab     Status: Abnormal   Collection Time: 08/25/21  3:30 PM   Specimen: Nasopharyngeal Swab; Nasopharyngeal(NP) swabs in vial transport medium  Result Value Ref Range Status   SARS Coronavirus 2 by RT PCR NEGATIVE NEGATIVE Final    Comment: (NOTE) SARS-CoV-2 target nucleic acids are NOT DETECTED.  The SARS-CoV-2 RNA is generally detectable in upper respiratory specimens during the acute phase of infection. The lowest concentration of SARS-CoV-2 viral copies this assay can detect is 138 copies/mL. A negative result does not preclude SARS-Cov-2 infection and should not be used as the sole basis for treatment or other patient management decisions. A negative result may occur with  improper specimen collection/handling, submission of specimen other than nasopharyngeal swab, presence of viral mutation(s) within the areas targeted by this assay, and inadequate number of viral copies(<138 copies/mL). A negative result must be combined with clinical observations, patient history, and epidemiological information. The expected result is Negative.  Fact Sheet for Patients:  EntrepreneurPulse.com.au  Fact Sheet for Healthcare Providers:  IncredibleEmployment.be  This test is no t yet approved or cleared by the [place] FDA and  has been authorized for detection and/or diagnosis of SARS-CoV-2 by FDA under an Emergency Use Authorization (EUA). This EUA will  remain  in effect (meaning this test can be used) for the duration of the COVID-19 declaration under Section 564(b)(1) of the Act, 21 U.S.C.section 360bbb-3(b)(1), unless the authorization is terminated  or revoked sooner.       Influenza A by PCR POSITIVE (A) NEGATIVE Final   Influenza B by PCR NEGATIVE NEGATIVE Final    Comment: (NOTE) The Xpert Xpress SARS-CoV-2/FLU/RSV plus assay is intended as an aid in the diagnosis of influenza from Nasopharyngeal swab specimens  and should not be used as a sole basis for treatment. Nasal washings and aspirates are unacceptable for Xpert Xpress SARS-CoV-2/FLU/RSV testing.  Fact Sheet for Patients: EntrepreneurPulse.com.au  Fact Sheet for Healthcare Providers: IncredibleEmployment.be  This test is not yet approved or cleared by the [place] FDA and has been authorized for detection and/or diagnosis of SARS-CoV-2 by FDA under an Emergency Use Authorization (EUA). This EUA will remain in effect (meaning this test can be used) for the duration of the COVID-19 declaration under Section 564(b)(1) of the Act, 21 U.S.C. section 360bbb-3(b)(1), unless the authorization is terminated or revoked.  Performed at [facility], [place] [address] [place], [place] [address]   Urine Culture     Status: None   Collection Time: 08/25/21  6:33 PM   Specimen: In/Out Cath Urine  Result Value Ref Range Status   Specimen Description   Final    IN/OUT CATH URINE Performed at [facility] [address], [place], [place] [address]    Special Requests   Final    NONE Performed at [facility], [place] [address] [place], [place] [address]    Culture   Final    NO GROWTH Performed at [facility], [place] [address], [place], [place] [address]    Report Status 08/26/2021 FINAL  Final  MRSA Next Gen by PCR, Nasal     Status: None   Collection Time: 08/25/21 11:20 PM   Specimen: Nasal Mucosa; Nasal Swab  Result Value Ref Range Status   MRSA by PCR Next Gen NOT DETECTED NOT DETECTED Final    Comment: (NOTE) The GeneXpert MRSA Assay (FDA approved for NASAL specimens only), is one component of a comprehensive MRSA colonization surveillance program. It is not intended to diagnose MRSA infection nor to guide or monitor treatment for MRSA infections. Test performance is not FDA approved in patients less than 92 years old. Performed at  [facility], [place] [address], [place], [place] [address]     Radiology Studies: No results found.   Oscar La, MD [facility] If 7PM-7AM, please contact night-coverage [email or web address] 08/29/2021, 8:47 AM

## 2021-08-30 ENCOUNTER — Inpatient Hospital Stay (HOSPITAL_COMMUNITY): Payer: Medicare Other

## 2021-08-30 LAB — BASIC METABOLIC PANEL
Anion gap: 5 (ref 5–15)
BUN: 18 mg/dL (ref 8–23)
CO2: 26 mmol/L (ref 22–32)
Calcium: 7.6 mg/dL — ABNORMAL LOW (ref 8.9–10.3)
Chloride: 107 mmol/L (ref 98–111)
Creatinine, Ser: 0.91 mg/dL (ref 0.61–1.24)
GFR, Estimated: >60 mL/min (ref >60)
Glucose, Bld: 198 mg/dL — ABNORMAL HIGH (ref 70–99)
Potassium: 3.4 mmol/L — ABNORMAL LOW (ref 3.5–5.1)
Sodium: 138 mmol/L (ref 135–145)

## 2021-08-30 LAB — CBC
HCT: 33.5 % — ABNORMAL LOW (ref 39.0–52.0)
Hemoglobin: 10.8 g/dL — ABNORMAL LOW (ref 13.0–17.0)
MCH: 29.1 pg (ref 26.0–34.0)
MCHC: 32.2 g/dL (ref 30.0–36.0)
MCV: 90.3 fL (ref 80.0–100.0)
Platelets: 381 10*3/uL (ref 150–400)
RBC: 3.71 MIL/uL — ABNORMAL LOW (ref 4.22–5.81)
RDW: 14.1 % (ref 11.5–15.5)
WBC: 15.4 10*3/uL — ABNORMAL HIGH (ref 4.0–10.5)
nRBC: 0 % (ref 0.0–0.2)

## 2021-08-30 LAB — CULTURE, BLOOD (ROUTINE X 2)
Culture: NO GROWTH
Culture: NO GROWTH

## 2021-08-30 LAB — GLUCOSE, CAPILLARY
Glucose-Capillary: 191 mg/dL — ABNORMAL HIGH (ref 70–99)
Glucose-Capillary: 171 mg/dL — ABNORMAL HIGH (ref 70–99)
Glucose-Capillary: 149 mg/dL — ABNORMAL HIGH (ref 70–99)
Glucose-Capillary: 139 mg/dL — ABNORMAL HIGH (ref 70–99)

## 2021-08-30 LAB — MAGNESIUM: Magnesium: 1.9 mg/dL (ref 1.7–2.4)

## 2021-08-30 MED ORDER — POTASSIUM CHLORIDE CRYS ER 20 MEQ PO TBCR
40.0000 meq | EXTENDED_RELEASE_TABLET | Freq: Once | ORAL | Status: AC
  Administered 2021-08-30: 40 meq via ORAL
  Filled 2021-08-30: qty 2

## 2021-08-30 MED ORDER — MUPIROCIN 2 % EX OINT
TOPICAL_OINTMENT | CUTANEOUS | Status: AC
  Filled 2021-08-30: qty 22

## 2021-08-30 NOTE — Progress Notes (Signed)
ABI attempted multiple times; patient unable to cooperate for exam due to reasons previously documented, such as pain, position, and overall lack of cooperation. Will d/c order, please reorder if/when patient disposition changes and patient is amenable to exam.  Discussed with Dr. Louanne Belton.  08/30/2021 9:23 AM Kelby Aline., MHA, RVT, RDCS, RDMS

## 2021-08-30 NOTE — Progress Notes (Signed)
Daily Progress Note   Patient Name: Bryan Farrell       Date: 08/30/2021 DOB: 03-10-1951  Age: 71 y.o. MRN#: [id number] Attending Physician: Flora Lipps, MD Primary Care Physician: Garwin Brothers, MD Admit Date: 08/25/2021  Reason for Consultation/Follow-up: Establishing goals of care  Subjective: I saw and examined Bryan Farrell today.  He was lying in bed in no distress at time of my encounter.  He was angry and agitated.  Would not answer many questions and when he did it was with one-word answers.  When asked him about needs he said, "leave me the hell alone."  Attempted to call patient's son and daughter listed in chart.  No answer and voicemail left requesting return call.  Length of Stay: 5  Current Medications: Scheduled Meds:   atorvastatin  40 mg Oral Daily   buPROPion  75 mg Oral BID   chlorhexidine  15 mL Mouth Rinse BID   Chlorhexidine Gluconate Cloth  6 each Topical Daily   divalproex  125 mg Oral QHS   enoxaparin (LOVENOX) injection  40 mg Subcutaneous Q24H   feeding supplement  237 mL Oral BID BM   gabapentin  100 mg Oral TID   Gerhardt's butt cream   Topical TID   hydrocerin   Topical BID   insulin aspart  0-15 Units Subcutaneous TID WC   insulin aspart  0-5 Units Subcutaneous QHS   mouth rinse  15 mL Mouth Rinse q12n4p   midodrine  5 mg Oral TID WC   multivitamin with minerals  1 tablet Oral Daily   oseltamivir  75 mg Oral BID   sertraline  200 mg Oral Daily   sodium chloride flush  3 mL Intravenous Q12H    Continuous Infusions:  piperacillin-tazobactam (ZOSYN)  IV 3.375 g (08/30/21 0534)    PRN Meds: acetaminophen **OR** acetaminophen, albuterol, fentaNYL (SUBLIMAZE) injection, food thickener, ondansetron **OR** ondansetron (ZOFRAN) IV, oxyCODONE,  polyethylene glycol  Physical Exam         General: Alert, awake, in no acute distress.  Angry and does not really answer questions consistently HEENT: No bruits, no goiter, no JVD Heart: Regular rate and rhythm. No murmur appreciated. Lungs: Decreased air movement, clear Abdomen: Soft, nontender, nondistended, positive bowel sounds.   Ext: Erythema and edema of the left foot.  Status  post multiple amputations with toe discoloration noted Skin: Warm and dry Neuro: Grossly intact, nonfocal.   Vital Signs: BP 133/68 (BP Location: Left Arm)    Pulse 63    Temp 98.7 F (37.1 C) (Axillary)    Resp 16    Ht 6' (1.829 m)    Wt 94 kg    SpO2 99%    BMI 28.11 kg/m  SpO2: SpO2: 99 % O2 Device: O2 Device: Nasal Cannula O2 Flow Rate: O2 Flow Rate (L/min): 3 L/min  Intake/output summary:  Intake/Output Summary (Last 24 hours) at 08/30/2021 [id number] Last data filed at 08/29/2021 1600 Gross per 24 hour  Intake 400 ml  Output --  Net 400 ml   LBM: Last BM Date: 08/29/21 Baseline Weight: Weight: 88.5 kg Most recent weight: Weight: 94 kg       Palliative Assessment/Data:    Flowsheet Rows    Flowsheet Row Most Recent Value  Intake Tab   Referral Department Hospitalist  Unit at Time of Referral Intermediate Care Unit  Palliative Care Primary Diagnosis Sepsis/Infectious Disease  Date Notified 08/26/21  Palliative Care Type New Palliative care  Reason for referral Clarify Goals of Care  Date of Admission 08/25/21  Date first seen by Palliative Care 08/27/21  # of days Palliative referral response time 1 Day(s)  # of days IP prior to Palliative referral 1  Clinical Assessment   Palliative Performance Scale Score 30%  Psychosocial & Spiritual Assessment   Palliative Care Outcomes   Patient/Family meeting held? 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  I am not sure he has insight into his situation, but this is difficult to determine as he would not really discuss with me. Attempted to call son and daughter listed on chart.  I was not able to reach them.  Voicemail left. Note Ortho consult pending.  May require further goals discussion if there is recommendation for further procedures/amputation.  We are happy to participate in this process assuming we can find family to participate. If he does not need procedure and continues to improve, would recommend outpatient palliative care to follow to continue to addressing his long-term goals as an outpatient  Code Status:    Code Status Orders  (From admission, onward)           Start     Ordered   08/25/21 1834  Full code  Continuous        08/25/21 1837           Code Status History     Date Active Date Inactive Code Status Order ID Comments User Context   05/27/2019 2314 06/03/2019 1911 Full Code [id number]  Asencion Noble, MD ED   04/18/2019 1901 04/19/2019 0128 Full Code [id number]  Lennice Sites,  DO ED   04/14/2019 1544 04/15/2019 1930 Full Code [id number]  Valarie Merino, MD ED   05/03/2017 1513 05/05/2017 1456 Full Code [id number]  Daleen Bo, MD ED   04/12/2017 1856 04/13/2017 1505 Full Code [id number]  Street, [place], [place] ED   03/31/2017 1810 04/01/2017 1510 Full Code [id number]  Pattricia Boss, MD ED   03/22/2017 1810 03/29/2017 2002  Full Code [id number]  Hildred Priest, MD Inpatient   03/18/2017 1553 03/22/2017 1757 Full Code [id number]  [facility] ED       Prognosis: Guarded  Discharge Planning: To Be Determined  Micheline Rough, MD  Please contact Palliative Medicine Team phone at [phone] for questions and concerns.

## 2021-08-30 NOTE — Care Management Important Message (Signed)
Important Message  Patient Details IM Letter placed in Patients room. Name: Bryan Farrell MRN: [id number] Date of Birth: 05-11-1951   Medicare Important Message Given:  Yes     Caren Macadam 08/30/2021, 11:36 AM

## 2021-08-30 NOTE — Progress Notes (Signed)
Patient refused all medications this morning. Dr. Louanne Belton is aware.  Patient states "Leave me alone" when asked if he needs anything.  Explained to patient if he changes mind about medication he can use call light and let me know.

## 2021-08-30 NOTE — Progress Notes (Addendum)
PROGRESS NOTE  Bryan Farrell HAL:[id number] DOB: 10/16/1950   PCP: Garwin Brothers, MD  Brief Narrative:  71 year old Male with past medical history of vascular dementia, diabetes mellitus type 2,  remote left great toe osteomyelitis s/p amputation and depression was brought into the hospital from nursing home with increasing left lower extremity redness, swelling.  Patient was then admitted to the hospital for severe cellulitis and sepsis.  Initial temperature was elevated at 102.6 F with leukocytosis at 20K.  Patient did have significant lactic acidosis.  Blood cultures were obtained patient patient received IV fluid boluses and was initially admitted to the stepdown unit.  Patient also tested positive for influenza.    Assessment & Plan:  Severe sepsis due to LLE cellulitis:  Present on admission.  Received multiple fluid boluses.  Significantly elevated lactate on presentation which has improved with IV fluid hydration.  Lower extremity ultrasound was negative for DVT.  ESR was elevated.  Blood cultures negative so far. X-ray of the left foot with subcutaneous soft tissue edema and emphysema with no definite radiographic evidence of osteomyelitis.  We will continue with broad-spectrum antibiotic for now.   WBC elevated at 15.4 lower from 18.1 T-max of 98.72F.  Spoke with Dr. Sharol Given orthopedic yesterday who recommended antibiotics and outpatient follow-up in the clinic in 1 week. Continue IV Zosyn for now.   Influenza A infection:  Continue Tamiflu.    Lactic acidosis:  Likely secondary to infection and volume depletion.  Initial lactate of 8.3.  Has normalized to 1.2.  Hypotension:  Hypertensives on hold.  Patient is refusing medications as well.  Blood pressure has improved   AKI/azotemia:  Improved with IV hydration.  Monitor BMP closely.  Latest creatinine of 0.9  Hypokalemia:  Will continue to replace orally and through IV if oral not possible check BMP in AM.  Uncontrolled  NIDDM-2 with hyperglycemia, diabetic neuropathy:  Latest A1c 9.0%.  Continue sliding scale insulin.  Continue gabapentin and Lipitor.  POC glucose of 171  Normocytic anemia:  hemoglobin at 10.8.  Continue to monitor closely.   Vascular dementia with behavioral disturbance/history of depression and adjustment disorder: Continue Depakote, Wellbutrin and Zoloft.  Likely at baseline.  Will consult with psychiatry since he is refusing medications.  Physical deconditioning/debility Seen by physical, occupational therapy evaluation recommended skilled nursing facility placement on discharge.   Concern about dysphagia: Speech therapy on board, currently recommending regular consistency diet  Disposition.   Patient is from skilled nursing facility. Likely disposition plan to skilled nursing facility when improved.  DVT prophylaxis:  enoxaparin (LOVENOX) injection 40 mg Start: 08/25/21 2200  Code Status: Full code  Family Communication:  Spoke with the patient's son on the phone on 08/30/2021 and updated him about the clinical condition of the patient .  Status is: Inpatient  Remains inpatient appropriate because: Severe sepsis due to LLE cellulitis, requiring IV antibiotics and further evaluation  Consultants:  Spoke with Dr Sharol Given orthopedics yesterday  Procedures:  None  Sch Meds:  Scheduled Meds:  atorvastatin  40 mg Oral Daily   buPROPion  75 mg Oral BID   chlorhexidine  15 mL Mouth Rinse BID   Chlorhexidine Gluconate Cloth  6 each Topical Daily   divalproex  125 mg Oral QHS   enoxaparin (LOVENOX) injection  40 mg Subcutaneous Q24H   feeding supplement  237 mL Oral BID BM   gabapentin  100 mg Oral TID   Gerhardt's butt cream   Topical TID   hydrocerin  Topical BID   insulin aspart  0-15 Units Subcutaneous TID WC   insulin aspart  0-5 Units Subcutaneous QHS   mouth rinse  15 mL Mouth Rinse q12n4p   midodrine  5 mg Oral TID WC   multivitamin with minerals  1 tablet Oral  Daily   mupirocin ointment       oseltamivir  75 mg Oral BID   sertraline  200 mg Oral Daily   sodium chloride flush  3 mL Intravenous Q12H   Continuous Infusions:  piperacillin-tazobactam (ZOSYN)  IV 3.375 g (08/30/21 0534)   PRN Meds:.acetaminophen **OR** acetaminophen, albuterol, fentaNYL (SUBLIMAZE) injection, food thickener, ondansetron **OR** ondansetron (ZOFRAN) IV, oxyCODONE, polyethylene glycol  Antimicrobials:  Zosyn IV  Anti-infectives (From admission, onward)    Start     Dose/Rate Route Frequency Ordered Stop   08/26/21 2100  piperacillin-tazobactam (ZOSYN) IVPB 3.375 g        3.375 g 12.5 mL/hr over 240 Minutes Intravenous Every 8 hours 08/26/21 1443     08/26/21 1600  vancomycin (VANCOREADY) IVPB 1250 mg/250 mL  Status:  Discontinued        1,250 mg 166.7 mL/hr over 90 Minutes Intravenous 2 times daily 08/26/21 1507 08/28/21 1348   08/26/21 1000  oseltamivir (TAMIFLU) capsule 30 mg  Status:  Discontinued       See Hyperspace for full Linked Orders Report.   30 mg Oral 2 times daily 08/25/21 1936 08/26/21 0723   08/26/21 1000  oseltamivir (TAMIFLU) capsule 75 mg  Status:  Discontinued       See Hyperspace for full Linked Orders Report.   75 mg Oral 2 times daily 08/26/21 0723 08/26/21 0724   08/26/21 1000  oseltamivir (TAMIFLU) capsule 75 mg        75 mg Oral 2 times daily 08/26/21 0724 08/31/21 0959   08/25/21 2100  vancomycin (VANCOREADY) IVPB 1750 mg/350 mL  Status:  Discontinued        1,750 mg 175 mL/hr over 120 Minutes Intravenous Every 24 hours 08/25/21 1942 08/26/21 1507   08/25/21 2000  metroNIDAZOLE (FLAGYL) IVPB 500 mg  Status:  Discontinued        500 mg 100 mL/hr over 60 Minutes Intravenous Every 12 hours 08/25/21 1932 08/26/21 1443   08/25/21 2000  ceFEPIme (MAXIPIME) 2 g in sodium chloride 0.9 % 100 mL IVPB  Status:  Discontinued        2 g 200 mL/hr over 30 Minutes Intravenous Every 8 hours 08/25/21 1942 08/26/21 1443   08/25/21 1945  oseltamivir  (TAMIFLU) capsule 75 mg  Status:  Discontinued       See Hyperspace for full Linked Orders Report.   75 mg Oral  Once 08/25/21 1936 08/26/21 0724   08/25/21 1545  cefTRIAXone (ROCEPHIN) 2 g in sodium chloride 0.9 % 100 mL IVPB  Status:  Discontinued        2 g 200 mL/hr over 30 Minutes Intravenous Every 24 hours 08/25/21 1535 08/25/21 1932     Subjective:  Today, patient was seen and examined at bedside.  Patient does not really want to talk.  Has some foot pain.  Poor historian and appears to be mildly impulsive.Nursing staff reported that patient was refusing meds.  Objective: Vitals:   08/27/21 1931 08/27/21 2000 08/27/21 2337 08/28/21 0554  BP: 122/64 (!) 98/53 (!) 148/81 133/78  Pulse: 78 77 83 82  Resp: 18 (!) 27 (!) 24 15  Temp: 98.5 F (36.9 C)  99.8 F (  37.7 C) 98.8 F (37.1 C)  TempSrc: Oral  Oral Oral  SpO2: 94% 94% 97% 93%  Weight:      Height:        Physical examination: General:  Average built, not in obvious distress, closing eyes, appears impulsive and frustrated at times. HENT:   No scleral pallor or icterus noted. Oral mucosa is moist.  Chest:   Diminished breath sounds bilaterally. No crackles or wheezes.  CVS: S1 &S2 heard. No murmur.  Regular rate and rhythm. Abdomen: Soft, nontender, nondistended.  Bowel sounds are heard.   Extremities: Status post right first amputation with second toe discoloration, status post left great toe amputation with erythema, edema over the left foot and lower leg. Psych: Alert, awake and communicative, impulsive at times,  CNS:  No cranial nerve deficits.  Moves all the extremities. Skin: Warm and dry.  Bilateral lower extremity with dressing.  I have personally reviewed the following labs and imaging studies.  CBC: Recent Labs  Lab 08/25/21 1520 08/26/21 0532 08/27/21 0307 08/28/21 0909 08/29/21 0416 08/30/21 0427  WBC 21.6* 16.8* 16.8* 17.8* 18.1* 15.4*  NEUTROABS 19.5*  --   --   --   --   --   HGB 13.9 11.0*  11.1* 11.1* 10.4* 10.8*  HCT 41.5 32.8* 34.5* 34.6* 32.2* 33.5*  MCV 87.9 88.4 90.6 91.3 90.2 90.3  PLT 281 210 250 342 370 381    BMP &GFR Recent Labs  Lab 08/26/21 0532 08/27/21 0307 08/28/21 0444 08/28/21 0909 08/29/21 0416 08/30/21 0427  NA 136 139  --  140 140 138  K 3.1* 3.7  --  3.7 3.6 3.4*  CL 101 109  --  111 109 107  CO2 25 23  --  23 24 26   GLUCOSE 165* 110*  --  176* 171* 198*  BUN 25* 26*  --  23 20 18   CREATININE 1.02 1.08   1.11 1.02 0.97 1.06 0.91  CALCIUM 7.6* 7.5*  --  7.6* 7.8* 7.6*  MG 1.7 2.1  --  2.2 2.0 1.9  PHOS  --  2.6  --   --   --   --     Estimated Creatinine Clearance: 92.5 mL/min (by C-G formula based on SCr of 0.91 mg/dL). Liver & Pancreas: Recent Labs  Lab 08/25/21 1520 08/26/21 0532 08/27/21 0307 08/29/21 0416  AST 50* 40  --  33  ALT 32 25  --  21  ALKPHOS 78 60  --  83  BILITOT 1.2 0.9  --  0.7  PROT 7.6 5.5*  --  5.6*  ALBUMIN 3.0* 2.1* 2.1* 1.8*    No results for input(s): LIPASE, AMYLASE in the last 168 hours. No results for input(s): AMMONIA in the last 168 hours. Diabetic: No results for input(s): HGBA1C in the last 72 hours. Recent Labs  Lab 08/29/21 0745 08/29/21 1106 08/29/21 1707 08/29/21 2016 08/30/21 0734  GLUCAP 171* 185* 165* 153* 191*    Cardiac Enzymes: Recent Labs  Lab 08/27/21 0307  CKTOTAL 245    No results for input(s): PROBNP in the last 8760 hours. Coagulation Profile: Recent Labs  Lab 08/25/21 1520 08/26/21 0532  INR 1.1 1.3*    Thyroid Function Tests: No results for input(s): TSH, T4TOTAL, FREET4, T3FREE, THYROIDAB in the last 72 hours. Lipid Profile: No results for input(s): CHOL, HDL, LDLCALC, TRIG, CHOLHDL, LDLDIRECT in the last 72 hours. Anemia Panel: No results for input(s): VITAMINB12, FOLATE, FERRITIN, TIBC, IRON, RETICCTPCT in the last  72 hours. Urine analysis:    Component Value Date/Time   COLORURINE AMBER (A) 08/25/2021 1833   APPEARANCEUR HAZY (A) 08/25/2021 1833    LABSPEC 1.024 08/25/2021 1833   PHURINE 5.0 08/25/2021 1833   GLUCOSEU >=500 (A) 08/25/2021 1833   HGBUR MODERATE (A) 08/25/2021 1833   BILIRUBINUR NEGATIVE 08/25/2021 1833   KETONESUR 5 (A) 08/25/2021 1833   PROTEINUR >=300 (A) 08/25/2021 1833   UROBILINOGEN 1.0 04/20/2010 1105   NITRITE NEGATIVE 08/25/2021 1833   LEUKOCYTESUR NEGATIVE 08/25/2021 1833   Sepsis Labs: Invalid input(s): PROCALCITONIN, [place]  Microbiology: Recent Results (from the past 240 hour(s))  Blood Culture (routine x 2)     Status: None   Collection Time: 08/25/21  3:20 PM   Specimen: BLOOD  Result Value Ref Range Status   Specimen Description   Final    BLOOD RIGHT ANTECUBITAL Performed at [facility] [address], [place], [place] [address]    Special Requests   Final    BOTTLES DRAWN AEROBIC AND ANAEROBIC Blood Culture results may not be optimal due to an excessive volume of blood received in culture bottles Performed at [facility] [address], [place], [place] [address]    Culture   Final    NO GROWTH 5 DAYS Performed at [facility], [place] [address], [place], [place] [address]    Report Status 08/30/2021 FINAL  Final  Blood Culture (routine x 2)     Status: None   Collection Time: 08/25/21  3:20 PM   Specimen: BLOOD  Result Value Ref Range Status   Specimen Description   Final    BLOOD LEFT ANTECUBITAL Performed at [facility] [address] [place], [place] [address]    Special Requests   Final    BOTTLES DRAWN AEROBIC AND ANAEROBIC Blood Culture results may not be optimal due to an excessive volume of blood received in culture bottles Performed at [facility] [address] [place], [place] [address]    Culture   Final    NO GROWTH 5 DAYS Performed at [facility], [place] [address], [place], [place] [address]    Report Status 08/30/2021 FINAL  Final  Resp Panel by RT-PCR (Flu A&B,  Covid) Nasopharyngeal Swab     Status: Abnormal   Collection Time: 08/25/21  3:30 PM   Specimen: Nasopharyngeal Swab; Nasopharyngeal(NP) swabs in vial transport medium  Result Value Ref Range Status   SARS Coronavirus 2 by RT PCR NEGATIVE NEGATIVE Final    Comment: (NOTE) SARS-CoV-2 target nucleic acids are NOT DETECTED.  The SARS-CoV-2 RNA is generally detectable in upper respiratory specimens during the acute phase of infection. The lowest concentration of SARS-CoV-2 viral copies this assay can detect is 138 copies/mL. A negative result does not preclude SARS-Cov-2 infection and should not be used as the sole basis for treatment or other patient management decisions. A negative result may occur with  improper specimen collection/handling, submission of specimen other than nasopharyngeal swab, presence of viral mutation(s) within the areas targeted by this assay, and inadequate number of viral copies(<138 copies/mL). A negative result must be combined with clinical observations, patient history, and epidemiological information. The expected result is Negative.  Fact Sheet for Patients:  EntrepreneurPulse.com.au  Fact Sheet for Healthcare Providers:  IncredibleEmployment.be  This test is no t yet approved or cleared by the [place] FDA and  has been authorized for detection and/or diagnosis of SARS-CoV-2 by  FDA under an Emergency Use Authorization (EUA). This EUA will remain  in effect (meaning this test can be used) for the duration of the COVID-19 declaration under Section 564(b)(1) of the Act, 21 U.S.C.section 360bbb-3(b)(1), unless the authorization is terminated  or revoked sooner.       Influenza A by PCR POSITIVE (A) NEGATIVE Final   Influenza B by PCR NEGATIVE NEGATIVE Final    Comment: (NOTE) The Xpert Xpress SARS-CoV-2/FLU/RSV plus assay is intended as an aid in the diagnosis of influenza from Nasopharyngeal swab specimens  and should not be used as a sole basis for treatment. Nasal washings and aspirates are unacceptable for Xpert Xpress SARS-CoV-2/FLU/RSV testing.  Fact Sheet for Patients: EntrepreneurPulse.com.au  Fact Sheet for Healthcare Providers: IncredibleEmployment.be  This test is not yet approved or cleared by the [place] FDA and has been authorized for detection and/or diagnosis of SARS-CoV-2 by FDA under an Emergency Use Authorization (EUA). This EUA will remain in effect (meaning this test can be used) for the duration of the COVID-19 declaration under Section 564(b)(1) of the Act, 21 U.S.C. section 360bbb-3(b)(1), unless the authorization is terminated or revoked.  Performed at [facility], [place] [address] [place], [place] [address]   Urine Culture     Status: None   Collection Time: 08/25/21  6:33 PM   Specimen: In/Out Cath Urine  Result Value Ref Range Status   Specimen Description   Final    IN/OUT CATH URINE Performed at [facility] [address], [place], [place] [address]    Special Requests   Final    NONE Performed at [facility], [place] [address] [place], [place] [address]    Culture   Final    NO GROWTH Performed at [facility], [place] [address] [place], [place] [address]    Report Status 08/26/2021 FINAL  Final  MRSA Next Gen by PCR, Nasal     Status: None   Collection Time: 08/25/21 11:20 PM   Specimen: Nasal Mucosa; Nasal Swab  Result Value Ref Range Status   MRSA by PCR Next Gen NOT DETECTED NOT DETECTED Final    Comment: (NOTE) The GeneXpert MRSA Assay (FDA approved for NASAL specimens only), is one component of a comprehensive MRSA colonization surveillance program. It is not intended to diagnose MRSA infection nor to guide or monitor treatment for MRSA infections. Test performance is not FDA approved in patients less than 33 years old. Performed at  [facility], [place] [address], [place], [place] [address]     Radiology Studies: No results found.  Oscar La, MD [facility] If 7PM-7AM, please contact night-coverage [email or web address] 08/30/2021, 10:27 AM

## 2021-08-30 NOTE — Progress Notes (Addendum)
pt is refusing to take meds: Tamiflu, depakote, gabapentin, bupropion. When asked for reasoning of refusal pt stated that he "just does not feel up to it". Pt did allow for Lovenox and IV antx to be administered. On call APP Garner Nash NP aware.  Pt also stated that he did want to eat any food at this time at all. Pt was encouraged to eat/drink ensure but pt still declined offer  Will continue to monitor and address needs of pt accordingly and as allowed by pt

## 2021-08-30 NOTE — Progress Notes (Signed)
Speech Language Pathology Treatment: Dysphagia  Patient Details Name: Bryan Farrell MRN: [id number] DOB: 1950/09/17 Today's Date: 08/30/2021 Time: 2993-7169 SLP Time Calculation (min) (ACUTE ONLY): 12 min  Assessment / Plan / Recommendation Clinical Impression  Pt seen for skilled SLP to address dysphagia goals - assuring tolerance of dietary advancement to thin from nectar thick liquids. Pt with untouched lunch tray at bedside - he advises that the food tastes "bad" and he doesn't want to eat. Pt will drink some soda only.   Per review of chart, he is declining medical assistance, refusing medications frequently.  Note pt being followed by palliative care and has h/o osteomyelitis. 50% of meal acceptance documented for 1/16  - none documented after this time.  Pt willing to consume water via straw with this SLP. Suboptimal positioning noted with pt slid down in bed - and only allowing SLP to raise his head approximately 30*.   No indication of airway compromise with sequential boluses of thin water today - approx 2 ounces.  Given pt's poor intake, ease of agitation and lack of participation with medical care, as well as negative CXR, recommend continue diet as tolerated.  Pt admits he coughs some with liquids more than foods but doubtful he would comply with diet modification or compensation strategies due to decreased insight.  Will follow up x1 in hopes to observe pt with a meal to allow functional meal observation to determine if dysphagia may be contributing to lack of intake.  ? if he could have some of his dietary restrictions lifted potentially to help improve intake.  Note pt's h/o suicial ideation, homelessness - thus he appears with complex social history.   At this time, he does appear to be tolerating his dietary advancement but intake is very poor.  Advised pt to plan for SLP to follow up and to general precautions.       HPI HPI: 71 y.o. male admitted from [facility] with  sepsis due to left leg cellulitis.  Influenza A +. PMHx dementia, diabetes, history of left toe osteo and amputation, adjustment disorder, depression. Pt has been coughing with PO intake, precipitating swallowing consult.      SLP Plan  Continue with current plan of care      Recommendations for follow up therapy are one component of a multi-disciplinary discharge planning process, led by the attending physician.  Recommendations may be updated based on patient status, additional functional criteria and insurance authorization.    Recommendations  Diet recommendations: Regular;Thin liquid Liquids provided via: Cup;Straw Medication Administration: Whole meds with puree Supervision: Patient able to self feed;Full supervision/cueing for compensatory strategies Compensations: Slow rate;Small sips/bites Postural Changes and/or Swallow Maneuvers:  (as upright as able. have pt self feed as able)                Oral Care Recommendations: Oral care BID Follow Up Recommendations: Other (comment) (TBD) Assistance recommended at discharge: Frequent or constant Supervision/Assistance SLP Visit Diagnosis: Dysphagia, unspecified (R13.10) Plan: Continue with current plan of care           Terance Ice, MS [facility] SLP Acute Rehab Services Office [phone] Cell [phone]  08/30/2021, 5:18 PM

## 2021-08-30 NOTE — Progress Notes (Signed)
Patient did allow changing of his toe dressings, but did not want me to assess under the dressings on his L leg. Tolerated dressing change well.  He did ask for peanut butter and saltine crackers when food was offered.

## 2021-08-30 NOTE — Progress Notes (Signed)
Patient is alert and oriented.  He is refusing all care and medications at this time.  Offered to order patient a tray, he declined stating that 'the food is crap, leave me alone'. Dr. Tyson Babinski aware. Will continue to monitor and attempt care as allowed by patient.

## 2021-08-30 NOTE — Progress Notes (Signed)
Blister noted to L posterior calf during initial assessment.  LLE noted to be weeping serous drainage.  At the end of the shift, blister was gone, but large open area noted.  Non-adherent dressing, ABD and Kerlix used to loosely wrap everything in place.  Prevalon boots replaced at this time

## 2021-08-31 DIAGNOSIS — F32A Depression, unspecified: Secondary | ICD-10-CM

## 2021-08-31 LAB — BASIC METABOLIC PANEL
Anion gap: 7 (ref 5–15)
BUN: 15 mg/dL (ref 8–23)
CO2: 28 mmol/L (ref 22–32)
Calcium: 7.5 mg/dL — ABNORMAL LOW (ref 8.9–10.3)
Chloride: 105 mmol/L (ref 98–111)
Creatinine, Ser: 0.9 mg/dL (ref 0.61–1.24)
GFR, Estimated: >60 mL/min (ref >60)
Glucose, Bld: 139 mg/dL — ABNORMAL HIGH (ref 70–99)
Potassium: 3.4 mmol/L — ABNORMAL LOW (ref 3.5–5.1)
Sodium: 140 mmol/L (ref 135–145)

## 2021-08-31 LAB — CBC
HCT: 33.3 % — ABNORMAL LOW (ref 39.0–52.0)
Hemoglobin: 11 g/dL — ABNORMAL LOW (ref 13.0–17.0)
MCH: 29.5 pg (ref 26.0–34.0)
MCHC: 33 g/dL (ref 30.0–36.0)
MCV: 89.3 fL (ref 80.0–100.0)
Platelets: 410 10*3/uL — ABNORMAL HIGH (ref 150–400)
RBC: 3.73 MIL/uL — ABNORMAL LOW (ref 4.22–5.81)
RDW: 13.7 % (ref 11.5–15.5)
WBC: 14 10*3/uL — ABNORMAL HIGH (ref 4.0–10.5)
nRBC: 0 % (ref 0.0–0.2)

## 2021-08-31 LAB — GLUCOSE, CAPILLARY
Glucose-Capillary: 127 mg/dL — ABNORMAL HIGH (ref 70–99)
Glucose-Capillary: 125 mg/dL — ABNORMAL HIGH (ref 70–99)
Glucose-Capillary: 115 mg/dL — ABNORMAL HIGH (ref 70–99)
Glucose-Capillary: 118 mg/dL — ABNORMAL HIGH (ref 70–99)

## 2021-08-31 MED ORDER — MELATONIN 3 MG PO TABS
3.0000 mg | ORAL_TABLET | Freq: Every day | ORAL | Status: DC
  Administered 2021-09-01: 3 mg via ORAL
  Filled 2021-08-31 (×2): qty 1

## 2021-08-31 MED ORDER — THIAMINE HCL 100 MG/ML IJ SOLN
200.0000 mg | Freq: Three times a day (TID) | INTRAVENOUS | Status: DC
  Administered 2021-08-31 – 2021-09-02 (×6): 200 mg via INTRAVENOUS
  Filled 2021-08-31 (×9): qty 2

## 2021-08-31 MED ORDER — VALPROATE SODIUM 100 MG/ML IV SOLN
125.0000 mg | Freq: Every day | INTRAVENOUS | Status: DC
  Administered 2021-08-31 – 2021-09-01 (×2): 125 mg via INTRAVENOUS
  Filled 2021-08-31 (×3): qty 1.25

## 2021-08-31 NOTE — Consult Note (Signed)
[facility] Psychiatry New Face-to-FacePsychiatric Evaluation   Service Date: August 31, 2021 LOS:  LOS: 6 days    Assessment  Bryan Farrell is a 71 y.o. male admitted medically for 08/25/2021  2:56 PM for an infected foot. He carries the psychiatric diagnoses of depression and has a past medical history of  sepsis, vascular dementia, and diabetes. Psychiatry was consulted for ?depression and refusal of medicatoins by Bryan Das, MD.    His current presentation of inability to stay awake on examination despite effort is most consistent with a mild delirium (mental status has improved since initial ED presentation) or known vascular dementia. He has a historical diagnosis of depression although was hard to explore this given pt's minimal participation in exam.  Current outpatient psychotropic medications include buproprion, sertraline, and  and historically he has had an unknown response to these medications. Unknown if he was compliant prior to examination. . On initial examination, patient was oriented to self, situation and time, denied SI/HI/AH, and was surprisingly able to complete DOWB. Was notable for pt falling asleep every 30s-1 min despite efforts to stay awake. Please see plan below for detailed recommendations; would consider extending IV thiamine to 5 d if he has some response to this medication.   Diagnoses:  Active Hospital problems: Principal Problem:   Sepsis due to cellulitis [facility]) Active Problems:   Vascular dementia (HCC)   Diabetes (HCC)    Problems edited/added by me: No problems updated.  Plan  ## Safety and Observation Level:  - Based on my clinical evaluation, I estimate the patient to be at low risk of self harm in the current setting - At this time, we recommend a routine level of observation. This decision is based on my review of the chart including patient's history and current presentation, interview of the patient, mental status examination,  and consideration of suicide risk including evaluating suicidal ideation, plan, intent, suicidal or self-harm behaviors, risk factors, and protective factors. This judgment is based on our ability to directly address suicide risk, implement suicide prevention strategies and develop a safety plan while the patient is in the clinical setting. Please contact our team if there is a concern that risk level has changed.   ## Medications:  -- c sertraline 200 mg -- c buproprion 75 mg BID -- depakote 125 mg --> 125 mg IV (no IV formulation available for other meds) - s thiamine   ## Medical Decision Making Capacity:  Not formally assessed.  ## Further Work-up:  -- B12, TSH, folate -- trough depakote in 72h - expect it to be subtherapeutic. Pt with low albumin would not target therapeutic dose.    -- most recent EKG on 1/13 had QtC of 472 -- Pertinent labwork reviewed earlier this admission includes: albumin 1.8, electrolytes grossly wnl (low potassium, elevated glucose), downtrending leukocytosis, previous elevation in lactic acid now normalized  Remote mild elevation in TSH, remote low/nl B12  ## Disposition:  -- per primary  ## Behavioral / Environmental:  DELIRIUM RECS 1: Avoid benzodiazepines, antihistamines, anticholinergics, and minimize opiate use as these may worsen delirium. 2:Assess, prevent and manage pain as lack of treatment can result in delirium.  3: Recommend consult to PT/OT if not already done. Early mobility and exercise has been shown to decrease duration of delirium.  4:Provide appropriate lighting and clear signage; a clock and calendar should be easily visible to the patient. 5:Monitor environmental factors. Reduce light and noise at night (close shades, turn off lights,  turn off TV, ect). Correct any alterations in sleep cycle. 6: Reorient the patient to person, place, time and situation on each encounter.  7: Correct sensory deficits if possible (replace eye glasses,  hearing aids, ect). 8: Avoid restraints. Severely delirious patients benefit from constant observation by a sitter. 9: Do not leave patient unattended.    ##Legal Status   Thank you for this consult request. Recommendations have been communicated to the primary team.  We will continue to follow at this time and will see again Friday; will likely sign off after next assessment.  Gatha Mcnulty A Ari Engelbrecht   New history  Relevant Aspects of Hospital Course:  Admitted on 08/25/2021 for sepsis; has been refusing medicaitons over last 2-3 days.  Patient Report:  Pt seen in afternoon. He is oriented to self, year, location, and general situation. He is able to do DOWF and DOWB, albeit with some difficulty. He falls asleep numerous times in exam. Answers most questions with "maybe" or "I don't know". States that he doesn't want to take medications because of a general fear of taking too many meds; unable to engage in any discussion on specific consequences of not taking medications. When told he is likely to die if he does not take antibiotics (recently septic) he did say he didn't want to die. Was unable to provide any rationale for why he took meds earlier in hospital stay.  Was able to deny SI, HI, AH/VH and endorse poor appetite. Did state he was living in a shelter and felt like the people there took good care of him at one point in interview.   ROS:  Unable to obtain  Collateral information:  None available  Psychiatric History:  Information collected from pt, EMR  Family psych history: unknown   Social History:   Tobacco use: none per EMR Alcohol use: no history per EMR in past BH assessments, am starting thiamine Drug use: none per EMR  Family History:  The patient's family history includes Heart Problems in his father and mother.  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   Hypertension     Surgical History: Past Surgical History:  Procedure Laterality Date   AMPUTATION Left 05/30/2019   Procedure: LEFT FOOT 2ND RAY AMPUTATION;  Surgeon: Nadara Mustarduda, Marcus V, MD;  Location: [facility] OR;  Service: Orthopedics;  Laterality: Left;   TOE AMPUTATION Right    TONSILLECTOMY      Medications:   Current Facility-Administered Medications:    acetaminophen (TYLENOL) tablet 650 mg, 650 mg, Oral, Q6H PRN **OR** acetaminophen (TYLENOL) suppository 650 mg, 650 mg, Rectal, Q6H PRN, Alanda SlimGonfa, Taye T, MD, 650 mg at 08/26/21 1433   albuterol (PROVENTIL) (2.5 MG/3ML) 0.083% nebulizer solution 2.5 mg, 2.5 mg, Nebulization, Q4H PRN, Pokhrel, Laxman, MD, 2.5 mg at 08/28/21 0857   atorvastatin (LIPITOR) tablet 40 mg, 40 mg, Oral, Daily, Gonfa, Taye T, MD, 40 mg at 08/29/21 0913   buPROPion (WELLBUTRIN) tablet 75 mg, 75 mg, Oral, BID, Gonfa, Taye T, MD, 75 mg at 08/29/21 2333   chlorhexidine (PERIDEX) 0.12 % solution 15 mL, 15 mL, Mouth Rinse, BID, Gonfa, Taye T, MD, 15 mL at 08/29/21 2334   Chlorhexidine Gluconate Cloth 2 % PADS 6 each, 6 each, Topical, Daily, Gonfa, Taye T, MD, 6 each at 08/28/21 0916   enoxaparin (LOVENOX) injection 40 mg, 40 mg, Subcutaneous, Q24H, Alanda SlimGonfa, Boyce Mediciaye T, MD,  40 mg at 08/30/21 2213   feeding supplement (ENSURE ENLIVE / ENSURE PLUS) liquid 237 mL, 237 mL, Oral, BID BM, Alanda Slim, Taye T, MD, 237 mL at 08/29/21 2334   fentaNYL (SUBLIMAZE) injection 12.5 mcg, 12.5 mcg, Intravenous, Q2H PRN, Alanda Slim, Taye T, MD, 12.5 mcg at 08/27/21 1829   food thickener (SIMPLYTHICK (NECTAR/LEVEL 2/MILDLY THICK)) 10 packet, 10 packet, Oral, PRN, Pokhrel, Laxman, MD   gabapentin (NEURONTIN) capsule 100 mg, 100 mg, Oral, TID, Pokhrel, Laxman, MD, 100 mg at 08/29/21 2334   Gerhardt's butt cream, , Topical, TID, Almon Hercules, MD, Given at 08/30/21 2218   hydrocerin (EUCERIN) cream, , Topical, BID, Candelaria Stagers T, MD, Given at 08/30/21 2218   insulin aspart (novoLOG) injection 0-15 Units,  0-15 Units, Subcutaneous, TID WC, Gonfa, Taye T, MD, 2 Units at 08/31/21 1226   insulin aspart (novoLOG) injection 0-5 Units, 0-5 Units, Subcutaneous, QHS, Gonfa, Taye T, MD   MEDLINE mouth rinse, 15 mL, Mouth Rinse, q12n4p, Gonfa, Taye T, MD, 15 mL at 08/28/21 1741   midodrine (PROAMATINE) tablet 5 mg, 5 mg, Oral, TID WC, Gonfa, Taye T, MD, 5 mg at 08/29/21 1755   multivitamin with minerals tablet 1 tablet, 1 tablet, Oral, Daily, Alanda Slim, Taye T, MD, 1 tablet at 08/29/21 0913   ondansetron (ZOFRAN) tablet 4 mg, 4 mg, Oral, Q6H PRN **OR** ondansetron (ZOFRAN) injection 4 mg, 4 mg, Intravenous, Q6H PRN, Alanda Slim, Taye T, MD, 4 mg at 08/31/21 0258   oxyCODONE (Oxy IR/ROXICODONE) immediate release tablet 5 mg, 5 mg, Oral, Q6H PRN, Pokhrel, Laxman, MD, 5 mg at 08/29/21 1755   piperacillin-tazobactam (ZOSYN) IVPB 3.375 g, 3.375 g, Intravenous, Q8H, Gonfa, Taye T, MD, Last Rate: 12.5 mL/hr at 08/31/21 0601, 3.375 g at 08/31/21 0601   polyethylene glycol (MIRALAX / GLYCOLAX) packet 17 g, 17 g, Oral, Daily PRN, Alanda Slim, Taye T, MD   sertraline (ZOLOFT) tablet 200 mg, 200 mg, Oral, Daily, Alanda Slim, Taye T, MD, 200 mg at 08/29/21 0913   sodium chloride flush (NS) 0.9 % injection 3 mL, 3 mL, Intravenous, Q12H, Gonfa, Taye T, MD, 3 mL at 08/30/21 2219   valproate (DEPACON) 125 mg in dextrose 5 % 50 mL IVPB, 125 mg, Intravenous, QHS, Donyale Falcon A  Allergies: No Known Allergies     Objective  Vital signs:  Temp:  [98.2 F (36.8 C)-98.6 F (37 C)] 98.2 F (36.8 C) (01/18 1426) Pulse Rate:  [67-71] 67 (01/18 1426) Resp:  [18-20] 18 (01/18 1426) BP: (131-174)/(78-89) 131/78 (01/18 1426) SpO2:  [93 %-95 %] 93 % (01/18 1426)  Psychiatric Specialty Exam:  Presentation  General Appearance: Disheveled (overgrown and untended beard) Eye Contact:Poor (falls asleep every minute or two throughout exam) Speech:Garbled Speech Volume:Normal Handedness:No data recorded  Mood and Affect  Mood:-- ("I don't  know") Affect:-- (tired)  Thought Process  Thought Processes:-- (minimal spontaneous thought) Descriptions of Associations:-- (concrete)  Orientation:Full (Time, Place and Person)  Thought Content:-- (?mild paranoid ideations toward medication)  History of Schizophrenia/Schizoaffective disorder:No data recorded Duration of Psychotic Symptoms:No data recorded Hallucinations:Hallucinations: None (denied, not overtly RIS)  Ideas of Reference:None  Suicidal Thoughts:Suicidal Thoughts: No  Homicidal Thoughts:Homicidal Thoughts: No   Sensorium  Memory:Immediate Fair; Recent Fair; Remote Fair Judgment:Poor Insight:Poor  Executive Functions  Concentration:Poor Attention Span:Poor Recall:Poor Fund of Knowledge:Poor Language:Poor  Psychomotor Activity  Psychomotor Activity:Psychomotor Activity: Decreased  Assets  Assets:-- (likes staff at shelter)  Sleep  Sleep:Sleep: -- (too much, sleeping durin gday)   Physical Exam: Physical Exam Constitutional:  Appearance: He is ill-appearing.  HENT:     Head: Normocephalic.  Neurological:     Mental Status: He is oriented to person, place, and time.  Psychiatric:     Comments: Minimal spontaneous thought    Review of Systems  Reason unable to perform ROS: pt answers "I don't know" to most questions.  Blood pressure 131/78, pulse 67, temperature 98.2 F (36.8 C), temperature source Oral, resp. rate 18, height 6' (1.829 m), weight 94 kg, SpO2 93 %. Body mass index is 28.11 kg/m.

## 2021-08-31 NOTE — Progress Notes (Addendum)
PROGRESS NOTE  Bryan Farrell AYT:[id number] DOB: 03-10-1951   PCP: Garwin Brothers, MD  Brief Narrative:  71 year old Male with past medical history of vascular dementia, diabetes mellitus type 2,  remote left great toe osteomyelitis s/p amputation and depression was brought into the hospital from nursing home with increasing left lower extremity redness, swelling.  Patient was then admitted to the hospital for severe cellulitis and sepsis.  Initial temperature was elevated at 102.6 F with leukocytosis at 20K.  Patient did have significant lactic acidosis.  Blood cultures were obtained patient patient received IV fluid boluses and was initially admitted to the stepdown unit.  Patient also tested positive for influenza.    Assessment & Plan:  Severe sepsis due to LLE cellulitis:  Present on admission.  Received multiple fluid boluses.  Significantly elevated lactate on presentation which has improved with IV fluid hydration.  Lower extremity ultrasound was negative for DVT.  ESR was elevated.  Blood cultures negative so far. X-ray of the left foot with subcutaneous soft tissue edema and emphysema with no definite radiographic evidence of osteomyelitis.  We will continue with broad-spectrum antibiotic for now.   WBC elevated at 14.0<15.4< 18.. T-max of 99.23F.  Spoke with Dr. Sharol Given, orthopedic yesterday who recommended antibiotics and outpatient follow-up in the clinic in 1 week. Continue IV Zosyn for now.   Influenza A infection:  Continue Tamiflu.    Lactic acidosis:  Likely secondary to infection and volume depletion.  Initial lactate of 8.3.  Has normalized to 1.2.  Hypotension:  Hypertensives on hold.  Patient is refusing medications as well.  Blood pressure has improved   AKI/azotemia:  Improved with IV hydration.  Monitor BMP closely.  Latest creatinine of 0.9  Hypokalemia:  Replenish orally if possible.  Check levels in a.m.  Uncontrolled NIDDM-2 with hyperglycemia, diabetic  neuropathy:  Latest A1c 9.0%.  Continue sliding scale insulin.  Continue gabapentin and Lipitor.  POC glucose of 127.  Normocytic anemia:  hemoglobin at 11.  Continue to monitor closely.   Vascular dementia with behavioral disturbance/history of depression and adjustment disorder: Continue Depakote, Wellbutrin and Zoloft.  Likely at baseline.  Psychiatry has been consulted due to refusal to treatment plan, history of vascular dementia with adjustment disorder to optimize his condition.   Physical deconditioning/debility Seen by physical, occupational therapy evaluation recommended skilled nursing facility placement on discharge.   Concern about dysphagia: Speech therapy on board, currently recommending regular consistency diet  Disposition.   Patient is from skilled nursing facility. Likely disposition plan to skilled nursing facility when improved.  DVT prophylaxis:  enoxaparin (LOVENOX) injection 40 mg Start: 08/25/21 2200  Code Status: Full code  Family Communication:  Spoke with the patient's son on the phone on 08/30/2021 and updated him about the clinical condition of the patient .  Status is: Inpatient  Remains inpatient appropriate because: Severe sepsis due to LLE cellulitis, requiring IV antibiotics and further evaluation  Consultants:  Spoke with Dr Sharol Given orthopedics  Psychiatry  Procedures:  Wound care  Sch Meds:  Scheduled Meds:  atorvastatin  40 mg Oral Daily   buPROPion  75 mg Oral BID   chlorhexidine  15 mL Mouth Rinse BID   Chlorhexidine Gluconate Cloth  6 each Topical Daily   divalproex  125 mg Oral QHS   enoxaparin (LOVENOX) injection  40 mg Subcutaneous Q24H   feeding supplement  237 mL Oral BID BM   gabapentin  100 mg Oral TID   Gerhardt's butt cream  Topical TID   hydrocerin   Topical BID   insulin aspart  0-15 Units Subcutaneous TID WC   insulin aspart  0-5 Units Subcutaneous QHS   mouth rinse  15 mL Mouth Rinse q12n4p   midodrine  5 mg Oral  TID WC   multivitamin with minerals  1 tablet Oral Daily   sertraline  200 mg Oral Daily   sodium chloride flush  3 mL Intravenous Q12H   Continuous Infusions:  piperacillin-tazobactam (ZOSYN)  IV 3.375 g (08/31/21 0601)   PRN Meds:.acetaminophen **OR** acetaminophen, albuterol, fentaNYL (SUBLIMAZE) injection, food thickener, ondansetron **OR** ondansetron (ZOFRAN) IV, oxyCODONE, polyethylene glycol  Antimicrobials:  Zosyn IV 1/13>  Anti-infectives (From admission, onward)    Start     Dose/Rate Route Frequency Ordered Stop   08/26/21 2100  piperacillin-tazobactam (ZOSYN) IVPB 3.375 g        3.375 g 12.5 mL/hr over 240 Minutes Intravenous Every 8 hours 08/26/21 1443     08/26/21 1600  vancomycin (VANCOREADY) IVPB 1250 mg/250 mL  Status:  Discontinued        1,250 mg 166.7 mL/hr over 90 Minutes Intravenous 2 times daily 08/26/21 1507 08/28/21 1348   08/26/21 1000  oseltamivir (TAMIFLU) capsule 30 mg  Status:  Discontinued       See Hyperspace for full Linked Orders Report.   30 mg Oral 2 times daily 08/25/21 1936 08/26/21 0723   08/26/21 1000  oseltamivir (TAMIFLU) capsule 75 mg  Status:  Discontinued       See Hyperspace for full Linked Orders Report.   75 mg Oral 2 times daily 08/26/21 0723 08/26/21 0724   08/26/21 1000  oseltamivir (TAMIFLU) capsule 75 mg        75 mg Oral 2 times daily 08/26/21 0724 08/31/21 0959   08/25/21 2100  vancomycin (VANCOREADY) IVPB 1750 mg/350 mL  Status:  Discontinued        1,750 mg 175 mL/hr over 120 Minutes Intravenous Every 24 hours 08/25/21 1942 08/26/21 1507   08/25/21 2000  metroNIDAZOLE (FLAGYL) IVPB 500 mg  Status:  Discontinued        500 mg 100 mL/hr over 60 Minutes Intravenous Every 12 hours 08/25/21 1932 08/26/21 1443   08/25/21 2000  ceFEPIme (MAXIPIME) 2 g in sodium chloride 0.9 % 100 mL IVPB  Status:  Discontinued        2 g 200 mL/hr over 30 Minutes Intravenous Every 8 hours 08/25/21 1942 08/26/21 1443   08/25/21 1945   oseltamivir (TAMIFLU) capsule 75 mg  Status:  Discontinued       See Hyperspace for full Linked Orders Report.   75 mg Oral  Once 08/25/21 1936 08/26/21 0724   08/25/21 1545  cefTRIAXone (ROCEPHIN) 2 g in sodium chloride 0.9 % 100 mL IVPB  Status:  Discontinued        2 g 200 mL/hr over 30 Minutes Intravenous Every 24 hours 08/25/21 1535 08/25/21 1932     Subjective:  Today, patient was seen and examined at bedside.  Had refused medications yesterday.  Does not really want to talk much.  He however got his antibiotic.  Complains of mild leg pain but better.  Denies any nausea vomiting.  Objective: Vitals:   08/27/21 1931 08/27/21 2000 08/27/21 2337 08/28/21 0554  BP: 122/64 (!) 98/53 (!) 148/81 133/78  Pulse: 78 77 83 82  Resp: 18 (!) 27 (!) 24 15  Temp: 98.5 F (36.9 C)  99.8 F (37.7 C) 98.8 F (37.1  C)  TempSrc: Oral  Oral Oral  SpO2: 94% 94% 97% 93%  Weight:      Height:        Physical examination: General:  Average built, not in obvious distress, closing eyes, appears frustrated at times. HENT:   No scleral pallor or icterus noted. Oral mucosa is moist.  Chest:    Diminished breath sounds bilaterally. No crackles or wheezes.  CVS: S1 &S2 heard. No murmur.  Regular rate and rhythm. Abdomen: Soft, nontender, nondistended.  Bowel sounds are heard.   Extremities: Status post right first toe amputation.  Second toe discoloration on the right foot.  Status post left great toe amputation with erythema edema over the lower leg.  Erythema improving. Psych: Alert, awake and communicative.  Moves all extremities. CNS:  No cranial nerve deficits.  Power equal in all extremities.   Skin: Warm and dry.  Bilateral lower extremity with dressing.  I have personally reviewed the following labs and imaging studies.  CBC: Recent Labs  Lab 08/25/21 1520 08/26/21 0532 08/27/21 0307 08/28/21 0909 08/29/21 0416 08/30/21 0427 08/31/21 0901  WBC 21.6*   < > 16.8* 17.8* 18.1* 15.4*  14.0*  NEUTROABS 19.5*  --   --   --   --   --   --   HGB 13.9   < > 11.1* 11.1* 10.4* 10.8* 11.0*  HCT 41.5   < > 34.5* 34.6* 32.2* 33.5* 33.3*  MCV 87.9   < > 90.6 91.3 90.2 90.3 89.3  PLT 281   < > 250 342 370 381 410*   < > = values in this interval not displayed.    BMP &GFR Recent Labs  Lab 08/26/21 0532 08/27/21 0307 08/28/21 0444 08/28/21 0909 08/29/21 0416 08/30/21 0427 08/31/21 0901  NA 136 139  --  140 140 138 140  K 3.1* 3.7  --  3.7 3.6 3.4* 3.4*  CL 101 109  --  111 109 107 105  CO2 25 23  --  _0 GLUCOSE 165* 110*  --  176* 171* 198* 139*  BUN 25* 26*  --  _1 CREATININE 1.02 1.08   1.11 1.02 0.97 1.06 0.91 0.90  CALCIUM 7.6* 7.5*  --  7.6* 7.8* 7.6* 7.5*  MG 1.7 2.1  --  2.2 2.0 1.9  --   PHOS  --  2.6  --   --   --   --   --     Estimated Creatinine Clearance: 93.6 mL/min (by C-G formula based on SCr of 0.9 mg/dL). Liver & Pancreas: Recent Labs  Lab 08/25/21 1520 08/26/21 0532 08/27/21 0307 08/29/21 0416  AST 50* 40  --  33  ALT 32 25  --  21  ALKPHOS 78 60  --  83  BILITOT 1.2 0.9  --  0.7  PROT 7.6 5.5*  --  5.6*  ALBUMIN 3.0* 2.1* 2.1* 1.8*    No results for input(s): LIPASE, AMYLASE in the last 168 hours. No results for input(s): AMMONIA in the last 168 hours. Diabetic: No results for input(s): HGBA1C in the last 72 hours. Recent Labs  Lab 08/30/21 0734 08/30/21 1152 08/30/21 1626 08/30/21 2310 08/31/21 0803  GLUCAP 191* 171* 149* 139* 127*    Cardiac Enzymes: Recent Labs  Lab 08/27/21 0307  CKTOTAL 245    No results for input(s): PROBNP in the last 8760 hours. Coagulation Profile: Recent Labs  Lab 08/25/21 1520 08/26/21 0532  INR 1.1 1.3*    Thyroid Function Tests: No results for input(s): TSH, T4TOTAL, FREET4, T3FREE, THYROIDAB in the last 72 hours. Lipid Profile: No results for input(s): CHOL, HDL, LDLCALC, TRIG, CHOLHDL, LDLDIRECT in the last 72 hours. Anemia Panel: No results for input(s):  VITAMINB12, FOLATE, FERRITIN, TIBC, IRON, RETICCTPCT in the last 72 hours. Urine analysis:    Component Value Date/Time   COLORURINE AMBER (A) 08/25/2021 1833   APPEARANCEUR HAZY (A) 08/25/2021 1833   LABSPEC 1.024 08/25/2021 1833   PHURINE 5.0 08/25/2021 1833   GLUCOSEU >=500 (A) 08/25/2021 1833   HGBUR MODERATE (A) 08/25/2021 1833   BILIRUBINUR NEGATIVE 08/25/2021 1833   KETONESUR 5 (A) 08/25/2021 1833   PROTEINUR >=300 (A) 08/25/2021 1833   UROBILINOGEN 1.0 04/20/2010 1105   NITRITE NEGATIVE 08/25/2021 1833   LEUKOCYTESUR NEGATIVE 08/25/2021 1833   Sepsis Labs: Invalid input(s): PROCALCITONIN, [place]  Microbiology: Recent Results (from the past 240 hour(s))  Blood Culture (routine x 2)     Status: None   Collection Time: 08/25/21  3:20 PM   Specimen: BLOOD  Result Value Ref Range Status   Specimen Description   Final    BLOOD RIGHT ANTECUBITAL Performed at [place] [address], [place], [place] [address]    Special Requests   Final    BOTTLES DRAWN AEROBIC AND ANAEROBIC Blood Culture results may not be optimal due to an excessive volume of blood received in culture bottles Performed at [place] [address] [place], [place] [address]    Culture   Final    NO GROWTH 5 DAYS Performed at [facility], [place] [address], [place], [place] [address]    Report Status 08/30/2021 FINAL  Final  Blood Culture (routine x 2)     Status: None   Collection Time: 08/25/21  3:20 PM   Specimen: BLOOD  Result Value Ref Range Status   Specimen Description   Final    BLOOD LEFT ANTECUBITAL Performed at [facility] [place] [address], [place], [place] [address]    Special Requests   Final    BOTTLES DRAWN AEROBIC AND ANAEROBIC Blood Culture results may not be optimal due to an excessive volume of blood received in culture bottles Performed at [place] [address] [place],  [place] [address]    Culture   Final    NO GROWTH 5 DAYS Performed at [facility], [place] [address], [place], [place] [address]    Report Status 08/30/2021 FINAL  Final  Resp Panel by RT-PCR (Flu A&B, Covid) Nasopharyngeal Swab     Status: Abnormal   Collection Time: 08/25/21  3:30 PM   Specimen: Nasopharyngeal Swab; Nasopharyngeal(NP) swabs in vial transport medium  Result Value Ref Range Status   SARS Coronavirus 2 by RT PCR NEGATIVE NEGATIVE Final    Comment: (NOTE) SARS-CoV-2 target nucleic acids are NOT DETECTED.  The SARS-CoV-2 RNA is generally detectable in upper respiratory specimens during the acute phase of infection. The lowest concentration of SARS-CoV-2 viral copies this assay can detect is 138 copies/mL. A negative result does not preclude SARS-Cov-2 infection and should not be used as the sole basis for treatment or other patient management decisions. A negative result may occur with  improper specimen collection/handling, submission of specimen other than nasopharyngeal swab, presence of viral mutation(s) within the areas targeted by this assay, and inadequate number of viral copies(<138 copies/mL). A negative result must be combined  with clinical observations, patient history, and epidemiological information. The expected result is Negative.  Fact Sheet for Patients:  EntrepreneurPulse.com.au  Fact Sheet for Healthcare Providers:  IncredibleEmployment.be  This test is no t yet approved or cleared by the [place] FDA and  has been authorized for detection and/or diagnosis of SARS-CoV-2 by FDA under an Emergency Use Authorization (EUA). This EUA will remain  in effect (meaning this test can be used) for the duration of the COVID-19 declaration under Section 564(b)(1) of the Act, 21 U.S.C.section 360bbb-3(b)(1), unless the authorization is terminated  or revoked sooner.       Influenza A by PCR POSITIVE (A) NEGATIVE Final    Influenza B by PCR NEGATIVE NEGATIVE Final    Comment: (NOTE) The Xpert Xpress SARS-CoV-2/FLU/RSV plus assay is intended as an aid in the diagnosis of influenza from Nasopharyngeal swab specimens and should not be used as a sole basis for treatment. Nasal washings and aspirates are unacceptable for Xpert Xpress SARS-CoV-2/FLU/RSV testing.  Fact Sheet for Patients: EntrepreneurPulse.com.au  Fact Sheet for Healthcare Providers: IncredibleEmployment.be  This test is not yet approved or cleared by the [place] FDA and has been authorized for detection and/or diagnosis of SARS-CoV-2 by FDA under an Emergency Use Authorization (EUA). This EUA will remain in effect (meaning this test can be used) for the duration of the COVID-19 declaration under Section 564(b)(1) of the Act, 21 U.S.C. section 360bbb-3(b)(1), unless the authorization is terminated or revoked.  Performed at [facility], [place] [address] [place], [place] [address]   Urine Culture     Status: None   Collection Time: 08/25/21  6:33 PM   Specimen: In/Out Cath Urine  Result Value Ref Range Status   Specimen Description   Final    IN/OUT CATH URINE Performed at [facility] [address], [place], [place] [address]    Special Requests   Final    NONE Performed at [facility], [place] [address] [place], [place] [address]    Culture   Final    NO GROWTH Performed at [facility], [place] [address] [place], [place] [address]    Report Status 08/26/2021 FINAL  Final  MRSA Next Gen by PCR, Nasal     Status: None   Collection Time: 08/25/21 11:20 PM   Specimen: Nasal Mucosa; Nasal Swab  Result Value Ref Range Status   MRSA by PCR Next Gen NOT DETECTED NOT DETECTED Final    Comment: (NOTE) The GeneXpert MRSA Assay (FDA approved for NASAL specimens only), is one component of a comprehensive MRSA colonization  surveillance program. It is not intended to diagnose MRSA infection nor to guide or monitor treatment for MRSA infections. Test performance is not FDA approved in patients less than 53 years old. Performed at [facility], [place] [address] [place], [place] [address]     Radiology Studies: No results found.  Oscar La, MD [facility] If 7PM-7AM, please contact night-coverage [email or web address] 08/31/2021, 10:00 AM

## 2021-08-31 NOTE — Progress Notes (Signed)
Physical Therapy Treatment Patient Details Name: Bryan Farrell MRN: [id number] DOB: [birth year] Today's Date: 08/31/2021   History of Present Illness 71 year old Male with past medical history of vascular dementia, diabetes mellitus type 2,  remote left great toe osteomyelitis s/p amputation and depression was brought into the hospital from nursing home with increasing left lower extremity redness swelling.  Patient was then admitted hospital for severe cellulitis and sepsis.    PT Comments    Very limited session. He can barely tolerate movement of L LE. Pt declined mobility despite offers/encouragement. He did move his UEs and R LE around a few times on my request. Multiple "I don't care" responses from patient during session. Recommend return to SNF.     Recommendations for follow up therapy are one component of a multi-disciplinary discharge planning process, led by the attending physician.  Recommendations may be updated based on patient status, additional functional criteria and insurance authorization.  Follow Up Recommendations  Skilled nursing-short term rehab (<3 hours/day)     Assistance Recommended at Discharge Frequent or constant Supervision/Assistance  Patient can return home with the following Two people to help with walking and/or transfers;Two people to help with bathing/dressing/bathroom   Equipment Recommendations   (TBD at next venue)    Recommendations for Other Services       Precautions / Restrictions Precautions Precautions: Fall Precaution Comments: painful LLE, be careful with toes Restrictions Weight Bearing Restrictions: No     Mobility  Bed Mobility               General bed mobility comments: NT-pt declined to participate despite encouragement    Transfers                        Ambulation/Gait                   Stairs             Wheelchair Mobility    Modified Rankin (Stroke Patients Only)        Balance                                            Cognition Arousal/Alertness: Awake/alert Behavior During Therapy:  (mildly agitated) Overall Cognitive Status: History of cognitive impairments - at baseline                                 General Comments: several "I don't care" responses        Exercises General Exercises - Upper Extremity Shoulder Flexion:  (3 reps bil UE shoulder flexion) General Exercises - Lower Extremity Straight Leg Raises:  (pt performed 2 reps of  SLR on R side only)    General Comments        Pertinent Vitals/Pain Pain Assessment Pain Assessment: Faces Faces Pain Scale: Hurts whole lot Pain Location: L LE especially with movement Pain Descriptors / Indicators: Grimacing, Guarding, Moaning Pain Intervention(s): Limited activity within patient's tolerance    Home Living                          Prior Function            PT Goals (current goals can now be found in the care plan  section)      Frequency    Min 2X/week      PT Plan Current plan remains appropriate    Co-evaluation              AM-PAC PT "6 Clicks" Mobility   Outcome Measure  Help needed turning from your back to your side while in a flat bed without using bedrails?: Total Help needed moving from lying on your back to sitting on the side of a flat bed without using bedrails?: Total Help needed moving to and from a bed to a chair (including a wheelchair)?: Total Help needed standing up from a chair using your arms (e.g., wheelchair or bedside chair)?: Total Help needed to walk in hospital room?: Total Help needed climbing 3-5 steps with a railing? : Total 6 Click Score: 6    End of Session   Activity Tolerance: Patient limited by pain Patient left: in bed;with call bell/phone within reach;with bed alarm set   PT Visit Diagnosis: Muscle weakness (generalized) (M62.81);History of falling (Z91.81);Difficulty in  walking, not elsewhere classified (R26.2);Pain Pain - Right/Left: Left Pain - part of body: Leg     Time: 1050-1058 PT Time Calculation (min) (ACUTE ONLY): 8 min  Charges:  $Therapeutic Exercise: 8-22 mins                         Doreatha Massed, PT Acute Rehabilitation  Office: [phone] Pager: [phone]

## 2021-08-31 NOTE — NC FL2 (Signed)
[place] LEVEL OF CARE SCREENING TOOL     IDENTIFICATION  Patient Name: Bryan Farrell Birthdate: Feb 16, 1951 Sex: male Admission Date (Current Location): 08/25/2021  [place] and [place] Number:  Kathleen Argue [id number] [place] and Address:  [facility],  [place] [address], [place]      Provider Number: [id number]  Attending Physician Name and Address:  Flora Lipps, MD  Relative Name and Phone Number:  Arren Edlund son [phone]    Current Level of Care: Hospital Recommended Level of Care: Other (Comment) (LTC) Prior Approval Number:    Date Approved/Denied:   PASRR Number: [id number]  Discharge Plan: Other (Comment) (LTC)    Current Diagnoses: Patient Active Problem List   Diagnosis Date Noted   Sepsis due to cellulitis ([place]) 08/25/2021   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[place])    Bacteremia due to Enterococcus 05/29/2019   Toe osteomyelitis, left (HCC)    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[place])    Mild protein-calorie malnutrition ([place])    Osteomyelitis ([place]) 05/27/2019   Moderate recurrent major depression (HCC)    Adjustment disorder with depressed mood 04/13/2017   Vitamin B12 deficiency 03/25/2017   Vascular dementia ([place]) 03/23/2017   Suicidal ideation 03/23/2017   Homelessness 03/23/2017   Diabetes ([place]) 03/23/2017    Orientation RESPIRATION BLADDER Height & Weight     Self, Time, Situation  O2 Incontinent Weight: 94 kg Height:  6' (182.9 cm)  BEHAVIORAL SYMPTOMS/MOOD NEUROLOGICAL BOWEL NUTRITION STATUS      Incontinent Diet (Heart Healthy)  AMBULATORY STATUS COMMUNICATION OF NEEDS Skin   Limited Assist Verbally PU Stage and Appropriate Care (Wound care see MAR)   PU Stage 2 Dressing: Daily                   Personal Care Assistance Level of Assistance  Bathing, Feeding, Dressing Bathing Assistance: Limited assistance Feeding assistance: Limited assistance Dressing Assistance: Limited  assistance     Functional Limitations Info  Sight, Hearing, Speech Sight Info: Adequate Hearing Info: Adequate Speech Info: Adequate    SPECIAL CARE FACTORS FREQUENCY                       Contractures Contractures Info: Not present    Additional Factors Info  Code Status, Allergies, Insulin Sliding Scale, Psychotropic Code Status Info:  (Full) Allergies Info:  (NKA) Psychotropic Info:  (Wellbutrin-see MAR) Insulin Sliding Scale Info:  (SSI)       Current Medications (08/31/2021):  This is the current hospital active medication list Current Facility-Administered Medications  Medication Dose Route Frequency Provider Last Rate Last Admin   acetaminophen (TYLENOL) tablet 650 mg  650 mg Oral Q6H PRN Mercy Riding, MD       Or   acetaminophen (TYLENOL) suppository 650 mg  650 mg Rectal Q6H PRN Wendee Beavers T, MD   650 mg at 08/26/21 1433   albuterol (PROVENTIL) (2.5 MG/3ML) 0.083% nebulizer solution 2.5 mg  2.5 mg Nebulization Q4H PRN Pokhrel, Laxman, MD   2.5 mg at 08/28/21 0857   atorvastatin (LIPITOR) tablet 40 mg  40 mg Oral Daily Wendee Beavers T, MD   40 mg at 08/29/21 0913   buPROPion (WELLBUTRIN) tablet 75 mg  75 mg Oral BID Wendee Beavers T, MD   75 mg at 08/29/21 2333   chlorhexidine (PERIDEX) 0.12 % solution 15 mL  15 mL Mouth Rinse BID Gonfa, Taye T, MD   15 mL  at 08/29/21 2334   Chlorhexidine Gluconate Cloth 2 % PADS 6 each  6 each Topical Daily Mercy Riding, MD   6 each at 08/28/21 0916   divalproex (DEPAKOTE) DR tablet 125 mg  125 mg Oral QHS Wendee Beavers T, MD   125 mg at 08/29/21 2334   enoxaparin (LOVENOX) injection 40 mg  40 mg Subcutaneous Q24H Wendee Beavers T, MD   40 mg at 08/30/21 2213   feeding supplement (ENSURE ENLIVE / ENSURE PLUS) liquid 237 mL  237 mL Oral BID BM Gonfa, Taye T, MD   237 mL at 08/29/21 2334   fentaNYL (SUBLIMAZE) injection 12.5 mcg  12.5 mcg Intravenous Q2H PRN Wendee Beavers T, MD   12.5 mcg at 08/27/21 1829   food thickener (SIMPLYTHICK  (NECTAR/LEVEL 2/MILDLY THICK)) 10 packet  10 packet Oral PRN Pokhrel, Laxman, MD       gabapentin (NEURONTIN) capsule 100 mg  100 mg Oral TID Pokhrel, Laxman, MD   100 mg at 08/29/21 2334   Gerhardt's butt cream   Topical TID Mercy Riding, MD   Given at 08/30/21 2218   hydrocerin (EUCERIN) cream   Topical BID Mercy Riding, MD   Given at 08/30/21 2218   insulin aspart (novoLOG) injection 0-15 Units  0-15 Units Subcutaneous TID WC Wendee Beavers T, MD   2 Units at 08/31/21 1226   insulin aspart (novoLOG) injection 0-5 Units  0-5 Units Subcutaneous QHS Gonfa, Taye T, MD       MEDLINE mouth rinse  15 mL Mouth Rinse q12n4p Gonfa, Taye T, MD   15 mL at 08/28/21 1741   midodrine (PROAMATINE) tablet 5 mg  5 mg Oral TID WC Wendee Beavers T, MD   5 mg at 08/29/21 1755   multivitamin with minerals tablet 1 tablet  1 tablet Oral Daily Wendee Beavers T, MD   1 tablet at 08/29/21 0913   ondansetron (ZOFRAN) tablet 4 mg  4 mg Oral Q6H PRN Mercy Riding, MD       Or   ondansetron (ZOFRAN) injection 4 mg  4 mg Intravenous Q6H PRN Wendee Beavers T, MD   4 mg at 08/31/21 [id number]   oxyCODONE (Oxy IR/ROXICODONE) immediate release tablet 5 mg  5 mg Oral Q6H PRN Pokhrel, Laxman, MD   5 mg at 08/29/21 1755   piperacillin-tazobactam (ZOSYN) IVPB 3.375 g  3.375 g Intravenous Q8H Gonfa, Taye T, MD 12.5 mL/hr at 08/31/21 0601 3.375 g at 08/31/21 0601   polyethylene glycol (MIRALAX / GLYCOLAX) packet 17 g  17 g Oral Daily PRN Wendee Beavers T, MD       sertraline (ZOLOFT) tablet 200 mg  200 mg Oral Daily Gonfa, Taye T, MD   200 mg at 08/29/21 0913   sodium chloride flush (NS) 0.9 % injection 3 mL  3 mL Intravenous Q12H Mercy Riding, MD   3 mL at 08/30/21 2219     Discharge Medications: Please see discharge summary for a list of discharge medications.  Relevant Imaging Results:  Relevant Lab Results:   Additional Information SS#[id number]  Dessa Phi, RN

## 2021-08-31 NOTE — Progress Notes (Signed)
pt has an emesis episode. look like bile. Unmeasurable - on bed and floor On call APP Thompson Grayer Zofran given. Pt bathed

## 2021-09-01 LAB — CBC
HCT: 33.1 % — ABNORMAL LOW (ref 39.0–52.0)
Hemoglobin: 11 g/dL — ABNORMAL LOW (ref 13.0–17.0)
MCH: 29.4 pg (ref 26.0–34.0)
MCHC: 33.2 g/dL (ref 30.0–36.0)
MCV: 88.5 fL (ref 80.0–100.0)
Platelets: 418 10*3/uL — ABNORMAL HIGH (ref 150–400)
RBC: 3.74 MIL/uL — ABNORMAL LOW (ref 4.22–5.81)
RDW: 13.6 % (ref 11.5–15.5)
WBC: 13.2 10*3/uL — ABNORMAL HIGH (ref 4.0–10.5)
nRBC: 0 % (ref 0.0–0.2)

## 2021-09-01 LAB — BASIC METABOLIC PANEL
Anion gap: 8 (ref 5–15)
BUN: 14 mg/dL (ref 8–23)
CO2: 29 mmol/L (ref 22–32)
Calcium: 7.6 mg/dL — ABNORMAL LOW (ref 8.9–10.3)
Chloride: 102 mmol/L (ref 98–111)
Creatinine, Ser: 0.9 mg/dL (ref 0.61–1.24)
GFR, Estimated: >60 mL/min (ref >60)
Glucose, Bld: 112 mg/dL — ABNORMAL HIGH (ref 70–99)
Potassium: 3.5 mmol/L (ref 3.5–5.1)
Sodium: 139 mmol/L (ref 135–145)

## 2021-09-01 LAB — RESP PANEL BY RT-PCR (FLU A&B, COVID) ARPGX2
Influenza A by PCR: POSITIVE — AB
Influenza B by PCR: NEGATIVE
SARS Coronavirus 2 by RT PCR: NEGATIVE

## 2021-09-01 LAB — GLUCOSE, CAPILLARY
Glucose-Capillary: 111 mg/dL — ABNORMAL HIGH (ref 70–99)
Glucose-Capillary: 142 mg/dL — ABNORMAL HIGH (ref 70–99)
Glucose-Capillary: 144 mg/dL — ABNORMAL HIGH (ref 70–99)
Glucose-Capillary: 90 mg/dL (ref 70–99)

## 2021-09-01 MED ORDER — ENSURE ENLIVE PO LIQD: 237.0000 mL | Freq: Every day | ORAL | Status: DC | PRN

## 2021-09-01 MED ORDER — AMOXICILLIN-POT CLAVULANATE 875-125 MG PO TABS
1.0000 | ORAL_TABLET | Freq: Two times a day (BID) | ORAL | Status: DC
  Administered 2021-09-01 – 2021-09-02 (×4): 1 via ORAL
  Filled 2021-09-01 (×4): qty 1

## 2021-09-01 NOTE — Progress Notes (Signed)
Dr. Tyson Babinski made aware that patient has refused most of his oral medications today. Anvi Mangal, Yancey Flemings, RN

## 2021-09-01 NOTE — Progress Notes (Signed)
Pt did not take meds: Bupropion and Gabapentin pills. Pt also refused Ensure beverage... pt was offered food and drink but has declined each time. This is the 3rd day that pt has requested food but did not eat - no reasoning for refusing food was given  Pt has slept intermittently throughout the shift.

## 2021-09-01 NOTE — Progress Notes (Signed)
Pt took pills whole in pt's request of chocolate ice cream. When RN did oral care, pt spat out partly chewed Augmentin. Pt stated he was unable to swallow it. RN had observed pt attempting to swallow pills.

## 2021-09-01 NOTE — Progress Notes (Signed)
Nutrition Follow-up  DOCUMENTATION CODES:   Not applicable  INTERVENTION:  - will change Ensure order from BID to once/day PRN. - weigh patient today, if feasible.  - if patient to remain Full Code, recommend small bore NGT placement and initiation of TF.   NUTRITION DIAGNOSIS:   Increased nutrient needs related to acute illness (sepsis) as evidenced by estimated needs. -ongoing  GOAL:   Patient will meet greater than or equal to 90% of their needs -unmet  MONITOR:   PO intake, Labs, Weight trends, Skin  ASSESSMENT:   71 y.o. male with medical history of vascular dementia, DM, history of L toe osteomyelitis and amputation, and adjustment disorder. He presented to the ED from nursing facility d/t concern surrounding redness and swelling to LLE. In the ED he was noted to be a/o x3.  Able to talk with Tech prior to visit to patient's room. She reports that patient ate a few bites of pancake for breakfast this AM but otherwise refusing PO intakes this AM.  Able to talk with RN after visit to patient's room. She also had patient on 1/15. She reports that patient continues to refuse foods and beverages at meals, refusing Ensure, refusing medications. She was able to get him to drink some milk and take a few bites of applesauce with medications and able to get him to take a bite of breakfast potatoes on 1/15.   Discharge plan is currently unclear.   Patient laying in bed with no visitors present at the time of RD visit. Breakfast tray on bedside table and it appears he ate 1 pancake; other pancake, sausage, and grits untouched.   He denies abdominal pain, pressure, or nausea. He denies pain with chewing, oral pain, or pain with swallowing.   He reports that he has had a poor/decreased appetite for unknown amount of time since PTA.   Patient became increasingly short and upset with RD so visit was brief and NFPE was deferred.   He has not been weighed since admission on 1/12.  Non-pitting edema to LLE documented in the edema section of flow sheet.    Labs reviewed; CBG: 111 mg/dl, Ca: 7.6 mg/dl.  Medications reviewed; sliding scale novolog, 3 mg melatonin/night, 1 tablet multivitamin with minerals/day, 200 mg IV thiamine TID from 1/18-1/20.   NUTRITION - FOCUSED PHYSICAL EXAM:  Patient became increasingly agitated during brief RD visit; deferred.   Diet Order:   Diet Order             Diet heart healthy/carb modified Room service appropriate? Yes; Fluid consistency: Thin  Diet effective now                   EDUCATION NEEDS:   Not appropriate for education at this time  Skin:  Skin Assessment: Reviewed RN Assessment  Last BM:  1/16 (type 6, medium amount)  Height:   Ht Readings from Last 1 Encounters:  08/25/21 6' (1.829 m)    Weight:   Wt Readings from Last 1 Encounters:  08/25/21 94 kg     Estimated Nutritional Needs:  Kcal:  2000-2200 kcal Protein:  100-115 grams Fluid:  >/= 2.2 L/day      Trenton Gammon, MS, RD, LDN Inpatient Clinical Dietitian RD pager # available in [facility]  After hours/weekend pager # available in [facility]

## 2021-09-01 NOTE — Progress Notes (Addendum)
PROGRESS NOTE  Bryan Farrell UUV:[id number] DOB: June 07, 1951   PCP: Garwin Brothers, MD  Brief Narrative:  71 year old Male with past medical history of vascular dementia, diabetes mellitus type 2,  remote left great toe osteomyelitis s/p amputation and depression was brought into the hospital from nursing home with increasing left lower extremity redness, swelling.  Patient was then admitted to the hospital for severe cellulitis and sepsis.  Initial temperature was elevated at 102.6 F with leukocytosis at 20K.  Patient did have significant lactic acidosis.  Blood cultures were obtained patient patient received IV fluid boluses and was initially admitted to the stepdown unit.  Patient also tested positive for influenza.    During hospitalization patient intermittently refused medications and treatment plan.  He appears to be withdrawn frustrated and psychiatry was consulted.  Assessment & Plan:  Severe sepsis due to LLE cellulitis:  Present on admission.  Received multiple fluid boluses initially..  Significantly elevated lactate on presentation which has improved with IV fluid hydration.  Lower extremity ultrasound was negative for DVT.  ESR was elevated.  Blood cultures negative so far. X-ray of the left foot with subcutaneous soft tissue edema and emphysema with no definite radiographic evidence of osteomyelitis.  On Zosyn at this time.   WBC elevated at 13.2<14.0<15.4< 18.. T-max of 98.27F.  Spoke with Dr. Sharol Given, orthopedics who recommended antibiotics and outpatient follow-up in the clinic in 1 week.  Improving cellulitis at this time.  We will continue to follow-up 7 to 10-day course of antibiotic.  Will change to Augmentin from today.  Influenza A infection:  Continue Tamiflu.    Lactic acidosis:  Likely secondary to infection and volume depletion.  Initial lactate of 8.3.  Has normalized to 1.2.  Hypotension:  Not on antihypertensives at home.  We will continue to monitor closely.    AKI/azotemia:  Improved with IV hydration.  Monitor BMP closely.  Latest creatinine of 0.9  Hypokalemia:  Improved with replacement.  Potassium of 3.5 today.  Uncontrolled NIDDM-2 with hyperglycemia, diabetic neuropathy:  Latest hemoglobin A1c 9.0%.  Continue sliding scale insulin.  Continue gabapentin and Lipitor.  POC glucose relatively controlled at this time.  Normocytic anemia:  Latest hemoglobin at 11.  No evidence of bleeding.   Vascular dementia with behavioral disturbance/history of depression and adjustment disorder:  Psychiatry was consulted due to refusal to treatment plan, history of vascular dementia with adjustment disorder .  Progression has been changed to IV and thiamine has been added as per psychiatry.  Continue sertraline.  Physical deconditioning/debility Seen by physical, occupational therapy evaluation- recommended skilled nursing facility placement on discharge.   Concern about dysphagia: Speech therapy  recommending regular consistency diet  Disposition.   Patient is from skilled nursing facility.  Disposition to skilled nursing facility i likely tomorrow.  Follow psychiatric recommendations.  We will send a COVID swab today.  DVT prophylaxis:  enoxaparin (LOVENOX) injection 40 mg Start: 08/25/21 2200  Code Status: Full code  Family Communication:  Spoke with the patient's son on the phone on 08/30/2021   Status is: Inpatient  Remains inpatient appropriate because: Severe sepsis due to LLE cellulitis, requiring IV antibiotics and further evaluation  Consultants:  Spoke with Dr Sharol Given orthopedics  Psychiatry  Procedures:  Wound care  Sch Meds:  Scheduled Meds:  atorvastatin  40 mg Oral Daily   buPROPion  75 mg Oral BID   chlorhexidine  15 mL Mouth Rinse BID   enoxaparin (LOVENOX) injection  40 mg Subcutaneous Q24H  feeding supplement  237 mL Oral BID BM   gabapentin  100 mg Oral TID   Gerhardt's butt cream   Topical TID   hydrocerin    Topical BID   insulin aspart  0-15 Units Subcutaneous TID WC   insulin aspart  0-5 Units Subcutaneous QHS   mouth rinse  15 mL Mouth Rinse q12n4p   melatonin  3 mg Oral q1800   midodrine  5 mg Oral TID WC   multivitamin with minerals  1 tablet Oral Daily   sertraline  200 mg Oral Daily   sodium chloride flush  3 mL Intravenous Q12H   Continuous Infusions:  piperacillin-tazobactam (ZOSYN)  IV 3.375 g (09/01/21 0513)   thiamine injection 200 mg (09/01/21 0422)   valproate sodium 125 mg (08/31/21 2214)   PRN Meds:.acetaminophen **OR** acetaminophen, albuterol, fentaNYL (SUBLIMAZE) injection, food thickener, ondansetron **OR** ondansetron (ZOFRAN) IV, oxyCODONE, polyethylene glycol  Antimicrobials:  Zosyn IV 1/13>  Anti-infectives (From admission, onward)    Start     Dose/Rate Route Frequency Ordered Stop   08/26/21 2100  piperacillin-tazobactam (ZOSYN) IVPB 3.375 g        3.375 g 12.5 mL/hr over 240 Minutes Intravenous Every 8 hours 08/26/21 1443     08/26/21 1600  vancomycin (VANCOREADY) IVPB 1250 mg/250 mL  Status:  Discontinued        1,250 mg 166.7 mL/hr over 90 Minutes Intravenous 2 times daily 08/26/21 1507 08/28/21 1348   08/26/21 1000  oseltamivir (TAMIFLU) capsule 30 mg  Status:  Discontinued       See Hyperspace for full Linked Orders Report.   30 mg Oral 2 times daily 08/25/21 1936 08/26/21 0723   08/26/21 1000  oseltamivir (TAMIFLU) capsule 75 mg  Status:  Discontinued       See Hyperspace for full Linked Orders Report.   75 mg Oral 2 times daily 08/26/21 0723 08/26/21 0724   08/26/21 1000  oseltamivir (TAMIFLU) capsule 75 mg        75 mg Oral 2 times daily 08/26/21 0724 08/31/21 0959   08/25/21 2100  vancomycin (VANCOREADY) IVPB 1750 mg/350 mL  Status:  Discontinued        1,750 mg 175 mL/hr over 120 Minutes Intravenous Every 24 hours 08/25/21 1942 08/26/21 1507   08/25/21 2000  metroNIDAZOLE (FLAGYL) IVPB 500 mg  Status:  Discontinued        500 mg 100 mL/hr  over 60 Minutes Intravenous Every 12 hours 08/25/21 1932 08/26/21 1443   08/25/21 2000  ceFEPIme (MAXIPIME) 2 g in sodium chloride 0.9 % 100 mL IVPB  Status:  Discontinued        2 g 200 mL/hr over 30 Minutes Intravenous Every 8 hours 08/25/21 1942 08/26/21 1443   08/25/21 1945  oseltamivir (TAMIFLU) capsule 75 mg  Status:  Discontinued       See Hyperspace for full Linked Orders Report.   75 mg Oral  Once 08/25/21 1936 08/26/21 0724   08/25/21 1545  cefTRIAXone (ROCEPHIN) 2 g in sodium chloride 0.9 % 100 mL IVPB  Status:  Discontinued        2 g 200 mL/hr over 30 Minutes Intravenous Every 24 hours 08/25/21 1535 08/25/21 1932     Subjective:  Today, patient was seen and examined at bedside.  Patient is alert awake and communicative but feels frustrated about his life.  States that his pain is under control.  No nausea vomiting fever chills.  Objective: Vitals:   08/27/21 1931  08/27/21 2000 08/27/21 2337 08/28/21 0554  BP: 122/64 (!) 98/53 (!) 148/81 133/78  Pulse: 78 77 83 82  Resp: 18 (!) 27 (!) 24 15  Temp: 98.5 F (36.9 C)  99.8 F (37.7 C) 98.8 F (37.1 C)  TempSrc: Oral  Oral Oral  SpO2: 94% 94% 97% 93%  Weight:      Height:        Physical examination: General:  Average built, not in obvious distress, communicative but minimal, appears frustrated at times. HENT:   No scleral pallor or icterus noted. Oral mucosa is moist.  Chest:  Clear breath sounds.  Diminished breath sounds bilaterally. No crackles or wheezes.  CVS: S1 &S2 heard. No murmur.  Regular rate and rhythm. Abdomen: Soft, nontender, nondistended.  Bowel sounds are heard.   Extremities: Right great toe amputation, second right toe discoloration, status post left great toe amputation, erythema edema over the lower leg improving  psych: Alert, awake and oriented, normal mood CNS:  No cranial nerve deficits.  Power equal in all extremities.   Skin: Warm and dry.  Left lower extremity erythema edema cellulitis.   Bilateral lower extremity with dressing.   I have personally reviewed the following labs and imaging studies.  CBC: Recent Labs  Lab 08/25/21 1520 08/26/21 0532 08/28/21 0909 08/29/21 0416 08/30/21 0427 08/31/21 0901 09/01/21 0413  WBC 21.6*   < > 17.8* 18.1* 15.4* 14.0* 13.2*  NEUTROABS 19.5*  --   --   --   --   --   --   HGB 13.9   < > 11.1* 10.4* 10.8* 11.0* 11.0*  HCT 41.5   < > 34.6* 32.2* 33.5* 33.3* 33.1*  MCV 87.9   < > 91.3 90.2 90.3 89.3 88.5  PLT 281   < > 342 370 381 410* 418*   < > = values in this interval not displayed.    BMP &GFR Recent Labs  Lab 08/26/21 0532 08/27/21 0307 08/28/21 0444 08/28/21 0909 08/29/21 0416 08/30/21 0427 08/31/21 0901 09/01/21 0413  NA 136 139  --  140 140 138 140 139  K 3.1* 3.7  --  3.7 3.6 3.4* 3.4* 3.5  CL 101 109  --  111 109 107 105 102  CO2 25 23  --  _0 GLUCOSE 165* 110*  --  176* 171* 198* 139* 112*  BUN 25* 26*  --  _1 CREATININE 1.02 1.08   1.11   < > 0.97 1.06 0.91 0.90 0.90  CALCIUM 7.6* 7.5*  --  7.6* 7.8* 7.6* 7.5* 7.6*  MG 1.7 2.1  --  2.2 2.0 1.9  --   --   PHOS  --  2.6  --   --   --   --   --   --    < > = values in this interval not displayed.    Estimated Creatinine Clearance: 93.6 mL/min (by C-G formula based on SCr of 0.9 mg/dL). Liver & Pancreas: Recent Labs  Lab 08/25/21 1520 08/26/21 0532 08/27/21 0307 08/29/21 0416  AST 50* 40  --  33  ALT 32 25  --  21  ALKPHOS 78 60  --  83  BILITOT 1.2 0.9  --  0.7  PROT 7.6 5.5*  --  5.6*  ALBUMIN 3.0* 2.1* 2.1* 1.8*    No results for input(s): LIPASE, AMYLASE in the last 168 hours. No results for input(s): AMMONIA in the last  168 hours. Diabetic: No results for input(s): HGBA1C in the last 72 hours. Recent Labs  Lab 08/31/21 0803 08/31/21 1137 08/31/21 1628 08/31/21 2118 09/01/21 0723  GLUCAP 127* 125* 115* 118* 111*    Cardiac Enzymes: Recent Labs  Lab 08/27/21 0307  CKTOTAL 245    No results for  input(s): PROBNP in the last 8760 hours. Coagulation Profile: Recent Labs  Lab 08/25/21 1520 08/26/21 0532  INR 1.1 1.3*    Thyroid Function Tests: No results for input(s): TSH, T4TOTAL, FREET4, T3FREE, THYROIDAB in the last 72 hours. Lipid Profile: No results for input(s): CHOL, HDL, LDLCALC, TRIG, CHOLHDL, LDLDIRECT in the last 72 hours. Anemia Panel: No results for input(s): VITAMINB12, FOLATE, FERRITIN, TIBC, IRON, RETICCTPCT in the last 72 hours. Urine analysis:    Component Value Date/Time   COLORURINE AMBER (A) 08/25/2021 1833   APPEARANCEUR HAZY (A) 08/25/2021 1833   LABSPEC 1.024 08/25/2021 1833   PHURINE 5.0 08/25/2021 1833   GLUCOSEU >=500 (A) 08/25/2021 1833   HGBUR MODERATE (A) 08/25/2021 1833   BILIRUBINUR NEGATIVE 08/25/2021 1833   KETONESUR 5 (A) 08/25/2021 1833   PROTEINUR >=300 (A) 08/25/2021 1833   UROBILINOGEN 1.0 04/20/2010 1105   NITRITE NEGATIVE 08/25/2021 1833   LEUKOCYTESUR NEGATIVE 08/25/2021 1833   Sepsis Labs: Invalid input(s): PROCALCITONIN, [place]  Microbiology: Recent Results (from the past 240 hour(s))  Blood Culture (routine x 2)     Status: None   Collection Time: 08/25/21  3:20 PM   Specimen: BLOOD  Result Value Ref Range Status   Specimen Description   Final    BLOOD RIGHT ANTECUBITAL Performed at [facility] [address] [place], [place] [address]    Special Requests   Final    BOTTLES DRAWN AEROBIC AND ANAEROBIC Blood Culture results may not be optimal due to an excessive volume of blood received in culture bottles Performed at [facility] [address], [place], [place] [address]    Culture   Final    NO GROWTH 5 DAYS Performed at [facility], [place] [address] [place], [place] [address]    Report Status 08/30/2021 FINAL  Final  Blood Culture (routine x 2)     Status: None   Collection Time: 08/25/21  3:20 PM   Specimen: BLOOD  Result Value Ref Range Status    Specimen Description   Final    BLOOD LEFT ANTECUBITAL Performed at [facility] [address], [place], [place] [address]    Special Requests   Final    BOTTLES DRAWN AEROBIC AND ANAEROBIC Blood Culture results may not be optimal due to an excessive volume of blood received in culture bottles Performed at [facility] [address], [place], [place] [address]    Culture   Final    NO GROWTH 5 DAYS Performed at [facility], [place] [address], [place], [place] [address]    Report Status 08/30/2021 FINAL  Final  Resp Panel by RT-PCR (Flu A&B, Covid) Nasopharyngeal Swab     Status: Abnormal   Collection Time: 08/25/21  3:30 PM   Specimen: Nasopharyngeal Swab; Nasopharyngeal(NP) swabs in vial transport medium  Result Value Ref Range Status   SARS Coronavirus 2 by RT PCR NEGATIVE NEGATIVE Final    Comment: (NOTE) SARS-CoV-2 target nucleic acids are NOT DETECTED.  The SARS-CoV-2 RNA is generally detectable in upper respiratory specimens during the acute phase of infection. The lowest concentration of SARS-CoV-2 viral copies  this assay can detect is 138 copies/mL. A negative result does not preclude SARS-Cov-2 infection and should not be used as the sole basis for treatment or other patient management decisions. A negative result may occur with  improper specimen collection/handling, submission of specimen other than nasopharyngeal swab, presence of viral mutation(s) within the areas targeted by this assay, and inadequate number of viral copies(<138 copies/mL). A negative result must be combined with clinical observations, patient history, and epidemiological information. The expected result is Negative.  Fact Sheet for Patients:  EntrepreneurPulse.com.au  Fact Sheet for Healthcare Providers:  IncredibleEmployment.be  This test is no t yet approved or cleared by the [place] FDA and  has been  authorized for detection and/or diagnosis of SARS-CoV-2 by FDA under an Emergency Use Authorization (EUA). This EUA will remain  in effect (meaning this test can be used) for the duration of the COVID-19 declaration under Section 564(b)(1) of the Act, 21 U.S.C.section 360bbb-3(b)(1), unless the authorization is terminated  or revoked sooner.       Influenza A by PCR POSITIVE (A) NEGATIVE Final   Influenza B by PCR NEGATIVE NEGATIVE Final    Comment: (NOTE) The Xpert Xpress SARS-CoV-2/FLU/RSV plus assay is intended as an aid in the diagnosis of influenza from Nasopharyngeal swab specimens and should not be used as a sole basis for treatment. Nasal washings and aspirates are unacceptable for Xpert Xpress SARS-CoV-2/FLU/RSV testing.  Fact Sheet for Patients: EntrepreneurPulse.com.au  Fact Sheet for Healthcare Providers: IncredibleEmployment.be  This test is not yet approved or cleared by the [place] FDA and has been authorized for detection and/or diagnosis of SARS-CoV-2 by FDA under an Emergency Use Authorization (EUA). This EUA will remain in effect (meaning this test can be used) for the duration of the COVID-19 declaration under Section 564(b)(1) of the Act, 21 U.S.C. section 360bbb-3(b)(1), unless the authorization is terminated or revoked.  Performed at [facility], [place] [address], [place], [place] [address]   Urine Culture     Status: None   Collection Time: 08/25/21  6:33 PM   Specimen: In/Out Cath Urine  Result Value Ref Range Status   Specimen Description   Final    IN/OUT CATH URINE Performed at [facility] [address] [place], [place] [address]    Special Requests   Final    NONE Performed at [facility], [place] [address], [place], [place] [address]    Culture   Final    NO GROWTH Performed at [facility], [place] [address] [place], [place] [address]     Report Status 08/26/2021 FINAL  Final  MRSA Next Gen by PCR, Nasal     Status: None   Collection Time: 08/25/21 11:20 PM   Specimen: Nasal Mucosa; Nasal Swab  Result Value Ref Range Status   MRSA by PCR Next Gen NOT DETECTED NOT DETECTED Final    Comment: (NOTE) The GeneXpert MRSA Assay (FDA approved for NASAL specimens only), is one component of a comprehensive MRSA colonization surveillance program. It is not intended to diagnose MRSA infection nor to guide or monitor treatment for MRSA infections. Test performance is not FDA approved in patients less than 82 years old. Performed at [facility], [place] [address], [place], [place] [address]     Radiology Studies: No results found.  Oscar La, MD [facility] If 7PM-7AM, please contact night-coverage [email or web address] 09/01/2021, 9:50 AM

## 2021-09-02 ENCOUNTER — Other Ambulatory Visit (HOSPITAL_COMMUNITY): Payer: Self-pay

## 2021-09-02 DIAGNOSIS — E876 Hypokalemia: Secondary | ICD-10-CM

## 2021-09-02 DIAGNOSIS — J101 Influenza due to other identified influenza virus with other respiratory manifestations: Secondary | ICD-10-CM

## 2021-09-02 DIAGNOSIS — N179 Acute kidney failure, unspecified: Secondary | ICD-10-CM

## 2021-09-02 LAB — CBC
HCT: 32.2 % — ABNORMAL LOW (ref 39.0–52.0)
Hemoglobin: 10.5 g/dL — ABNORMAL LOW (ref 13.0–17.0)
MCH: 29.2 pg (ref 26.0–34.0)
MCHC: 32.6 g/dL (ref 30.0–36.0)
MCV: 89.7 fL (ref 80.0–100.0)
Platelets: 477 10*3/uL — ABNORMAL HIGH (ref 150–400)
RBC: 3.59 MIL/uL — ABNORMAL LOW (ref 4.22–5.81)
RDW: 13.4 % (ref 11.5–15.5)
WBC: 11.9 10*3/uL — ABNORMAL HIGH (ref 4.0–10.5)
nRBC: 0 % (ref 0.0–0.2)

## 2021-09-02 LAB — GLUCOSE, CAPILLARY
Glucose-Capillary: 104 mg/dL — ABNORMAL HIGH (ref 70–99)
Glucose-Capillary: 132 mg/dL — ABNORMAL HIGH (ref 70–99)
Glucose-Capillary: 113 mg/dL — ABNORMAL HIGH (ref 70–99)
Glucose-Capillary: 108 mg/dL — ABNORMAL HIGH (ref 70–99)

## 2021-09-02 LAB — BASIC METABOLIC PANEL
Anion gap: 6 (ref 5–15)
BUN: 16 mg/dL (ref 8–23)
CO2: 30 mmol/L (ref 22–32)
Calcium: 7.4 mg/dL — ABNORMAL LOW (ref 8.9–10.3)
Chloride: 101 mmol/L (ref 98–111)
Creatinine, Ser: 1.03 mg/dL (ref 0.61–1.24)
GFR, Estimated: >60 mL/min (ref >60)
Glucose, Bld: 106 mg/dL — ABNORMAL HIGH (ref 70–99)
Potassium: 3.3 mmol/L — ABNORMAL LOW (ref 3.5–5.1)
Sodium: 137 mmol/L (ref 135–145)

## 2021-09-02 MED ORDER — POTASSIUM CHLORIDE CRYS ER 20 MEQ PO TBCR
40.0000 meq | EXTENDED_RELEASE_TABLET | Freq: Once | ORAL | Status: AC
  Administered 2021-09-02: 40 meq via ORAL
  Filled 2021-09-02: qty 2

## 2021-09-02 MED ORDER — ENSURE ENLIVE PO LIQD
237.0000 mL | Freq: Every day | ORAL | 0 refills | Status: AC | PRN
  Filled 2021-09-02: qty 237, 1d supply, fill #0

## 2021-09-02 MED ORDER — CERTAVITE/ANTIOXIDANTS PO TABS
1.0000 | ORAL_TABLET | Freq: Every day | ORAL | 0 refills | Status: AC
  Filled 2021-09-02: qty 30, 30d supply, fill #0

## 2021-09-02 MED ORDER — AMOXICILLIN-POT CLAVULANATE 875-125 MG PO TABS
1.0000 | ORAL_TABLET | Freq: Two times a day (BID) | ORAL | 0 refills | Status: AC
  Filled 2021-09-02: qty 6, 3d supply, fill #0

## 2021-09-02 NOTE — Plan of Care (Signed)
°  Problem: Health Behavior/Discharge Planning: Goal: Ability to manage health-related needs will improve Outcome: Adequate for Discharge    PATIENT WILL BE DISCHARGE TO SNF.

## 2021-09-02 NOTE — Progress Notes (Signed)
RN attempted to call [facility] for report, no answer and unable to leave messages. Will attempt later.

## 2021-09-02 NOTE — Care Management Important Message (Signed)
Important Message  Patient Details IM Letter placed in Patients room. Name: Bryan Farrell MRN: [id number] Date of Birth: 03-Aug-1951   Medicare Important Message Given:  Yes     Caren Macadam 09/02/2021, 11:23 AM

## 2021-09-02 NOTE — Discharge Summary (Addendum)
Physician Discharge Summary  Pearlie OysterDonald Lewis Szydlowski JYN:[id number] DOB: May 05, 1951 DOA: 08/25/2021  PCP: Eloisa NorthernAmin, Saad, MD  Admit date: 08/25/2021 Discharge date: 09/02/2021  Admitted From: SNF  Disposition:  SNF   Recommendations for Outpatient Follow-up and new medication changes:  Follow up with Dr. Jason FilaSaad in 7 to 10 days. Continue with Augmentin for 3 more days  Follow up with wound care.    Home Health: na   Equipment/Devices: na    Discharge Condition: stable  CODE STATUS: full  Diet recommendation:  heart healthy   Brief/Interim Summary: [name] was admitted to the hospital with the working diagnosis of left lower extremity cellulitis complicated with severe sepsis.   71 yo male with the past medical history of vascular dementia, T2DM, left toe amputation and depression who presented with left lower extremity erythema and edema. Patient not able to give detailed history due to cognitive impairment, per nursing home report, patient had worsening left lower extremity erythema and edema for about 24 hrs, prompting his transfer to the ED. On his initial physical examination his temperature was 102.6, RR 20, blood pressure 136/95, and oxygenation 97%, Lungs with no wheezing or rhonchi, heart with S1 and S2 present and rhythmic, abdomen soft, left lower extremity with tender edema and erythema, noted second toe prior amputation.   Na 137, K 3,3, CL 101, bicarb 27, glucose 223, bun 31 and cr 1,33 AST 50 and ALT 32 Wbc 21,6, hgb 13.4658m hct 41.5 and plt 281 Influenza A positive, COVID 19 negative   Urine analysis with specific gravity 1,024 with >500 glucose. 0-5 wbc   Chest radiograph with cardiomegaly with left base atelectasis.   EKG 101 bpm, normal axis with normal intervals, sinus rhythm, with no significant ST segment or T wave changes.   Patient was admitted to the step down unit, received broad spectrum antibiotic therapy and IV fluids with good toleration.  Ruled out for  osteomyelitis.   Received oselramivir for influenza.   Psychiatry was consulted because patient refusing some medical therapy. He was placed on thiamine with good toleration.  Patient will continue oral antibiotic therapy at SNF.   Left lower extremity cellulitis, complicated with severe sepsis, (present on admission, and organ failure AKI and elevated liver enzymes).  Patient responded well to medical therapy with IV fluids and broad spectrum antibiotic therapy. His cultures remained with no growth and his leukocytosis improved, at discharge his wbc is 11.9 Orthopedics was consulted with recommendations to continue with antibiotic therapy and local wound care.  Antibiotic therapy was transitioned to oral Augmentin that will be continued for 3 more days.  His wound has dressing in place.   2. Influenza A infection. Patient tested positive for influenza A on screening on admission. He received Oseltamivir for 5 days with good toleration, at discharge he has been afebrile.   3. AKI, hypokalemia Patient received IV fluids and supportive medical therapy. His renal function has improved, at discharge his serum cr is 1,0 with K at 3,3 and serum bicarbonate at 30. Patient will get KCl before discharge Continue follow up renal function and electrolytes as outpatient Patient is tolerating po well.   4. T2DM with uncontrolled hyperglycemia, diabetic neuropathy/ dyslipidemia Patient was placed on insulin sliding scale for glucose cover and monitoring Continue with gabapentin.  Patient on atorvastatin.   5. Vascular dementia with depression. Adjustment disorder.  Patient refused medical interventions, palliative care and psychiatry was consulted.  Patient was placed on thiamine and continued on sertraline and  bupropion. Plan to follow up as outpatient.   Continue with depakote.   Discharge Diagnoses:  Principal Problem:   Sepsis due to cellulitis [facility]) Active Problems:   Vascular  dementia (HCC)   Diabetes (HCC)   AKI (acute kidney injury) (HCC)   Hypokalemia   Influenza A    Discharge Instructions   Allergies as of 09/02/2021   No Known Allergies      Medication List     STOP taking these medications    ertapenem 1 g in sodium chloride 0.9 % 100 mL   insulin aspart 100 UNIT/ML injection Commonly known as: novoLOG       TAKE these medications    acetaminophen 325 MG tablet Commonly known as: TYLENOL Take 650 mg by mouth daily. What changed: Another medication with the same name was removed. Continue taking this medication, and follow the directions you see here.   amoxicillin-clavulanate 875-125 MG tablet Commonly known as: AUGMENTIN Take 1 tablet by mouth every 12 (twelve) hours for 3 days.   atorvastatin 40 MG tablet Commonly known as: LIPITOR Take 40 mg by mouth daily.   buPROPion 75 MG tablet Commonly known as: WELLBUTRIN Take 75 mg by mouth 2 (two) times daily.   cholecalciferol 25 MCG (1000 UNIT) tablet Commonly known as: VITAMIN D Take 1,000 Units by mouth daily.   divalproex 125 MG DR tablet Commonly known as: DEPAKOTE Take 125 mg by mouth at bedtime.   feeding supplement Liqd Take 237 mLs by mouth daily as needed (If patient will accept).   gabapentin 100 MG capsule Commonly known as: Neurontin Take 1 capsule (100 mg total) by mouth 3 (three) times daily. What changed: when to take this   insulin glargine 100 UNIT/ML injection Commonly known as: LANTUS Inject 0.05 mLs (5 Units total) into the skin at bedtime. What changed: how much to take   lidocaine (PF) 1 % Soln injection Commonly known as: XYLOCAINE 3.2 mLs by Other route daily. Along with Invanz   multivitamin with minerals Tabs tablet Take 1 tablet by mouth daily. Start taking on: September 03, 2021   sertraline 100 MG tablet Commonly known as: ZOLOFT Take 200 mg by mouth daily.   Trulicity 1.5 MG/0.5ML Sopn Generic drug: Dulaglutide Inject 1.5 mg  into the skin once a week.               Discharge Care Instructions  (From admission, onward)           Start     Ordered   09/02/21 0000  Discharge wound care:       Comments: Wound care to bilateral feet: cleanse with soap and water, particularly between digits and dry thoroughly but gently. Cover right affected toe and wound between great toe and 3rd digit on left foot with single layer of xeroform gauze [place] [phone]), top with folded gauze and secure with a few turns of Kerlix roil gauze/ paper tape. Place feet into [employer].   09/02/21 1303            No Known Allergies  Consultations: Wound care Palliative Care Psychiatry    Procedures/Studies: DG Tibia/Fibula Left  Result Date: 08/25/2021 CLINICAL DATA:  Cellulitis, leg swelling EXAM: LEFT TIBIA AND FIBULA - 2 VIEW COMPARISON:  None. FINDINGS: Old healed distal tibial shaft fracture. No acute fracture, subluxation or dislocation. No bone destruction to suggest osteomyelitis. Soft tissue swelling noted without soft tissue gas or radiopaque foreign body. IMPRESSION: No acute bony abnormality. Electronically  Signed   By: Charlett Nose M.D.   On: 08/25/2021 21:05   DG Chest Port 1 View  Result Date: 08/25/2021 CLINICAL DATA:  Questionable sepsis - evaluate for abnormality Left leg cellulitis. EXAM: PORTABLE CHEST 1 VIEW COMPARISON:  03/14/2019 FINDINGS: Lung volumes are low. Borderline mild cardiomegaly. The cardiomediastinal contours are normal. Streaky atelectasis at the left lung base. Pulmonary vasculature is normal. No consolidation, pleural effusion, or pneumothorax. No acute osseous abnormalities are seen. IMPRESSION: 1. Low lung volumes with left basilar atelectasis. 2. Borderline mild cardiomegaly without evidence of pulmonary edema. Electronically Signed   By: Narda Rutherford M.D.   On: 08/25/2021 16:24   DG Foot 2 Views Left  Result Date: 08/26/2021 CLINICAL DATA:  Limited views due to patient  condition. Redness up left leg with swelling and edema. EXAM: LEFT FOOT - 2 VIEW COMPARISON:  X-ray left foot 05/27/2019. FINDINGS: Transmetatarsal amputation of the second digit. No cortical erosion or destruction. There is no evidence of fracture or dislocation. There is no evidence of arthropathy or other focal bone abnormality. Severe soft tissue edema. Vascular calcifications. IMPRESSION: 1. Transmetatarsal amputation of the second digit. 2. No radiographic findings to suggest osteomyelitis. 3.  No acute displaced fracture or dislocation. Electronically Signed   By: Tish Frederickson M.D.   On: 08/26/2021 17:16   DG Foot 2 Views Right  Result Date: 08/26/2021 CLINICAL DATA:  Limited views due to patient condition. Redness up left leg with swelling and edema. EXAM: RIGHT FOOT - 2 VIEW COMPARISON:  X-ray right foot 09/17/2017 FINDINGS: Limited evaluation due to overlapping osseous structures and overlying soft tissues. Transmetatarsal amputation of the first digit. No cortical erosion or destruction. There is no evidence of fracture or dislocation. There is no evidence of arthropathy or other focal bone abnormality. Subcutaneus soft tissue edema and emphysema along the first digit. Vascular calcifications. IMPRESSION: 1. Transmetatarsal amputation of the first digit. 2. Subcutaneus soft tissue edema and emphysema along the first digit. No definite radiographic finding of underlying osteomyelitis. Limited evaluation due to overlapping osseous structures and overlying soft tissues. 3.  No acute displaced fracture or dislocation. Electronically Signed   By: Tish Frederickson M.D.   On: 08/26/2021 17:18   VAS [place] LOWER EXTREMITY VENOUS (DVT)  Result Date: 08/26/2021  Lower Venous DVT Study Patient Name:  RYLYNN SCHONEMAN  Date of Exam:   08/26/2021 Medical Rec #: [id number]           Accession #:    [id number] Date of Birth: 1951/01/23          Patient Gender: M Patient Age:   83 years Exam Location:  [facility] Procedure:      VAS [place] LOWER EXTREMITY VENOUS (DVT) Referring Phys: Lyn Hollingshead MELVIN --------------------------------------------------------------------------------  Indications: Edema.  Risk Factors: None identified. Limitations: Poor ultrasound/tissue interface, open wound and patient pain tolerance, patient positioning, poor patient cooperation. Comparison Study: No prior studies. Performing Technologist: Chanda Busing RVT  Examination Guidelines: A complete evaluation includes B-mode imaging, spectral Doppler, color Doppler, and power Doppler as needed of all accessible portions of each vessel. Bilateral testing is considered an integral part of a complete examination. Limited examinations for reoccurring indications may be performed as noted. The reflux portion of the exam is performed with the patient in reverse Trendelenburg.  +-----+---------------+---------+-----------+----------+--------------+  RIGHT Compressibility Phasicity Spontaneity Properties Thrombus Aging  +-----+---------------+---------+-----------+----------+--------------+  CFV   Full            Yes  Yes                                    +-----+---------------+---------+-----------+----------+--------------+   +---------+---------------+---------+-----------+----------+-------------------+  LEFT      Compressibility Phasicity Spontaneity Properties Thrombus Aging       +---------+---------------+---------+-----------+----------+-------------------+  CFV       Full            Yes       Yes                                         +---------+---------------+---------+-----------+----------+-------------------+  SFJ       Full                                                                  +---------+---------------+---------+-----------+----------+-------------------+  FV Prox   Full                                                                  +---------+---------------+---------+-----------+----------+-------------------+   FV Mid    Full                                                                  +---------+---------------+---------+-----------+----------+-------------------+  FV Distal Full                                                                  +---------+---------------+---------+-----------+----------+-------------------+  PFV       Full                                                                  +---------+---------------+---------+-----------+----------+-------------------+  POP       Full            Yes       Yes                                         +---------+---------------+---------+-----------+----------+-------------------+  PTV  Patency shown with                                                               color doppler        +---------+---------------+---------+-----------+----------+-------------------+  PERO                                                       Patency shown with                                                               color doppler        +---------+---------------+---------+-----------+----------+-------------------+     Summary: RIGHT: - No evidence of common femoral vein obstruction.  LEFT: - There is no evidence of deep vein thrombosis in the lower extremity. However, portions of this examination were limited- see technologist comments above.  - No cystic structure found in the popliteal fossa.  *See table(s) above for measurements and observations. Electronically signed by Heath Larkhomas Hawken on 08/26/2021 at 2:48:12 PM.    Final       Subjective: Patient with no nausea or vomiting, he denies any chest pain or dyspnea, no left lower extremity pain.   Discharge Exam: Vitals:   09/01/21 1925 09/02/21 0412  BP: (!) 155/74 (!) 158/77  Pulse: 62 66  Resp: 20 20  Temp: 98.7 F (37.1 C) 98.5 F (36.9 C)  SpO2: 92% 93%   Vitals:   09/01/21 1136 09/01/21 1925 09/02/21 0412 09/02/21 0428  BP: (!) 148/81 (!)  155/74 (!) 158/77   Pulse: 78 62 66   Resp: 19 20 20    Temp: 98.4 F (36.9 C) 98.7 F (37.1 C) 98.5 F (36.9 C)   TempSrc: Oral Axillary Axillary   SpO2: 93% 92% 93%   Weight:    94.9 kg  Height:        General: Not in pain or dyspnea  Neurology: Awake and alert, non focal  E ENT: no pallor, no icterus, oral mucosa moist Cardiovascular: No JVD. S1-S2 present, rhythmic, no gallops, rubs, or murmurs. No lower extremity edema. Pulmonary: positive breath sounds bilaterally, adequate air movement, no wheezing, rhonchi or rales. Gastrointestinal. Abdomen soft and non tender Skin. Left leg with dressing in place. Bilateral pressure protection boots in place.  Musculoskeletal: no joint deformities   The results of significant diagnostics from this hospitalization (including imaging, microbiology, ancillary and laboratory) are listed below for reference.     Microbiology: Recent Results (from the past 240 hour(s))  Blood Culture (routine x 2)     Status: None   Collection Time: 08/25/21  3:20 PM   Specimen: BLOOD  Result Value Ref Range Status   Specimen Description   Final    BLOOD RIGHT ANTECUBITAL Performed at [facility], [address] [place], [place] [id number]    Special Requests   Final    BOTTLES DRAWN AEROBIC AND ANAEROBIC  Blood Culture results may not be optimal due to an excessive volume of blood received in culture bottles Performed at [facility], [address], [place], [place] [address]    Culture   Final    NO GROWTH 5 DAYS Performed at [facility], [address], [place], [place] [address]    Report Status 08/30/2021 FINAL  Final  Blood Culture (routine x 2)     Status: None   Collection Time: 08/25/21  3:20 PM   Specimen: BLOOD  Result Value Ref Range Status   Specimen Description   Final    BLOOD LEFT ANTECUBITAL Performed at [facility], [address] [place], [place] [address]     Special Requests   Final    BOTTLES DRAWN AEROBIC AND ANAEROBIC Blood Culture results may not be optimal due to an excessive volume of blood received in culture bottles Performed at [facility], [address] [place], [place] [address]    Culture   Final    NO GROWTH 5 DAYS Performed at [facility], [address] [place], [place] [address]    Report Status 08/30/2021 FINAL  Final  Resp Panel by RT-PCR (Flu A&B, Covid) Nasopharyngeal Swab     Status: Abnormal   Collection Time: 08/25/21  3:30 PM   Specimen: Nasopharyngeal Swab; Nasopharyngeal(NP) swabs in vial transport medium  Result Value Ref Range Status   SARS Coronavirus 2 by RT PCR NEGATIVE NEGATIVE Final    Comment: (NOTE) SARS-CoV-2 target nucleic acids are NOT DETECTED.  The SARS-CoV-2 RNA is generally detectable in upper respiratory specimens during the acute phase of infection. The lowest concentration of SARS-CoV-2 viral copies this assay can detect is 138 copies/mL. A negative result does not preclude SARS-Cov-2 infection and should not be used as the sole basis for treatment or other patient management decisions. A negative result may occur with  improper specimen collection/handling, submission of specimen other than nasopharyngeal swab, presence of viral mutation(s) within the areas targeted by this assay, and inadequate number of viral copies(<138 copies/mL). A negative result must be combined with clinical observations, patient history, and epidemiological information. The expected result is Negative.  Fact Sheet for Patients:  BloggerCourse.com  Fact Sheet for Healthcare Providers:  SeriousBroker.it  This test is no t yet approved or cleared by the [place] FDA and  has been authorized for detection and/or diagnosis of SARS-CoV-2 by FDA under an Emergency Use Authorization (EUA). This EUA will remain  in effect (meaning this  test can be used) for the duration of the COVID-19 declaration under Section 564(b)(1) of the Act, 21 U.S.C.section 360bbb-3(b)(1), unless the authorization is terminated  or revoked sooner.       Influenza A by PCR POSITIVE (A) NEGATIVE Final   Influenza B by PCR NEGATIVE NEGATIVE Final    Comment: (NOTE) The Xpert Xpress SARS-CoV-2/FLU/RSV plus assay is intended as an aid in the diagnosis of influenza from Nasopharyngeal swab specimens and should not be used as a sole basis for treatment. Nasal washings and aspirates are unacceptable for Xpert Xpress SARS-CoV-2/FLU/RSV testing.  Fact Sheet for Patients: BloggerCourse.com  Fact Sheet for Healthcare Providers: SeriousBroker.it  This test is not yet approved or cleared by the [place] FDA and has been authorized for detection and/or diagnosis of SARS-CoV-2 by FDA under an Emergency Use Authorization (EUA). This EUA will remain in effect (meaning this test can be used) for the duration of the  COVID-19 declaration under Section 564(b)(1) of the Act, 21 U.S.C. section 360bbb-3(b)(1), unless the authorization is terminated or revoked.  Performed at [facility], [address], [place], [place] [address]   Urine Culture     Status: None   Collection Time: 08/25/21  6:33 PM   Specimen: In/Out Cath Urine  Result Value Ref Range Status   Specimen Description   Final    IN/OUT CATH URINE Performed at [facility], [address], [place], [place] [address]    Special Requests   Final    NONE Performed at [facility], [address] [place], [place] [address]    Culture   Final    NO GROWTH Performed at [facility], [address], [place], [place] [address]    Report Status 08/26/2021 FINAL  Final  MRSA Next Gen by PCR, Nasal     Status: None   Collection Time: 08/25/21 11:20 PM   Specimen: Nasal Mucosa;  Nasal Swab  Result Value Ref Range Status   MRSA by PCR Next Gen NOT DETECTED NOT DETECTED Final    Comment: (NOTE) The GeneXpert MRSA Assay (FDA approved for NASAL specimens only), is one component of a comprehensive MRSA colonization surveillance program. It is not intended to diagnose MRSA infection nor to guide or monitor treatment for MRSA infections. Test performance is not FDA approved in patients less than 95 years old. Performed at [facility], [address], [place], [place] [address]   Resp Panel by RT-PCR (Flu A&B, Covid) Nasopharyngeal Swab     Status: Abnormal   Collection Time: 09/01/21 12:26 PM   Specimen: Nasopharyngeal Swab; Nasopharyngeal(NP) swabs in vial transport medium  Result Value Ref Range Status   SARS Coronavirus 2 by RT PCR NEGATIVE NEGATIVE Final    Comment: (NOTE) SARS-CoV-2 target nucleic acids are NOT DETECTED.  The SARS-CoV-2 RNA is generally detectable in upper respiratory specimens during the acute phase of infection. The lowest concentration of SARS-CoV-2 viral copies this assay can detect is 138 copies/mL. A negative result does not preclude SARS-Cov-2 infection and should not be used as the sole basis for treatment or other patient management decisions. A negative result may occur with  improper specimen collection/handling, submission of specimen other than nasopharyngeal swab, presence of viral mutation(s) within the areas targeted by this assay, and inadequate number of viral copies(<138 copies/mL). A negative result must be combined with clinical observations, patient history, and epidemiological information. The expected result is Negative.  Fact Sheet for Patients:  BloggerCourse.com  Fact Sheet for Healthcare Providers:  SeriousBroker.it  This test is no t yet approved or cleared by the [place] FDA and  has been authorized for detection and/or diagnosis of  SARS-CoV-2 by FDA under an Emergency Use Authorization (EUA). This EUA will remain  in effect (meaning this test can be used) for the duration of the COVID-19 declaration under Section 564(b)(1) of the Act, 21 U.S.C.section 360bbb-3(b)(1), unless the authorization is terminated  or revoked sooner.       Influenza A by PCR POSITIVE (A) NEGATIVE Final   Influenza B by PCR NEGATIVE NEGATIVE Final    Comment: (NOTE) The Xpert Xpress SARS-CoV-2/FLU/RSV plus assay is intended as an aid in the diagnosis of influenza from Nasopharyngeal swab specimens and should not be used as a sole basis for treatment. Nasal washings and aspirates are unacceptable for Xpert Xpress SARS-CoV-2/FLU/RSV testing.  Fact Sheet for Patients: BloggerCourse.com  Fact Sheet for Healthcare Providers: SeriousBroker.it  This test is not yet approved or cleared by the [place] FDA and has been authorized for detection and/or diagnosis of SARS-CoV-2 by FDA under an Emergency Use Authorization (EUA). This EUA will remain in effect (meaning this test can be used) for the duration of the COVID-19 declaration under Section 564(b)(1) of the Act, 21 U.S.C. section 360bbb-3(b)(1), unless the authorization is terminated or revoked.  Performed at [facility], [address], [place], [place] [address]      Labs: BNP (last 3 results) No results for input(s): BNP in the last 8760 hours. Basic Metabolic Panel: Recent Labs  Lab 08/27/21 0307 08/28/21 0444 08/28/21 0909 08/29/21 0416 08/30/21 0427 08/31/21 0901 09/01/21 0413 09/02/21 0422  NA 139  --  140 140 138 140 139 137  K 3.7  --  3.7 3.6 3.4* 3.4* 3.5 3.3*  CL 109  --  111 109 107 105 102 101  CO2 23  --  23 24 26 28 29 30   GLUCOSE 110*  --  176* 171* 198* 139* 112* 106*  BUN 26*  --  23 20 18 15 14 16   CREATININE 1.08   1.11   < > 0.97 1.06 0.91 0.90 0.90 1.03  CALCIUM 7.5*  --   7.6* 7.8* 7.6* 7.5* 7.6* 7.4*  MG 2.1  --  2.2 2.0 1.9  --   --   --   PHOS 2.6  --   --   --   --   --   --   --    < > = values in this interval not displayed.   Liver Function Tests: Recent Labs  Lab 08/27/21 0307 08/29/21 0416  AST  --  33  ALT  --  21  ALKPHOS  --  83  BILITOT  --  0.7  PROT  --  5.6*  ALBUMIN 2.1* 1.8*   No results for input(s): LIPASE, AMYLASE in the last 168 hours. No results for input(s): AMMONIA in the last 168 hours. CBC: Recent Labs  Lab 08/29/21 0416 08/30/21 0427 08/31/21 0901 09/01/21 0413 09/02/21 0422  WBC 18.1* 15.4* 14.0* 13.2* 11.9*  HGB 10.4* 10.8* 11.0* 11.0* 10.5*  HCT 32.2* 33.5* 33.3* 33.1* 32.2*  MCV 90.2 90.3 89.3 88.5 89.7  PLT 370 381 410* 418* 477*   Cardiac Enzymes: Recent Labs  Lab 08/27/21 0307  CKTOTAL 245   BNP: Invalid input(s): POCBNP CBG: Recent Labs  Lab 09/01/21 0723 09/01/21 1139 09/01/21 1623 09/01/21 2107 09/02/21 0754  GLUCAP 111* 142* 144* 90 104*   D-Dimer No results for input(s): DDIMER in the last 72 hours. Hgb A1c No results for input(s): HGBA1C in the last 72 hours. Lipid Profile No results for input(s): CHOL, HDL, LDLCALC, TRIG, CHOLHDL, LDLDIRECT in the last 72 hours. Thyroid function studies No results for input(s): TSH, T4TOTAL, T3FREE, THYROIDAB in the last 72 hours.  Invalid input(s): FREET3 Anemia work up No results for input(s): VITAMINB12, FOLATE, FERRITIN, TIBC, IRON, RETICCTPCT in the last 72 hours. Urinalysis    Component Value Date/Time   COLORURINE AMBER (A) 08/25/2021 1833   APPEARANCEUR HAZY (A) 08/25/2021 1833   LABSPEC 1.024 08/25/2021 1833   PHURINE 5.0 08/25/2021 1833   GLUCOSEU >=500 (A) 08/25/2021 1833   HGBUR MODERATE (A) 08/25/2021 1833   BILIRUBINUR NEGATIVE 08/25/2021 1833   KETONESUR 5 (A) 08/25/2021 1833   PROTEINUR >=300 (A) 08/25/2021 1833   UROBILINOGEN 1.0 04/20/2010 1105  NITRITE NEGATIVE 08/25/2021 1833   LEUKOCYTESUR NEGATIVE 08/25/2021  1833   Sepsis Labs Invalid input(s): PROCALCITONIN,  WBC,  LACTICIDVEN Microbiology Recent Results (from the past 240 hour(s))  Blood Culture (routine x 2)     Status: None   Collection Time: 08/25/21  3:20 PM   Specimen: BLOOD  Result Value Ref Range Status   Specimen Description   Final    BLOOD RIGHT ANTECUBITAL Performed at [facility], [address], [place], [place] [address]    Special Requests   Final    BOTTLES DRAWN AEROBIC AND ANAEROBIC Blood Culture results may not be optimal due to an excessive volume of blood received in culture bottles Performed at [facility], [address] [place], [place] [address]    Culture   Final    NO GROWTH 5 DAYS Performed at [facility], [address] [place], [place] [address]    Report Status 08/30/2021 FINAL  Final  Blood Culture (routine x 2)     Status: None   Collection Time: 08/25/21  3:20 PM   Specimen: BLOOD  Result Value Ref Range Status   Specimen Description   Final    BLOOD LEFT ANTECUBITAL Performed at [facility], [address], [place], [place] [address]    Special Requests   Final    BOTTLES DRAWN AEROBIC AND ANAEROBIC Blood Culture results may not be optimal due to an excessive volume of blood received in culture bottles Performed at [facility] [address] [place], [place] [address]    Culture   Final    NO GROWTH 5 DAYS Performed at [facility], [address], [place], [place] [address]    Report Status 08/30/2021 FINAL  Final  Resp Panel by RT-PCR (Flu A&B, Covid) Nasopharyngeal Swab     Status: Abnormal   Collection Time: 08/25/21  3:30 PM   Specimen: Nasopharyngeal Swab; Nasopharyngeal(NP) swabs in vial transport medium  Result Value Ref Range Status   SARS Coronavirus 2 by RT PCR NEGATIVE NEGATIVE Final    Comment: (NOTE) SARS-CoV-2 target nucleic acids are NOT DETECTED.  The SARS-CoV-2 RNA is  generally detectable in upper respiratory specimens during the acute phase of infection. The lowest concentration of SARS-CoV-2 viral copies this assay can detect is 138 copies/mL. A negative result does not preclude SARS-Cov-2 infection and should not be used as the sole basis for treatment or other patient management decisions. A negative result may occur with  improper specimen collection/handling, submission of specimen other than nasopharyngeal swab, presence of viral mutation(s) within the areas targeted by this assay, and inadequate number of viral copies(<138 copies/mL). A negative result must be combined with clinical observations, patient history, and epidemiological information. The expected result is Negative.  Fact Sheet for Patients:  BloggerCourse.com  Fact Sheet for Healthcare Providers:  SeriousBroker.it  This test is no t yet approved or cleared by the [place] FDA and  has been authorized for detection and/or diagnosis of SARS-CoV-2 by FDA under an Emergency Use Authorization (EUA). This EUA will remain  in effect (meaning this test can be used) for the duration of the COVID-19 declaration under Section 564(b)(1) of the Act, 21 U.S.C.section 360bbb-3(b)(1), unless the authorization is terminated  or revoked sooner.       Influenza A by PCR POSITIVE (A) NEGATIVE Final   Influenza B by PCR NEGATIVE NEGATIVE Final    Comment: (NOTE) The Xpert Xpress  SARS-CoV-2/FLU/RSV plus assay is intended as an aid in the diagnosis of influenza from Nasopharyngeal swab specimens and should not be used as a sole basis for treatment. Nasal washings and aspirates are unacceptable for Xpert Xpress SARS-CoV-2/FLU/RSV testing.  Fact Sheet for Patients: BloggerCourse.com  Fact Sheet for Healthcare Providers: SeriousBroker.it  This test is not yet approved or cleared by the  [place] FDA and has been authorized for detection and/or diagnosis of SARS-CoV-2 by FDA under an Emergency Use Authorization (EUA). This EUA will remain in effect (meaning this test can be used) for the duration of the COVID-19 declaration under Section 564(b)(1) of the Act, 21 U.S.C. section 360bbb-3(b)(1), unless the authorization is terminated or revoked.  Performed at [facility], [address], [place], [place] [address]   Urine Culture     Status: None   Collection Time: 08/25/21  6:33 PM   Specimen: In/Out Cath Urine  Result Value Ref Range Status   Specimen Description   Final    IN/OUT CATH URINE Performed at [facility], [address] [place], [place] [address]    Special Requests   Final    NONE Performed at [facility], [address] [place], [place] [address]    Culture   Final    NO GROWTH Performed at [facility], [address], [place], [place] [address]    Report Status 08/26/2021 FINAL  Final  MRSA Next Gen by PCR, Nasal     Status: None   Collection Time: 08/25/21 11:20 PM   Specimen: Nasal Mucosa; Nasal Swab  Result Value Ref Range Status   MRSA by PCR Next Gen NOT DETECTED NOT DETECTED Final    Comment: (NOTE) The GeneXpert MRSA Assay (FDA approved for NASAL specimens only), is one component of a comprehensive MRSA colonization surveillance program. It is not intended to diagnose MRSA infection nor to guide or monitor treatment for MRSA infections. Test performance is not FDA approved in patients less than 95 years old. Performed at [facility], [address], [place], [place] [address]   Resp Panel by RT-PCR (Flu A&B, Covid) Nasopharyngeal Swab     Status: Abnormal   Collection Time: 09/01/21 12:26 PM   Specimen: Nasopharyngeal Swab; Nasopharyngeal(NP) swabs in vial transport medium  Result Value Ref Range Status   SARS Coronavirus 2 by RT PCR  NEGATIVE NEGATIVE Final    Comment: (NOTE) SARS-CoV-2 target nucleic acids are NOT DETECTED.  The SARS-CoV-2 RNA is generally detectable in upper respiratory specimens during the acute phase of infection. The lowest concentration of SARS-CoV-2 viral copies this assay can detect is 138 copies/mL. A negative result does not preclude SARS-Cov-2 infection and should not be used as the sole basis for treatment or other patient management decisions. A negative result may occur with  improper specimen collection/handling, submission of specimen other than nasopharyngeal swab, presence of viral mutation(s) within the areas targeted by this assay, and inadequate number of viral copies(<138 copies/mL). A negative result must be combined with clinical observations, patient history, and epidemiological information. The expected result is Negative.  Fact Sheet for Patients:  BloggerCourse.com  Fact Sheet for Healthcare Providers:  SeriousBroker.it  This test is no t yet approved or cleared by the [place] FDA and  has been authorized for detection and/or diagnosis of SARS-CoV-2 by FDA under an Emergency Use Authorization (EUA). This EUA will remain  in effect (meaning this test can be used) for  the duration of the COVID-19 declaration under Section 564(b)(1) of the Act, 21 U.S.C.section 360bbb-3(b)(1), unless the authorization is terminated  or revoked sooner.       Influenza A by PCR POSITIVE (A) NEGATIVE Final   Influenza B by PCR NEGATIVE NEGATIVE Final    Comment: (NOTE) The Xpert Xpress SARS-CoV-2/FLU/RSV plus assay is intended as an aid in the diagnosis of influenza from Nasopharyngeal swab specimens and should not be used as a sole basis for treatment. Nasal washings and aspirates are unacceptable for Xpert Xpress SARS-CoV-2/FLU/RSV testing.  Fact Sheet for Patients: BloggerCourse.com  Fact Sheet for  Healthcare Providers: SeriousBroker.it  This test is not yet approved or cleared by the [place] FDA and has been authorized for detection and/or diagnosis of SARS-CoV-2 by FDA under an Emergency Use Authorization (EUA). This EUA will remain in effect (meaning this test can be used) for the duration of the COVID-19 declaration under Section 564(b)(1) of the Act, 21 U.S.C. section 360bbb-3(b)(1), unless the authorization is terminated or revoked.  Performed at [facility], [address], [place], [place] [address]      Time coordinating discharge: 45 minutes  SIGNED:   Coralie Keens, MD  [facility] 09/02/2021, 11:54 AM

## 2021-09-02 NOTE — TOC Transition Note (Addendum)
Transition of Care [facility]) - CM/SW Discharge Note   Patient Details  Name: Bryan Farrell MRN: [id number] Date of Birth: 12/20/1950  Transition of [facility]) CM/SW Contact:  Lanier Clam, RN Phone Number: 09/02/2021, 10:43 AM   Clinical Narrative:  d/c back to [facility] L[facility] [place] following.[facility] to be called once rm# given.  -11:42a-going to rm#[id number],nsg call report tel#[phone]. Once d/c summary in place will call [facility]. No further CM needs. -1:19p-[facility] called.     Final next level of care: Long Term Nursing Home Barriers to Discharge: No Barriers Identified   Patient Goals and CMS Choice Patient states their goals for this hospitalization and ongoing recovery are:: To return back to SNF where he is a LTC resident. CMS Medicare.gov Compare Post Acute Care list provided to:: Patient Represenative (must comment) (unable to leave messages w/son or dtr.) Choice offered to / list presented to : Patient  Discharge Placement              Patient chooses bed at: [facility] Patient to be transferred to facility by: [facility] Name of family member notified: family not answering phone-unable to leave message Patient and family notified of of transfer: 09/02/21  Discharge Plan and Services     Post Acute Care Choice: Skilled Nursing Facility                               Social Determinants of Health (SDOH) Interventions     Readmission Risk Interventions Readmission Risk Prevention Plan 05/28/2019  Transportation Screening Complete  PCP or Specialist Appt within 3-5 Days Not Complete  Not Complete comments Continued medical work-up  HRI or Home Care Consult Complete  Social Work Consult for Recovery Care Planning/Counseling Complete  Palliative Care Screening Not Applicable  Medication Review Oceanographer) Complete  Some recent data might be hidden

## 2021-09-02 NOTE — Progress Notes (Signed)
Report given to Rocky Link at [facility]. Pt to be admitted to room 223 pending transport from [facility].

## 2021-09-02 NOTE — Progress Notes (Signed)
[facility] arrived to transport patient to [facility]. VSS. All pt belongings present. Pt denies any questions at this time.

## 2021-11-27 ENCOUNTER — Emergency Department (HOSPITAL_COMMUNITY)
Admission: EM | Admit: 2021-11-27 | Discharge: 2021-11-27 | Disposition: A | Discharge status: Home / Self Care | Payer: Medicare Other | Attending: Emergency Medicine | Admitting: Emergency Medicine

## 2021-11-27 ENCOUNTER — Encounter (HOSPITAL_COMMUNITY): Payer: Self-pay | Admitting: Emergency Medicine

## 2021-11-27 DIAGNOSIS — Z452 Encounter for adjustment and management of vascular access device: Secondary | ICD-10-CM | POA: Diagnosis not present

## 2021-11-27 MED ORDER — HEPARIN SOD (PORK) LOCK FLUSH 100 UNIT/ML IV SOLN: 500.0000 [IU] | Freq: Once | INTRAVENOUS | Status: DC

## 2021-11-27 NOTE — Discharge Instructions (Addendum)
?  Please use the peripheral IV until then ?

## 2021-11-27 NOTE — ED Provider Notes (Signed)
?[facility]-EMERGENCY DEPT ?Provider Note ? ? ?CSN: [id number] ?Arrival date & time: 11/27/21  2001 ? ?  ? ?History ? ?Chief Complaint  ?Patient presents with  ? Vascular Access Problem  ? ? ?Bryan Farrell is a 71 y.o. male. ? ?71 year old male presents after his left upper extremity PICC line was pulled out.  Patient has history of dementia and has no complaints at this time.  Is being treated for osteomyelitis ? ? ?  ? ?Home Medications ?Prior to Admission medications   ?Medication Sig Start Date End Date Taking? Authorizing Provider  ?acetaminophen (TYLENOL) 325 MG tablet Take 650 mg by mouth daily.    [provider]  ?atorvastatin (LIPITOR) 40 MG tablet Take 40 mg by mouth daily. 03/26/20   [provider]  ?buPROPion (WELLBUTRIN) 75 MG tablet Take 75 mg by mouth 2 (two) times daily.    [provider]  ?cholecalciferol (VITAMIN D) 25 MCG (1000 UNIT) tablet Take 1,000 Units by mouth daily. 02/12/20   [provider]  ?divalproex (DEPAKOTE) 125 MG DR tablet Take 125 mg by mouth at bedtime. 08/21/21   [provider]  ?gabapentin (NEURONTIN) 100 MG capsule Take 1 capsule (100 mg total) by mouth 3 (three) times daily. ?Patient taking differently: Take 100 mg by mouth at bedtime. 04/16/19   Benjiman Core, MD  ?insulin glargine (LANTUS) 100 UNIT/ML injection Inject 0.05 mLs (5 Units total) into the skin at bedtime. ?Patient taking differently: Inject 20 Units into the skin at bedtime. 06/03/19   Elige Radon, MD  ?lidocaine, PF, (XYLOCAINE) 1 % SOLN injection 3.2 mLs by Other route daily. Along with Pincus Sanes 08/24/21   [provider]  ?sertraline (ZOLOFT) 100 MG tablet Take 200 mg by mouth daily. 03/20/20   [provider]  ?TRULICITY 1.5 MG/0.5ML SOPN Inject 1.5 mg into the skin once a week. 04/17/20   [provider]  ?   ? ?Allergies    ?Patient has no known allergies.   ? ?Review of Systems   ?Review of Systems  ?Unable to  perform ROS: Dementia  ? ?Physical Exam ?Updated Vital Signs ?BP (!) 157/95 (BP Location: Right Arm)   Pulse 75   Temp 98.5 ?F (36.9 ?C) (Oral)   Resp 16   SpO2 98%  ?Physical Exam ?Vitals and nursing note reviewed.  ?Constitutional:   ?   General: He is not in acute distress. ?   Appearance: Normal appearance. He is well-developed. He is not toxic-appearing.  ?HENT:  ?   Head: Normocephalic and atraumatic.  ?Eyes:  ?   General: Lids are normal.  ?   Conjunctiva/sclera: Conjunctivae normal.  ?   Pupils: Pupils are equal, round, and reactive to light.  ?Neck:  ?   Thyroid: No thyroid mass.  ?   Trachea: No tracheal deviation.  ?Cardiovascular:  ?   Rate and Rhythm: Normal rate and regular rhythm.  ?   Heart sounds: Normal heart sounds. No murmur heard. ?  No gallop.  ?Pulmonary:  ?   Effort: Pulmonary effort is normal. No respiratory distress.  ?   Breath sounds: Normal breath sounds. No stridor. No decreased breath sounds, wheezing, rhonchi or rales.  ?Abdominal:  ?   General: There is no distension.  ?   Palpations: Abdomen is soft.  ?   Tenderness: There is no abdominal tenderness. There is no rebound.  ?Musculoskeletal:     ?   General: No tenderness. Normal range of motion.  ?  Arms: ? ?   Cervical back: Normal range of motion and neck supple.  ?Skin: ?   General: Skin is warm and dry.  ?   Findings: No abrasion or rash.  ?Neurological:  ?   General: No focal deficit present.  ?   Mental Status: He is alert. Mental status is at baseline.  ?   GCS: GCS eye subscore is 4. GCS verbal subscore is 5. GCS motor subscore is 6.  ?   Cranial Nerves: No cranial nerve deficit.  ?   Sensory: No sensory deficit.  ?Psychiatric:     ?   Attention and Perception: Attention normal.  ? ? ?ED Results / Procedures / Treatments   ?Labs ?(all labs ordered are listed, but only abnormal results are displayed) ?Labs Reviewed - No data to display ? ?EKG ?None ? ?Radiology ?No results found. ? ?Procedures ?Procedures   ? ? ?Medications Ordered in ED ?Medications  ?heparin lock flush 100 unit/mL (has no administration in time range)  ? ? ?ED Course/ Medical Decision Making/ A&P ?  ?                        ?Medical Decision Making ?Risk ?Prescription drug management. ? ? ?Plan will be for nursing to place Hep-Lock and patient be discharged back to facility.  Facility will need to arrange patient have an outpatient PICC line ? ? ? ? ? ? ? ?Final Clinical Impression(s) / ED Diagnoses ?Final diagnoses:  ?None  ? ? ?Rx / DC Orders ?ED Discharge Orders   ? ? None  ? ?  ? ? ?  ?Lorre Nick, MD ?11/27/21 2030 ? ?

## 2021-11-27 NOTE — ED Notes (Signed)
Pt states that he did not want to be transported. He asked to wait until tomorrow for the PICC line staff to come to his facility. The facility forced him to be transported.  ?

## 2021-11-27 NOTE — ED Triage Notes (Signed)
Per EMS, patient from [facility], c/o PICC line out last night. Has PICC for antibiotics to treat osteomyelitis. A&Ox3 with dementia. ?

## 2021-11-27 NOTE — ED Notes (Signed)
[facility] called  

## 2021-11-27 NOTE — ED Notes (Signed)
Dr. Freida Busman stated place peripheral IV. ?

## 2021-12-02 ENCOUNTER — Emergency Department (HOSPITAL_BASED_OUTPATIENT_CLINIC_OR_DEPARTMENT_OTHER): Payer: Medicare Other

## 2021-12-02 ENCOUNTER — Emergency Department (HOSPITAL_BASED_OUTPATIENT_CLINIC_OR_DEPARTMENT_OTHER)
Admission: EM | Admit: 2021-12-02 | Discharge: 2021-12-02 | Disposition: A | Discharge status: Home / Self Care | Payer: Medicare Other | Attending: Emergency Medicine | Admitting: Emergency Medicine

## 2021-12-02 ENCOUNTER — Encounter (HOSPITAL_BASED_OUTPATIENT_CLINIC_OR_DEPARTMENT_OTHER): Payer: Self-pay

## 2021-12-02 ENCOUNTER — Other Ambulatory Visit: Payer: Self-pay

## 2021-12-02 DIAGNOSIS — S0990XA Unspecified injury of head, initial encounter: Secondary | ICD-10-CM | POA: Diagnosis not present

## 2021-12-02 DIAGNOSIS — S81802A Unspecified open wound, left lower leg, initial encounter: Secondary | ICD-10-CM | POA: Insufficient documentation

## 2021-12-02 DIAGNOSIS — E119 Type 2 diabetes mellitus without complications: Secondary | ICD-10-CM | POA: Insufficient documentation

## 2021-12-02 DIAGNOSIS — W06XXXA Fall from bed, initial encounter: Secondary | ICD-10-CM | POA: Insufficient documentation

## 2021-12-02 DIAGNOSIS — Y92128 Other place in nursing home as the place of occurrence of the external cause: Secondary | ICD-10-CM | POA: Diagnosis not present

## 2021-12-02 DIAGNOSIS — F0390 Unspecified dementia without behavioral disturbance: Secondary | ICD-10-CM | POA: Insufficient documentation

## 2021-12-02 DIAGNOSIS — S81801A Unspecified open wound, right lower leg, initial encounter: Secondary | ICD-10-CM | POA: Insufficient documentation

## 2021-12-02 DIAGNOSIS — Z794 Long term (current) use of insulin: Secondary | ICD-10-CM | POA: Diagnosis not present

## 2021-12-02 LAB — CBG MONITORING, ED: Glucose-Capillary: 163 mg/dL — ABNORMAL HIGH (ref 70–99)

## 2021-12-02 NOTE — ED Notes (Signed)
Patient provided with meal and drink ?

## 2021-12-02 NOTE — ED Notes (Signed)
Notified staff member Cordelia Pen of patient's return to facility ?

## 2021-12-02 NOTE — Discharge Instructions (Signed)
The CT of your head and neck today did not demonstrate any signs of significant injury. ? ?Please follow and read instructions as provided. ?

## 2021-12-02 NOTE — ED Provider Notes (Signed)
?[facility] EMERGENCY DEPT ?Provider Note ? ? ?CSN: [id number] ?Arrival date & time: 12/02/21  1432 ? ?  ? ?History ? ?Chief Complaint  ?Patient presents with  ? Fall  ? ? ?Bryan Farrell is a 71 y.o. male. ? ?Patient with history of vascular dementia, diabetes, high cholesterol, history of toe amputation --presents to the emergency department from [facility] house after falling and hitting his head.  EMS reported that patient fell out of bed.  Patient states that he thinks that he tripped over something.  He reports hitting the front of his head.  He had a headache but this is now improving.  He denies neck pain.  C-collar was placed prior to my examination.  Patient denies pain in his arms, legs, pelvis.  No treatments prior to arrival otherwise. ? ? ?  ? ?Home Medications ?Prior to Admission medications   ?Medication Sig Start Date End Date Taking? Authorizing Provider  ?acetaminophen (TYLENOL) 325 MG tablet Take 650 mg by mouth daily.    [provider]  ?atorvastatin (LIPITOR) 40 MG tablet Take 40 mg by mouth daily. 03/26/20   [provider]  ?buPROPion (WELLBUTRIN) 75 MG tablet Take 75 mg by mouth 2 (two) times daily.    [provider]  ?cholecalciferol (VITAMIN D) 25 MCG (1000 UNIT) tablet Take 1,000 Units by mouth daily. 02/12/20   [provider]  ?divalproex (DEPAKOTE) 125 MG DR tablet Take 125 mg by mouth at bedtime. 08/21/21   [provider]  ?gabapentin (NEURONTIN) 100 MG capsule Take 1 capsule (100 mg total) by mouth 3 (three) times daily. ?Patient taking differently: Take 100 mg by mouth at bedtime. 04/16/19   Benjiman Core, MD  ?insulin glargine (LANTUS) 100 UNIT/ML injection Inject 0.05 mLs (5 Units total) into the skin at bedtime. ?Patient taking differently: Inject 20 Units into the skin at bedtime. 06/03/19   Elige Radon, MD  ?lidocaine, PF, (XYLOCAINE) 1 % SOLN injection 3.2 mLs by Other route daily. Along with Pincus Sanes 08/24/21    [provider]  ?sertraline (ZOLOFT) 100 MG tablet Take 200 mg by mouth daily. 03/20/20   [provider]  ?TRULICITY 1.5 MG/0.5ML SOPN Inject 1.5 mg into the skin once a week. 04/17/20   [provider]  ?   ? ?Allergies    ?Patient has no known allergies.   ? ?Review of Systems   ?Review of Systems ? ?Physical Exam ?Updated Vital Signs ?BP 132/81   Pulse 72   Temp 98.4 ?F (36.9 ?C) (Oral)   Resp 16   SpO2 94%  ?Physical Exam ?Vitals and nursing note reviewed.  ?Constitutional:   ?   Appearance: He is well-developed.  ?HENT:  ?   Head: Normocephalic and atraumatic. No raccoon eyes or Battle's sign.  ?   Right Ear: Tympanic membrane, ear canal and external ear normal. No hemotympanum.  ?   Left Ear: Tympanic membrane, ear canal and external ear normal. No hemotympanum.  ?   Nose: Nose normal.  ?Eyes:  ?   General: Lids are normal.  ?   Conjunctiva/sclera: Conjunctivae normal.  ?   Pupils: Pupils are equal, round, and reactive to light.  ?   Comments: No visible hyphema  ?Neck:  ?   Comments: Immobilized in rigid cervical collar. ?Cardiovascular:  ?   Rate and Rhythm: Normal rate and regular rhythm.  ?Pulmonary:  ?   Effort: Pulmonary effort is normal.  ?   Breath sounds: Normal breath sounds.  ?  Abdominal:  ?   Palpations: Abdomen is soft.  ?   Tenderness: There is no abdominal tenderness.  ?Musculoskeletal:  ?   Cervical back: Neck supple. No tenderness or bony tenderness.  ?   Thoracic back: No tenderness or bony tenderness.  ?   Lumbar back: No tenderness or bony tenderness.  ?   Comments: No extremity tenderness.  Patient reports no tenderness when I push over the pelvis.  ?Skin: ?   General: Skin is warm and dry.  ?   Comments: Several noninfected appearing wounds to bilateral lower extremities and feet.  ?Neurological:  ?   Mental Status: He is alert.  ?   GCS: GCS eye subscore is 4. GCS verbal subscore is 5. GCS motor subscore is 6.  ?   Cranial Nerves: No cranial nerve deficit.  ?    Sensory: No sensory deficit.  ? ? ?ED Results / Procedures / Treatments   ?Labs ?(all labs ordered are listed, but only abnormal results are displayed) ?Labs Reviewed  ?CBG MONITORING, ED - Abnormal; Notable for the following components:  ?    Result Value  ? Glucose-Capillary 163 (*)   ? All other components within normal limits  ? ? ?EKG ?None ? ?Radiology ?CT Head Wo Contrast ? ?Result Date: 12/02/2021 ?CLINICAL DATA:  Head trauma, minor (Age >= 65y); Neck trauma (Age >= 65y). Fall EXAM: CT HEAD WITHOUT CONTRAST CT CERVICAL SPINE WITHOUT CONTRAST TECHNIQUE: Multidetector CT imaging of the head and cervical spine was performed following the standard protocol without intravenous contrast. Multiplanar CT image reconstructions of the cervical spine were also generated. RADIATION DOSE REDUCTION: This exam was performed according to the departmental dose-optimization program which includes automated exposure control, adjustment of the mA and/or kV according to patient size and/or use of iterative reconstruction technique. COMPARISON:  CT head 05/19/2017, CT C-spine 07/11/2006 FINDINGS: CT HEAD FINDINGS BRAIN: BRAIN Cerebral ventricle sizes are concordant with the degree of cerebral volume loss. Patchy and confluent areas of decreased attenuation are noted throughout the deep and periventricular white matter of the cerebral hemispheres bilaterally, compatible with chronic microvascular ischemic disease. No evidence of large-territorial acute infarction. No parenchymal hemorrhage. No mass lesion. No extra-axial collection. No mass effect or midline shift. No hydrocephalus. Basilar cisterns are patent. Vascular: No hyperdense vessel. Skull: No acute fracture or focal lesion. Sinuses/Orbits: Paranasal sinuses and mastoid air cells are clear. The orbits are unremarkable. Other: None. CT CERVICAL SPINE FINDINGS Alignment: Normal. Skull base and vertebrae: Interval worsening of multilevel degenerative changes of the spine  most prominent at the C6-C7 level. Associated moderate osseous neural foraminal stenosis at the C6-C7 level. No associated severe osseous or neural foraminal stenosis. No acute fracture. No aggressive appearing focal osseous lesion or focal pathologic process. Soft tissues and spinal canal: No prevertebral fluid or swelling. No visible canal hematoma. Upper chest: Unremarkable. Other: Atherosclerotic plaque of the carotid arteries within the neck. IMPRESSION: 1. No acute intracranial abnormality. 2. No acute displaced fracture or traumatic listhesis of the cervical spine. Electronically Signed   By: Tish FredericksonMorgane  Naveau M.D.   On: 12/02/2021 15:40  ? ?CT Cervical Spine Wo Contrast ? ?Result Date: 12/02/2021 ?CLINICAL DATA:  Head trauma, minor (Age >= 65y); Neck trauma (Age >= 65y). Fall EXAM: CT HEAD WITHOUT CONTRAST CT CERVICAL SPINE WITHOUT CONTRAST TECHNIQUE: Multidetector CT imaging of the head and cervical spine was performed following the standard protocol without intravenous contrast. Multiplanar CT image reconstructions of the cervical spine were also generated.  RADIATION DOSE REDUCTION: This exam was performed according to the departmental dose-optimization program which includes automated exposure control, adjustment of the mA and/or kV according to patient size and/or use of iterative reconstruction technique. COMPARISON:  CT head 05/19/2017, CT C-spine 07/11/2006 FINDINGS: CT HEAD FINDINGS BRAIN: BRAIN Cerebral ventricle sizes are concordant with the degree of cerebral volume loss. Patchy and confluent areas of decreased attenuation are noted throughout the deep and periventricular white matter of the cerebral hemispheres bilaterally, compatible with chronic microvascular ischemic disease. No evidence of large-territorial acute infarction. No parenchymal hemorrhage. No mass lesion. No extra-axial collection. No mass effect or midline shift. No hydrocephalus. Basilar cisterns are patent. Vascular: No hyperdense  vessel. Skull: No acute fracture or focal lesion. Sinuses/Orbits: Paranasal sinuses and mastoid air cells are clear. The orbits are unremarkable. Other: None. CT CERVICAL SPINE FINDINGS Alignment: Normal. Sku

## 2021-12-02 NOTE — ED Notes (Signed)
[facility] called for transport.  

## 2021-12-02 NOTE — ED Triage Notes (Signed)
Patient brought in via EMS from [facility]. Per the staff patient fell out of bed hitting head. Patient now c/o headache. Denis blood thinners  ?

## 2021-12-02 NOTE — ED Notes (Signed)
Attempted to call [facility] with no answer. ?

## 2021-12-05 ENCOUNTER — Encounter: Payer: Self-pay | Admitting: Orthopedic Surgery

## 2021-12-05 ENCOUNTER — Ambulatory Visit (INDEPENDENT_AMBULATORY_CARE_PROVIDER_SITE_OTHER): Payer: Medicare Other | Admitting: Orthopedic Surgery

## 2021-12-05 DIAGNOSIS — M869 Osteomyelitis, unspecified: Secondary | ICD-10-CM | POA: Diagnosis not present

## 2021-12-05 NOTE — Progress Notes (Signed)
? ?Office Visit Note ?  ?Patient: Bryan Farrell           ?Date of Birth: 01-23-1951           ?MRN: [id number] ?Visit Date: 12/05/2021 ?             ?Requested by: Eloisa Northern, MD ?[address] ?[address] ?[place],  [place] [address] ?PCP: Eloisa Northern, MD ? ?Chief Complaint  ?Patient presents with  ? Right Foot - Pain  ? ? ? ? ?HPI: ?Patient is a 71 year old gentleman who presents with ulceration osteomyelitis right foot second toe.  Patient is currently a resident at [facility] health care.  He is on IV vancomycin. ? ?Assessment & Plan: ?Visit Diagnoses:  ?1. Toe osteomyelitis, left (HCC)   ? ? ?Plan: We will plan for outpatient surgery at [facility] for an amputation of the right second toe.  Patient will need to return to [facility] health care postoperatively. ? ?Follow-Up Instructions: Return in about 2 weeks (around 12/19/2021).  ? ?Ortho Exam ? ?Patient is alert, oriented, no adenopathy, well-dressed, normal affect, normal respiratory effort. ?Patient's previous ankle-brachial indices 2 years ago showed normal triphasic flow.  The Doppler was used today and patient has consistent triphasic dorsalis pedis and posterior tibial flow with no clinical signs of a change.  He has exposed bone with a necrotic right second toe with sausage digit swelling status post right great toe amputation.  Hemoglobin A1c in 2020 was 9.0.  Most recent albumin is 1.8. ? ?Imaging: ?No results found. ?No images are attached to the encounter. ? ?Labs: ?Lab Results  ?Component Value Date  ? HGBA1C 9.0 (H) 05/28/2019  ? ESRSEDRATE 82 (H) 08/27/2021  ? CRP 29.6 (H) 08/27/2021  ? REPTSTATUS 08/26/2021 FINAL 08/25/2021  ? CULT  08/25/2021  ?  NO GROWTH ?Performed at [facility], [address] [place], [place] [address] ?  ? 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 ? ? ?Lab Results  ?Component Value Date  ? ALBUMIN 1.8 (L) 08/29/2021  ? ALBUMIN 2.1 (L) 08/27/2021  ? ALBUMIN 2.1 (L) 08/26/2021  ? ? ?Lab Results  ?Component Value  Date  ? MG 1.9 08/30/2021  ? MG 2.0 08/29/2021  ? MG 2.2 08/28/2021  ? ?No results found for: VD25OH ? ?No results found for: PREALBUMIN ? ?  Latest Ref Rng & Units 09/02/2021  ?  4:22 AM 09/01/2021  ?  4:13 AM 08/31/2021  ?  9:01 AM  ?CBC EXTENDED  ?WBC 4.0 - 10.5 K/uL 11.9   13.2   14.0    ?RBC 4.22 - 5.81 MIL/uL 3.59   3.74   3.73    ?Hemoglobin 13.0 - 17.0 g/dL 45.8   59.2   92.4    ?HCT 39.0 - 52.0 % 32.2   33.1   33.3    ?Platelets 150 - 400 K/uL 477   418   410    ? ? ? ?There is no height or weight on file to calculate BMI. ? ?Orders:  ?No orders of the defined types were placed in this encounter. ? ?No orders of the defined types were placed in this encounter. ? ? ? Procedures: ?No procedures performed ? ?Clinical Data: ?No additional findings. ? ?ROS: ? ?All other systems negative, except as noted in the HPI. ?Review of Systems ? ?Objective: ?Vital Signs: There were no vitals taken for this visit. ? ?Specialty Comments:  ?No specialty comments available. ? ?PMFS History: ?Patient  Active Problem List  ? Diagnosis Date Noted  ? AKI (acute kidney injury) (HCC) 09/02/2021  ? Hypokalemia 09/02/2021  ? Influenza A 09/02/2021  ? Sepsis due to cellulitis (HCC) 08/25/2021  ? 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HCC)   ? Bacteremia due to Enterococcus 05/29/2019  ? Toe osteomyelitis, left (HCC)   ? 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 Mild protein-calorie malnutrition (HCC)   ? Osteomyelitis (HCC) 05/27/2019  ? Moderate recurrent major depression (HCC)   ? Adjustment disorder with depressed mood 04/13/2017  ? Vitamin B12 deficiency 03/25/2017  ? Vascular dementia (HCC) 03/23/2017  ? Suicidal ideation 03/23/2017  ? Homelessness 03/23/2017  ? Diabetes (HCC) 03/23/2017  ? ?Past Medical History:  ?Diagnosis Date  ? Diabetes mellitus without complication (HCC)   ? Gangrene (HCC) 05/28/2019  ? left second toe  ? Hypertension   ?  ?Family History  ?Problem Relation Age of Onset  ? Heart Problems Mother   ? Heart Problems  Father   ?  ?Past Surgical History:  ?Procedure Laterality Date  ? AMPUTATION Left 05/30/2019  ? Procedure: LEFT FOOT 2ND RAY AMPUTATION;  Surgeon: Nadara Mustard, MD;  Location: [facility] OR;  Service: Orthopedics;  Laterality: Left;  ? TOE AMPUTATION Right   ? TONSILLECTOMY    ? ?Social History  ? ?Occupational History  ? Not on file  ?Tobacco Use  ? Smoking status: Never  ? Smokeless tobacco: Never  ?Vaping Use  ? Vaping Use: Never used  ?Substance and Sexual Activity  ? Alcohol use: Not Currently  ? Drug use: No  ? Sexual activity: Not Currently  ? ? ? ? ? ?

## 2021-12-13 NOTE — Progress Notes (Signed)
Attempted multiple times to contact pt for pre-op instructions, cannot be reached. All number on file attempted. Unable to leave VM.  ?

## 2021-12-14 ENCOUNTER — Encounter (HOSPITAL_COMMUNITY): Admission: RE | Payer: Self-pay | Source: Home / Self Care

## 2021-12-14 ENCOUNTER — Encounter (HOSPITAL_COMMUNITY): Payer: Self-pay | Admitting: General Practice

## 2021-12-14 ENCOUNTER — Encounter (HOSPITAL_COMMUNITY): Payer: Self-pay | Admitting: Orthopedic Surgery

## 2021-12-14 ENCOUNTER — Ambulatory Visit (HOSPITAL_COMMUNITY): Admission: RE | Admit: 2021-12-14 | Payer: Medicare Other | Source: Home / Self Care | Admitting: Orthopedic Surgery

## 2021-12-14 SURGERY — AMPUTATION DIGIT
Anesthesia: Choice | Laterality: Right

## 2021-12-14 NOTE — H&P (Signed)
Bryan Farrell is an 71 y.o. male.   ?Chief Complaint: Osteomyelitis ulceration right foot second toe. ?HPI: Patient is a 71 year old gentleman who presents with ulceration osteomyelitis right foot second toe.  Patient is currently a resident at [facility].  He is on IV vancomycin. ? ?Past Medical History:  ?Diagnosis Date  ? Diabetes mellitus without complication ([place])   ? Gangrene ([place]) 05/28/2019  ? left second toe  ? Hypertension   ? ? ?Past Surgical History:  ?Procedure Laterality Date  ? AMPUTATION Left 05/30/2019  ? Procedure: LEFT FOOT 2ND RAY AMPUTATION;  Surgeon: [name], MD;  Location: [facility];  Service: Orthopedics;  Laterality: Left;  ? TOE AMPUTATION Right   ? TONSILLECTOMY    ? ? ?Family History  ?Problem Relation Age of Onset  ? Heart Problems Mother   ? Heart Problems Father   ? ?Social History:  reports that he has never smoked. He has never used smokeless tobacco. He reports that he does not currently use alcohol. He reports that he does not use drugs. ? ?Allergies: No Known Allergies ? ?No medications prior to admission.  ? ? ?No results found for this or any previous visit (from the past 48 hour(s)). ?No results found. ? ?Review of Systems  ?All other systems reviewed and are negative. ? ?There were no vitals taken for this visit. ?Physical Exam  ?Patient is alert, oriented, no adenopathy, well-dressed, normal affect, normal respiratory effort. ?Patient's previous ankle-brachial indices 2 years ago showed normal triphasic flow.  The Doppler was used today and patient has consistent triphasic dorsalis pedis and posterior tibial flow with no clinical signs of a change.  He has exposed bone with a necrotic right second toe with sausage digit swelling status post right great toe amputation.  Hemoglobin A1c in 2020 was 9.0.  Most recent albumin is 1.8. ?Assessment/Plan ?1. Toe osteomyelitis, left ([place])   ?  ?  ?Plan: We will plan for outpatient surgery at [facility] for an amputation of  the right second toe.  Patient will need to return to [facility] care postoperatively. ? ?[name], MD ?12/14/2021, 6:44 AM ? ? ? ?

## 2021-12-14 NOTE — Anesthesia Preprocedure Evaluation (Deleted)
Anesthesia Evaluation  ? ? ?Airway ? ? ? ? ? ? ? Dental ?  ?Pulmonary ?neg pulmonary ROS,  ?  ? ? ? ? ? ? ? Cardiovascular ?hypertension,  ? ? ?  ?Neuro/Psych ?Depression Dementia negative neurological ROS ?   ? GI/Hepatic ?negative GI ROS, Neg liver ROS,   ?Endo/Other  ?diabetes, Poorly Controlled, Type 2, Insulin Dependent ? Renal/GU ?  ?negative genitourinary ?  ?Musculoskeletal ?Osteomyelitis right 2nd toe   ? Abdominal ?  ?Peds ? Hematology ?negative hematology ROS ?(+)   ?Anesthesia Other Findings ? ? Reproductive/Obstetrics ? ?  ? ? ? ? ? ? ? ? ? ? ? ? ? ?  ?  ? ? ? ? ? ? ? ? ?Anesthesia Physical ?Anesthesia Plan ? ?ASA: 3 ? ?Anesthesia Plan: MAC  ? ?Post-op Pain Management:   ? ?Induction: Intravenous ? ?PONV Risk Score and Plan: 1 and Ondansetron, Dexamethasone, Propofol infusion and Treatment may vary due to age or medical condition ? ?Airway Management Planned: Simple Face Mask, Natural Airway and Nasal Cannula ? ?Additional Equipment: None ? ?Intra-op Plan:  ? ?Post-operative Plan:  ? ?Informed Consent:  ? ?Plan Discussed with:  ? ?Anesthesia Plan Comments: (Lab Results ?     Component                Value               Date                 ?     WBC                      11.9 (H)            09/02/2021           ?     HGB                      10.5 (L)            09/02/2021           ?     HCT                      32.2 (L)            09/02/2021           ?     MCV                      89.7                09/02/2021           ?     PLT                      477 (H)             09/02/2021           ?Lab Results ?     Component                Value               Date                 ?     NA  137                 09/02/2021           ?     K                        3.3 (L)             09/02/2021           ?     CO2                      30                  09/02/2021           ?     GLUCOSE                  106 (H)             09/02/2021           ?     BUN                       16                  09/02/2021           ?     CREATININE               1.03                09/02/2021           ?     CALCIUM                  7.4 (L)             09/02/2021           ?     GFRNONAA                 >60                 09/02/2021          )  ? ? ? ? ? ? ?Anesthesia Quick Evaluation ? ?

## 2021-12-27 ENCOUNTER — Encounter (HOSPITAL_COMMUNITY): Payer: Self-pay | Admitting: Orthopedic Surgery

## 2021-12-27 NOTE — Progress Notes (Signed)
?------------- ? ?  SDW INSTRUCTIONS: ? ?Your procedure is scheduled on 5/17. Please report to [facility] Main Entrance "A" at 07:15 A.M., and check in at the Admitting office. Call this number if you have problems the morning of surgery: [phone] ? ? ?Remember: Do not eat after midnight the night before your surgery ? ?You may drink clear liquids until 06:45AM the morning of your surgery.   ?Clear liquids allowed are: Water, Non-Citrus Juices (without pulp), Carbonated Beverages, Clear Tea, Black Coffee Only, and Gatorade ?  ?Medications to take morning of surgery with a sip of water include: ?acetaminophen (TYLENOL)  ?buPROPion [facility]) ?gabapentin (NEURONTIN)  ?sertraline (ZOLOFT)  ? ?** PLEASE check your blood sugar the morning of your surgery when you wake up and every 2 hours until you get to the Short Stay unit. ? ?If your blood sugar is less than 70 mg/dL, you will need to treat for low blood sugar: ?Do not take insulin. ?Treat a low blood sugar (less than 70 mg/dL) with ? cup of clear juice (cranberry or apple), 4 glucose tablets, OR glucose gel. ?Recheck blood sugar in 15 minutes after treatment (to make sure it is greater than 70 mg/dL). If your blood sugar is not greater than 70 mg/dL on recheck, call [phone] for further instructions. ? ?5/16 - 2.5units ?5/17 - none ? ?As of today, STOP taking any Aspirin (unless otherwise instructed by your surgeon), Aleve, Naproxen, Ibuprofen, Motrin, Advil, Goody's, BC's, all herbal medications, fish oil, and all vitamins. ? ?  ?The Morning of Surgery ?Do not wear jewelry ?Do not wear lotions, powders, colognes, or deodorant ?Do not bring valuables to the hospital. ?[facility] is not responsible for any belongings or valuables. ? ?If you are a smoker, DO NOT Smoke 24 hours prior to surgery ? ?If you wear a CPAP at night please bring your mask the morning of surgery  ? ?Remember that you must have someone to transport you home after your surgery, and  remain with you for 24 hours if you are discharged the same day. ? ?Please bring cases for contacts, glasses, hearing aids, dentures or bridgework because it cannot be worn into surgery.  ? ?Patients discharged the day of surgery will not be allowed to drive home.  ? ?Please shower the NIGHT BEFORE/MORNING OF SURGERY (use antibacterial soap like DIAL soap if possible). Wear comfortable clothes the morning of surgery. Oral Hygiene is also important to reduce your risk of infection.  Remember - BRUSH YOUR TEETH THE MORNING OF SURGERY WITH YOUR REGULAR TOOTHPASTE ? ?Patient denies shortness of breath, fever, cough and chest pain.  ? ? ?   ? ?

## 2021-12-28 ENCOUNTER — Other Ambulatory Visit: Payer: Self-pay

## 2021-12-28 ENCOUNTER — Ambulatory Visit (HOSPITAL_BASED_OUTPATIENT_CLINIC_OR_DEPARTMENT_OTHER): Payer: Medicare Other | Admitting: Anesthesiology

## 2021-12-28 ENCOUNTER — Encounter (HOSPITAL_COMMUNITY)
Admission: RE | Disposition: A | Discharge status: Rehabilitation Facility | Payer: Self-pay | Source: Home / Self Care | Attending: Orthopedic Surgery

## 2021-12-28 ENCOUNTER — Encounter (HOSPITAL_COMMUNITY): Payer: Self-pay | Admitting: Orthopedic Surgery

## 2021-12-28 ENCOUNTER — Ambulatory Visit (HOSPITAL_COMMUNITY): Payer: Medicare Other | Admitting: Anesthesiology

## 2021-12-28 ENCOUNTER — Ambulatory Visit (HOSPITAL_COMMUNITY)
Admission: RE | Admit: 2021-12-28 | Discharge: 2021-12-28 | Disposition: A | Discharge status: Rehabilitation Facility | Payer: Medicare Other | Attending: Orthopedic Surgery | Admitting: Orthopedic Surgery

## 2021-12-28 DIAGNOSIS — N289 Disorder of kidney and ureter, unspecified: Secondary | ICD-10-CM | POA: Insufficient documentation

## 2021-12-28 DIAGNOSIS — E1169 Type 2 diabetes mellitus with other specified complication: Secondary | ICD-10-CM | POA: Insufficient documentation

## 2021-12-28 DIAGNOSIS — F32A Depression, unspecified: Secondary | ICD-10-CM | POA: Insufficient documentation

## 2021-12-28 DIAGNOSIS — M869 Osteomyelitis, unspecified: Secondary | ICD-10-CM

## 2021-12-28 DIAGNOSIS — I10 Essential (primary) hypertension: Secondary | ICD-10-CM

## 2021-12-28 DIAGNOSIS — E1165 Type 2 diabetes mellitus with hyperglycemia: Secondary | ICD-10-CM

## 2021-12-28 HISTORY — PX: AMPUTATION: SHX166

## 2021-12-28 LAB — BASIC METABOLIC PANEL
Anion gap: 8 (ref 5–15)
BUN: 23 mg/dL (ref 8–23)
CO2: 29 mmol/L (ref 22–32)
Calcium: 9.4 mg/dL (ref 8.9–10.3)
Chloride: 103 mmol/L (ref 98–111)
Creatinine, Ser: 1.9 mg/dL — ABNORMAL HIGH (ref 0.61–1.24)
GFR, Estimated: 38 mL/min — ABNORMAL LOW (ref >60)
Glucose, Bld: 127 mg/dL — ABNORMAL HIGH (ref 70–99)
Potassium: 4 mmol/L (ref 3.5–5.1)
Sodium: 140 mmol/L (ref 135–145)

## 2021-12-28 LAB — GLUCOSE, CAPILLARY
Glucose-Capillary: 121 mg/dL — ABNORMAL HIGH (ref 70–99)
Glucose-Capillary: 122 mg/dL — ABNORMAL HIGH (ref 70–99)
Glucose-Capillary: 135 mg/dL — ABNORMAL HIGH (ref 70–99)

## 2021-12-28 SURGERY — AMPUTATION DIGIT
Anesthesia: Monitor Anesthesia Care | Laterality: Right

## 2021-12-28 MED ORDER — LIDOCAINE HCL (PF) 1 % IJ SOLN
INTRAMUSCULAR | Status: DC | PRN
  Administered 2021-12-28: 10 mL

## 2021-12-28 MED ORDER — CEFAZOLIN SODIUM-DEXTROSE 2-4 GM/100ML-% IV SOLN
INTRAVENOUS | Status: AC
  Filled 2021-12-28: qty 100

## 2021-12-28 MED ORDER — PROMETHAZINE HCL 25 MG/ML IJ SOLN: 6.2500 mg | INTRAMUSCULAR | Status: DC | PRN

## 2021-12-28 MED ORDER — DEXMEDETOMIDINE (PRECEDEX) IN NS 20 MCG/5ML (4 MCG/ML) IV SYRINGE
PREFILLED_SYRINGE | INTRAVENOUS | Status: DC | PRN
  Administered 2021-12-28: 4 ug via INTRAVENOUS

## 2021-12-28 MED ORDER — CHLORHEXIDINE GLUCONATE 0.12 % MT SOLN
15.0000 mL | Freq: Once | OROMUCOSAL | Status: AC
  Administered 2021-12-28: 15 mL via OROMUCOSAL
  Filled 2021-12-28: qty 15

## 2021-12-28 MED ORDER — AMISULPRIDE (ANTIEMETIC) 5 MG/2ML IV SOLN
10.0000 mg | Freq: Once | INTRAVENOUS | Status: AC
  Administered 2021-12-28: 10 mg via INTRAVENOUS

## 2021-12-28 MED ORDER — PROPOFOL 10 MG/ML IV BOLUS
INTRAVENOUS | Status: DC | PRN
  Administered 2021-12-28: 10 mg via INTRAVENOUS
  Administered 2021-12-28: 20 mg via INTRAVENOUS

## 2021-12-28 MED ORDER — LACTATED RINGERS IV SOLN: INTRAVENOUS | Status: DC

## 2021-12-28 MED ORDER — AMISULPRIDE (ANTIEMETIC) 5 MG/2ML IV SOLN
INTRAVENOUS | Status: AC
  Filled 2021-12-28: qty 4

## 2021-12-28 MED ORDER — MIDAZOLAM HCL 2 MG/2ML IJ SOLN
INTRAMUSCULAR | Status: AC
  Filled 2021-12-28: qty 2

## 2021-12-28 MED ORDER — FENTANYL CITRATE (PF) 250 MCG/5ML IJ SOLN
INTRAMUSCULAR | Status: AC
  Filled 2021-12-28: qty 5

## 2021-12-28 MED ORDER — INSULIN ASPART 100 UNIT/ML IJ SOLN: 0.0000 [IU] | INTRAMUSCULAR | Status: DC | PRN

## 2021-12-28 MED ORDER — 0.9 % SODIUM CHLORIDE (POUR BTL) OPTIME
TOPICAL | Status: DC | PRN
  Administered 2021-12-28: 1000 mL

## 2021-12-28 MED ORDER — CEFAZOLIN SODIUM-DEXTROSE 2-3 GM-%(50ML) IV SOLR
INTRAVENOUS | Status: DC | PRN
  Administered 2021-12-28: 2 g via INTRAVENOUS

## 2021-12-28 MED ORDER — OXYCODONE HCL 5 MG PO TABS: 5.0000 mg | ORAL_TABLET | Freq: Once | ORAL | Status: DC | PRN

## 2021-12-28 MED ORDER — ONDANSETRON HCL 4 MG/2ML IJ SOLN
INTRAMUSCULAR | Status: DC | PRN
  Administered 2021-12-28: 4 mg via INTRAVENOUS

## 2021-12-28 MED ORDER — LIDOCAINE HCL (PF) 1 % IJ SOLN
INTRAMUSCULAR | Status: AC
  Filled 2021-12-28: qty 30

## 2021-12-28 MED ORDER — FENTANYL CITRATE (PF) 100 MCG/2ML IJ SOLN
INTRAMUSCULAR | Status: DC | PRN
  Administered 2021-12-28: 50 ug via INTRAVENOUS

## 2021-12-28 MED ORDER — HYDROMORPHONE HCL 1 MG/ML IJ SOLN: 0.2500 mg | INTRAMUSCULAR | Status: DC | PRN

## 2021-12-28 MED ORDER — ORAL CARE MOUTH RINSE: 15.0000 mL | Freq: Once | OROMUCOSAL | Status: AC

## 2021-12-28 MED ORDER — OXYCODONE HCL 5 MG/5ML PO SOLN: 5.0000 mg | Freq: Once | ORAL | Status: DC | PRN

## 2021-12-28 SURGICAL SUPPLY — 30 items
BAG COUNTER SPONGE SURGICOUNT (BAG) ×3 IMPLANT
BAG SPNG CNTER NS LX DISP (BAG) ×1
BLADE SURG 21 STRL SS (BLADE) ×3 IMPLANT
BNDG CMPR 9X4 STRL LF SNTH (GAUZE/BANDAGES/DRESSINGS)
BNDG COHESIVE 4X5 TAN STRL (GAUZE/BANDAGES/DRESSINGS) ×3 IMPLANT
BNDG ESMARK 4X9 LF (GAUZE/BANDAGES/DRESSINGS) IMPLANT
BNDG GAUZE ELAST 4 BULKY (GAUZE/BANDAGES/DRESSINGS) ×3 IMPLANT
COVER SURGICAL LIGHT HANDLE (MISCELLANEOUS) ×6 IMPLANT
DRAPE U-SHAPE 47X51 STRL (DRAPES) ×3 IMPLANT
DRSG ADAPTIC 3X8 NADH LF (GAUZE/BANDAGES/DRESSINGS) ×1 IMPLANT
DRSG PAD ABDOMINAL 8X10 ST (GAUZE/BANDAGES/DRESSINGS) ×3 IMPLANT
DURAPREP 26ML APPLICATOR (WOUND CARE) ×3 IMPLANT
ELECT REM PT RETURN 9FT ADLT (ELECTROSURGICAL) ×2
ELECTRODE REM PT RTRN 9FT ADLT (ELECTROSURGICAL) ×2 IMPLANT
GAUZE SPONGE 4X4 12PLY STRL (GAUZE/BANDAGES/DRESSINGS) ×1 IMPLANT
GLOVE BIOGEL PI IND STRL 9 (GLOVE) ×2 IMPLANT
GLOVE BIOGEL PI INDICATOR 9 (GLOVE) ×1
GLOVE SURG ORTHO 9.0 STRL STRW (GLOVE) ×3 IMPLANT
GOWN STRL REUS W/ TWL XL LVL3 (GOWN DISPOSABLE) ×4 IMPLANT
GOWN STRL REUS W/TWL XL LVL3 (GOWN DISPOSABLE) ×4
KIT BASIN OR (CUSTOM PROCEDURE TRAY) ×3 IMPLANT
KIT TURNOVER KIT B (KITS) ×3 IMPLANT
MANIFOLD NEPTUNE II (INSTRUMENTS) ×3 IMPLANT
NEEDLE 22X1 1/2 (OR ONLY) (NEEDLE) IMPLANT
NS IRRIG 1000ML POUR BTL (IV SOLUTION) ×3 IMPLANT
PACK ORTHO EXTREMITY (CUSTOM PROCEDURE TRAY) ×3 IMPLANT
PAD ARMBOARD 7.5X6 YLW CONV (MISCELLANEOUS) ×6 IMPLANT
SUT ETHILON 2 0 PSLX (SUTURE) ×4 IMPLANT
SYR CONTROL 10ML LL (SYRINGE) IMPLANT
TOWEL GREEN STERILE (TOWEL DISPOSABLE) ×3 IMPLANT

## 2021-12-28 NOTE — Interval H&P Note (Signed)
History and Physical Interval Note: ? ?12/28/2021 ?10:38 AM ? ?Bryan Farrell  has presented today for surgery, with the diagnosis of Osteomyelitis Right 2nd Toe.  The various methods of treatment have been discussed with the patient and family. After consideration of risks, benefits and other options for treatment, the patient has consented to  Procedure(s): ?RIGHT 2ND TOE AMPUTATION (Right) as a surgical intervention.  The patient's history has been reviewed, patient examined, no change in status, stable for surgery.  I have reviewed the patient's chart and labs.  Questions were answered to the patient's satisfaction.   ? ? ?[name] ? ? ?

## 2021-12-28 NOTE — Transfer of Care (Signed)
Immediate Anesthesia Transfer of Care Note ? ?Patient: Bryan Farrell ? ?Procedure(s) Performed: RIGHT 2ND TOE AMPUTATION (Right) ? ?Patient Location: PACU ? ?Anesthesia Type:MAC ? ?Level of Consciousness: awake, alert  and oriented ? ?Airway & Oxygen Therapy: Patient Spontanous Breathing and Patient connected to nasal cannula oxygen ? ?Post-op Assessment: Report given to RN and Post -op Vital signs reviewed and stable ? ?Post vital signs: Reviewed and stable ? ?Last Vitals:  ?Vitals Value Taken Time  ?BP 165/105 12/28/21 1108  ?Temp    ?Pulse 78 12/28/21 1112  ?Resp 12 12/28/21 1112  ?SpO2 100 % 12/28/21 1112  ?Vitals shown include unvalidated device data. ? ?Last Pain:  ?Vitals:  ? 12/28/21 0845  ?TempSrc:   ?PainSc: 0-No pain  ?   ? ?  ? ?Complications: No notable events documented. ?

## 2021-12-28 NOTE — Op Note (Signed)
12/28/2021 ? ?11:18 AM ? ?PATIENT:  Bryan Farrell   ? ?PRE-OPERATIVE DIAGNOSIS:  Osteomyelitis Right 2nd Toe ? ?POST-OPERATIVE DIAGNOSIS:  Same ? ?PROCEDURE:  RIGHT 2ND TOE AMPUTATION ? ?SURGEON:  Nadara Mustard, MD ? ?PHYSICIAN ASSISTANT:None ?ANESTHESIA:   General ? ?PREOPERATIVE INDICATIONS:  [name] is a  71 y.o. male with a diagnosis of Osteomyelitis Right 2nd Toe who failed conservative measures and elected for surgical management.   ? ?The risks benefits and alternatives were discussed with the patient preoperatively including but not limited to the risks of infection, bleeding, nerve injury, cardiopulmonary complications, the need for revision surgery, among others, and the patient was willing to proceed. ? ?OPERATIVE IMPLANTS: None ? ?@ENCIMAGES @ ? ?OPERATIVE FINDINGS: Tissue margins clear good bleeding ? ?OPERATIVE PROCEDURE: Patient was brought the operating room and the right lower extremity was prepped using DuraPrep draped into a sterile field a timeout was called.  Patient underwent a local anesthesia with 10 cc of 1% lidocaine plain.  After adequate levels anesthesia were obtained a fishmouth incision was made just distal to the MTP joint and the second toe was amputated through the MTP joint.  The wound was irrigated with normal saline there was good petechial bleeding.  Incision closed using 2-0 nylon a sterile dressing was applied patient was taken the PACU in stable condition ? ? ?DISCHARGE PLANNING: ? ?Antibiotic duration: Preoperative antibiotics ? ?Weightbearing: Weightbearing as tolerated on the right ? ?Pain medication: Continue with Tylenol for pain ? ?Dressing care/ Wound VAC: Reinforce dressing as needed ? ?Ambulatory devices: Walker ? ?Discharge to: Discharge back to skilled nursing ? ?Follow-up: In the office 1 week post operative. ? ? ? ? ? ? ?  ?

## 2021-12-28 NOTE — Progress Notes (Signed)
Pt not sure which medicines he took this morning. [employer] to review medicines but unable to reach anyone. ?

## 2021-12-28 NOTE — Op Note (Signed)
Discharge Diagnoses:  ?Active Problems: ?  Toe osteomyelitis, right (HCC) ? ? ?Surgeries: Procedure(s): ?RIGHT 2ND TOE AMPUTATION on 12/28/2021  ?  ?Consultants:  ? ?Discharged Condition: Improved ? ?Hospital Course: Bryan Farrell is an 71 y.o. male who was admitted 12/28/2021 with a chief complaint of osteomyelitis right second toe, with a final diagnosis of Osteomyelitis Right 2nd Toe.  Patient was brought to the operating room on 12/28/2021 and underwent Procedure(s): ?RIGHT 2ND TOE AMPUTATION.   ? ?Patient was given perioperative antibiotics:  ?Anti-infectives (From admission, onward)  ? ? Start     Dose/Rate Route Frequency Ordered Stop  ? 12/28/21 1027  ceFAZolin (ANCEF) 2-4 GM/100ML-% IVPB       ?Note to Pharmacy: Dorinda Hill: cabinet override  ?    12/28/21 1027 12/28/21 2244  ? ?  ?. ? ?Patient was given sequential compression devices, early ambulation, and aspirin for DVT prophylaxis. ? ?Recent vital signs: Patient Vitals for the past 24 hrs: ? BP Temp Temp src Pulse Resp SpO2 Height Weight  ?12/28/21 0822 (!) 163/99 97.7 ?F (36.5 ?C) Oral 76 18 96 % 6' (1.829 m) 95.3 kg  ?. ? ?Recent laboratory studies: No results found. ? ?Discharge Medications:   ?Allergies as of 12/28/2021   ?No Known Allergies ?  ? ?  ?Medication List  ?  ? ?TAKE these medications   ? ?acetaminophen 325 MG tablet ?Commonly known as: TYLENOL ?Take 650 mg by mouth daily. ?  ?atorvastatin 40 MG tablet ?Commonly known as: LIPITOR ?Take 40 mg by mouth at bedtime. ?  ?buPROPion 75 MG tablet ?Commonly known as: WELLBUTRIN ?Take 75 mg by mouth every 12 (twelve) hours. ?  ?cefTRIAXone  IVPB ?Commonly known as: ROCEPHIN ?Inject 1 g into the vein daily. ?  ?cholecalciferol 25 MCG (1000 UNIT) tablet ?Commonly known as: VITAMIN D ?Take 1,000 Units by mouth daily. ?  ?divalproex 125 MG DR tablet ?Commonly known as: DEPAKOTE ?Take 125 mg by mouth at bedtime. ?  ?feeding supplement (GLUCERNA SHAKE) Liqd ?Take 237 mLs by mouth 3 (three) times  daily with meals. ?  ?gabapentin 100 MG capsule ?Commonly known as: Neurontin ?Take 1 capsule (100 mg total) by mouth 3 (three) times daily. ?  ?insulin glargine 100 UNIT/ML injection ?Commonly known as: LANTUS ?Inject 0.05 mLs (5 Units total) into the skin at bedtime. ?  ?multivitamin with minerals tablet ?Take 1 tablet by mouth daily. ?  ?Nyamyc powder ?Generic drug: nystatin ?Apply 1 application. topically 2 (two) times daily. Apply to groin area for rash and itch ?  ?ondansetron 4 MG tablet ?Commonly known as: ZOFRAN ?Take 4 mg by mouth every 8 (eight) hours as needed for nausea or vomiting. ?  ?sennosides-docusate sodium 8.6-50 MG tablet ?Commonly known as: SENOKOT-S ?Take 1 tablet by mouth every evening. ?  ?sertraline 100 MG tablet ?Commonly known as: ZOLOFT ?Take 200 mg by mouth daily. ?  ?sodium chloride 0.9 % infusion ?Inject 60 mLs into the vein continuous. ?  ?Trulicity 1.5 MG/0.5ML Sopn ?Generic drug: Dulaglutide ?Inject 1.5 mg into the skin once a week. Saturday ?  ?vancomycin 750 MG Solr injection ?Commonly known as: VANCOCIN ?Inject 750 mg into the vein every 12 (twelve) hours. For osteomyelitis of right second toe for  12/21/21 23:59 ?  ? ?  ? ?  ?  ? ? ?  ?Discharge Care Instructions  ?(From admission, onward)  ?  ? ? ?  ? ?  Start     Ordered  ?  12/28/21 0000  Weight bearing as tolerated       ?Question Answer Comment  ?Laterality right   ?Extremity Lower   ?  ? 12/28/21 1117  ? 12/28/21 0000  Change dressing       ?Comments: Reinforce or change dressing as needed  ? 12/28/21 1117  ? ?  ?  ? ?  ? ? ?Diagnostic Studies: CT Head Wo Contrast ? ?Result Date: 12/02/2021 ?CLINICAL DATA:  Head trauma, minor (Age >= 65y); Neck trauma (Age >= 65y). Fall EXAM: CT HEAD WITHOUT CONTRAST CT CERVICAL SPINE WITHOUT CONTRAST TECHNIQUE: Multidetector CT imaging of the head and cervical spine was performed following the standard protocol without intravenous contrast. Multiplanar CT image reconstructions of the  cervical spine were also generated. RADIATION DOSE REDUCTION: This exam was performed according to the departmental dose-optimization program which includes automated exposure control, adjustment of the mA and/or kV according to patient size and/or use of iterative reconstruction technique. COMPARISON:  CT head 05/19/2017, CT C-spine 07/11/2006 FINDINGS: CT HEAD FINDINGS BRAIN: BRAIN Cerebral ventricle sizes are concordant with the degree of cerebral volume loss. Patchy and confluent areas of decreased attenuation are noted throughout the deep and periventricular white matter of the cerebral hemispheres bilaterally, compatible with chronic microvascular ischemic disease. No evidence of large-territorial acute infarction. No parenchymal hemorrhage. No mass lesion. No extra-axial collection. No mass effect or midline shift. No hydrocephalus. Basilar cisterns are patent. Vascular: No hyperdense vessel. Skull: No acute fracture or focal lesion. Sinuses/Orbits: Paranasal sinuses and mastoid air cells are clear. The orbits are unremarkable. Other: None. CT CERVICAL SPINE FINDINGS Alignment: Normal. Skull base and vertebrae: Interval worsening of multilevel degenerative changes of the spine most prominent at the C6-C7 level. Associated moderate osseous neural foraminal stenosis at the C6-C7 level. No associated severe osseous or neural foraminal stenosis. No acute fracture. No aggressive appearing focal osseous lesion or focal pathologic process. Soft tissues and spinal canal: No prevertebral fluid or swelling. No visible canal hematoma. Upper chest: Unremarkable. Other: Atherosclerotic plaque of the carotid arteries within the neck. IMPRESSION: 1. No acute intracranial abnormality. 2. No acute displaced fracture or traumatic listhesis of the cervical spine. Electronically Signed   By: Tish Frederickson M.D.   On: 12/02/2021 15:40  ? ?CT Cervical Spine Wo Contrast ? ?Result Date: 12/02/2021 ?CLINICAL DATA:  Head trauma, minor  (Age >= 65y); Neck trauma (Age >= 65y). Fall EXAM: CT HEAD WITHOUT CONTRAST CT CERVICAL SPINE WITHOUT CONTRAST TECHNIQUE: Multidetector CT imaging of the head and cervical spine was performed following the standard protocol without intravenous contrast. Multiplanar CT image reconstructions of the cervical spine were also generated. RADIATION DOSE REDUCTION: This exam was performed according to the departmental dose-optimization program which includes automated exposure control, adjustment of the mA and/or kV according to patient size and/or use of iterative reconstruction technique. COMPARISON:  CT head 05/19/2017, CT C-spine 07/11/2006 FINDINGS: CT HEAD FINDINGS BRAIN: BRAIN Cerebral ventricle sizes are concordant with the degree of cerebral volume loss. Patchy and confluent areas of decreased attenuation are noted throughout the deep and periventricular white matter of the cerebral hemispheres bilaterally, compatible with chronic microvascular ischemic disease. No evidence of large-territorial acute infarction. No parenchymal hemorrhage. No mass lesion. No extra-axial collection. No mass effect or midline shift. No hydrocephalus. Basilar cisterns are patent. Vascular: No hyperdense vessel. Skull: No acute fracture or focal lesion. Sinuses/Orbits: Paranasal sinuses and mastoid air cells are clear. The orbits are unremarkable. Other: None. CT CERVICAL SPINE FINDINGS Alignment: Normal. Skull  base and vertebrae: Interval worsening of multilevel degenerative changes of the spine most prominent at the C6-C7 level. Associated moderate osseous neural foraminal stenosis at the C6-C7 level. No associated severe osseous or neural foraminal stenosis. No acute fracture. No aggressive appearing focal osseous lesion or focal pathologic process. Soft tissues and spinal canal: No prevertebral fluid or swelling. No visible canal hematoma. Upper chest: Unremarkable. Other: Atherosclerotic plaque of the carotid arteries within the  neck. IMPRESSION: 1. No acute intracranial abnormality. 2. No acute displaced fracture or traumatic listhesis of the cervical spine. Electronically Signed   By: Tish FredericksonMorgane  Naveau M.D.   On: 12/02/2021 15:40

## 2021-12-28 NOTE — Anesthesia Postprocedure Evaluation (Signed)
Anesthesia Post Note ? ?Patient: Bryan Farrell ? ?Procedure(s) Performed: RIGHT 2ND TOE AMPUTATION (Right) ? ?  ? ?Patient location during evaluation: PACU ?Anesthesia Type: MAC ?Level of consciousness: awake and alert ?Pain management: pain level controlled ?Vital Signs Assessment: post-procedure vital signs reviewed and stable ?Respiratory status: spontaneous breathing, nonlabored ventilation and respiratory function stable ?Cardiovascular status: blood pressure returned to baseline and stable ?Postop Assessment: no apparent nausea or vomiting ?Anesthetic complications: no ? ? ?No notable events documented. ? ?Last Vitals:  ?Vitals:  ? 12/28/21 1125 12/28/21 1140  ?BP: (!) 157/98 (!) 156/97  ?Pulse: 77 75  ?Resp: 15 13  ?Temp:  36.4 ?C  ?SpO2: 100% 98%  ?  ?Last Pain:  ?Vitals:  ? 12/28/21 1140  ?TempSrc:   ?PainSc: 0-No pain  ? ? ?  ?  ?  ?  ?  ?  ? ?Lynda Rainwater ? ? ? ? ?

## 2021-12-28 NOTE — H&P (Signed)
Bryan Farrell is an 71 y.o. male.   ?Chief Complaint: Osteomyelitis second toe right foot ?HPI: Patient is a 71 year old gentleman who presents with ulceration osteomyelitis right foot second toe.  Patient is currently a resident at [facility] health care.  He is on IV vancomycin. ? ?Past Medical History:  ?Diagnosis Date  ? Diabetes mellitus without complication (HCC)   ? Gangrene (HCC) 05/28/2019  ? left second toe  ? Hypertension   ? ? ?Past Surgical History:  ?Procedure Laterality Date  ? AMPUTATION Left 05/30/2019  ? Procedure: LEFT FOOT 2ND RAY AMPUTATION;  Surgeon: Nadara Mustard, MD;  Location: [facility] OR;  Service: Orthopedics;  Laterality: Left;  ? TOE AMPUTATION Right   ? TONSILLECTOMY    ? ? ?Family History  ?Problem Relation Age of Onset  ? Heart Problems Mother   ? Heart Problems Father   ? ?Social History:  reports that he has never smoked. He has never used smokeless tobacco. He reports that he does not currently use alcohol. He reports that he does not use drugs. ? ?Allergies: No Known Allergies ? ?No medications prior to admission.  ? ? ?No results found for this or any previous visit (from the past 48 hour(s)). ?No results found. ? ?Review of Systems  ?All other systems reviewed and are negative. ? ?There were no vitals taken for this visit. ?Physical Exam  ?Patient is alert, oriented, no adenopathy, well-dressed, normal affect, normal respiratory effort. ?Patient's previous ankle-brachial indices 2 years ago showed normal triphasic flow.  The Doppler was used today and patient has consistent triphasic dorsalis pedis and posterior tibial flow with no clinical signs of a change.  He has exposed bone with a necrotic right second toe with sausage digit swelling status post right great toe amputation.  Hemoglobin A1c in 2020 was 9.0.  Most recent albumin is 1.8. ?Assessment/Plan ?1. Toe osteomyelitis, left (HCC)   ?  ?  ?Plan: We will plan for outpatient surgery at [facility] for an amputation of the right  second toe.  Patient will need to return to [facility] health care postoperatively. ? ?Nadara Mustard, MD ?12/28/2021, 7:02 AM ? ? ? ?

## 2021-12-28 NOTE — Anesthesia Preprocedure Evaluation (Signed)
Anesthesia Evaluation  ?Patient identified by MRN, date of birth, ID band ?Patient awake ? ? ? ?Reviewed: ?Allergy & Precautions, NPO status , Patient's Chart, lab work & pertinent test results ? ?History of Anesthesia Complications ?Negative for: history of anesthetic complications ? ?Airway ?Mallampati: II ? ?TM Distance: >3 FB ?Neck ROM: Full ? ? ? Dental ? ?(+) Poor Dentition, Chipped, Missing ?  ?Pulmonary ?neg pulmonary ROS,  ?  ?Pulmonary exam normal ? ? ? ? ? ? ? Cardiovascular ?hypertension, Pt. on medications ?Normal cardiovascular exam ? ? ?  ?Neuro/Psych ?PSYCHIATRIC DISORDERS Depression Dementia negative neurological ROS ?   ? GI/Hepatic ?negative GI ROS, Neg liver ROS,   ?Endo/Other  ?diabetes, Poorly Controlled ? Renal/GU ?Renal InsufficiencyRenal disease  ?negative genitourinary ?  ?Musculoskeletal ?negative musculoskeletal ROS ?(+)  ? Abdominal ?  ?Peds ? Hematology ?negative hematology ROS ?(+)   ?Anesthesia Other Findings ?Enterococcus bacteremia ? ?Unremarkable echo 05/29/19 ? Reproductive/Obstetrics ? ?  ? ? ? ? ? ? ? ? ? ? ? ? ? ?  ?  ? ? ? ? ? ? ? ? ?Anesthesia Physical ? ?Anesthesia Plan ? ?ASA: III ? ?Anesthesia Plan: MAC  ? ?Post-op Pain Management: Regional block* and Minimal or no pain anticipated  ? ?Induction: Intravenous ? ?PONV Risk Score and Plan: 1 and Ondansetron and Treatment may vary due to age or medical condition ? ?Airway Management Planned: Simple Face Mask ? ?Additional Equipment: None ? ?Intra-op Plan:  ? ?Post-operative Plan:  ? ?Informed Consent: I have reviewed the patients History and Physical, chart, labs and discussed the procedure including the risks, benefits and alternatives for the proposed anesthesia with the patient or authorized representative who has indicated his/her understanding and acceptance.  ? ? ? ?Dental advisory given ? ?Plan Discussed with:  ? ?Anesthesia Plan Comments:   ? ? ? ? ? ? ?Anesthesia Quick Evaluation ? ?

## 2021-12-29 ENCOUNTER — Encounter (HOSPITAL_COMMUNITY): Payer: Self-pay | Admitting: Orthopedic Surgery

## 2022-04-17 ENCOUNTER — Emergency Department (HOSPITAL_COMMUNITY): Payer: Medicare Other

## 2022-04-17 ENCOUNTER — Inpatient Hospital Stay (HOSPITAL_COMMUNITY)
Admission: EM | Admit: 2022-04-17 | Discharge: 2022-04-24 | DRG: 522 | Disposition: A | Discharge status: Skilled Nursing Facility | Payer: Medicare Other | Source: Skilled Nursing Facility | Attending: Internal Medicine | Admitting: Internal Medicine

## 2022-04-17 ENCOUNTER — Encounter (HOSPITAL_COMMUNITY): Payer: Self-pay

## 2022-04-17 ENCOUNTER — Other Ambulatory Visit: Payer: Self-pay

## 2022-04-17 DIAGNOSIS — E1142 Type 2 diabetes mellitus with diabetic polyneuropathy: Secondary | ICD-10-CM | POA: Diagnosis present

## 2022-04-17 DIAGNOSIS — E440 Moderate protein-calorie malnutrition: Secondary | ICD-10-CM | POA: Diagnosis present

## 2022-04-17 DIAGNOSIS — N179 Acute kidney failure, unspecified: Secondary | ICD-10-CM | POA: Diagnosis present

## 2022-04-17 DIAGNOSIS — M170 Bilateral primary osteoarthritis of knee: Secondary | ICD-10-CM | POA: Diagnosis present

## 2022-04-17 DIAGNOSIS — F0393 Unspecified dementia, unspecified severity, with mood disturbance: Secondary | ICD-10-CM | POA: Diagnosis present

## 2022-04-17 DIAGNOSIS — F32A Depression, unspecified: Secondary | ICD-10-CM

## 2022-04-17 DIAGNOSIS — W19XXXA Unspecified fall, initial encounter: Secondary | ICD-10-CM | POA: Diagnosis present

## 2022-04-17 DIAGNOSIS — Z794 Long term (current) use of insulin: Secondary | ICD-10-CM

## 2022-04-17 DIAGNOSIS — D62 Acute posthemorrhagic anemia: Secondary | ICD-10-CM | POA: Diagnosis not present

## 2022-04-17 DIAGNOSIS — Z79899 Other long term (current) drug therapy: Secondary | ICD-10-CM

## 2022-04-17 DIAGNOSIS — Z6823 Body mass index (BMI) 23.0-23.9, adult: Secondary | ICD-10-CM

## 2022-04-17 DIAGNOSIS — Z89421 Acquired absence of other right toe(s): Secondary | ICD-10-CM

## 2022-04-17 DIAGNOSIS — Z89432 Acquired absence of left foot: Secondary | ICD-10-CM

## 2022-04-17 DIAGNOSIS — S72011A Unspecified intracapsular fracture of right femur, initial encounter for closed fracture: Principal | ICD-10-CM | POA: Diagnosis present

## 2022-04-17 DIAGNOSIS — Y92099 Unspecified place in other non-institutional residence as the place of occurrence of the external cause: Secondary | ICD-10-CM

## 2022-04-17 DIAGNOSIS — S72001A Fracture of unspecified part of neck of right femur, initial encounter for closed fracture: Secondary | ICD-10-CM | POA: Diagnosis present

## 2022-04-17 DIAGNOSIS — Z7985 Long-term (current) use of injectable non-insulin antidiabetic drugs: Secondary | ICD-10-CM

## 2022-04-17 DIAGNOSIS — I10 Essential (primary) hypertension: Secondary | ICD-10-CM | POA: Diagnosis present

## 2022-04-17 DIAGNOSIS — E785 Hyperlipidemia, unspecified: Secondary | ICD-10-CM | POA: Diagnosis present

## 2022-04-17 DIAGNOSIS — R5381 Other malaise: Secondary | ICD-10-CM | POA: Diagnosis present

## 2022-04-17 MED ORDER — LACTATED RINGERS IV SOLN: INTRAVENOUS | Status: DC

## 2022-04-17 NOTE — ED Provider Notes (Signed)
  Provider Note MRN:  [id number]  Arrival date & time: 04/18/22    ED Course and Medical Decision Making  Assumed care from [place], T at shift change.  See note from prior team for complete details, in brief:  71 yo male Arrives from care facility Fall 3 days ago Here with hip pain MRI w/ subcapital right hip fx Prior team d/w Dr Shon Baton >>  Dr Linna Caprice to see in AM for eval CBC / BMP pending admission Plan admit to medicine  Plan per prior physician screening labs, plan admit  Labs reviewed, these are stable  Will d/w hospitalist regarding admission   Procedures  Final Clinical Impressions(s) / ED Diagnoses     ICD-10-CM   1. Closed subcapital fracture of right femur, initial encounter [facility])  S72.011A       ED Discharge Orders     None       Discharge Instructions   None         Sloan Leiter, DO 04/18/22 0940

## 2022-04-17 NOTE — ED Provider Notes (Signed)
Patient signed to me by Dr. Jacqulyn Bath pending results of right hip MRI.  Patient's MRI shows evidence of a right nondisplaced subcapital hip fracture.  Discussed with Dr. Shon Baton, on-call for orthopedics, recommends admission to medicine service.  He will see the patient consultation.   Lorre Nick, MD 04/17/22 2251

## 2022-04-17 NOTE — ED Provider Notes (Signed)
Emergency Department Provider Note   I have reviewed the triage vital signs and the nursing notes.   HISTORY  Chief Complaint Hip Injury (right)   HPI Bryan Farrell is a 71 y.o. male with past history of diabetes, hypertension currently at an assisted living presents to the emergency department with report of right hip fracture.  Patient apparently fell 3 days ago and has been complaining of pain in the right hip.  X-ray reportedly performed at the facility showing a right femoral fracture.  Patient denies pain.  He does have dementia which limits history. Does not recall the fall.    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   Hypertension     Review of Systems  Constitutional: No fever/chills Musculoskeletal: Positive right hip pain.   __________   PHYSICAL EXAM:  VITAL SIGNS: ED Triage Vitals  Enc Vitals Group     BP 04/17/22 1451 126/82     Pulse Rate 04/17/22 1451 64     Resp 04/17/22 1451 17     Temp 04/17/22 1451 97.7 F (36.5 C)     Temp Source 04/17/22 1451 Oral     SpO2 04/17/22 1451 96 %     Weight 04/17/22 1452 170 lb (77.1 kg)     Height 04/17/22 1452 6' (1.829 m)   Constitutional: Alert but confused. Well appearing and in no acute distress. Eyes: Conjunctivae are normal.  Head: Atraumatic. Nose: No congestion/rhinnorhea. Mouth/Throat: Mucous membranes are moist.   Neck: No stridor.  Cardiovascular: Normal rate, regular rhythm. Good peripheral circulation. Grossly normal heart sounds.   Respiratory: Normal respiratory effort.  No retractions. Lungs CTAB. Gastrointestinal: Soft and nontender. No distention.  Musculoskeletal: Mild pain with ROM of the right hip. No shortening or rotation. No knee or ankle tenderness.  Neurologic:  Normal speech and language. No gross focal neurologic deficits are appreciated.  Skin:  Skin is warm, dry and intact. No  rash noted.   ____________________________________________   PROCEDURES  Procedure(s) performed:   Procedures  None  ____________________________________________   INITIAL IMPRESSION / ASSESSMENT AND PLAN / ED COURSE  Pertinent labs & imaging results that were available during my care of the patient were reviewed by me and considered in my medical decision making (see chart for details).   This patient is Presenting for Evaluation of hip pain, which does require a range of treatment options, and is a complaint that involves a moderate risk of morbidity and mortality.  The Differential Diagnoses include musculoskeletal strain, occult hip fracture, dislocation, contusion, etc.  I decided to review pertinent External Data, and in summary patient with surgery of the right foot, right toe amputation secondary to osteomyelitis.   Radiologic Tests Ordered, included hip x-ray. I independently interpreted the images and agree with radiology interpretation.    Medical Decision Making: Summary:  Patient from [facility] health care center for evaluation of right hip pain and concern for right hip fracture on x-ray.  X-ray here does not show fracture or other acute abnormality.  He does seem to have some tenderness with movement of the right hip and do plan for MRI here for further evaluation possible occult fracture. Care transferred to Dr. Freida Busman.   Disposition: pending   ____________________________________________  FINAL CLINICAL IMPRESSION(S) / ED DIAGNOSES  Final diagnoses:  Closed subcapital fracture of right femur, initial encounter (HCC)     Note:  This document was prepared using Dragon voice recognition software and may include unintentional dictation errors.  Alona Bene, MD, [facility]    Nyquan Selbe, Arlyss Repress, MD 04/20/22 [phone]

## 2022-04-17 NOTE — ED Triage Notes (Signed)
Patient Bryan Farrell from [facility] Patient had fall on Friday 04/14/22 Increasing pain in right hip Xray today at facility showed right femoral head fx.  Patient rates pain in triage is rated as "almost nothing" - says that he is fine when he is lying still Ems states facility says patient has dementia but is a/o x 3.

## 2022-04-18 ENCOUNTER — Other Ambulatory Visit: Payer: Self-pay

## 2022-04-18 DIAGNOSIS — I129 Hypertensive chronic kidney disease with stage 1 through stage 4 chronic kidney disease, or unspecified chronic kidney disease: Secondary | ICD-10-CM | POA: Diagnosis not present

## 2022-04-18 DIAGNOSIS — W19XXXA Unspecified fall, initial encounter: Secondary | ICD-10-CM | POA: Diagnosis present

## 2022-04-18 DIAGNOSIS — N179 Acute kidney failure, unspecified: Secondary | ICD-10-CM | POA: Diagnosis present

## 2022-04-18 DIAGNOSIS — F32A Depression, unspecified: Secondary | ICD-10-CM

## 2022-04-18 DIAGNOSIS — Z7985 Long-term (current) use of injectable non-insulin antidiabetic drugs: Secondary | ICD-10-CM | POA: Diagnosis not present

## 2022-04-18 DIAGNOSIS — N189 Chronic kidney disease, unspecified: Secondary | ICD-10-CM | POA: Diagnosis not present

## 2022-04-18 DIAGNOSIS — E785 Hyperlipidemia, unspecified: Secondary | ICD-10-CM | POA: Diagnosis present

## 2022-04-18 DIAGNOSIS — E1142 Type 2 diabetes mellitus with diabetic polyneuropathy: Secondary | ICD-10-CM

## 2022-04-18 DIAGNOSIS — Z6823 Body mass index (BMI) 23.0-23.9, adult: Secondary | ICD-10-CM | POA: Diagnosis not present

## 2022-04-18 DIAGNOSIS — Y92099 Unspecified place in other non-institutional residence as the place of occurrence of the external cause: Secondary | ICD-10-CM | POA: Diagnosis not present

## 2022-04-18 DIAGNOSIS — S72001A Fracture of unspecified part of neck of right femur, initial encounter for closed fracture: Secondary | ICD-10-CM | POA: Diagnosis present

## 2022-04-18 DIAGNOSIS — E440 Moderate protein-calorie malnutrition: Secondary | ICD-10-CM | POA: Diagnosis present

## 2022-04-18 DIAGNOSIS — D62 Acute posthemorrhagic anemia: Secondary | ICD-10-CM | POA: Diagnosis not present

## 2022-04-18 DIAGNOSIS — Z794 Long term (current) use of insulin: Secondary | ICD-10-CM | POA: Diagnosis not present

## 2022-04-18 DIAGNOSIS — F0393 Unspecified dementia, unspecified severity, with mood disturbance: Secondary | ICD-10-CM | POA: Diagnosis present

## 2022-04-18 DIAGNOSIS — M170 Bilateral primary osteoarthritis of knee: Secondary | ICD-10-CM | POA: Diagnosis present

## 2022-04-18 DIAGNOSIS — Z89432 Acquired absence of left foot: Secondary | ICD-10-CM | POA: Diagnosis not present

## 2022-04-18 DIAGNOSIS — I739 Peripheral vascular disease, unspecified: Secondary | ICD-10-CM | POA: Diagnosis not present

## 2022-04-18 DIAGNOSIS — Z89421 Acquired absence of other right toe(s): Secondary | ICD-10-CM | POA: Diagnosis not present

## 2022-04-18 DIAGNOSIS — E1122 Type 2 diabetes mellitus with diabetic chronic kidney disease: Secondary | ICD-10-CM | POA: Diagnosis not present

## 2022-04-18 DIAGNOSIS — R5381 Other malaise: Secondary | ICD-10-CM | POA: Diagnosis present

## 2022-04-18 DIAGNOSIS — I10 Essential (primary) hypertension: Secondary | ICD-10-CM | POA: Diagnosis present

## 2022-04-18 DIAGNOSIS — S72011A Unspecified intracapsular fracture of right femur, initial encounter for closed fracture: Secondary | ICD-10-CM | POA: Diagnosis present

## 2022-04-18 DIAGNOSIS — Z79899 Other long term (current) drug therapy: Secondary | ICD-10-CM | POA: Diagnosis not present

## 2022-04-18 LAB — BASIC METABOLIC PANEL
Anion gap: 7 (ref 5–15)
Anion gap: 8 (ref 5–15)
BUN: 20 mg/dL (ref 8–23)
BUN: 21 mg/dL (ref 8–23)
CO2: 27 mmol/L (ref 22–32)
CO2: 27 mmol/L (ref 22–32)
Calcium: 8.8 mg/dL — ABNORMAL LOW (ref 8.9–10.3)
Calcium: 8.9 mg/dL (ref 8.9–10.3)
Chloride: 105 mmol/L (ref 98–111)
Chloride: 106 mmol/L (ref 98–111)
Creatinine, Ser: 1.16 mg/dL (ref 0.61–1.24)
Creatinine, Ser: 1.18 mg/dL (ref 0.61–1.24)
GFR, Estimated: >60 mL/min (ref >60)
GFR, Estimated: >60 mL/min (ref >60)
Glucose, Bld: 110 mg/dL — ABNORMAL HIGH (ref 70–99)
Glucose, Bld: 110 mg/dL — ABNORMAL HIGH (ref 70–99)
Potassium: 4.3 mmol/L (ref 3.5–5.1)
Potassium: 4.3 mmol/L (ref 3.5–5.1)
Sodium: 140 mmol/L (ref 135–145)
Sodium: 140 mmol/L (ref 135–145)

## 2022-04-18 LAB — CBC WITH DIFFERENTIAL/PLATELET
Abs Immature Granulocytes: 0.03 10*3/uL (ref 0.00–0.07)
Basophils Absolute: 0.1 10*3/uL (ref 0.0–0.1)
Basophils Relative: 1 %
Eosinophils Absolute: 0.6 10*3/uL — ABNORMAL HIGH (ref 0.0–0.5)
Eosinophils Relative: 7 %
HCT: 32.6 % — ABNORMAL LOW (ref 39.0–52.0)
Hemoglobin: 10.8 g/dL — ABNORMAL LOW (ref 13.0–17.0)
Immature Granulocytes: 0 %
Lymphocytes Relative: 22 %
Lymphs Abs: 2 10*3/uL (ref 0.7–4.0)
MCH: 30.5 pg (ref 26.0–34.0)
MCHC: 33.1 g/dL (ref 30.0–36.0)
MCV: 92.1 fL (ref 80.0–100.0)
Monocytes Absolute: 0.7 10*3/uL (ref 0.1–1.0)
Monocytes Relative: 7 %
Neutro Abs: 5.9 10*3/uL (ref 1.7–7.7)
Neutrophils Relative %: 63 %
Platelets: 312 10*3/uL (ref 150–400)
RBC: 3.54 MIL/uL — ABNORMAL LOW (ref 4.22–5.81)
RDW: 14.4 % (ref 11.5–15.5)
WBC: 9.4 10*3/uL (ref 4.0–10.5)
nRBC: 0 % (ref 0.0–0.2)

## 2022-04-18 LAB — HEMOGLOBIN A1C
Hgb A1c MFr Bld: 6.7 % — ABNORMAL HIGH (ref 4.8–5.6)
Mean Plasma Glucose: 145.59 mg/dL

## 2022-04-18 LAB — GLUCOSE, CAPILLARY
Glucose-Capillary: 91 mg/dL (ref 70–99)
Glucose-Capillary: 94 mg/dL (ref 70–99)
Glucose-Capillary: 108 mg/dL — ABNORMAL HIGH (ref 70–99)
Glucose-Capillary: 129 mg/dL — ABNORMAL HIGH (ref 70–99)

## 2022-04-18 LAB — CBC
HCT: 32.1 % — ABNORMAL LOW (ref 39.0–52.0)
Hemoglobin: 10.5 g/dL — ABNORMAL LOW (ref 13.0–17.0)
MCH: 30.1 pg (ref 26.0–34.0)
MCHC: 32.7 g/dL (ref 30.0–36.0)
MCV: 92 fL (ref 80.0–100.0)
Platelets: 282 10*3/uL (ref 150–400)
RBC: 3.49 MIL/uL — ABNORMAL LOW (ref 4.22–5.81)
RDW: 14.4 % (ref 11.5–15.5)
WBC: 10 10*3/uL (ref 4.0–10.5)
nRBC: 0.2 % (ref 0.0–0.2)

## 2022-04-18 MED ORDER — POLYETHYLENE GLYCOL 3350 17 GM/SCOOP PO POWD
17.0000 g | Freq: Two times a day (BID) | ORAL | Status: DC
  Filled 2022-04-18: qty 255

## 2022-04-18 MED ORDER — POLYETHYLENE GLYCOL 3350 17 G PO PACK
17.0000 g | PACK | Freq: Two times a day (BID) | ORAL | Status: DC
  Administered 2022-04-18 – 2022-04-24 (×9): 17 g via ORAL
  Filled 2022-04-18 (×10): qty 1

## 2022-04-18 MED ORDER — ONDANSETRON HCL 4 MG/2ML IJ SOLN: 4.0000 mg | INTRAMUSCULAR | Status: DC | PRN

## 2022-04-18 MED ORDER — SENNOSIDES-DOCUSATE SODIUM 8.6-50 MG PO TABS
1.0000 | ORAL_TABLET | Freq: Every day | ORAL | Status: DC
  Administered 2022-04-19: 1 via ORAL
  Filled 2022-04-18: qty 1

## 2022-04-18 MED ORDER — ADULT MULTIVITAMIN W/MINERALS CH
1.0000 | ORAL_TABLET | Freq: Every day | ORAL | Status: DC
  Administered 2022-04-19 – 2022-04-24 (×5): 1 via ORAL
  Filled 2022-04-18 (×5): qty 1

## 2022-04-18 MED ORDER — GABAPENTIN 100 MG PO CAPS
100.0000 mg | ORAL_CAPSULE | Freq: Three times a day (TID) | ORAL | Status: DC
  Administered 2022-04-18 – 2022-04-24 (×13): 100 mg via ORAL
  Filled 2022-04-18 (×14): qty 1

## 2022-04-18 MED ORDER — HYDROCODONE-ACETAMINOPHEN 5-325 MG PO TABS
1.0000 | ORAL_TABLET | Freq: Four times a day (QID) | ORAL | Status: DC | PRN
  Administered 2022-04-19 – 2022-04-22 (×5): 2 via ORAL
  Administered 2022-04-23 – 2022-04-24 (×2): 1 via ORAL
  Administered 2022-04-24: 2 via ORAL
  Administered 2022-04-24: 1 via ORAL
  Filled 2022-04-18 (×3): qty 2
  Filled 2022-04-18: qty 1
  Filled 2022-04-18 (×3): qty 2
  Filled 2022-04-18 (×2): qty 1
  Filled 2022-04-18: qty 2

## 2022-04-18 MED ORDER — ACETAMINOPHEN 325 MG PO TABS
650.0000 mg | ORAL_TABLET | ORAL | Status: DC | PRN
  Administered 2022-04-18: 650 mg via ORAL
  Filled 2022-04-18: qty 2

## 2022-04-18 MED ORDER — ATORVASTATIN CALCIUM 40 MG PO TABS
40.0000 mg | ORAL_TABLET | Freq: Every day | ORAL | Status: DC
  Administered 2022-04-18 – 2022-04-21 (×4): 40 mg via ORAL
  Filled 2022-04-18 (×5): qty 1

## 2022-04-18 MED ORDER — TRAZODONE HCL 50 MG PO TABS: 25.0000 mg | ORAL_TABLET | Freq: Every evening | ORAL | Status: DC | PRN

## 2022-04-18 MED ORDER — MORPHINE SULFATE (PF) 2 MG/ML IV SOLN: 0.5000 mg | INTRAVENOUS | Status: DC | PRN

## 2022-04-18 MED ORDER — INSULIN GLARGINE-YFGN 100 UNIT/ML ~~LOC~~ SOLN
5.0000 [IU] | Freq: Every day | SUBCUTANEOUS | Status: DC
  Administered 2022-04-18 – 2022-04-23 (×6): 5 [IU] via SUBCUTANEOUS
  Filled 2022-04-18 (×8): qty 0.05

## 2022-04-18 MED ORDER — SERTRALINE HCL 100 MG PO TABS
200.0000 mg | ORAL_TABLET | Freq: Every day | ORAL | Status: DC
  Administered 2022-04-18 – 2022-04-24 (×6): 200 mg via ORAL
  Filled 2022-04-18 (×3): qty 2
  Filled 2022-04-18 (×2): qty 4
  Filled 2022-04-18: qty 2

## 2022-04-18 MED ORDER — MAGNESIUM HYDROXIDE 400 MG/5ML PO SUSP: 30.0000 mL | Freq: Every day | ORAL | Status: DC | PRN

## 2022-04-18 MED ORDER — INSULIN ASPART 100 UNIT/ML IJ SOLN
0.0000 [IU] | Freq: Three times a day (TID) | INTRAMUSCULAR | Status: DC
  Administered 2022-04-19 (×2): 2 [IU] via SUBCUTANEOUS
  Administered 2022-04-21: 1 [IU] via SUBCUTANEOUS
  Administered 2022-04-21: 2 [IU] via SUBCUTANEOUS
  Administered 2022-04-22: 1 [IU] via SUBCUTANEOUS
  Administered 2022-04-24: 2 [IU] via SUBCUTANEOUS

## 2022-04-18 MED ORDER — VITAMIN D 25 MCG (1000 UNIT) PO TABS
1000.0000 [IU] | ORAL_TABLET | Freq: Every day | ORAL | Status: DC
  Administered 2022-04-19 – 2022-04-24 (×5): 1000 [IU] via ORAL
  Filled 2022-04-18 (×5): qty 1

## 2022-04-18 MED ORDER — SCOPOLAMINE 1 MG/3DAYS TD PT72: 1.0000 | MEDICATED_PATCH | TRANSDERMAL | Status: DC | PRN

## 2022-04-18 MED ORDER — LIDOCAINE 5 % EX PTCH
1.0000 | MEDICATED_PATCH | CUTANEOUS | Status: DC
  Administered 2022-04-18 – 2022-04-24 (×5): 1 via TRANSDERMAL
  Filled 2022-04-18 (×7): qty 1

## 2022-04-18 MED ORDER — SODIUM CHLORIDE 0.9 % IV SOLN: INTRAVENOUS | Status: DC

## 2022-04-18 MED ORDER — CHLORHEXIDINE GLUCONATE 0.12 % MT SOLN
15.0000 mL | Freq: Every morning | OROMUCOSAL | Status: DC
Start: 2022-04-18 — End: 2022-04-20
  Administered 2022-04-18 – 2022-04-20 (×3): 15 mL via OROMUCOSAL
  Filled 2022-04-18: qty 15

## 2022-04-18 MED ORDER — DIVALPROEX SODIUM 125 MG PO DR TAB
125.0000 mg | DELAYED_RELEASE_TABLET | Freq: Every day | ORAL | Status: DC
  Administered 2022-04-18 – 2022-04-21 (×4): 125 mg via ORAL
  Filled 2022-04-18 (×7): qty 1

## 2022-04-18 MED ORDER — BUPROPION HCL 100 MG PO TABS
100.0000 mg | ORAL_TABLET | Freq: Two times a day (BID) | ORAL | Status: DC
  Administered 2022-04-18 – 2022-04-24 (×10): 100 mg via ORAL
  Filled 2022-04-18 (×14): qty 1

## 2022-04-18 MED ORDER — INSULIN ASPART 100 UNIT/ML IJ SOLN
0.0000 [IU] | Freq: Every day | INTRAMUSCULAR | Status: DC
  Administered 2022-04-20: 3 [IU] via SUBCUTANEOUS

## 2022-04-18 MED ORDER — GLUCERNA SHAKE PO LIQD
237.0000 mL | Freq: Three times a day (TID) | ORAL | Status: DC
  Administered 2022-04-18 – 2022-04-24 (×14): 237 mL via ORAL
  Filled 2022-04-18 (×22): qty 237

## 2022-04-18 MED ORDER — ONDANSETRON HCL 4 MG PO TABS: 4.0000 mg | ORAL_TABLET | Freq: Three times a day (TID) | ORAL | Status: DC | PRN

## 2022-04-18 MED ORDER — SENNOSIDES-DOCUSATE SODIUM 8.6-50 MG PO TABS
2.0000 | ORAL_TABLET | Freq: Every day | ORAL | Status: DC
  Administered 2022-04-18 – 2022-04-19 (×2): 2 via ORAL
  Filled 2022-04-18 (×2): qty 2

## 2022-04-18 NOTE — Plan of Care (Signed)
  Problem: Pain Managment: Goal: General experience of comfort will improve 04/18/2022 0858 by Beverly Sessions, RN Outcome: Progressing   Problem: Safety: Goal: Ability to remain free from injury will improve 04/18/2022 0858 by Beverly Sessions, RN Outcome: Progressing 04/18/2022 0857 by Beverly Sessions, RN Outcome: Progressing   Problem: Activity: Goal: Risk for activity intolerance will decrease 04/18/2022 0858 by Beverly Sessions, RN Outcome: Progressing 04/18/2022 0857 by Beverly Sessions, RN Outcome: Progressing

## 2022-04-18 NOTE — ED Notes (Signed)
Assumed care of patient.

## 2022-04-18 NOTE — Plan of Care (Signed)
POA initiated 

## 2022-04-18 NOTE — Plan of Care (Signed)
  Problem: Education: Goal: Knowledge of General Education information will improve Description: Including pain rating scale, medication(s)/side effects and non-pharmacologic comfort measures Outcome: Progressing   Problem: Activity: Goal: Risk for activity intolerance will decrease Outcome: Progressing   Problem: Pain Managment: Goal: General experience of comfort will improve Outcome: Progressing   

## 2022-04-18 NOTE — Progress Notes (Signed)
Report received from [facility] RN/ED. Pt arrived to 1336 and transferred to bed. Upon assessment pt. Found to have large soft stool and with cleaning hard dry stool found around Pt's anus.  Area cleaned with soap and water,  dried and pt states that he feels better.

## 2022-04-18 NOTE — H&P (View-Only) (Signed)
History:Bryan Farrell is a 72 y.o. Caucasian male with medical history significant for dementia, type 2 diabetes mellitus, and hypertension, who presented to the emergency room with acute onset of right hip pain.  The patient fell 3 days ago at his assisted living facility and has been complaining of right hip pain.  X-ray reportedly performed at the facility showed right femoral fracture.  He did not recall any head injuries and is unsure if he passed out or not.  No witnessed seizures.  He denies any paresthesias or focal muscle weakness.    No chest pain or palpitations.  No cough or wheezing or dyspnea.  No dysuria, oliguria, hematuria, urgency or frequency or flank pain.  No fever or chills.  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   Current Outpatient Medications on File Prior to Encounter  Medication Sig Dispense Refill   acetaminophen (TYLENOL) 325 MG tablet Take 650 mg by mouth in the morning.     atorvastatin (LIPITOR) 40 MG tablet Take 40 mg by mouth at bedtime.     buPROPion (WELLBUTRIN) 100 MG tablet Take 100 mg by mouth in the morning and at bedtime.     chlorhexidine (PERIDEX) 0.12 % solution 15 mLs by Mouth Rinse route in the morning.     cholecalciferol (VITAMIN D) 25 MCG (1000 UNIT) tablet Take 1,000 Units by mouth daily.     divalproex (DEPAKOTE) 125 MG DR tablet Take 125 mg by mouth at bedtime.     feeding supplement, GLUCERNA SHAKE, (GLUCERNA SHAKE) LIQD Take 237 mLs by mouth 3 (three) times daily after meals.     gabapentin (NEURONTIN) 100 MG capsule Take 1 capsule (100 mg total) by mouth 3 (three) times daily. 30 capsule 0   insulin glargine (LANTUS) 100 UNIT/ML injection Inject 0.05 mLs (5 Units total) into the skin at bedtime. 10 mL 11   lidocaine  4 % Place 1 patch onto the skin daily.     Multiple Vitamins-Minerals (MULTIVITAMIN WITH MINERALS) tablet Take 1 tablet by mouth daily.     ondansetron (ZOFRAN) 4 MG tablet Take 4 mg by mouth every 8 (eight) hours as needed for nausea or vomiting.     polyethylene glycol powder (GLYCOLAX/MIRALAX) 17 GM/SCOOP powder Take 17 g by mouth in the morning and at bedtime.     scopolamine (TRANSDERM-SCOP) 1 MG/3DAYS Place 1 patch onto the skin every three (3) days as needed (for motion sickness with emesis- "apply 4 hours before OOB with therapy").     sennosides-docusate sodium (SENOKOT-S) 8.6-50 MG tablet Take 1-2 tablets by mouth See admin instructions. Take 1 tablet by mouth in the morning and 2 tablets at 5 PM daily     sertraline (ZOLOFT) 100 MG tablet Take 200 mg by mouth daily.     TRULICITY 1.5 MG/0.5ML SOPN Inject 1.5 mg into the skin every Saturday.      Physical Exam: Vitals:   04/18/22 0319 04/18/22 0344  BP:  (!) 150/93  Pulse:  71  Resp:  16  Temp: 97.6 F (36.4 C) 98.2 F (36.8 C)  SpO2:  96%   Body mass index is 23.06 kg/m. GENERAL:  72 y.o.-year-old Caucasian male patient lying in the bed with no acute distress.  EYES:  Pupils equal, round, reactive to light and accommodation. No scleral icterus. Extraocular muscles intact.  HEENT: Head atraumatic, normocephalic. Oropharynx and nasopharynx clear.  NECK:  Supple, no jugular venous distention. No thyroid enlargement, no tenderness.  LUNGS: Normal breath sounds bilaterally, no wheezing, rales,rhonchi or crepitation. No use of accessory muscles of respiration.  CARDIOVASCULAR: Regular rate and rhythm, S1, S2 normal. No murmurs, rubs, or gallops.  ABDOMEN: Soft, nondistended, nontender. Bowel sounds present. No organomegaly or mass.  EXTREMITIES: No pedal edema, cyanosis, or clubbing. Musculoskeletal: Right lateral hip tenderness. NEUROLOGIC: Cranial nerves II through XII are intact. Muscle strength 5/5 in all extremities.  Sensation intact. Gait not checked.  PSYCHIATRIC: The patient is alert and oriented x 3.  Normal affect and good eye contact. SKIN: No obvious rash, lesion, or ulcer.   Image: MR HIP RIGHT WO CONTRAST  Result Date: 04/18/2022 CLINICAL DATA:  Fell 4 days ago.  Right hip pain. EXAM: MR OF THE RIGHT HIP WITHOUT CONTRAST TECHNIQUE: Multiplanar, multisequence MR imaging was performed. No intravenous contrast was administered. COMPARISON:  Radiographs 04/17/2022 FINDINGS: There is a mildly impacted subcapital fracture of the right hip. Moderate surrounding marrow edema. Small associated joint effusion. The left hip is intact. The pubic symphysis and SI joints are intact. No pelvic fractures or bone lesions. The hip and pelvic musculature is intact. No muscle tear or myositis. Mild fatty atrophy of the hip and pelvic musculature. No significant intrapelvic abnormalities are identified. IMPRESSION: 1. Mildly impacted subcapital fracture of the right hip. 2. Intact bony pelvis. 3. No significant intrapelvic abnormalities. Electronically Signed   By: P.  Gallerani M.D.   On: 04/18/2022 07:37   DG Chest Port 1 View  Result Date: 04/17/2022 CLINICAL DATA:  Preop, hip fracture. EXAM: PORTABLE CHEST 1 VIEW COMPARISON:  08/25/2021 FINDINGS: Low lung volumes.The cardiomediastinal contours are normal. Multiple skin folds project over the left hemithorax. Pulmonary vasculature is normal. No consolidation, pleural effusion, or pneumothorax. No acute osseous abnormalities are seen. IMPRESSION: 1. Low lung volumes without acute abnormality. 2. Multiple skin folds project over the left hemithorax. Electronically Signed   By: Melanie  Sanford M.D.   On: 04/17/2022 23:57   DG Knee 2 Views Right  Result Date: 04/17/2022 CLINICAL DATA:  knee pain. Pt c/o right knee pain after having a right hip injury, Unable to hold his leg straight for images EXAM: RIGHT KNEE - 1-2 VIEW COMPARISON:  None Available. FINDINGS: No evidence of  fracture, dislocation, or joint effusion. No evidence of arthropathy or other focal bone abnormality. Soft tissues are unremarkable. Vascular calcification. IMPRESSION: Negative. Electronically Signed   By: Morgane  Naveau M.D.   On: 04/17/2022 17:21   DG Hip Unilat W or Wo Pelvis 2-3 Views Right  Result Date: 04/17/2022 CLINICAL DATA:  Right hip pain after a fall 3 days ago. EXAM: DG HIP (WITH OR WITHOUT PELVIS) 2-3V RIGHT COMPARISON:  None Available. FINDINGS: No fracture.  No bone lesion. Mild narrowing of the superolateral right hip joint space. Small marginal osteophytes from the base of the right femoral head. Left hip joint normally spaced and aligned. SI joints and symphysis pubis normally spaced and aligned. Bilateral iliac and femoral artery vascular calcifications. IMPRESSION: 1. No fracture or acute finding. 2. Mild right hip joint osteoarthritis. Electronically Signed   By: David  Ormond M.D.   On: 04/17/2022 15:47    A/P: Bryan Farrell is a pleasant 62-year-old gentleman with a history of dementia and type 2 diabetes who presented to the emergency room   with right hip pain for 3 days.  Patient was seen prior and hip fracture was not appreciated.  He returns today with ongoing difficulty ambulating and progressive pain.  Imaging studies demonstrated a right hip fracture and so he is admitted for definitive fixation.  I have spoken with my partner Dr. Linna Caprice and he will plan on surgical management of the hip fracture on Thursday.  Patient should be made n.p.o. after midnight tonight with a plan on ORIF Thursday.

## 2022-04-18 NOTE — Consult Note (Signed)
History:Bryan Farrell is a 71 y.o. Caucasian male with medical history significant for dementia, type 2 diabetes mellitus, and hypertension, who presented to the emergency room with acute onset of right hip pain.  The patient fell 3 days ago at his assisted living facility and has been complaining of right hip pain.  X-ray reportedly performed at the facility showed right femoral fracture.  He did not recall any head injuries and is unsure if he passed out or not.  No witnessed seizures.  He denies any paresthesias or focal muscle weakness.    No chest pain or palpitations.  No cough or wheezing or dyspnea.  No dysuria, oliguria, hematuria, urgency or frequency or flank pain.  No fever or chills.  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   Current Outpatient Medications on File Prior to Encounter  Medication Sig Dispense Refill   acetaminophen (TYLENOL) 325 MG tablet Take 650 mg by mouth in the morning.     atorvastatin (LIPITOR) 40 MG tablet Take 40 mg by mouth at bedtime.     buPROPion (WELLBUTRIN) 100 MG tablet Take 100 mg by mouth in the morning and at bedtime.     chlorhexidine (PERIDEX) 0.12 % solution 15 mLs by Mouth Rinse route in the morning.     cholecalciferol (VITAMIN D) 25 MCG (1000 UNIT) tablet Take 1,000 Units by mouth daily.     divalproex (DEPAKOTE) 125 MG DR tablet Take 125 mg by mouth at bedtime.     feeding supplement, GLUCERNA SHAKE, (GLUCERNA SHAKE) LIQD Take 237 mLs by mouth 3 (three) times daily after meals.     gabapentin (NEURONTIN) 100 MG capsule Take 1 capsule (100 mg total) by mouth 3 (three) times daily. 30 capsule 0   insulin glargine (LANTUS) 100 UNIT/ML injection Inject 0.05 mLs (5 Units total) into the skin at bedtime. 10 mL 11   lidocaine  4 % Place 1 patch onto the skin daily.     Multiple Vitamins-Minerals (MULTIVITAMIN WITH MINERALS) tablet Take 1 tablet by mouth daily.     ondansetron (ZOFRAN) 4 MG tablet Take 4 mg by mouth every 8 (eight) hours as needed for nausea or vomiting.     polyethylene glycol powder (GLYCOLAX/MIRALAX) 17 GM/SCOOP powder Take 17 g by mouth in the morning and at bedtime.     scopolamine (TRANSDERM-SCOP) 1 MG/3DAYS Place 1 patch onto the skin every three (3) days as needed (for motion sickness with emesis- "apply 4 hours before OOB with therapy").     sennosides-docusate sodium (SENOKOT-S) 8.6-50 MG tablet Take 1-2 tablets by mouth See admin instructions. Take 1 tablet by mouth in the morning and 2 tablets at 5 PM daily     sertraline (ZOLOFT) 100 MG tablet Take 200 mg by mouth daily.     TRULICITY 1.5 MG/0.5ML SOPN Inject 1.5 mg into the skin every Saturday.      Physical Exam: Vitals:   04/18/22 0319 04/18/22 0344  BP:  (!) 150/93  Pulse:  71  Resp:  16  Temp: 97.6 F (36.4 C) 98.2 F (36.8 C)  SpO2:  96%   Body mass index is 23.06 kg/m. GENERAL:  71 y.o.-year-old Caucasian male patient lying in the bed with no acute distress.  EYES:  Pupils equal, round, reactive to light and accommodation. No scleral icterus. Extraocular muscles intact.  HEENT: Head atraumatic, normocephalic. Oropharynx and nasopharynx clear.  NECK:  Supple, no jugular venous distention. No thyroid enlargement, no tenderness.  LUNGS: Normal breath sounds bilaterally, no wheezing, rales,rhonchi or crepitation. No use of accessory muscles of respiration.  CARDIOVASCULAR: Regular rate and rhythm, S1, S2 normal. No murmurs, rubs, or gallops.  ABDOMEN: Soft, nondistended, nontender. Bowel sounds present. No organomegaly or mass.  EXTREMITIES: No pedal edema, cyanosis, or clubbing. Musculoskeletal: Right lateral hip tenderness. NEUROLOGIC: Cranial nerves II through XII are intact. Muscle strength 5/5 in all extremities.  Sensation intact. Gait not checked.  PSYCHIATRIC: The patient is alert and oriented x 3.  Normal affect and good eye contact. SKIN: No obvious rash, lesion, or ulcer.   Image: MR HIP RIGHT WO CONTRAST  Result Date: 04/18/2022 CLINICAL DATA:  [place] 4 days ago.  Right hip pain. EXAM: MR OF THE RIGHT HIP WITHOUT CONTRAST TECHNIQUE: Multiplanar, multisequence MR imaging was performed. No intravenous contrast was administered. COMPARISON:  Radiographs 04/17/2022 FINDINGS: There is a mildly impacted subcapital fracture of the right hip. Moderate surrounding marrow edema. Small associated joint effusion. The left hip is intact. The pubic symphysis and SI joints are intact. No pelvic fractures or bone lesions. The hip and pelvic musculature is intact. No muscle tear or myositis. Mild fatty atrophy of the hip and pelvic musculature. No significant intrapelvic abnormalities are identified. IMPRESSION: 1. Mildly impacted subcapital fracture of the right hip. 2. Intact bony pelvis. 3. No significant intrapelvic abnormalities. Electronically Signed   By: Marijo Sanes M.D.   On: 04/18/2022 07:37   DG Chest Port 1 View  Result Date: 04/17/2022 CLINICAL DATA:  Preop, hip fracture. EXAM: PORTABLE CHEST 1 VIEW COMPARISON:  08/25/2021 FINDINGS: Low lung volumes.The cardiomediastinal contours are normal. Multiple skin folds project over the left hemithorax. Pulmonary vasculature is normal. No consolidation, pleural effusion, or pneumothorax. No acute osseous abnormalities are seen. IMPRESSION: 1. Low lung volumes without acute abnormality. 2. Multiple skin folds project over the left hemithorax. Electronically Signed   By: Keith Rake M.D.   On: 04/17/2022 23:57   DG Knee 2 Views Right  Result Date: 04/17/2022 CLINICAL DATA:  knee pain. Pt c/o right knee pain after having a right hip injury, Unable to hold his leg straight for images EXAM: RIGHT KNEE - 1-2 VIEW COMPARISON:  None Available. FINDINGS: No evidence of  fracture, dislocation, or joint effusion. No evidence of arthropathy or other focal bone abnormality. Soft tissues are unremarkable. Vascular calcification. IMPRESSION: Negative. Electronically Signed   By: Iven Finn M.D.   On: 04/17/2022 17:21   DG Hip Unilat W or Wo Pelvis 2-3 Views Right  Result Date: 04/17/2022 CLINICAL DATA:  Right hip pain after a fall 3 days ago. EXAM: DG HIP (WITH OR WITHOUT PELVIS) 2-3V RIGHT COMPARISON:  None Available. FINDINGS: No fracture.  No bone lesion. Mild narrowing of the superolateral right hip joint space. Small marginal osteophytes from the base of the right femoral head. Left hip joint normally spaced and aligned. SI joints and symphysis pubis normally spaced and aligned. Bilateral iliac and femoral artery vascular calcifications. IMPRESSION: 1. No fracture or acute finding. 2. Mild right hip joint osteoarthritis. Electronically Signed   By: Lajean Manes M.D.   On: 04/17/2022 15:47    A/P: Braxtynn is a pleasant 71 year old gentleman with a history of dementia and type 2 diabetes who presented to the emergency room  with right hip pain for 3 days.  Patient was seen prior and hip fracture was not appreciated.  He returns today with ongoing difficulty ambulating and progressive pain.  Imaging studies demonstrated a right hip fracture and so he is admitted for definitive fixation.  I have spoken with my partner Dr. Linna Caprice and he will plan on surgical management of the hip fracture on Thursday.  Patient should be made n.p.o. after midnight tonight with a plan on ORIF Thursday.

## 2022-04-18 NOTE — ED Notes (Signed)
Report given to Linda, RN.

## 2022-04-18 NOTE — Assessment & Plan Note (Addendum)
-   We will continue Wellbutrin and Zoloft as well as Depakote DR.

## 2022-04-18 NOTE — ED Notes (Signed)
Patient is resting in bed. Given a warm blanket and urinal was emptied.

## 2022-04-18 NOTE — Assessment & Plan Note (Signed)
-   The patient will be placed on supplemental coverage with subcutaneous NovoLog. - We will continue his basal coverage. - We will continue Neurontin.

## 2022-04-18 NOTE — Plan of Care (Signed)
Patient seen and rounded on this morning.  H&P written same day. Patient presented after a fall a few days prior to admission at his assisted living facility.  Fall appears mechanical in nature. He ultimately underwent right hip MRI which showed a mildly impacted subcapital fracture of the right hip. He was evaluated by orthopedic surgery after admission with plans for ORIF on Thursday, 04/20/2022. No major changes to plan from admission. Continue currently ordered medications. Diet started this morning. Patient will need to be n.p.o. Wednesday at midnight. Continue pain control.  Plan: - d/c IVF - NPO Wed at [facility] - start regular diet   Lewie Chamber, MD [facility] 04/18/2022, 1:04 PM

## 2022-04-18 NOTE — Assessment & Plan Note (Signed)
-   We will continue Statin therapy 

## 2022-04-18 NOTE — ED Notes (Signed)
Patient is resting in bed comfortably. No  needs at this time.

## 2022-04-18 NOTE — Progress Notes (Signed)
Initial Nutrition Assessment  DOCUMENTATION CODES:   Non-severe (moderate) malnutrition in context of chronic illness  INTERVENTION:   -Glucerna Shake po TID, each supplement provides 220 kcal and 10 grams of protein   NUTRITION DIAGNOSIS:   Moderate Malnutrition related to chronic illness (dementia) as evidenced by percent weight loss, moderate fat depletion, moderate muscle depletion.  GOAL:   Patient will meet greater than or equal to 90% of their needs  MONITOR:   PO intake, Supplement acceptance, Labs, Weight trends, I & O's  REASON FOR ASSESSMENT:   Consult Hip fracture protocol  ASSESSMENT:   71 y.o. Caucasian male with medical history significant for dementia, type 2 diabetes mellitus, and hypertension, who presented to the emergency room with acute onset of right hip pain.  Patient in room, diet just advanced to regular. Pt states he has been eating. Does not eat much meat or dairy, mostly rice and vegetables. Doesn't drink Glucerna or other protein supplements. Discussed the benefits of these with patient. Pt denies issues with chewing or swallowing.  Surgery planned for 9/7.  Per weight records, pt has lost 39 lbs since 5/17 (18% wt loss x 3.5 months, significant for time frame).  Medications: Vitamin D, Multivitamin with minerals daily, Senokot, Miralax  Labs reviewed.  NUTRITION - FOCUSED PHYSICAL EXAM:  Flowsheet Row Most Recent Value  Orbital Region Mild depletion  Upper Arm Region Moderate depletion  Thoracic and Lumbar Region Unable to assess  Buccal Region Moderate depletion  Temple Region Moderate depletion  Clavicle Bone Region Mild depletion  Clavicle and Acromion Bone Region Mild depletion  Scapular Bone Region Mild depletion  Dorsal Hand Moderate depletion  Patellar Region Unable to assess  Anterior Thigh Region Unable to assess  Posterior Calf Region Unable to assess  Edema (RD Assessment) None  Hair Reviewed  Eyes Reviewed  Mouth  Reviewed  Skin Reviewed       Diet Order:   Diet Order             Diet regular Room service appropriate? Yes; Fluid consistency: Thin  Diet effective now                   EDUCATION NEEDS:   No education needs have been identified at this time  Skin:  Skin Assessment: Reviewed RN Assessment  Last BM:  9/5 -type 4  Height:   Ht Readings from Last 1 Encounters:  04/17/22 6' (1.829 m)    Weight:   Wt Readings from Last 1 Encounters:  04/17/22 77.1 kg    BMI:  Body mass index is 23.06 kg/m.  Estimated Nutritional Needs:   Kcal:  1950-2150  Protein:  95-105g  Fluid:  2L/day  Tilda Franco, MS, RD, LDN Inpatient Clinical Dietitian Contact information available via Amion

## 2022-04-18 NOTE — H&P (Signed)
[place]   PATIENT NAME: Bryan Farrell    MR#:  [id number]  DATE OF BIRTH:  03--1952  DATE OF ADMISSION:  04/17/2022  PRIMARY CARE PHYSICIAN: Eloisa Northern, MD   Patient is coming from: ALF  REQUESTING/REFERRING PHYSICIAN: Sloan Leiter, DO   CHIEF COMPLAINT:   Chief Complaint  Patient presents with   Hip Injury    right    HISTORY OF PRESENT ILLNESS:  Bryan Farrell is a 71 y.o. Caucasian male with medical history significant for dementia, type 2 diabetes mellitus, and hypertension, who presented to the emergency room with acute onset of right hip pain.  The patient fell 3 days ago at his assisted living facility and has been complaining of right hip pain.  X-ray reportedly performed at the facility showed right femoral fracture.  He did not recall any head injuries and is unsure if he passed out or not.  No witnessed seizures.  He denies any paresthesias or focal muscle weakness.  No chest pain or palpitations.  No cough or wheezing or dyspnea.  No dysuria, oliguria, hematuria, urgency or frequency or flank pain.  No fever or chills.  ED Course: When he came to the ER, vital signs were within normal.  Labs revealed unremarkable BMP.  CBC showed anemia close to baseline.  Labs revealed EKG as reviewed by me : EKG showed normal sinus rhythm with a rate of 68 with low voltage QRS.   Imaging: Portable chest ray showed low lung volumes without acute abnormality and multiple skin folds project over the left hemithorax.  MRI of the right have revealed subcapital femoral fracture.  Dr. Shon Baton was notified about the patient and Dr. Linna Caprice will evaluate the patient in AM.  The patient will be admitted to a medical bed for further evaluation and management. 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   Hypertension     PAST SURGICAL HISTORY:   Past Surgical  History:  Procedure Laterality Date   AMPUTATION Left 05/30/2019   Procedure: LEFT FOOT 2ND RAY AMPUTATION;  Surgeon: Nadara Mustard, MD;  Location: [facility] OR;  Service: Orthopedics;  Laterality: Left;   AMPUTATION Right 12/28/2021   Procedure: RIGHT 2ND TOE AMPUTATION;  Surgeon: Nadara Mustard, MD;  Location: [facility] OR;  Service: Orthopedics;  Laterality: Right;   TOE AMPUTATION Right    TONSILLECTOMY      SOCIAL HISTORY:   Social History   Tobacco Use   Smoking status: Never   Smokeless tobacco: Never  Substance Use Topics   Alcohol use: Not Currently    FAMILY HISTORY:   Family History  Problem Relation Age of Onset   Heart Problems Mother    Heart Problems Father     DRUG ALLERGIES:  No Known Allergies  REVIEW OF SYSTEMS:   ROS As per history of present illness. All pertinent systems were reviewed above. Constitutional, HEENT, cardiovascular, respiratory, GI, GU, musculoskeletal, neuro, psychiatric, endocrine, integumentary and hematologic systems were reviewed and are otherwise negative/unremarkable except for positive findings mentioned above in the HPI.   MEDICATIONS AT HOME:   Prior to Admission medications   Medication Sig Start Date End Date Taking? Authorizing Provider  acetaminophen (TYLENOL) 325 MG tablet Take 650 mg by mouth in the morning.   Yes [provider]  atorvastatin (LIPITOR) 40 MG tablet Take 40  mg by mouth at bedtime. 03/26/20  Yes [provider]  buPROPion (WELLBUTRIN) 100 MG tablet Take 100 mg by mouth in the morning and at bedtime.   Yes [provider]  chlorhexidine (PERIDEX) 0.12 % solution 15 mLs by Mouth Rinse route in the morning.   Yes [provider]  cholecalciferol (VITAMIN D) 25 MCG (1000 UNIT) tablet Take 1,000 Units by mouth daily. 02/12/20  Yes [provider]  divalproex (DEPAKOTE) 125 MG DR tablet Take 125 mg by mouth at bedtime. 08/21/21  Yes [provider]  feeding supplement,  GLUCERNA SHAKE, (GLUCERNA SHAKE) LIQD Take 237 mLs by mouth 3 (three) times daily after meals.   Yes [provider]  gabapentin (NEURONTIN) 100 MG capsule Take 1 capsule (100 mg total) by mouth 3 (three) times daily. 04/16/19  Yes Benjiman Core, MD  insulin glargine (LANTUS) 100 UNIT/ML injection Inject 0.05 mLs (5 Units total) into the skin at bedtime. 06/03/19  Yes Christian, Rylee, MD  lidocaine 4 % Place 1 patch onto the skin daily.   Yes [provider]  Multiple Vitamins-Minerals (MULTIVITAMIN WITH MINERALS) tablet Take 1 tablet by mouth daily.   Yes [provider]  ondansetron (ZOFRAN) 4 MG tablet Take 4 mg by mouth every 8 (eight) hours as needed for nausea or vomiting.   Yes [provider]  polyethylene glycol powder (GLYCOLAX/MIRALAX) 17 GM/SCOOP powder Take 17 g by mouth in the morning and at bedtime.   Yes [provider]  scopolamine (TRANSDERM-SCOP) 1 MG/3DAYS Place 1 patch onto the skin every three (3) days as needed (for motion sickness with emesis- "apply 4 hours before OOB with therapy").   Yes [provider]  sennosides-docusate sodium (SENOKOT-S) 8.6-50 MG tablet Take 1-2 tablets by mouth See admin instructions. Take 1 tablet by mouth in the morning and 2 tablets at 5 PM daily   Yes [provider]  sertraline (ZOLOFT) 100 MG tablet Take 200 mg by mouth daily. 03/20/20  Yes [provider]  TRULICITY 1.5 MG/0.5ML SOPN Inject 1.5 mg into the skin every Saturday. 04/17/20  Yes [provider]      VITAL SIGNS:  Blood pressure (!) 150/93, pulse 71, temperature 98.2 F (36.8 C), temperature source Oral, resp. rate 16, height 6' (1.829 m), weight 77.1 kg, SpO2 96 %.  PHYSICAL EXAMINATION:  Physical Exam  GENERAL:  71 y.o.-year-old Caucasian male patient lying in the bed with no acute distress.  EYES: Pupils equal, round, reactive to light and accommodation. No scleral icterus. Extraocular muscles  intact.  HEENT: Head atraumatic, normocephalic. Oropharynx and nasopharynx clear.  NECK:  Supple, no jugular venous distention. No thyroid enlargement, no tenderness.  LUNGS: Normal breath sounds bilaterally, no wheezing, rales,rhonchi or crepitation. No use of accessory muscles of respiration.  CARDIOVASCULAR: Regular rate and rhythm, S1, S2 normal. No murmurs, rubs, or gallops.  ABDOMEN: Soft, nondistended, nontender. Bowel sounds present. No organomegaly or mass.  EXTREMITIES: No pedal edema, cyanosis, or clubbing. Musculoskeletal: Right lateral hip tenderness. NEUROLOGIC: Cranial nerves II through XII are intact. Muscle strength 5/5 in all extremities. Sensation intact. Gait not checked.  PSYCHIATRIC: The patient is alert and oriented x 3.  Normal affect and good eye contact. SKIN: No obvious rash, lesion, or ulcer.   LABORATORY PANEL:   CBC Recent Labs  Lab 04/18/22 0002  WBC 9.4  HGB 10.8*  HCT 32.6*  PLT 312   ------------------------------------------------------------------------------------------------------------------  Chemistries  Recent Labs  Lab 04/18/22 0002  NA 140  K 4.3  CL 105  CO2 27  GLUCOSE 110*  BUN 21  CREATININE 1.16  CALCIUM 8.9   ------------------------------------------------------------------------------------------------------------------  Cardiac Enzymes No results for input(s): "TROPONINI" in the last 168 hours. ------------------------------------------------------------------------------------------------------------------  RADIOLOGY:  DG Chest Port 1 View  Result Date: 04/17/2022 CLINICAL DATA:  Preop, hip fracture. EXAM: PORTABLE CHEST 1 VIEW COMPARISON:  08/25/2021 FINDINGS: Low lung volumes.The cardiomediastinal contours are normal. Multiple skin folds project over the left hemithorax. Pulmonary vasculature is normal. No consolidation, pleural effusion, or pneumothorax. No acute osseous abnormalities are seen. IMPRESSION: 1. Low  lung volumes without acute abnormality. 2. Multiple skin folds project over the left hemithorax. Electronically Signed   By: Narda Rutherford M.D.   On: 04/17/2022 23:57   DG Knee 2 Views Right  Result Date: 04/17/2022 CLINICAL DATA:  knee pain. Pt c/o right knee pain after having a right hip injury, Unable to hold his leg straight for images EXAM: RIGHT KNEE - 1-2 VIEW COMPARISON:  None Available. FINDINGS: No evidence of fracture, dislocation, or joint effusion. No evidence of arthropathy or other focal bone abnormality. Soft tissues are unremarkable. Vascular calcification. IMPRESSION: Negative. Electronically Signed   By: Tish Frederickson M.D.   On: 04/17/2022 17:21   DG Hip Unilat W or Wo Pelvis 2-3 Views Right  Result Date: 04/17/2022 CLINICAL DATA:  Right hip pain after a fall 3 days ago. EXAM: DG HIP (WITH OR WITHOUT PELVIS) 2-3V RIGHT COMPARISON:  None Available. FINDINGS: No fracture.  No bone lesion. Mild narrowing of the superolateral right hip joint space. Small marginal osteophytes from the base of the right femoral head. Left hip joint normally spaced and aligned. SI joints and symphysis pubis normally spaced and aligned. Bilateral iliac and femoral artery vascular calcifications. IMPRESSION: 1. No fracture or acute finding. 2. Mild right hip joint osteoarthritis. Electronically Signed   By: Amie Portland M.D.   On: 04/17/2022 15:47      IMPRESSION AND PLAN:  Assessment and Plan: * Closed right hip fracture (HCC) - The patient be admitted to a medical bed. - Pain management will be provided. - Orthopedic consultation will be obtained. - Dr. Shon Baton was notified about the patient. - Dr. Linna Caprice will be seeing the patient in AM.  Depression - We will continue Wellbutrin and Zoloft as well as Depakote DR.  Type 2 diabetes mellitus with peripheral neuropathy (HCC) - The patient will be placed on supplemental coverage with subcutaneous NovoLog. - We will continue his basal  coverage. - We will continue Neurontin.  Dyslipidemia - We will continue Statin therapy.   DVT prophylaxis: Lovenox.  Advanced Care Planning:  Code Status: full code.  Family Communication:  The plan of care was discussed in details with the patient (and family). I answered all questions. The patient agreed to proceed with the above mentioned plan. Further management will depend upon hospital course. Disposition Plan: Back to previous home environment Consults called: Orthopedic surgery. All the records are reviewed and case discussed with ED provider.  Status is: Inpatient  At the time of the admission, it appears that the appropriate admission status for this patient is inpatient.  This is judged to be reasonable and necessary in order to provide the required intensity of service to ensure the patient's safety given the presenting symptoms, physical exam findings and initial radiographic and laboratory data in the context of comorbid conditions.  The patient requires inpatient status due to high intensity of service, high risk of  further deterioration and high frequency of surveillance required.  I certify that at the time of admission, it is my clinical judgment that the patient will require inpatient hospital care extending more than 2 midnights.                            Dispo: The patient is from: Home              Anticipated d/c is to: Home              Patient currently is not medically stable to d/c.              Difficult to place patient: No  Hannah Beat M.D on 04/18/2022 at 4:00 AM  [facility]   From 7 PM-7 AM, contact night-coverage [email or web address]  CC: Primary care physician; Eloisa Northern, MD

## 2022-04-18 NOTE — Assessment & Plan Note (Signed)
-   The patient be admitted to a medical bed. - Pain management will be provided. - Orthopedic consultation will be obtained. - Dr. Shon Baton was notified about the patient. - Dr. Linna Caprice will be seeing the patient in AM.

## 2022-04-18 NOTE — ED Notes (Signed)
Patient is resting in bed. Call bell is within reach. 

## 2022-04-19 ENCOUNTER — Inpatient Hospital Stay (HOSPITAL_COMMUNITY): Payer: Medicare Other

## 2022-04-19 DIAGNOSIS — S72001A Fracture of unspecified part of neck of right femur, initial encounter for closed fracture: Secondary | ICD-10-CM | POA: Diagnosis not present

## 2022-04-19 DIAGNOSIS — I739 Peripheral vascular disease, unspecified: Secondary | ICD-10-CM

## 2022-04-19 LAB — GLUCOSE, CAPILLARY
Glucose-Capillary: 112 mg/dL — ABNORMAL HIGH (ref 70–99)
Glucose-Capillary: 151 mg/dL — ABNORMAL HIGH (ref 70–99)
Glucose-Capillary: 151 mg/dL — ABNORMAL HIGH (ref 70–99)
Glucose-Capillary: 112 mg/dL — ABNORMAL HIGH (ref 70–99)

## 2022-04-19 LAB — SURGICAL PCR SCREEN
MRSA, PCR: NEGATIVE
Staphylococcus aureus: NEGATIVE

## 2022-04-19 NOTE — Progress Notes (Signed)
PROGRESS NOTE  Bryan Farrell  ENI:[id number] DOB: 1951/02/23 DOA: 04/17/2022 PCP: Eloisa Northern, MD   Brief Narrative: Patient is a 71 year old male with history of dementia, diabetes type 2, hypertension who presented to the emergency department with complaint of right hip pain after a fall at assisted living facility.  X-ray performed at the facility showed right femoral fracture.  No head injuries or loss of consciousness.  On presentation he was hemodynamically stable.  X-ray did not show any acute findings MRI of the right hip showed subcapital femoral fracture.  Orthopedics consulted.  Plan for ORIF tomorrow.  Assessment & Plan:  Principal Problem:   Closed right hip fracture (HCC) Active Problems:   Type 2 diabetes mellitus with peripheral neuropathy (HCC)   Depression   Dyslipidemia   Closed subcapital fracture of right femur (HCC)  Closed right hip fracture: Fell at assisted living facility.  MRI of the right hip showed subcapital femoral fracture.  Orthopedics following.  Continue pain management and supportive care.  Plan for ORIF tomorrow.  PT/OT evaluation after surgery, chemical DVT prophylaxis after surgery.  History of depression: On Wellbutrin, Zoloft, Depakote  Type 2 diabetes with peripheral neuropathy: On Neurontin.  Continue current insulin regimen.  Monitor blood sugars.  Hemoglobin A1c of 6.7  Hyperlipidemia: On statin  History of arthritis: Has debility due to bilateral knee arthritis, has difficulty in ambulation, previously ambulating with walker    Nutrition Problem: Moderate Malnutrition Etiology: chronic illness (dementia)    DVT prophylaxis:SCDs Start: 04/18/22 0323     Code Status: Full Code  Family Communication: None at bedside  Patient status:Inpatient  Patient is from :ALF  Anticipated discharge to:ALF vs SnF  Estimated DC date:2-3 days   Consultants: Ortho  Procedures:Plan for ORIF  Antimicrobials:  Anti-infectives (From  admission, onward)    None       Subjective: Patient seen and examined at the bedside this morning.  Hemodynamically stable.  Overall comfortable, lying in bed.  Complains of right knee pain today.  Objective: Vitals:   04/18/22 2110 04/19/22 0134 04/19/22 0640 04/19/22 0942  BP: 133/83 127/82 120/75 137/78  Pulse: 66 65 66 66  Resp: 17 17 17 17   Temp: 98.4 F (36.9 C) 98.2 F (36.8 C) 98.2 F (36.8 C) 98.2 F (36.8 C)  TempSrc: Oral Oral Oral Oral  SpO2: 95% 97% 95% 97%  Weight:      Height:        Intake/Output Summary (Last 24 hours) at 04/19/2022 0945 Last data filed at 04/19/2022 06/19/2022 Gross per 24 hour  Intake 2823.05 ml  Output 1400 ml  Net 1423.05 ml   Filed Weights   04/17/22 1452  Weight: 77.1 kg    Examination:  General exam: Overall comfortable, not in distress, looks deconditioned, older than age HEENT: PERRL Respiratory system:  no wheezes or crackles  Cardiovascular system: S1 & S2 heard, RRR.  Gastrointestinal system: Abdomen is nondistended, soft and nontender. Central nervous system: Alert and oriented Extremities: No edema, no clubbing ,no cyanosis, tenderness on the right hip Skin: No rashes, no ulcers,no icterus     Data Reviewed: I have personally reviewed following labs and imaging studies  CBC: Recent Labs  Lab 04/18/22 0002 04/18/22 0522  WBC 9.4 10.0  NEUTROABS 5.9  --   HGB 10.8* 10.5*  HCT 32.6* 32.1*  MCV 92.1 92.0  PLT 312 282   Basic Metabolic Panel: Recent Labs  Lab 04/18/22 0002 04/18/22 0522  NA 140 140  K 4.3 4.3  CL 105 106  CO2 27 27  GLUCOSE 110* 110*  BUN 21 20  CREATININE 1.16 1.18  CALCIUM 8.9 8.8*     No results found for this or any previous visit (from the past 240 hour(s)).   Radiology Studies: MR HIP RIGHT WO CONTRAST  Result Date: 04/18/2022 CLINICAL DATA:  Larey Seat 4 days ago.  Right hip pain. EXAM: MR OF THE RIGHT HIP WITHOUT CONTRAST TECHNIQUE: Multiplanar, multisequence MR imaging was  performed. No intravenous contrast was administered. COMPARISON:  Radiographs 04/17/2022 FINDINGS: There is a mildly impacted subcapital fracture of the right hip. Moderate surrounding marrow edema. Small associated joint effusion. The left hip is intact. The pubic symphysis and SI joints are intact. No pelvic fractures or bone lesions. The hip and pelvic musculature is intact. No muscle tear or myositis. Mild fatty atrophy of the hip and pelvic musculature. No significant intrapelvic abnormalities are identified. IMPRESSION: 1. Mildly impacted subcapital fracture of the right hip. 2. Intact bony pelvis. 3. No significant intrapelvic abnormalities. Electronically Signed   By: Rudie Meyer M.D.   On: 04/18/2022 07:37   DG Chest Port 1 View  Result Date: 04/17/2022 CLINICAL DATA:  Preop, hip fracture. EXAM: PORTABLE CHEST 1 VIEW COMPARISON:  08/25/2021 FINDINGS: Low lung volumes.The cardiomediastinal contours are normal. Multiple skin folds project over the left hemithorax. Pulmonary vasculature is normal. No consolidation, pleural effusion, or pneumothorax. No acute osseous abnormalities are seen. IMPRESSION: 1. Low lung volumes without acute abnormality. 2. Multiple skin folds project over the left hemithorax. Electronically Signed   By: Narda Rutherford M.D.   On: 04/17/2022 23:57   DG Knee 2 Views Right  Result Date: 04/17/2022 CLINICAL DATA:  knee pain. Pt c/o right knee pain after having a right hip injury, Unable to hold his leg straight for images EXAM: RIGHT KNEE - 1-2 VIEW COMPARISON:  None Available. FINDINGS: No evidence of fracture, dislocation, or joint effusion. No evidence of arthropathy or other focal bone abnormality. Soft tissues are unremarkable. Vascular calcification. IMPRESSION: Negative. Electronically Signed   By: Tish Frederickson M.D.   On: 04/17/2022 17:21   DG Hip Unilat W or Wo Pelvis 2-3 Views Right  Result Date: 04/17/2022 CLINICAL DATA:  Right hip pain after a fall 3 days ago.  EXAM: DG HIP (WITH OR WITHOUT PELVIS) 2-3V RIGHT COMPARISON:  None Available. FINDINGS: No fracture.  No bone lesion. Mild narrowing of the superolateral right hip joint space. Small marginal osteophytes from the base of the right femoral head. Left hip joint normally spaced and aligned. SI joints and symphysis pubis normally spaced and aligned. Bilateral iliac and femoral artery vascular calcifications. IMPRESSION: 1. No fracture or acute finding. 2. Mild right hip joint osteoarthritis. Electronically Signed   By: Amie Portland M.D.   On: 04/17/2022 15:47    Scheduled Meds:  atorvastatin  40 mg Oral QHS   buPROPion  100 mg Oral BID   chlorhexidine  15 mL Mouth Rinse q AM   cholecalciferol  1,000 Units Oral Daily   divalproex  125 mg Oral QHS   feeding supplement (GLUCERNA SHAKE)  237 mL Oral TID PC   gabapentin  100 mg Oral TID   insulin aspart  0-5 Units Subcutaneous QHS   insulin aspart  0-9 Units Subcutaneous TID WC   insulin glargine-yfgn  5 Units Subcutaneous QHS   lidocaine  1 patch Transdermal Q24H   multivitamin with minerals  1 tablet Oral Daily   polyethylene  glycol  17 g Oral BID   senna-docusate  1 tablet Oral Daily   senna-docusate  2 tablet Oral QAC supper   sertraline  200 mg Oral Daily   Continuous Infusions:   LOS: 1 day   Burnadette Pop, MD [facility] P9/01/2022, 9:45 AM

## 2022-04-19 NOTE — Progress Notes (Signed)
ABI has been completed.   Results can be found under chart review under CV PROC. 04/19/2022 2:09 PM Keoni Havey RVT, RDMS  

## 2022-04-19 NOTE — Plan of Care (Signed)
  Problem: Clinical Measurements: Goal: Ability to maintain clinical measurements within normal limits will improve Outcome: Progressing   Problem: Nutrition: Goal: Adequate nutrition will be maintained Outcome: Progressing   Problem: Coping: Goal: Level of anxiety will decrease Outcome: Progressing   Problem: Safety: Goal: Ability to remain free from injury will improve Outcome: Progressing   Problem: Pain Managment: Goal: General experience of comfort will improve Outcome: Progressing   Problem: Skin Integrity: Goal: Risk for impaired skin integrity will decrease Outcome: Progressing

## 2022-04-20 ENCOUNTER — Other Ambulatory Visit: Payer: Self-pay

## 2022-04-20 ENCOUNTER — Inpatient Hospital Stay (HOSPITAL_COMMUNITY): Payer: Medicare Other

## 2022-04-20 ENCOUNTER — Encounter (HOSPITAL_COMMUNITY): Payer: Self-pay | Admitting: Family Medicine

## 2022-04-20 ENCOUNTER — Encounter (HOSPITAL_COMMUNITY)
Admission: EM | Disposition: A | Discharge status: Skilled Nursing Facility | Payer: Self-pay | Source: Skilled Nursing Facility | Attending: Internal Medicine

## 2022-04-20 ENCOUNTER — Inpatient Hospital Stay (HOSPITAL_COMMUNITY): Payer: Medicare Other | Admitting: Certified Registered"

## 2022-04-20 DIAGNOSIS — S72001A Fracture of unspecified part of neck of right femur, initial encounter for closed fracture: Secondary | ICD-10-CM

## 2022-04-20 DIAGNOSIS — Z7985 Long-term (current) use of injectable non-insulin antidiabetic drugs: Secondary | ICD-10-CM

## 2022-04-20 DIAGNOSIS — N189 Chronic kidney disease, unspecified: Secondary | ICD-10-CM

## 2022-04-20 DIAGNOSIS — I129 Hypertensive chronic kidney disease with stage 1 through stage 4 chronic kidney disease, or unspecified chronic kidney disease: Secondary | ICD-10-CM

## 2022-04-20 DIAGNOSIS — Z794 Long term (current) use of insulin: Secondary | ICD-10-CM

## 2022-04-20 DIAGNOSIS — E440 Moderate protein-calorie malnutrition: Secondary | ICD-10-CM | POA: Insufficient documentation

## 2022-04-20 DIAGNOSIS — E1122 Type 2 diabetes mellitus with diabetic chronic kidney disease: Secondary | ICD-10-CM

## 2022-04-20 HISTORY — PX: TOTAL HIP ARTHROPLASTY: SHX124

## 2022-04-20 LAB — BASIC METABOLIC PANEL
Anion gap: 6 (ref 5–15)
BUN: 18 mg/dL (ref 8–23)
CO2: 29 mmol/L (ref 22–32)
Calcium: 9 mg/dL (ref 8.9–10.3)
Chloride: 106 mmol/L (ref 98–111)
Creatinine, Ser: 1.29 mg/dL — ABNORMAL HIGH (ref 0.61–1.24)
GFR, Estimated: >60 mL/min (ref >60)
Glucose, Bld: 133 mg/dL — ABNORMAL HIGH (ref 70–99)
Potassium: 4.3 mmol/L (ref 3.5–5.1)
Sodium: 141 mmol/L (ref 135–145)

## 2022-04-20 LAB — CBC
HCT: 32.5 % — ABNORMAL LOW (ref 39.0–52.0)
Hemoglobin: 10.5 g/dL — ABNORMAL LOW (ref 13.0–17.0)
MCH: 29.6 pg (ref 26.0–34.0)
MCHC: 32.3 g/dL (ref 30.0–36.0)
MCV: 91.5 fL (ref 80.0–100.0)
Platelets: 293 10*3/uL (ref 150–400)
RBC: 3.55 MIL/uL — ABNORMAL LOW (ref 4.22–5.81)
RDW: 14 % (ref 11.5–15.5)
WBC: 8 10*3/uL (ref 4.0–10.5)
nRBC: 0 % (ref 0.0–0.2)

## 2022-04-20 LAB — ABO/RH: ABO/RH(D): A POS

## 2022-04-20 LAB — POCT I-STAT, CHEM 8
BUN: 23 mg/dL (ref 8–23)
Calcium, Ion: 1.19 mmol/L (ref 1.15–1.40)
Chloride: 103 mmol/L (ref 98–111)
Creatinine, Ser: 1.1 mg/dL (ref 0.61–1.24)
Glucose, Bld: 126 mg/dL — ABNORMAL HIGH (ref 70–99)
HCT: 32 % — ABNORMAL LOW (ref 39.0–52.0)
Hemoglobin: 10.9 g/dL — ABNORMAL LOW (ref 13.0–17.0)
Potassium: 5.7 mmol/L — ABNORMAL HIGH (ref 3.5–5.1)
Sodium: 137 mmol/L (ref 135–145)
TCO2: 28 mmol/L (ref 22–32)

## 2022-04-20 LAB — GLUCOSE, CAPILLARY
Glucose-Capillary: 98 mg/dL (ref 70–99)
Glucose-Capillary: 121 mg/dL — ABNORMAL HIGH (ref 70–99)
Glucose-Capillary: 169 mg/dL — ABNORMAL HIGH (ref 70–99)
Glucose-Capillary: 264 mg/dL — ABNORMAL HIGH (ref 70–99)

## 2022-04-20 SURGERY — ARTHROPLASTY, HIP, TOTAL, ANTERIOR APPROACH
Anesthesia: Monitor Anesthesia Care | Site: Hip | Laterality: Right

## 2022-04-20 MED ORDER — PROPOFOL 500 MG/50ML IV EMUL
INTRAVENOUS | Status: DC | PRN
  Administered 2022-04-20: 50 ug/kg/min via INTRAVENOUS

## 2022-04-20 MED ORDER — CEFAZOLIN SODIUM-DEXTROSE 2-4 GM/100ML-% IV SOLN
2.0000 g | INTRAVENOUS | Status: AC
  Administered 2022-04-20: 2 g via INTRAVENOUS
  Filled 2022-04-20: qty 100

## 2022-04-20 MED ORDER — ONDANSETRON HCL 4 MG PO TABS: 4.0000 mg | ORAL_TABLET | Freq: Four times a day (QID) | ORAL | Status: DC | PRN

## 2022-04-20 MED ORDER — ISOPROPYL ALCOHOL 70 % SOLN
Status: DC | PRN
  Administered 2022-04-20: 1 via TOPICAL

## 2022-04-20 MED ORDER — BUPIVACAINE-EPINEPHRINE 0.25% -1:200000 IJ SOLN
INTRAMUSCULAR | Status: DC | PRN
  Administered 2022-04-20: 30 mL

## 2022-04-20 MED ORDER — 0.9 % SODIUM CHLORIDE (POUR BTL) OPTIME
TOPICAL | Status: DC | PRN
  Administered 2022-04-20: 1000 mL

## 2022-04-20 MED ORDER — CEFAZOLIN SODIUM-DEXTROSE 2-4 GM/100ML-% IV SOLN
2.0000 g | Freq: Four times a day (QID) | INTRAVENOUS | Status: AC
  Administered 2022-04-20 – 2022-04-21 (×2): 2 g via INTRAVENOUS
  Filled 2022-04-20 (×2): qty 100

## 2022-04-20 MED ORDER — BUPIVACAINE-EPINEPHRINE (PF) 0.25% -1:200000 IJ SOLN
INTRAMUSCULAR | Status: AC
  Filled 2022-04-20: qty 30

## 2022-04-20 MED ORDER — KETOROLAC TROMETHAMINE 30 MG/ML IJ SOLN
INTRAMUSCULAR | Status: AC
  Filled 2022-04-20: qty 1

## 2022-04-20 MED ORDER — DOCUSATE SODIUM 100 MG PO CAPS
100.0000 mg | ORAL_CAPSULE | Freq: Two times a day (BID) | ORAL | Status: DC
  Administered 2022-04-20 – 2022-04-24 (×6): 100 mg via ORAL
  Filled 2022-04-20 (×7): qty 1

## 2022-04-20 MED ORDER — POVIDONE-IODINE 10 % EX SWAB: 2.0000 | Freq: Once | CUTANEOUS | Status: DC

## 2022-04-20 MED ORDER — METHOCARBAMOL 500 MG PO TABS
500.0000 mg | ORAL_TABLET | Freq: Four times a day (QID) | ORAL | Status: DC | PRN
  Administered 2022-04-22 – 2022-04-24 (×2): 500 mg via ORAL
  Filled 2022-04-20 (×2): qty 1

## 2022-04-20 MED ORDER — METOCLOPRAMIDE HCL 5 MG/ML IJ SOLN: 5.0000 mg | Freq: Three times a day (TID) | INTRAMUSCULAR | Status: DC | PRN

## 2022-04-20 MED ORDER — MENTHOL 3 MG MT LOZG: 1.0000 | LOZENGE | OROMUCOSAL | Status: DC | PRN

## 2022-04-20 MED ORDER — EPHEDRINE SULFATE (PRESSORS) 50 MG/ML IJ SOLN
INTRAMUSCULAR | Status: DC | PRN
  Administered 2022-04-20: 15 mg via INTRAVENOUS

## 2022-04-20 MED ORDER — ASPIRIN 81 MG PO CHEW
81.0000 mg | CHEWABLE_TABLET | Freq: Two times a day (BID) | ORAL | Status: DC
  Administered 2022-04-20 – 2022-04-24 (×6): 81 mg via ORAL
  Filled 2022-04-20 (×7): qty 1

## 2022-04-20 MED ORDER — TRANEXAMIC ACID-NACL 1000-0.7 MG/100ML-% IV SOLN
1000.0000 mg | INTRAVENOUS | Status: AC
  Administered 2022-04-20: 1000 mg via INTRAVENOUS
  Filled 2022-04-20: qty 100

## 2022-04-20 MED ORDER — DEXAMETHASONE SODIUM PHOSPHATE 10 MG/ML IJ SOLN
INTRAMUSCULAR | Status: DC | PRN
  Administered 2022-04-20: 8 mg via INTRAVENOUS

## 2022-04-20 MED ORDER — POLYETHYLENE GLYCOL 3350 17 G PO PACK: 17.0000 g | PACK | Freq: Every day | ORAL | Status: DC | PRN

## 2022-04-20 MED ORDER — SODIUM CHLORIDE 0.9 % IR SOLN
Status: DC | PRN
  Administered 2022-04-20 (×2): 1000 mL

## 2022-04-20 MED ORDER — ONDANSETRON HCL 4 MG/2ML IJ SOLN
4.0000 mg | Freq: Four times a day (QID) | INTRAMUSCULAR | Status: DC | PRN
  Administered 2022-04-21: 4 mg via INTRAVENOUS
  Filled 2022-04-20: qty 2

## 2022-04-20 MED ORDER — BUPIVACAINE IN DEXTROSE 0.75-8.25 % IT SOLN
INTRATHECAL | Status: DC | PRN
  Administered 2022-04-20: 1.6 mL via INTRATHECAL

## 2022-04-20 MED ORDER — LACTATED RINGERS IV SOLN: INTRAVENOUS | Status: DC | PRN

## 2022-04-20 MED ORDER — METHOCARBAMOL 1000 MG/10ML IJ SOLN: 500.0000 mg | Freq: Four times a day (QID) | INTRAVENOUS | Status: DC | PRN

## 2022-04-20 MED ORDER — ALBUMIN HUMAN 5 % IV SOLN: INTRAVENOUS | Status: DC | PRN

## 2022-04-20 MED ORDER — PHENYLEPHRINE HCL-NACL 20-0.9 MG/250ML-% IV SOLN
INTRAVENOUS | Status: DC | PRN
  Administered 2022-04-20: 25 ug/min via INTRAVENOUS

## 2022-04-20 MED ORDER — METOCLOPRAMIDE HCL 5 MG PO TABS: 5.0000 mg | ORAL_TABLET | Freq: Three times a day (TID) | ORAL | Status: DC | PRN

## 2022-04-20 MED ORDER — PHENOL 1.4 % MT LIQD
1.0000 | OROMUCOSAL | Status: DC | PRN
Start: 2022-04-20 — End: 2022-04-25

## 2022-04-20 MED ORDER — ONDANSETRON HCL 4 MG/2ML IJ SOLN
INTRAMUSCULAR | Status: DC | PRN
  Administered 2022-04-20: 4 mg via INTRAVENOUS

## 2022-04-20 MED ORDER — SODIUM CHLORIDE 0.9 % IV SOLN: INTRAVENOUS | Status: DC

## 2022-04-20 MED ORDER — CHLORHEXIDINE GLUCONATE 4 % EX LIQD: 60.0000 mL | Freq: Once | CUTANEOUS | Status: DC

## 2022-04-20 MED ORDER — SENNA 8.6 MG PO TABS
1.0000 | ORAL_TABLET | Freq: Two times a day (BID) | ORAL | Status: DC
  Administered 2022-04-20 – 2022-04-24 (×6): 8.6 mg via ORAL
  Filled 2022-04-20 (×7): qty 1

## 2022-04-20 MED ORDER — DIPHENHYDRAMINE HCL 12.5 MG/5ML PO ELIX: 12.5000 mg | ORAL_SOLUTION | ORAL | Status: DC | PRN

## 2022-04-20 MED ORDER — KETOROLAC TROMETHAMINE 30 MG/ML IJ SOLN
INTRAMUSCULAR | Status: DC | PRN
  Administered 2022-04-20: 30 mg

## 2022-04-20 MED ORDER — PHENYLEPHRINE HCL-NACL 20-0.9 MG/250ML-% IV SOLN
INTRAVENOUS | Status: AC
  Filled 2022-04-20: qty 250

## 2022-04-20 MED ORDER — SODIUM CHLORIDE (PF) 0.9 % IJ SOLN
INTRAMUSCULAR | Status: AC
  Filled 2022-04-20: qty 50

## 2022-04-20 MED ORDER — POVIDONE-IODINE 10 % EX SWAB
2.0000 | Freq: Once | CUTANEOUS | Status: AC
  Administered 2022-04-20: 2 via TOPICAL

## 2022-04-20 MED ORDER — ACETAMINOPHEN 500 MG PO TABS: 1000.0000 mg | ORAL_TABLET | Freq: Once | ORAL | Status: DC

## 2022-04-20 MED ORDER — ACETAMINOPHEN 10 MG/ML IV SOLN
INTRAVENOUS | Status: AC
  Filled 2022-04-20: qty 100

## 2022-04-20 MED ORDER — PROPOFOL 1000 MG/100ML IV EMUL
INTRAVENOUS | Status: AC
  Filled 2022-04-20: qty 100

## 2022-04-20 MED ORDER — ACETAMINOPHEN 10 MG/ML IV SOLN
INTRAVENOUS | Status: DC | PRN
  Administered 2022-04-20: 1000 mg via INTRAVENOUS

## 2022-04-20 MED ORDER — SODIUM CHLORIDE (PF) 0.9 % IJ SOLN
INTRAMUSCULAR | Status: AC
  Filled 2022-04-20: qty 30

## 2022-04-20 MED ORDER — DEXAMETHASONE SODIUM PHOSPHATE 10 MG/ML IJ SOLN
INTRAMUSCULAR | Status: AC
  Filled 2022-04-20: qty 1

## 2022-04-20 MED ORDER — MIDAZOLAM HCL 2 MG/2ML IJ SOLN
INTRAMUSCULAR | Status: AC
  Filled 2022-04-20: qty 2

## 2022-04-20 MED ORDER — MIDAZOLAM HCL 5 MG/5ML IJ SOLN
INTRAMUSCULAR | Status: DC | PRN
  Administered 2022-04-20: 2 mg via INTRAVENOUS

## 2022-04-20 MED ORDER — ALUM & MAG HYDROXIDE-SIMETH 200-200-20 MG/5ML PO SUSP: 30.0000 mL | ORAL | Status: DC | PRN

## 2022-04-20 MED ORDER — SODIUM CHLORIDE (PF) 0.9 % IJ SOLN
INTRAMUSCULAR | Status: DC | PRN
  Administered 2022-04-20: 30 mL

## 2022-04-20 MED ORDER — ONDANSETRON HCL 4 MG/2ML IJ SOLN
INTRAMUSCULAR | Status: AC
  Filled 2022-04-20: qty 2

## 2022-04-20 SURGICAL SUPPLY — 59 items
ADH SKN CLS APL DERMABOND .7 (GAUZE/BANDAGES/DRESSINGS) ×1
APL PRP STRL LF DISP 70% ISPRP (MISCELLANEOUS) ×1
BAG COUNTER SPONGE SURGICOUNT (BAG) IMPLANT
BAG DECANTER FOR FLEXI CONT (MISCELLANEOUS) IMPLANT
BAG SPEC THK2 15X12 ZIP CLS (MISCELLANEOUS)
BAG SPNG CNTER NS LX DISP (BAG)
BAG ZIPLOCK 12X15 (MISCELLANEOUS) IMPLANT
CHLORAPREP W/TINT 26 (MISCELLANEOUS) ×2 IMPLANT
COVER PERINEAL POST (MISCELLANEOUS) ×2 IMPLANT
COVER SURGICAL LIGHT HANDLE (MISCELLANEOUS) ×2 IMPLANT
DERMABOND ADVANCED (GAUZE/BANDAGES/DRESSINGS) ×1
DERMABOND ADVANCED .7 DNX12 (GAUZE/BANDAGES/DRESSINGS) ×4 IMPLANT
DRAPE IMP U-DRAPE 54X76 (DRAPES) ×2 IMPLANT
DRAPE SHEET LG 3/4 BI-LAMINATE (DRAPES) ×6 IMPLANT
DRAPE STERI IOBAN 125X83 (DRAPES) ×2 IMPLANT
DRAPE U-SHAPE 47X51 STRL (DRAPES) ×4 IMPLANT
DRSG AQUACEL AG ADV 3.5X10 (GAUZE/BANDAGES/DRESSINGS) ×2 IMPLANT
ELECT REM PT RETURN 15FT ADLT (MISCELLANEOUS) ×2 IMPLANT
GAUZE SPONGE 4X4 12PLY STRL (GAUZE/BANDAGES/DRESSINGS) ×2 IMPLANT
GLOVE BIO SURGEON STRL SZ8.5 (GLOVE) ×4 IMPLANT
GLOVE BIOGEL M 7.0 STRL (GLOVE) ×2 IMPLANT
GLOVE BIOGEL PI IND STRL 7.5 (GLOVE) ×2 IMPLANT
GLOVE BIOGEL PI IND STRL 8.5 (GLOVE) ×2 IMPLANT
GLOVE SURG LX STRL 7.5 STRW (GLOVE) ×4 IMPLANT
GOWN SPEC L3 XXLG W/TWL (GOWN DISPOSABLE) ×2 IMPLANT
GOWN STRL REUS W/ TWL XL LVL3 (GOWN DISPOSABLE) ×2 IMPLANT
GOWN STRL REUS W/TWL XL LVL3 (GOWN DISPOSABLE) ×1
HANDPIECE INTERPULSE COAX TIP (DISPOSABLE) ×1
HEAD CERAMIC DELTA 36 PLUS 1.5 (Hips) IMPLANT
HOLDER FOLEY CATH W/STRAP (MISCELLANEOUS) ×2 IMPLANT
HOOD PEEL AWAY FLYTE STAYCOOL (MISCELLANEOUS) ×6 IMPLANT
KIT TURNOVER KIT A (KITS) IMPLANT
LINER ACETAB OFF G7 5 36 +5 (Liner) IMPLANT
MANIFOLD NEPTUNE II (INSTRUMENTS) ×2 IMPLANT
MARKER SKIN DUAL TIP RULER LAB (MISCELLANEOUS) ×2 IMPLANT
NDL SAFETY ECLIP 18X1.5 (MISCELLANEOUS) ×2 IMPLANT
NDL SPNL 18GX3.5 QUINCKE PK (NEEDLE) ×2 IMPLANT
NEEDLE SPNL 18GX3.5 QUINCKE PK (NEEDLE) ×1 IMPLANT
PACK ANTERIOR HIP CUSTOM (KITS) ×2 IMPLANT
PENCIL SMOKE EVACUATOR (MISCELLANEOUS) IMPLANT
SAW OSC TIP CART 19.5X105X1.3 (SAW) ×2 IMPLANT
SEALER BIPOLAR AQUA 6.0 (INSTRUMENTS) ×2 IMPLANT
SET HNDPC FAN SPRY TIP SCT (DISPOSABLE) ×2 IMPLANT
SHELL ACET G7 4H 58 SZG HIP (Shell) IMPLANT
SOLUTION PRONTOSAN WOUND 350ML (IRRIGATION / IRRIGATOR) ×2 IMPLANT
SPIKE FLUID TRANSFER (MISCELLANEOUS) ×2 IMPLANT
STEM FEM DIST PS 20X160 T1 (Stem) IMPLANT
STEM HIP CMTLS REV (Stem) IMPLANT
SUT MNCRL AB 3-0 PS2 18 (SUTURE) ×2 IMPLANT
SUT MON AB 2-0 CT1 36 (SUTURE) ×2 IMPLANT
SUT STRATAFIX PDO 1 14 VIOLET (SUTURE) ×1
SUT STRATFX PDO 1 14 VIOLET (SUTURE) ×1
SUT VIC AB 2-0 CT1 27 (SUTURE)
SUT VIC AB 2-0 CT1 TAPERPNT 27 (SUTURE) IMPLANT
SUTURE STRATFX PDO 1 14 VIOLET (SUTURE) ×2 IMPLANT
SYR 3ML LL SCALE MARK (SYRINGE) ×2 IMPLANT
TRAY FOLEY MTR SLVR 16FR STAT (SET/KITS/TRAYS/PACK) IMPLANT
TUBE SUCTION HIGH CAP CLEAR NV (SUCTIONS) ×2 IMPLANT
WATER STERILE IRR 1000ML POUR (IV SOLUTION) ×2 IMPLANT

## 2022-04-20 NOTE — Progress Notes (Signed)
PROGRESS NOTE  Bryan Farrell  QQV:[id number] DOB: 11-Apr-1951 DOA: 04/17/2022 PCP: Eloisa Northern, MD   Brief Narrative: Patient is a 71 year old male with history of dementia, diabetes type 2, hypertension who presented to the emergency department with complaint of right hip pain after a fall at assisted living facility.  X-ray performed at the facility showed right femoral fracture.  No head injuries or loss of consciousness.  On presentation he was hemodynamically stable.  X-ray did not show any acute findings MRI of the right hip showed subcapital femoral fracture.  Orthopedics consulted.  Plan for ORIF today.  Assessment & Plan:  Principal Problem:   Closed right hip fracture (HCC) Active Problems:   Type 2 diabetes mellitus with peripheral neuropathy (HCC)   Depression   Dyslipidemia   Closed subcapital fracture of right femur (HCC)  Closed right hip fracture: Fell at assisted living facility.  MRI of the right hip showed subcapital femoral fracture.  Orthopedics following.  Continue pain management and supportive care.  Plan for ORIF today.  PT/OT evaluation after surgery, chemical DVT prophylaxis after surgery.  History of depression: On Wellbutrin, Zoloft, Depakote  Type 2 diabetes with peripheral neuropathy: On Neurontin.  Continue current insulin regimen.  Monitor blood sugars.  Hemoglobin A1c of 6.7  Hyperlipidemia: On statin  History of arthritis: Has debility due to bilateral knee arthritis, has difficulty in ambulation, previously ambulating with walker    Nutrition Problem: Moderate Malnutrition Etiology: chronic illness (dementia)    DVT prophylaxis:SCDs Start: 04/18/22 0323     Code Status: Full Code  Family Communication: None at bedside  Patient status:Inpatient  Patient is from :ALF  Anticipated discharge to:ALF vs SnF  Estimated DC date:2-3 days   Consultants: Ortho  Procedures:Plan for ORIF  Antimicrobials:  Anti-infectives (From admission,  onward)    Start     Dose/Rate Route Frequency Ordered Stop   04/20/22 1000  ceFAZolin (ANCEF) IVPB 2g/100 mL premix        2 g 200 mL/hr over 30 Minutes Intravenous On call to O.R. 04/20/22 3329 04/21/22 0559       Subjective: Patient seen and examined at the bedside today.  Comfortable, lying in bed.  Denies any pain on the right hip or right knee today  Objective: Vitals:   04/19/22 1345 04/19/22 2128 04/20/22 0531 04/20/22 0912  BP: 130/78 (!) 155/84 115/74 115/76  Pulse: 67 69 66 61  Resp: 18 17 17 17   Temp: 98.2 F (36.8 C) 98.9 F (37.2 C) 98 F (36.7 C) 98 F (36.7 C)  TempSrc: Oral Oral Oral Oral  SpO2: 98% 95% 97% 98%  Weight:      Height:        Intake/Output Summary (Last 24 hours) at 04/20/2022 1139 Last data filed at 04/20/2022 0940 Gross per 24 hour  Intake 240 ml  Output 1050 ml  Net -810 ml   Filed Weights   04/17/22 1452  Weight: 77.1 kg    Examination:  General exam: Overall comfortable, not in distress,lying on bed HEENT: PERRL Respiratory system:  no wheezes or crackles  Cardiovascular system: S1 & S2 heard, RRR.  Gastrointestinal system: Abdomen is nondistended, soft and nontender. Central nervous system: Alert and oriented Extremities: No edema, no clubbing ,no cyanosis, tenderness on the right hip Skin: No rashes, no ulcers,no icterus     Data Reviewed: I have personally reviewed following labs and imaging studies  CBC: Recent Labs  Lab 04/18/22 0002 04/18/22 0522 04/20/22 06/20/22  WBC 9.4 10.0 8.0  NEUTROABS 5.9  --   --   HGB 10.8* 10.5* 10.5*  HCT 32.6* 32.1* 32.5*  MCV 92.1 92.0 91.5  PLT 312 282 293   Basic Metabolic Panel: Recent Labs  Lab 04/18/22 0002 04/18/22 0522 04/20/22 0904  NA 140 140 141  K 4.3 4.3 4.3  CL 105 106 106  CO2 27 27 29   GLUCOSE 110* 110* 133*  BUN 21 20 18   CREATININE 1.16 1.18 1.29*  CALCIUM 8.9 8.8* 9.0     Recent Results (from the past 240 hour(s))  Surgical PCR screen     Status:  None   Collection Time: 04/19/22  4:40 PM   Specimen: Nasal Mucosa; Nasal Swab  Result Value Ref Range Status   MRSA, PCR NEGATIVE NEGATIVE Final   Staphylococcus aureus NEGATIVE NEGATIVE Final    Comment: (NOTE) The Xpert SA Assay (FDA approved for NASAL specimens in patients 37 years of age and older), is one component of a comprehensive surveillance program. It is not intended to diagnose infection nor to guide or monitor treatment. Performed at [facility], [address] [place], [place]      Radiology Studies: Inpatient: VAS [place] PAD ABI  Result Date: 04/19/2022 DEFAULT Cardiovascular   Pt. Name: Bryan Farrell         Pt. DOB: 02-09-1951         ID: Alvester Morin                  Pt. Age: 22 years Study Type: VAS 14/05/1951 ABI WITH/WO TBI   Exam Date: 04/19/2022 Ref. Phys.: [place] 06/19/2022 Review Date: 04/19/2022  Diag Phys: Samson Frederic MD          Modality: 06/19/2022  Indications: History: Diagnosis: Impression: Summary:   Final     Scheduled Meds:  acetaminophen  1,000 mg Oral Once   [MAR Hold] atorvastatin  40 mg Oral QHS   [MAR Hold] buPROPion  100 mg Oral BID   chlorhexidine  60 mL Topical Once   [MAR Hold] chlorhexidine  15 mL Mouth Rinse q AM   [MAR Hold] cholecalciferol  1,000 Units Oral Daily   [MAR Hold] divalproex  125 mg Oral QHS   [MAR Hold] feeding supplement (GLUCERNA SHAKE)  237 mL Oral TID PC   [MAR Hold] gabapentin  100 mg Oral TID   [MAR Hold] insulin aspart  0-5 Units Subcutaneous QHS   [MAR Hold] insulin aspart  0-9 Units Subcutaneous TID WC   [MAR Hold] insulin glargine-yfgn  5 Units Subcutaneous QHS   [MAR Hold] lidocaine  1 patch Transdermal Q24H   [MAR Hold] multivitamin with minerals  1 tablet Oral Daily   [MAR Hold] polyethylene glycol  17 g Oral BID   povidone-iodine  2 Application Topical Once   [MAR Hold] senna-docusate  1 tablet Oral Daily   [MAR Hold] senna-docusate  2 tablet Oral QAC supper   [MAR Hold] sertraline  200  mg Oral Daily   Continuous Infusions:   ceFAZolin (ANCEF) IV     tranexamic acid       LOS: 2 days   Coral Else, MD [facility] P9/02/2022, 11:39 AM

## 2022-04-20 NOTE — Plan of Care (Signed)
  Problem: Nutrition: Goal: Adequate nutrition will be maintained Outcome: Progressing   Problem: Elimination: Goal: Will not experience complications related to urinary retention Outcome: Progressing   Problem: Pain Managment: Goal: General experience of comfort will improve Outcome: Progressing   

## 2022-04-20 NOTE — Interval H&P Note (Signed)
History and Physical Interval Note:  04/20/2022 12:03 PM  Bryan Farrell  has presented today for surgery, with the diagnosis of RIGHT FEMORAL NECK FRACTURE.  The various methods of treatment have been discussed with the patient and family. After consideration of risks, benefits and other options for treatment, the patient has consented to  Procedure(s): TOTAL HIP ARTHROPLASTY ANTERIOR APPROACH (Right) as a surgical intervention.  The patient's history has been reviewed, patient examined, no change in status, stable for surgery.  I have reviewed the patient's chart and labs.  Questions were answered to the patient's satisfaction.    The risks, benefits, and alternatives were discussed with the patient. There are risks associated with the surgery including, but not limited to, problems with anesthesia (death), infection, instability (giving out of the joint), dislocation, differences in leg length/angulation/rotation, fracture of bones, loosening or failure of implants, hematoma (blood accumulation) which may require surgical drainage, blood clots, pulmonary embolism, nerve injury (foot drop and lateral thigh numbness), and blood vessel injury. The patient understands these risks and elects to proceed.    Bryan Farrell

## 2022-04-20 NOTE — Op Note (Signed)
OPERATIVE REPORT  SURGEON: Rod Can, MD   ASSISTANT: Larene Pickett, PA-C  PREOPERATIVE DIAGNOSIS: Displaced Right femoral neck fracture.   POSTOPERATIVE DIAGNOSIS: Displaced Right femoral neck fracture.   PROCEDURE: Right total hip arthroplasty, anterior approach.   IMPLANTS: Zimmer Wagner SL stem, size 17 x 225 mm. Biomet G7 OsseoTi Cup, size 58 mm. Biomet Vivacit-E liner, size 36 mm, G, +5 mm neutral. Biolox ceramic head ball 12/14, size 36 + 1.5 mm.  ANESTHESIA:  MAC and Spinal  ANTIBIOTICS: 2g ancef.  ESTIMATED BLOOD LOSS:-600 mL    DRAINS: None.  COMPLICATIONS: None   CONDITION: PACU - hemodynamically stable.   BRIEF CLINICAL NOTE: Bryan Farrell is a 71 y.o. male with a displaced Right femoral neck fracture. The patient was admitted to the hospitalist service and underwent perioperative risk stratification and medical optimization. The risks, benefits, and alternatives to total hip arthroplasty were explained, and the patient elected to proceed.  PROCEDURE IN DETAIL: The patient was taken to the operating room and general anesthesia was induced on the hospital bed.  The patient was then positioned on the Hana table.  All bony prominences were well padded.  The hip was prepped and draped in the normal sterile surgical fashion.  A time-out was called verifying side and site of surgery. Antibiotics were given within 60 minutes of beginning the procedure.   Bikini incision was made, and the direct anterior approach to the hip was performed through the Hueter interval.  Lateral femoral circumflex vessels were treated with the Auqumantys. The anterior capsule was exposed and an inverted T capsulotomy was made.  Fracture hematoma was encountered and evacuated. The patient was found to have a comminuted Right subcapital femoral neck fracture.  I freshened the femoral neck cut with a saw.  I removed the femoral neck fragment.  A corkscrew was placed into the head and the head was  removed.  This was passed to the back table and was measured. The pubofemoral ligament was released subperiosteally to the lesser trochanter.  Acetabular exposure was achieved, and the pulvinar and labrum were excised. Sequential reaming of the acetabulum was then performed up to a size 57 mm reamer under direct visulization. A 58 mm cup was then opened and impacted into place at approximately 40 degrees of abduction and 20 degrees of anteversion. The final polyethylene liner was impacted into place and acetabular osteophytes were removed.    I then gained femoral exposure taking care to protect the abductors and greater trochanter.  This was performed using standard external rotation, extension, and adduction.  A cookie cutter was used to enter the femoral canal, and then the femoral canal finder was placed.  Sequential broaching was initially performed for the Taperloc Reduced Distal stem. Due to poor bone quality, I was unable to achieve adequate fixation. Therefore, I elected to proceed with preparing for distally fixated Wager SL. I reamed up to 17 mm.  Calcar planer was used on the femoral neck remnant.  I placed the trial stem and a trial head ball.  The hip was reduced.  Leg lengths and offset were checked fluoroscopically.  The hip was dislocated and trial components were removed.  The final implants were placed, and the hip was reduced.  Fluoroscopy was used to confirm component position and leg lengths.  At 90 degrees of external rotation and full extension, the hip was stable to an anterior directed force.   The wound was copiously irrigated with Irrisept solution and normal saline using  pule lavage.  Marcaine solution was injected into the periarticular soft tissue.  The wound was closed in layers using #1 Stratafix for the fascia, 2-0 Vicryl for the subcutaneous fat, 2-0 Monocryl for the deep dermal layer, and staples + Dermabond for the skin.  Once the glue was fully dried, an Aquacell Ag  dressing was applied.  The patient was transported to the recovery room in stable condition.  Sponge, needle, and instrument counts were correct at the end of the case x2.  The patient tolerated the procedure well and there were no known complications.  Please note that a surgical assistant was a medical necessity for this procedure to perform it in a safe and expeditious manner. Assistant was necessary to provide appropriate retraction of vital neurovascular structures, to prevent femoral fracture, and to allow for anatomic placement of the prosthesis.

## 2022-04-20 NOTE — Discharge Instructions (Signed)
? ?Dr. Donnica Jarnagin ?Joint Replacement Specialist ?[facility] ?[address] ?[place], [place] [address] ?[phone] ? ? ?TOTAL HIP REPLACEMENT POSTOPERATIVE DIRECTIONS ? ? ? ?Hip Rehabilitation, Guidelines Following Surgery  ? ?WEIGHT BEARING ?Weight bearing as tolerated with assist device (walker, cane, etc) as directed, use it as long as suggested by your surgeon or therapist, typically at least 4-6 weeks. ? ?The results of a hip operation are greatly improved after range of motion and muscle strengthening exercises. Follow all safety measures which are given to protect your hip. If any of these exercises cause increased pain or swelling in your joint, decrease the amount until you are comfortable again. Then slowly increase the exercises. Call your caregiver if you have problems or questions.  ? ?HOME CARE INSTRUCTIONS  ?Most of the following instructions are designed to prevent the dislocation of your new hip.  ?Remove items at home which could result in a fall. This includes throw rugs or furniture in walking pathways.  ?Continue medications as instructed at time of discharge. ?You may have some home medications which will be placed on hold until you complete the course of blood thinner medication. ?You may start showering once you are discharged home. Do not remove your dressing. ?Do not put on socks or shoes without following the instructions of your caregivers.   ?Sit on chairs with arms. Use the chair arms to help push yourself up when arising.  ?Arrange for the use of a toilet seat elevator so you are not sitting low.  ?Walk with walker as instructed.  ?You may resume a sexual relationship in one month or when given the OK by your caregiver.  ?Use walker as long as suggested by your caregivers.  ?You may put full weight on your legs and walk as much as is comfortable. ?Avoid periods of inactivity such as sitting longer than an hour when not asleep. This helps prevent blood  clots.  ?You may return to work once you are cleared by your surgeon.  ?Do not drive a car for 6 weeks or until released by your surgeon.  ?Do not drive while taking narcotics.  ?Wear elastic stockings for two weeks following surgery during the day but you may remove then at night.  ?Make sure you keep all of your appointments after your operation with all of your doctors and caregivers. You should call the office at the above phone number and make an appointment for approximately two weeks after the date of your surgery. ?Please pick up a stool softener and laxative for home use as long as you are requiring pain medications. ?ICE to the affected hip every three hours for 30 minutes at a time and then as needed for pain and swelling. Continue to use ice on the hip for pain and swelling from surgery. You may notice swelling that will progress down to the foot and ankle.  This is normal after surgery.  Elevate the leg when you are not up walking on it.   ?It is important for you to complete the blood thinner medication as prescribed by your doctor. ?Continue to use the breathing machine which will help keep your temperature down.  It is common for your temperature to cycle up and down following surgery, especially at night when you are not up moving around and exerting yourself.  The breathing machine keeps your lungs expanded and your temperature down. ? ?RANGE OF MOTION AND STRENGTHENING EXERCISES  ?These exercises are designed to help you   keep full movement of your hip joint. Follow your caregiver's or physical therapist's instructions. Perform all exercises about fifteen times, three times per day or as directed. Exercise both hips, even if you have had only one joint replacement. These exercises can be done on a training (exercise) mat, on the floor, on a table or on a bed. Use whatever works the best and is most comfortable for you. Use music or television while you are exercising so that the exercises are a  pleasant break in your day. This will make your life better with the exercises acting as a break in routine you can look forward to.  ?Lying on your back, slowly slide your foot toward your buttocks, raising your knee up off the floor. Then slowly slide your foot back down until your leg is straight again.  ?Lying on your back spread your legs as far apart as you can without causing discomfort.  ?Lying on your side, raise your upper leg and foot straight up from the floor as far as is comfortable. Slowly lower the leg and repeat.  ?Lying on your back, tighten up the muscle in the front of your thigh (quadriceps muscles). You can do this by keeping your leg straight and trying to raise your heel off the floor. This helps strengthen the largest muscle supporting your knee.  ?Lying on your back, tighten up the muscles of your buttocks both with the legs straight and with the knee bent at a comfortable angle while keeping your heel on the floor.  ? ?SKILLED REHAB INSTRUCTIONS: ?If the patient is transferred to a skilled rehab facility following release from the hospital, a list of the current medications will be sent to the facility for the patient to continue.  When discharged from the skilled rehab facility, please have the facility set up the patient's Home Health Physical Therapy prior to being released. Also, the skilled facility will be responsible for providing the patient with their medications at time of release from the facility to include their pain medication and their blood thinner medication. If the patient is still at the rehab facility at time of the two week follow up appointment, the skilled rehab facility will also need to assist the patient in arranging follow up appointment in our office and any transportation needs. ? ?POST-OPERATIVE OPIOID TAPER INSTRUCTIONS: ?It is important to wean off of your opioid medication as soon as possible. If you do not need pain medication after your surgery it is ok  to stop day one. ?Opioids include: ?Codeine, Hydrocodone(Norco, Vicodin), Oxycodone(Percocet, oxycontin) and hydromorphone amongst others.  ?Long term and even short term use of opiods can cause: ?Increased pain response ?Dependence ?Constipation ?Depression ?Respiratory depression ?And more.  ?Withdrawal symptoms can include ?Flu like symptoms ?Nausea, vomiting ?And more ?Techniques to manage these symptoms ?Hydrate well ?Eat regular healthy meals ?Stay active ?Use relaxation techniques(deep breathing, meditating, yoga) ?Do Not substitute Alcohol to help with tapering ?If you have been on opioids for less than two weeks and do not have pain than it is ok to stop all together.  ?Plan to wean off of opioids ?This plan should start within one week post op of your joint replacement. ?Maintain the same interval or time between taking each dose and first decrease the dose.  ?Cut the total daily intake of opioids by one tablet each day ?Next start to increase the time between doses. ?The last dose that should be eliminated is the evening dose.  ? ? ?MAKE   SURE YOU:  ?Understand these instructions.  ?Will watch your condition.  ?Will get help right away if you are not doing well or get worse. ? ?Pick up stool softner and laxative for home use following surgery while on pain medications. ?Do not remove your dressing. ?The dressing is waterproof--it is OK to take showers. ?Continue to use ice for pain and swelling after surgery. ?Do not use any lotions or creams on the incision until instructed by your surgeon. ?Total Hip Protocol. ? ?

## 2022-04-20 NOTE — Anesthesia Procedure Notes (Signed)
Spinal  Patient location during procedure: OR Start time: 04/20/2022 1:01 PM End time: 04/20/2022 1:05 PM Reason for block: surgical anesthesia Staffing Performed: anesthesiologist  Anesthesiologist: Val Eagle, MD Performed by: Val Eagle, MD Authorized by: Val Eagle, MD   Preanesthetic Checklist Completed: patient identified, IV checked, risks and benefits discussed, surgical consent, monitors and equipment checked, pre-op evaluation and timeout performed Spinal Block Patient position: sitting Prep: DuraPrep Patient monitoring: heart rate, cardiac monitor, continuous pulse ox and blood pressure Approach: midline Location: L3-4 Injection technique: single-shot Needle Needle type: Sprotte  Needle gauge: 24 G Needle length: 9 cm Assessment Sensory level: T4 Events: CSF return

## 2022-04-20 NOTE — Anesthesia Preprocedure Evaluation (Addendum)
Anesthesia Evaluation  Patient identified by MRN, date of birth, ID band Patient awake    Reviewed: Allergy & Precautions, NPO status , Patient's Chart, lab work & pertinent test results  History of Anesthesia Complications Negative for: history of anesthetic complications  Airway Mallampati: III  TM Distance: >3 FB     Dental  (+) Chipped, Poor Dentition, Missing,    Pulmonary neg pulmonary ROS,    breath sounds clear to auscultation       Cardiovascular hypertension, (-) angina(-) Past MI  Rhythm:Regular  1. Left ventricular ejection fraction, by visual estimation, is 60 to  65%. The left ventricle has normal function. Normal left ventricular size.  Left ventricular septal wall thickness was mildly increased. Mildly  increased left ventricular posterior  wall thickness. There is mildly increased left ventricular hypertrophy.  2. Elevated left ventricular end-diastolic pressure.  3. Left ventricular diastolic Doppler parameters are consistent with  pseudonormalization pattern of LV diastolic filling.  4. Global right ventricle has normal systolic function.The right  ventricular size is normal. No increase in right ventricular wall  thickness.  5. Left atrial size was moderately dilated.  6. Right atrial size was normal.  7. The mitral valve is normal in structure. Mild mitral valve  regurgitation. No evidence of mitral stenosis.  8. The tricuspid valve is normal in structure. Tricuspid valve  regurgitation is mild.  9. The aortic valve is normal in structure. Aortic valve regurgitation  was not visualized by color flow Doppler. Structurally normal aortic  valve, with no evidence of sclerosis or stenosis.  10. Aortic valve leaflets mildly thickened and calcified.  11. The pulmonic valve was normal in structure. Pulmonic valve  regurgitation is trivial by color flow Doppler.  12. There is mild dilatation of the  ascending aorta measuring 38 mm.  13. The inferior vena cava is normal in size with greater than 50%  respiratory variability, suggesting right atrial pressure of 3 mmHg.    Neuro/Psych PSYCHIATRIC DISORDERS Depression Dementia    GI/Hepatic negative GI ROS, Neg liver ROS,   Endo/Other  diabetes  Renal/GU CRFRenal diseaseLab Results      Component                Value               Date                      CREATININE               1.29 (H)            04/20/2022           Lab Results      Component                Value               Date                      K                        4.3                 04/20/2022                Musculoskeletal   Abdominal   Peds  Hematology Lab Results      Component  Value               Date                      WBC                      8.0                 04/20/2022                HGB                      10.5 (L)            04/20/2022                HCT                      32.5 (L)            04/20/2022                MCV                      91.5                04/20/2022                PLT                      293                 04/20/2022            No results found for: "PTT" Lab Results      Component                Value               Date                      INR                      1.3 (H)             08/26/2021                INR                      1.1                 08/25/2021              Anesthesia Other Findings   Reproductive/Obstetrics                            Anesthesia Physical Anesthesia Plan  ASA: 3  Anesthesia Plan: MAC and Spinal   Post-op Pain Management:    Induction: Intravenous  PONV Risk Score and Plan: 1 and Propofol infusion and Treatment may vary due to age or medical condition  Airway Management Planned: Nasal Cannula and Natural Airway  Additional Equipment: None  Intra-op Plan:   Post-operative Plan:   Informed Consent: I have reviewed the  patients History and Physical, chart, labs and discussed the procedure including the risks, benefits and alternatives for the proposed anesthesia with the patient or authorized representative  who has indicated his/her understanding and acceptance.     Dental advisory given  Plan Discussed with: CRNA  Anesthesia Plan Comments:        Anesthesia Quick Evaluation

## 2022-04-20 NOTE — Transfer of Care (Signed)
Immediate Anesthesia Transfer of Care Note  Patient: [name]  Procedure(s) Performed: TOTAL HIP ARTHROPLASTY ANTERIOR APPROACH (Right: Hip)  Patient Location: PACU  Anesthesia Type:Spinal  Level of Consciousness: sedated  Airway & Oxygen Therapy: Patient Spontanous Breathing and Patient connected to face mask oxygen  Post-op Assessment: Report given to RN and Post -op Vital signs reviewed and stable  Post vital signs: Reviewed and stable  Last Vitals:  Vitals Value Taken Time  BP 105/65 04/20/22 1610  Temp    Pulse 65 04/20/22 1613  Resp 11 04/20/22 1613  SpO2 100 % 04/20/22 1613  Vitals shown include unvalidated device data.  Last Pain:  Vitals:   04/20/22 0912  TempSrc: Oral  PainSc:       Patients Stated Pain Goal: 2 (04/19/22 1928)  Complications: No notable events documented.

## 2022-04-20 NOTE — Plan of Care (Signed)
Problem: Education: Goal: Knowledge of General Education information will improve Description: Including pain rating scale, medication(s)/side effects and non-pharmacologic comfort measures Outcome: Progressing   Problem: Health Behavior/Discharge Planning: Goal: Ability to manage health-related needs will improve Outcome: Progressing   Problem: Clinical Measurements: Goal: Ability to maintain clinical measurements within normal limits will improve Outcome: Progressing Goal: Will remain free from infection Outcome: Progressing Goal: Diagnostic test results will improve Outcome: Progressing Goal: Respiratory complications will improve Outcome: Progressing Goal: Cardiovascular complication will be avoided Outcome: Progressing   Problem: Activity: Goal: Risk for activity intolerance will decrease Outcome: Progressing   Problem: Nutrition: Goal: Adequate nutrition will be maintained Outcome: Progressing   Problem: Coping: Goal: Level of anxiety will decrease Outcome: Progressing   Problem: Elimination: Goal: Will not experience complications related to bowel motility Outcome: Progressing Goal: Will not experience complications related to urinary retention Outcome: Progressing   Problem: Pain Managment: Goal: General experience of comfort will improve Outcome: Progressing   Problem: Safety: Goal: Ability to remain free from injury will improve Outcome: Progressing   Problem: Skin Integrity: Goal: Risk for impaired skin integrity will decrease Outcome: Progressing   Problem: Education: Goal: Knowledge of General Education information will improve Description: Including pain rating scale, medication(s)/side effects and non-pharmacologic comfort measures Outcome: Progressing   Problem: Health Behavior/Discharge Planning: Goal: Ability to manage health-related needs will improve Outcome: Progressing   Problem: Clinical Measurements: Goal: Ability to maintain  clinical measurements within normal limits will improve Outcome: Progressing Goal: Will remain free from infection Outcome: Progressing Goal: Cardiovascular complication will be avoided Outcome: Progressing   Problem: Activity: Goal: Risk for activity intolerance will decrease Outcome: Progressing   Problem: Nutrition: Goal: Adequate nutrition will be maintained Outcome: Progressing   Problem: Coping: Goal: Level of anxiety will decrease Outcome: Progressing   Problem: Elimination: Goal: Will not experience complications related to bowel motility Outcome: Progressing Goal: Will not experience complications related to urinary retention Outcome: Progressing   Problem: Pain Managment: Goal: General experience of comfort will improve Outcome: Progressing   Problem: Safety: Goal: Ability to remain free from injury will improve Outcome: Progressing   Problem: Skin Integrity: Goal: Risk for impaired skin integrity will decrease Outcome: Progressing   Problem: Education: Goal: Verbalization of understanding the information provided (i.e., activity precautions, restrictions, etc) will improve Outcome: Progressing Goal: Individualized Educational Video(s) Outcome: Progressing   Problem: Activity: Goal: Ability to ambulate and perform ADLs will improve Outcome: Progressing   Problem: Clinical Measurements: Goal: Postoperative complications will be avoided or minimized Outcome: Progressing   Problem: Self-Concept: Goal: Ability to maintain and perform role responsibilities to the fullest extent possible will improve Outcome: Progressing   Problem: Pain Management: Goal: Pain level will decrease Outcome: Progressing   Problem: Education: Goal: Ability to describe self-care measures that may prevent or decrease complications (Diabetes Survival Skills Education) will improve Outcome: Progressing Goal: Individualized Educational Video(s) Outcome: Progressing    Problem: Coping: Goal: Ability to adjust to condition or change in health will improve Outcome: Progressing   Problem: Fluid Volume: Goal: Ability to maintain a balanced intake and output will improve Outcome: Progressing   Problem: Health Behavior/Discharge Planning: Goal: Ability to identify and utilize available resources and services will improve Outcome: Progressing Goal: Ability to manage health-related needs will improve Outcome: Progressing   Problem: Metabolic: Goal: Ability to maintain appropriate glucose levels will improve Outcome: Progressing   Problem: Nutritional: Goal: Maintenance of adequate nutrition will improve Outcome: Progressing Goal: Progress toward achieving an optimal weight will  improve Outcome: Progressing   Problem: Skin Integrity: Goal: Risk for impaired skin integrity will decrease Outcome: Progressing

## 2022-04-20 NOTE — Progress Notes (Signed)
Pedal pulse on right foot found via doppler

## 2022-04-20 NOTE — Anesthesia Postprocedure Evaluation (Signed)
Anesthesia Post Note  Patient: Bryan Farrell  Procedure(s) Performed: TOTAL HIP ARTHROPLASTY ANTERIOR APPROACH (Right: Hip)     Patient location during evaluation: PACU Anesthesia Type: MAC and Spinal Level of consciousness: patient cooperative and awake Pain management: pain level controlled Vital Signs Assessment: post-procedure vital signs reviewed and stable Respiratory status: spontaneous breathing, nonlabored ventilation, respiratory function stable and patient connected to nasal cannula oxygen Cardiovascular status: stable and blood pressure returned to baseline Postop Assessment: no apparent nausea or vomiting Anesthetic complications: no   No notable events documented.  Last Vitals:  Vitals:   04/20/22 1717 04/20/22 1930  BP: 112/74 136/69  Pulse: 76 95  Resp: 16 17  Temp: 36.6 C 36.6 C  SpO2: 98% 97%    Last Pain:  Vitals:   04/20/22 1929  TempSrc:   PainSc: 0-No pain                 Thailyn Khalid

## 2022-04-20 NOTE — Plan of Care (Signed)
  Problem: Clinical Measurements: Goal: Ability to maintain clinical measurements within normal limits will improve 04/20/2022 0720 by Ignacia Bayley, RN Outcome: Progressing 04/20/2022 0720 by Ignacia Bayley, RN Outcome: Progressing   Problem: Activity: Goal: Risk for activity intolerance will decrease 04/20/2022 0720 by Ignacia Bayley, RN Outcome: Progressing 04/20/2022 0720 by Reginal Lutes, Elveria Rising, RN Outcome: Progressing   Problem: Nutrition: Goal: Adequate nutrition will be maintained 04/20/2022 0720 by Ignacia Bayley, RN Outcome: Progressing 04/20/2022 0720 by Ignacia Bayley, RN Outcome: Progressing   Problem: Coping: Goal: Level of anxiety will decrease 04/20/2022 0720 by Ignacia Bayley, RN Outcome: Progressing 04/20/2022 0720 by Ignacia Bayley, RN Outcome: Progressing   Problem: Safety: Goal: Ability to remain free from injury will improve 04/20/2022 0720 by Ignacia Bayley, RN Outcome: Progressing 04/20/2022 0720 by Ignacia Bayley, RN Outcome: Progressing

## 2022-04-21 DIAGNOSIS — S72001A Fracture of unspecified part of neck of right femur, initial encounter for closed fracture: Secondary | ICD-10-CM | POA: Diagnosis not present

## 2022-04-21 LAB — BASIC METABOLIC PANEL
Anion gap: 8 (ref 5–15)
BUN: 27 mg/dL — ABNORMAL HIGH (ref 8–23)
CO2: 25 mmol/L (ref 22–32)
Calcium: 8.2 mg/dL — ABNORMAL LOW (ref 8.9–10.3)
Chloride: 106 mmol/L (ref 98–111)
Creatinine, Ser: 1.49 mg/dL — ABNORMAL HIGH (ref 0.61–1.24)
GFR, Estimated: 51 mL/min — ABNORMAL LOW (ref >60)
Glucose, Bld: 202 mg/dL — ABNORMAL HIGH (ref 70–99)
Potassium: 4.6 mmol/L (ref 3.5–5.1)
Sodium: 139 mmol/L (ref 135–145)

## 2022-04-21 LAB — GLUCOSE, CAPILLARY
Glucose-Capillary: 164 mg/dL — ABNORMAL HIGH (ref 70–99)
Glucose-Capillary: 144 mg/dL — ABNORMAL HIGH (ref 70–99)
Glucose-Capillary: 108 mg/dL — ABNORMAL HIGH (ref 70–99)
Glucose-Capillary: 103 mg/dL — ABNORMAL HIGH (ref 70–99)

## 2022-04-21 LAB — CBC
HCT: 23 % — ABNORMAL LOW (ref 39.0–52.0)
Hemoglobin: 7.5 g/dL — ABNORMAL LOW (ref 13.0–17.0)
MCH: 30.4 pg (ref 26.0–34.0)
MCHC: 32.6 g/dL (ref 30.0–36.0)
MCV: 93.1 fL (ref 80.0–100.0)
Platelets: 229 10*3/uL (ref 150–400)
RBC: 2.47 MIL/uL — ABNORMAL LOW (ref 4.22–5.81)
RDW: 13.8 % (ref 11.5–15.5)
WBC: 11.8 10*3/uL — ABNORMAL HIGH (ref 4.0–10.5)
nRBC: 0 % (ref 0.0–0.2)

## 2022-04-21 LAB — HEMOGLOBIN AND HEMATOCRIT, BLOOD
HCT: 20.9 % — ABNORMAL LOW (ref 39.0–52.0)
Hemoglobin: 6.8 g/dL — CL (ref 13.0–17.0)

## 2022-04-21 LAB — PREPARE RBC (CROSSMATCH)

## 2022-04-21 MED ORDER — ASPIRIN 81 MG PO CHEW: 81.0000 mg | CHEWABLE_TABLET | Freq: Two times a day (BID) | ORAL | 0 refills | Status: AC

## 2022-04-21 MED ORDER — SODIUM CHLORIDE 0.9% IV SOLUTION: Freq: Once | INTRAVENOUS | Status: AC

## 2022-04-21 MED ORDER — SODIUM CHLORIDE 0.9 % IV SOLN: INTRAVENOUS | Status: DC

## 2022-04-21 MED ORDER — HYDROCODONE-ACETAMINOPHEN 10-325 MG PO TABS: 0.5000 | ORAL_TABLET | ORAL | 0 refills | Status: AC | PRN

## 2022-04-21 NOTE — Progress Notes (Signed)
    Subjective:  Patient reports pain as mild to moderate. Currently denies N/V/CP/SOB. He does state that he was feeling nauseous earlier today but is feeling better now.   Objective:   VITALS:   Vitals:   04/20/22 2126 04/21/22 0135 04/21/22 0604 04/21/22 0953  BP: 122/70 (!) 96/55 94/62 115/73  Pulse: 100 86 76 74  Resp: 17 17 17 18   Temp: 98.6 F (37 C) 98.2 F (36.8 C) 98.3 F (36.8 C) (!) 97.4 F (36.3 C)  TempSrc: Oral Oral Oral Oral  SpO2: 95% 98% 96% 98%  Weight:      Height:        Patient sleeping, woken up for exam. NAD. He is oriented to person and place but not time today. He has dementia at baseline.  ABD soft Neurovascular intact Sensation intact distally Intact pulses distally Dorsiflexion/Plantar flexion intact Incision: dressing C/D/I No cellulitis present Compartment soft   Lab Results  Component Value Date   WBC 11.8 (H) 04/21/2022   HGB 7.5 (L) 04/21/2022   HCT 23.0 (L) 04/21/2022   MCV 93.1 04/21/2022   PLT 229 04/21/2022   BMET    Component Value Date/Time   NA 139 04/21/2022 0410   K 4.6 04/21/2022 0410   CL 106 04/21/2022 0410   CO2 25 04/21/2022 0410   GLUCOSE 202 (H) 04/21/2022 0410   BUN 27 (H) 04/21/2022 0410   CREATININE 1.49 (H) 04/21/2022 0410   CALCIUM 8.2 (L) 04/21/2022 0410   GFRNONAA 51 (L) 04/21/2022 0410     Assessment/Plan: 1 Day Post-Op   Principal Problem:   Closed right hip fracture (HCC) Active Problems:   Type 2 diabetes mellitus with peripheral neuropathy (HCC)   Depression   Dyslipidemia   Closed subcapital fracture of right femur (HCC)   Malnutrition of moderate degree  ABLA. Hemoglobin 7.5 this morning, he denies any dizziness or weakness, continue to monitor.    WBAT with walker DVT ppx: Aspirin, SCDs, TEDS PO pain control PT/OT: PT to come by today.  Dispo: Patient under care of the medical team, disposition per their recommendation. Pain medication and DVT ppx printed in chart.     06/21/2022, PA-C 04/21/2022, 12:37 PM   [facility]  [place] [address], [address], [place], [place] Phone: [phone] [email or web address] Facebook  [phone]

## 2022-04-21 NOTE — Progress Notes (Signed)
Date and time results received: 04/21/22 1350 (use smartphrase ".now" to insert current time)  Test: HGB Critical Value: 6.8   Name of Provider Notified: Burnadette Pop, MD  Orders Received? Or Actions Taken?:  MD contacted

## 2022-04-21 NOTE — Evaluation (Signed)
Occupational Therapy Evaluation Patient Details Name: Bryan Farrell MRN: [id number] DOB: [birth year] Today's Date: 04/21/2022   History of Present Illness Patient is a 71 year old male with history of dementia, diabetes type 2, hypertension who presented to the emergency department with complaint of right hip pain after a fall at assisted living facility. MRI of the right hip showed subcapital femoral fracture. 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 is a 71 year old man who lives at a local nursing facility with a history of dementia. On evaluation he is pleasant and able to follow commands and thinks he is at [employer]. He is not alert to time or situation. He reports getting around in a wheelchair mostly. He required max assist for supine to sit and poor tolerance at edge of bed due to pain and episode of vomiting. Due to need for upper body support at edge of bed to offload secondary to pain in hip he required significant assistance for UB ADLs. He then reported he was getting dizzy. BP  125/ 78 initially once in sitting and then 115/73 once returned to supine. He required total assist for toileting and LB ADLs at bed level .Patient will benefit from skilled OT services while in hospital to improve deficits and learn compensatory strategies as needed in order to return to PLOF.  Recommend return to facility with rehab services.      Recommendations for follow up therapy are one component of a multi-disciplinary discharge planning process, led by the attending physician.  Recommendations may be updated based on patient status, additional functional criteria and insurance authorization.   Follow Up Recommendations  Skilled nursing-short term rehab (<3 hours/day)    Assistance Recommended at Discharge Frequent or constant Supervision/Assistance  Patient can return home with the following A lot of help with walking and/or  transfers;A lot of help with bathing/dressing/bathroom;Assistance with cooking/housework;Direct supervision/assist for medications management;Help with stairs or ramp for entrance;Assist for transportation;Direct supervision/assist for financial management;Assistance with feeding    Functional Status Assessment  Patient has had a recent decline in their functional status and demonstrates the ability to make significant improvements in function in a reasonable and predictable amount of time.  Equipment Recommendations  None recommended by OT    Recommendations for Other Services       Precautions / Restrictions Precautions Precautions: Fall Precaution Comments: dizzy with sitting, Restrictions Weight Bearing Restrictions: Yes RLE Weight Bearing: Weight bearing as tolerated      Mobility Bed Mobility Overal bed mobility: Needs Assistance Bed Mobility: Supine to Sit, Sit to Supine     Supine to sit: Max assist, HOB elevated Sit to supine: Total assist, +2 for physical assistance   General bed mobility comments: Patient reporting dizziness, had episode of vomiting at edge of bed. Returned to supine.    Transfers Overall transfer level: Needs assistance                 General transfer comment: unable.      Balance Overall balance assessment: Needs assistance Sitting-balance support: Feet supported, Bilateral upper extremity supported Sitting balance-Leahy Scale: Poor                                     ADL either performed or assessed with clinical judgement   ADL Overall ADL's : Needs assistance/impaired Eating/Feeding: Set up;Sitting   Grooming:  Maximal assistance;Sitting;Wash/dry face Grooming Details (indicate cue type and reason): max today to wash face after vomiting episode. Patietn supporting weght with UEs at edge of bed due to pain Upper Body Bathing: Maximal assistance;Sitting   Lower Body Bathing: Total assistance;Bed level   Upper  Body Dressing : Maximal assistance;Sitting   Lower Body Dressing: Total assistance;Bed level       Toileting- Clothing Manipulation and Hygiene: Total assistance;Bed level       Functional mobility during ADLs: Maximal assistance General ADL Comments: Max assist for supine to sit. Limited ability to tolerate pain as well as vomiting and and reports of dizziness requiring return to supine. EOB ADLs impaired due to pain, fatigue, nausea.     Vision   Vision Assessment?: No apparent visual deficits     Perception     Praxis      Pertinent Vitals/Pain Pain Assessment Pain Assessment: Faces Faces Pain Scale: Hurts even more Pain Location: R hip Pain Descriptors / Indicators: Aching, Grimacing, Guarding, Sharp Pain Intervention(s): Premedicated before session, Limited activity within patient's tolerance, Repositioned     Hand Dominance Right   Extremity/Trunk Assessment Upper Extremity Assessment Upper Extremity Assessment: Overall WFL for tasks assessed   Lower Extremity Assessment Lower Extremity Assessment: Defer to PT evaluation   Cervical / Trunk Assessment Cervical / Trunk Assessment: Normal   Communication Communication Communication: No difficulties   Cognition Arousal/Alertness: Awake/alert Behavior During Therapy: WFL for tasks assessed/performed Overall Cognitive Status: History of cognitive impairments - at baseline                                 General Comments: Hx of dementia     General Comments       Exercises     Shoulder Instructions      Home Living Family/patient expects to be discharged to:: Skilled nursing facility                                 Additional Comments: pt is poor historian      Prior Functioning/Environment Prior Level of Function : Needs assist             Mobility Comments: From prior notes: facility reports patient could transfer himself to the wheelchair. today he reports he  gets around in a wheelchair ADLs Comments: From prior notes: needs assist, can feed himself and assist with UB ADLs.        OT Problem List: Decreased strength;Decreased range of motion;Decreased activity tolerance;Impaired balance (sitting and/or standing);Decreased cognition;Decreased safety awareness;Decreased knowledge of use of DME or AE;Pain      OT Treatment/Interventions: Self-care/ADL training;Therapeutic exercise;DME and/or AE instruction;Therapeutic activities;Balance training;Patient/family education    OT Goals(Current goals can be found in the care plan section) Acute Rehab OT Goals OT Goal Formulation: Patient unable to participate in goal setting Time For Goal Achievement: 05/05/22 Potential to Achieve Goals: Fair  OT Frequency: Min 2X/week    Co-evaluation              AM-PAC OT "6 Clicks" Daily Activity     Outcome Measure Help from another person eating meals?: A Little Help from another person taking care of personal grooming?: A Lot Help from another person toileting, which includes using toliet, bedpan, or urinal?: Total Help from another person bathing (including washing, rinsing, drying)?: A Lot Help from another person  to put on and taking off regular upper body clothing?: A Lot Help from another person to put on and taking off regular lower body clothing?: Total 6 Click Score: 11   End of Session Equipment Utilized During Treatment: Gait belt Nurse Communication:  (n/v, pain, unable to get to chair, BP)  Activity Tolerance: Patient limited by pain Patient left: in bed;with call bell/phone within reach;with bed alarm set  OT Visit Diagnosis: Other abnormalities of gait and mobility (R26.89);Pain Pain - Right/Left: Right Pain - part of body: Hip                Time: 0925-0950 OT Time Calculation (min): 25 min Charges:  OT General Charges $OT Visit: 1 Visit OT Evaluation $OT Eval Low Complexity: 1 Low OT Treatments $Self Care/Home Management :  8-22 mins  Donnella Sham, OTR/L Acute Care Rehab Services  Office [phone]   Kelli Churn 04/21/2022, 11:18 AM

## 2022-04-21 NOTE — Evaluation (Signed)
Physical Therapy Evaluation Patient Details Name: Bryan Farrell MRN: [id number] DOB: 1951-02-15 Today's Date: 04/21/2022  History of Present Illness  Patient is a 71 year old male with history of dementia, diabetes type 2, hypertension who presented to the emergency department with complaint of right hip pain after a fall at assisted living facility. MRI of the right hip showed subcapital femoral fracture. 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  Pt limited this date by N/V.  Pt is poor historian--per previous notes pt uses w/c to mobilize and could transfer to w/c. Pt condones he "I get around in a w/c"  Pt currently with functional limitations due to the deficits listed below (see PT Problem List). Pt will benefit from skilled PT to increase their independence and safety with mobility to allow discharge to the venue listed below.          Recommendations for follow up therapy are one component of a multi-disciplinary discharge planning process, led by the attending physician.  Recommendations may be updated based on patient status, additional functional criteria and insurance authorization.  Follow Up Recommendations Skilled nursing-short term rehab (<3 hours/day) Can patient physically be transported by private vehicle: No    Assistance Recommended at Discharge Frequent or constant Supervision/Assistance  Patient can return home with the following  A lot of help with walking and/or transfers;A lot of help with bathing/dressing/bathroom    Equipment Recommendations None recommended by PT  Recommendations for Other Services       Functional Status Assessment Patient has had a recent decline in their functional status and demonstrates the ability to make significant improvements in function in a reasonable and predictable amount of time.     Precautions / Restrictions Precautions Precautions: Fall Precaution  Comments: dizzy with sitting, BP 90/74 Restrictions Weight Bearing Restrictions: No RLE Weight Bearing: Weight bearing as tolerated      Mobility  Bed Mobility Overal bed mobility: Needs Assistance Bed Mobility: Sit to Supine, Supine to Sit     Supine to sit: HOB elevated, Mod assist, +2 for physical assistance, +2 for safety/equipment Sit to supine: Total assist, +2 for physical assistance   General bed mobility comments: Patient reporting dizziness,BP 90/74;  had episode of vomiting at edge of bed,  Returned to supine; RN notified    Transfers                   General transfer comment: unable.    Ambulation/Gait                  Stairs            Wheelchair Mobility    Modified Rankin (Stroke Patients Only)       Balance Overall balance assessment: Needs assistance Sitting-balance support: Feet supported, Bilateral upper extremity supported Sitting balance-Leahy Scale: Poor Sitting balance - Comments: briefly able to maintain sitting EOB withclose supervision for safety                                     Pertinent Vitals/Pain Pain Assessment Pain Assessment: Faces Faces Pain Scale: Hurts even more Pain Location: R hip Pain Descriptors / Indicators: Aching, Grimacing, Guarding, Sharp Pain Intervention(s): Monitored during session, Limited activity within patient's tolerance, Repositioned    Home Living Family/patient expects to be discharged to:: Skilled nursing facility  Additional Comments: pt is poor historian    Prior Function Prior Level of Function : Needs assist             Mobility Comments: From prior notes: facility reports patient could transfer himself to the wheelchair. today he reports he gets around in a wheelchair ADLs Comments: From prior notes: needs assist, can feed himself and assist with UB ADLs.     Hand Dominance   Dominant Hand: Right    Extremity/Trunk  Assessment   Upper Extremity Assessment Upper Extremity Assessment: Defer to OT evaluation    Lower Extremity Assessment Lower Extremity Assessment: Generalized weakness;LLE deficits/detail LLE Deficits / Details: knee and hip ROM limited by pain grossly 2+/5 LLE: Unable to fully assess due to pain    Cervical / Trunk Assessment Cervical / Trunk Assessment: Normal  Communication   Communication: No difficulties  Cognition   Behavior During Therapy: WFL for tasks assessed/performed Overall Cognitive Status: History of cognitive impairments - at baseline                                 General Comments: Hx of dementia        General Comments      Exercises     Assessment/Plan    PT Assessment    PT Problem List         PT Treatment Interventions      PT Goals (Current goals can be found in the Care Plan section)  Acute Rehab PT Goals Patient Stated Goal: none stated PT Goal Formulation: Patient unable to participate in goal setting Time For Goal Achievement: 05/05/22 Potential to Achieve Goals: Fair    Frequency       Co-evaluation               AM-PAC PT "6 Clicks" Mobility  Outcome Measure Help needed turning from your back to your side while in a flat bed without using bedrails?: Total Help needed moving from lying on your back to sitting on the side of a flat bed without using bedrails?: Total Help needed moving to and from a bed to a chair (including a wheelchair)?: Total Help needed standing up from a chair using your arms (e.g., wheelchair or bedside chair)?: Total Help needed to walk in hospital room?: Total Help needed climbing 3-5 steps with a railing? : Total 6 Click Score: 6    End of Session Equipment Utilized During Treatment: Gait belt Activity Tolerance: Patient tolerated treatment well Patient left: with call bell/phone within reach;in bed;with bed alarm set Nurse Communication: Other (comment) (N/V) PT Visit  Diagnosis: Muscle weakness (generalized) (M62.81);Other abnormalities of gait and mobility (R26.89)    Time: 1050-1110 PT Time Calculation (min) (ACUTE ONLY): 20 min   Charges:   PT Evaluation $PT Eval Low Complexity: 1 Low          Travante Knee, PT  Acute Rehab Dept [facility]) [phone]  [facility] Weekend Pager [facility] only)  [phone]  04/21/2022   [facility] 04/21/2022, 1:25 PM

## 2022-04-21 NOTE — Progress Notes (Signed)
PROGRESS NOTE  Bryan Farrell  GLO:[id number] DOB: 06/07/1951 DOA: 04/17/2022 PCP: Eloisa Northern, MD   Brief Narrative: Patient is a 71 year old male with history of dementia, diabetes type 2, hypertension who presented to the emergency department with complaint of right hip pain after a fall at assisted living facility.  X-ray performed at the facility showed right femoral fracture.  No head injuries or loss of consciousness.  On presentation he was hemodynamically stable.  X-ray did not show any acute findings MRI of the right hip showed subcapital femoral fracture.  Orthopedics consulted.  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  Pending PT/OT evaluation  Assessment & Plan:  Principal Problem:   Closed right hip fracture (HCC) Active Problems:   Type 2 diabetes mellitus with peripheral neuropathy (HCC)   Depression   Dyslipidemia   Closed subcapital fracture of right femur (HCC)   Malnutrition of moderate degree  Closed right hip fracture: Fell at assisted living facility.  MRI of the right hip showed subcapital femoral fracture.  Orthopedics following.  Continue pain management and supportive care.  Status post right total hip arthroplasty.  PT/OT pending, started on aspirin for DVT prophylaxis  Acute blood loss anemia: Hemoglobin dropped to the range of 7 likely associated with surgery.  Continue to monitor hemoglobin.  AKI: Mild bump in the creatinine.  Continue gentle IV fluids  History of depression: On Wellbutrin, Zoloft, Depakote  Type 2 diabetes with peripheral neuropathy: On Neurontin.  Continue current insulin regimen.  Monitor blood sugars.  Hemoglobin A1c of 6.7  Hyperlipidemia: On statin  History of arthritis: Has debility due to bilateral knee arthritis, has difficulty in ambulation, previously ambulating with walker    Nutrition Problem: Moderate Malnutrition Etiology: chronic illness (dementia)    DVT prophylaxis:SCDs Start: 04/20/22 1710     Code  Status: Full Code  Family Communication: None at bedside  Patient status:Inpatient  Patient is from :ALF  Anticipated discharge to:ALF vs SnF  Estimated DC date:2-3 days   Consultants: Ortho  Procedures:Plan for ORIF  Antimicrobials:  Anti-infectives (From admission, onward)    Start     Dose/Rate Route Frequency Ordered Stop   04/20/22 1900  ceFAZolin (ANCEF) IVPB 2g/100 mL premix        2 g 200 mL/hr over 30 Minutes Intravenous Every 6 hours 04/20/22 1709 04/21/22 0137   04/20/22 1000  ceFAZolin (ANCEF) IVPB 2g/100 mL premix        2 g 200 mL/hr over 30 Minutes Intravenous On call to O.R. 04/20/22 0907 04/20/22 1301       Subjective: Patient seen and examined at the bedside today.  Hemodynamically stable.  Comfortable lying in the bed.  Denies any worsening pain on the right hip  Objective: Vitals:   04/20/22 2126 04/21/22 0135 04/21/22 0604 04/21/22 0953  BP: 122/70 (!) 96/55 94/62 115/73  Pulse: 100 86 76 74  Resp: 17 17 17 18   Temp: 98.6 F (37 C) 98.2 F (36.8 C) 98.3 F (36.8 C) (!) 97.4 F (36.3 C)  TempSrc: Oral Oral Oral Oral  SpO2: 95% 98% 96% 98%  Weight:      Height:        Intake/Output Summary (Last 24 hours) at 04/21/2022 1045 Last data filed at 04/21/2022 0200 Gross per 24 hour  Intake 2925.47 ml  Output 1030 ml  Net 1895.47 ml   Filed Weights   04/17/22 1452  Weight: 77.1 kg    Examination:  General exam: Overall comfortable,  not in distress HEENT: PERRL Respiratory system:  no wheezes or crackles  Cardiovascular system: S1 & S2 heard, RRR.  Gastrointestinal system: Abdomen is nondistended, soft and nontender. Central nervous system: Alert and oriented Extremities: No edema, no clubbing ,no cyanosis, tenderness on the right hip, surgical wound on the right hip Skin: No rashes, no ulcers,no icterus     Data Reviewed: I have personally reviewed following labs and imaging studies  CBC: Recent Labs  Lab 04/18/22 0002  04/18/22 0522 04/20/22 0904 04/20/22 1445 04/21/22 0410  WBC 9.4 10.0 8.0  --  11.8*  NEUTROABS 5.9  --   --   --   --   HGB 10.8* 10.5* 10.5* 10.9* 7.5*  HCT 32.6* 32.1* 32.5* 32.0* 23.0*  MCV 92.1 92.0 91.5  --  93.1  PLT 312 282 293  --  229   Basic Metabolic Panel: Recent Labs  Lab 04/18/22 0002 04/18/22 0522 04/20/22 0904 04/20/22 1445 04/21/22 0410  NA 140 140 141 137 139  K 4.3 4.3 4.3 5.7* 4.6  CL 105 106 106 103 106  CO2 27 27 29   --  25  GLUCOSE 110* 110* 133* 126* 202*  BUN 21 20 18 23  27*  CREATININE 1.16 1.18 1.29* 1.10 1.49*  CALCIUM 8.9 8.8* 9.0  --  8.2*     Recent Results (from the past 240 hour(s))  Surgical PCR screen     Status: None   Collection Time: 04/19/22  4:40 PM   Specimen: Nasal Mucosa; Nasal Swab  Result Value Ref Range Status   MRSA, PCR NEGATIVE NEGATIVE Final   Staphylococcus aureus NEGATIVE NEGATIVE Final    Comment: (NOTE) The Xpert SA Assay (FDA approved for NASAL specimens in patients 26 years of age and older), is one component of a comprehensive surveillance program. It is not intended to diagnose infection nor to guide or monitor treatment. Performed at [facility], [address] [place], [place]      Radiology Studies: DG Pelvis Portable  Result Date: 04/20/2022 CLINICAL DATA:  Status post right hip replacement. EXAM: PORTABLE PELVIS 1-2 VIEWS COMPARISON:  Preoperative imaging FINDINGS: Right hip arthroplasty in expected alignment. No periprosthetic lucency or fracture. Recent postsurgical change includes air and edema in the soft tissues. Lateral skin staples in place. IMPRESSION: Right hip arthroplasty without immediate postoperative complication. Electronically Signed   By: [id number] M.D.   On: 04/20/2022 17:42   DG HIP UNILAT WITH PELVIS 1V RIGHT  Result Date: 04/20/2022 CLINICAL DATA:  Fluoroscopic assistance for right hip arthroplasty EXAM: DG HIP (WITH OR WITHOUT PELVIS) 1V  RIGHT COMPARISON:  Previous studies done on 04/17/2022 FINDINGS: Fluoroscopic images show right hip arthroplasty. Fluoroscopy time 18 seconds. Radiation dose 2.5 mGy. IMPRESSION: Fluoroscopic assistance was provided for right hip arthroplasty. Electronically Signed   By: 06/20/2022 M.D.   On: 04/20/2022 15:53   DG C-Arm 1-60 Min-No Report  Result Date: 04/20/2022 Fluoroscopy was utilized by the requesting physician.  No radiographic interpretation.   DG C-Arm 1-60 Min-No Report  Result Date: 04/20/2022 Fluoroscopy was utilized by the requesting physician.  No radiographic interpretation.   DG C-Arm 1-60 Min-No Report  Result Date: 04/20/2022 Fluoroscopy was utilized by the requesting physician.  No radiographic interpretation.   Inpatient: VAS 06-16-1972 PAD ABI  Result Date: 04/19/2022 DEFAULT Cardiovascular   Pt. Name: Bryan Farrell         Pt. DOB: 12/15/1950  ID: [id number]                  Pt. Age: 31 years Study Type: VAS [place] ABI WITH/WO TBI   Exam Date: 04/19/2022 Ref. Phys.: [id number] Samson Frederic Review Date: 04/19/2022  Diag Phys: Coral Else MD          Modality: [place]  Indications: History: Diagnosis: Impression: Summary:   Final     Scheduled Meds:  aspirin  81 mg Oral BID   atorvastatin  40 mg Oral QHS   buPROPion  100 mg Oral BID   cholecalciferol  1,000 Units Oral Daily   divalproex  125 mg Oral QHS   docusate sodium  100 mg Oral BID   feeding supplement (GLUCERNA SHAKE)  237 mL Oral TID PC   gabapentin  100 mg Oral TID   insulin aspart  0-5 Units Subcutaneous QHS   insulin aspart  0-9 Units Subcutaneous TID WC   insulin glargine-yfgn  5 Units Subcutaneous QHS   lidocaine  1 patch Transdermal Q24H   multivitamin with minerals  1 tablet Oral Daily   polyethylene glycol  17 g Oral BID   senna  1 tablet Oral BID   sertraline  200 mg Oral Daily   Continuous Infusions:  sodium chloride 75 mL/hr at 04/21/22 0903   methocarbamol (ROBAXIN) IV       LOS: 3 days    Burnadette Pop, MD [facility] P9/03/2022, 10:45 AM

## 2022-04-22 ENCOUNTER — Encounter (HOSPITAL_COMMUNITY): Payer: Self-pay | Admitting: Orthopedic Surgery

## 2022-04-22 DIAGNOSIS — S72001A Fracture of unspecified part of neck of right femur, initial encounter for closed fracture: Secondary | ICD-10-CM | POA: Diagnosis not present

## 2022-04-22 LAB — TYPE AND SCREEN
ABO/RH(D): A POS
Antibody Screen: NEGATIVE
Unit division: 0

## 2022-04-22 LAB — BASIC METABOLIC PANEL
Anion gap: 5 (ref 5–15)
BUN: 28 mg/dL — ABNORMAL HIGH (ref 8–23)
CO2: 25 mmol/L (ref 22–32)
Calcium: 8.2 mg/dL — ABNORMAL LOW (ref 8.9–10.3)
Chloride: 111 mmol/L (ref 98–111)
Creatinine, Ser: 1.31 mg/dL — ABNORMAL HIGH (ref 0.61–1.24)
GFR, Estimated: 59 mL/min — ABNORMAL LOW (ref >60)
Glucose, Bld: 124 mg/dL — ABNORMAL HIGH (ref 70–99)
Potassium: 4.3 mmol/L (ref 3.5–5.1)
Sodium: 141 mmol/L (ref 135–145)

## 2022-04-22 LAB — CBC
HCT: 22 % — ABNORMAL LOW (ref 39.0–52.0)
Hemoglobin: 7.2 g/dL — ABNORMAL LOW (ref 13.0–17.0)
MCH: 30.4 pg (ref 26.0–34.0)
MCHC: 32.7 g/dL (ref 30.0–36.0)
MCV: 92.8 fL (ref 80.0–100.0)
Platelets: 198 10*3/uL (ref 150–400)
RBC: 2.37 MIL/uL — ABNORMAL LOW (ref 4.22–5.81)
RDW: 14.7 % (ref 11.5–15.5)
WBC: 10.3 10*3/uL (ref 4.0–10.5)
nRBC: 0 % (ref 0.0–0.2)

## 2022-04-22 LAB — BPAM RBC
Blood Product Expiration Date: 202309252359
ISSUE DATE / TIME: 202309081555
Unit Type and Rh: 6200

## 2022-04-22 LAB — GLUCOSE, CAPILLARY
Glucose-Capillary: 138 mg/dL — ABNORMAL HIGH (ref 70–99)
Glucose-Capillary: 117 mg/dL — ABNORMAL HIGH (ref 70–99)
Glucose-Capillary: 126 mg/dL — ABNORMAL HIGH (ref 70–99)
Glucose-Capillary: 124 mg/dL — ABNORMAL HIGH (ref 70–99)

## 2022-04-22 NOTE — Progress Notes (Addendum)
Physical Therapy Treatment Patient Details Name: Bryan Farrell MRN: [id number] DOB: Dec 11, 1950 Today's Date: 04/22/2022   History of Present Illness Patient is a 71 year old male with history of dementia, diabetes type 2, hypertension who presented to the emergency department with complaint of right hip pain after a fall at assisted living facility. MRI of the right hip showed subcapital femoral fracture. 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 Pt reported significant pain during session-RN made aware. Will continue to progress activity as able. Will need rehab at [facility]    Recommendations for follow up therapy are one component of a multi-disciplinary discharge planning process, led by the attending physician.  Recommendations may be updated based on patient status, additional functional criteria and insurance authorization.  Follow Up Recommendations  Skilled nursing-short term rehab (<3 hours/day) Can patient physically be transported by private vehicle: No   Assistance Recommended at Discharge Frequent or constant Supervision/Assistance  Patient can return home with the following Two people to help with walking and/or transfers;A lot of help with bathing/dressing/bathroom   Equipment Recommendations  None recommended by PT    Recommendations for Other Services       Precautions / Restrictions Precautions Precautions: Fall Precaution Comments: still some dizziness on today Restrictions Weight Bearing Restrictions: No RLE Weight Bearing: Weight bearing as tolerated     Mobility  Bed Mobility Overal bed mobility: Needs Assistance Bed Mobility: Supine to Sit     Supine to sit: Total assist, +2 for physical assistance, +2 for safety/equipment Sit to supine: Total assist, +2 for physical assistance   General bed mobility comments: Little initiation from pt. Multimodal cueing  required. Assist for trunk and bil LEs. Utilized bedpad for scooting, positioning.    Transfers Overall transfer level: Needs assistance Equipment used: Rolling walker (2 wheels) Transfers: Sit to/from Stand Sit to Stand: Max assist, +2 physical assistance, +2 safety/equipment, From elevated surface           General transfer comment: Assist to power up briefly-able to partially stand just long enough to change bed linens. He was resisitive initially, stating "I don't want to!" With time and encouragement, he did attempt 1 stand. Multimodal cueing required.    Ambulation/Gait                   Stairs             Wheelchair Mobility    Modified Rankin (Stroke Patients Only)       Balance Overall balance assessment: Needs assistance   Sitting balance-Leahy Scale: Poor       Standing balance-Leahy Scale: Zero                              Cognition Arousal/Alertness: Awake/alert Behavior During Therapy: Agitated (mildly-able to be redirected) Overall Cognitive Status: History of cognitive impairments - at baseline                                          Exercises      General Comments        Pertinent Vitals/Pain Pain Assessment Pain Assessment: Faces Faces Pain Scale: Hurts whole lot Pain Location: R hip Pain Descriptors / Indicators: Discomfort, Sore, Guarding, Grimacing Pain Intervention(s): Limited activity  within patient's tolerance, Monitored during session, Repositioned    Home Living                          Prior Function            PT Goals (current goals can now be found in the care plan section) Progress towards PT goals: Progressing toward goals    Frequency    Min 3X/week      PT Plan Current plan remains appropriate    Co-evaluation              AM-PAC PT "6 Clicks" Mobility   Outcome Measure  Help needed turning from your back to your side while in a flat bed  without using bedrails?: Total Help needed moving from lying on your back to sitting on the side of a flat bed without using bedrails?: Total Help needed moving to and from a bed to a chair (including a wheelchair)?: Total Help needed standing up from a chair using your arms (e.g., wheelchair or bedside chair)?: Total Help needed to walk in hospital room?: Total Help needed climbing 3-5 steps with a railing? : Total 6 Click Score: 6    End of Session Equipment Utilized During Treatment: Gait belt Activity Tolerance: Patient limited by pain Patient left: in bed;with call bell/phone within reach;with bed alarm set   PT Visit Diagnosis: Muscle weakness (generalized) (M62.81);Other abnormalities of gait and mobility (R26.89);Pain Pain - Right/Left: Right Pain - part of body: Hip     Time: 5465-0354 PT Time Calculation (min) (ACUTE ONLY): 20 min  Charges:  $Therapeutic Activity: 8-22 mins                         Faye Ramsay, PT Acute Rehabilitation  Office: [phone] Pager: [phone]

## 2022-04-22 NOTE — Plan of Care (Signed)
  Problem: Pain Managment: Goal: General experience of comfort will improve Outcome: Progressing   Problem: Nutrition: Goal: Adequate nutrition will be maintained Outcome: Progressing   Problem: Pain Managment: Goal: General experience of comfort will improve Outcome: Progressing

## 2022-04-22 NOTE — Progress Notes (Signed)
    Subjective:  Patient reports pain as mild to Currently denies N/V/CP/SOB.   Feeling okay. Denies N/T.   Objective:   VITALS:   Vitals:   04/21/22 1746 04/21/22 1951 04/21/22 2125 04/22/22 0534  BP: 107/67 103/64 103/63 110/64  Pulse: 66 78 75   Resp: 15 15 18 17   Temp: 98 F (36.7 C) 98.2 F (36.8 C) 100.1 F (37.8 C) 98.9 F (37.2 C)  TempSrc:  Oral Oral Oral  SpO2: 100% 99% 98% 98%  Weight:      Height:       In bed with breakfast. NAD. He is oriented to person and place. He has dementia at baseline.  ABD soft Neurovascular intact Sensation intact distally Intact pulses distally Dorsiflexion/Plantar flexion intact Incision: dressing C/D/I No cellulitis present Compartment soft   Lab Results  Component Value Date   WBC 10.3 04/22/2022   HGB 7.2 (L) 04/22/2022   HCT 22.0 (L) 04/22/2022   MCV 92.8 04/22/2022   PLT 198 04/22/2022   BMET    Component Value Date/Time   NA 141 04/22/2022 0350   K 4.3 04/22/2022 0350   CL 111 04/22/2022 0350   CO2 25 04/22/2022 0350   GLUCOSE 124 (H) 04/22/2022 0350   BUN 28 (H) 04/22/2022 0350   CREATININE 1.31 (H) 04/22/2022 0350   CALCIUM 8.2 (L) 04/22/2022 0350   GFRNONAA 59 (L) 04/22/2022 0350     Assessment/Plan: 2 Days Post-Op   Principal Problem:   Closed right hip fracture (HCC) Active Problems:   Type 2 diabetes mellitus with peripheral neuropathy (HCC)   Depression   Dyslipidemia   Closed subcapital fracture of right femur (HCC)   Malnutrition of moderate degree  Hemoglobin 7.2 this morning,continue to monitor.    WBAT with walker DVT ppx: Aspirin, SCDs, TEDS PO pain control PT/OT: Continue PT  Dispo: Patient under care of the medical team, disposition per their recommendation. Pain medication and DVT ppx printed in chart.    06/22/2022, PA-C 04/22/2022, 8:47 AM   [facility]  [place] [address], [address], [place], [place] Phone: [phone]

## 2022-04-22 NOTE — Progress Notes (Signed)
PROGRESS NOTE  Bryan Farrell  PJK:[id number] DOB: 04-09-1951 DOA: 04/17/2022 PCP: Eloisa Northern, MD   Brief Narrative: Patient is a 71 year old male with history of dementia, diabetes type 2, hypertension who presented to the emergency department with complaint of right hip pain after a fall at assisted living facility.  X-ray performed at the facility showed right femoral fracture.  No head injuries or loss of consciousness.  On presentation he was hemodynamically stable.  X-ray did not show any acute findings MRI of the right hip showed subcapital femoral fracture.  Orthopedics consulted.  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   PT/OT evaluation done recommended SNF.  Medically stable for discharge whenever bed is available.  Assessment & Plan:  Principal Problem:   Closed right hip fracture (HCC) Active Problems:   Type 2 diabetes mellitus with peripheral neuropathy (HCC)   Depression   Dyslipidemia   Closed subcapital fracture of right femur (HCC)   Malnutrition of moderate degree  Closed right hip fracture: Fell at assisted living facility.  MRI of the right hip showed subcapital femoral fracture.  Orthopedics following.  Continue pain management and supportive care.  Status post right total hip arthroplasty.  PT/OT done, recommended SNF, started on aspirin for DVT prophylaxis  Acute blood loss anemia: Hemoglobin dropped to the range of 6 likely associated with surgery, given a unit of PRBC.  Hemoglobin today 7.2.  Continue to monitor hemoglobin.  AKI: Stable, IV fluids were discontinued  History of depression: On Wellbutrin, Zoloft, Depakote  Type 2 diabetes with peripheral neuropathy: On Neurontin.  Continue current insulin regimen.  Monitor blood sugars.  Hemoglobin A1c of 6.7  Hyperlipidemia: On statin  History of arthritis: Has debility due to bilateral knee arthritis, has difficulty in ambulation, previously ambulating with walker    Nutrition Problem:  Moderate Malnutrition Etiology: chronic illness (dementia)    DVT prophylaxis:SCDs Start: 04/20/22 1710     Code Status: Full Code  Family Communication: Called son on 9/9 on phone, called not received  Patient status:Inpatient  Patient is from :ALF  Anticipated discharge to: SnF  Estimated DC date:2-3 days   Consultants: Ortho  Procedures:Plan for ORIF  Antimicrobials:  Anti-infectives (From admission, onward)    Start     Dose/Rate Route Frequency Ordered Stop   04/20/22 1900  ceFAZolin (ANCEF) IVPB 2g/100 mL premix        2 g 200 mL/hr over 30 Minutes Intravenous Every 6 hours 04/20/22 1709 04/21/22 0137   04/20/22 1000  ceFAZolin (ANCEF) IVPB 2g/100 mL premix        2 g 200 mL/hr over 30 Minutes Intravenous On call to O.R. 04/20/22 0907 04/20/22 1301       Subjective: Patient seen and examined at the bedside today.  Hemodynamically stable without any complaints.  Denies any significant pain in the operated area  Objective: Vitals:   04/21/22 1746 04/21/22 1951 04/21/22 2125 04/22/22 0534  BP: 107/67 103/64 103/63 110/64  Pulse: 66 78 75   Resp: 15 15 18 17   Temp: 98 F (36.7 C) 98.2 F (36.8 C) 100.1 F (37.8 C) 98.9 F (37.2 C)  TempSrc:  Oral Oral Oral  SpO2: 100% 99% 98% 98%  Weight:      Height:        Intake/Output Summary (Last 24 hours) at 04/22/2022 1125 Last data filed at 04/22/2022 1000 Gross per 24 hour  Intake 2025.43 ml  Output 200 ml  Net 1825.43 ml   06/22/2022  Weights   04/17/22 1452  Weight: 77.1 kg    Examination:  General exam: Overall comfortable, not in distress HEENT: PERRL Respiratory system:  no wheezes or crackles  Cardiovascular system: S1 & S2 heard, RRR.  Gastrointestinal system: Abdomen is nondistended, soft and nontender. Central nervous system: Alert and oriented Extremities: No edema, no clubbing ,no cyanosis, surgical wound on the right hip Skin: No rashes, no ulcers,no icterus     Data Reviewed: I have  personally reviewed following labs and imaging studies  CBC: Recent Labs  Lab 04/18/22 0002 04/18/22 0522 04/20/22 0904 04/20/22 1445 04/21/22 0410 04/21/22 1333 04/22/22 0350  WBC 9.4 10.0 8.0  --  11.8*  --  10.3  NEUTROABS 5.9  --   --   --   --   --   --   HGB 10.8* 10.5* 10.5* 10.9* 7.5* 6.8* 7.2*  HCT 32.6* 32.1* 32.5* 32.0* 23.0* 20.9* 22.0*  MCV 92.1 92.0 91.5  --  93.1  --  92.8  PLT 312 282 293  --  229  --  198   Basic Metabolic Panel: Recent Labs  Lab 04/18/22 0002 04/18/22 0522 04/20/22 0904 04/20/22 1445 04/21/22 0410 04/22/22 0350  NA 140 140 141 137 139 141  K 4.3 4.3 4.3 5.7* 4.6 4.3  CL 105 106 106 103 106 111  CO2 27 27 29   --  25 25  GLUCOSE 110* 110* 133* 126* 202* 124*  BUN 21 20 18 23  27* 28*  CREATININE 1.16 1.18 1.29* 1.10 1.49* 1.31*  CALCIUM 8.9 8.8* 9.0  --  8.2* 8.2*     Recent Results (from the past 240 hour(s))  Surgical PCR screen     Status: None   Collection Time: 04/19/22  4:40 PM   Specimen: Nasal Mucosa; Nasal Swab  Result Value Ref Range Status   MRSA, PCR NEGATIVE NEGATIVE Final   Staphylococcus aureus NEGATIVE NEGATIVE Final    Comment: (NOTE) The Xpert SA Assay (FDA approved for NASAL specimens in patients 71 years of age and older), is one component of a comprehensive surveillance program. It is not intended to diagnose infection nor to guide or monitor treatment. Performed at [facility], [address], [place], [place]   Urine Culture     Status: Abnormal (Preliminary result)   Collection Time: 04/20/22  3:32 PM   Specimen: Urine, Catheterized  Result Value Ref Range Status   Specimen Description   Final    URINE, CATHETERIZED Performed at [facility], [address], [place], [place]    Special Requests   Final    NONE Performed at [facility], [address], [place], [place]    Culture (A)  Final    >=100,000  COLONIES/mL GRAM NEGATIVE RODS SUSCEPTIBILITIES TO FOLLOW Performed at [facility], [address] [place], [address] [place]    Report Status PENDING  Incomplete     Radiology Studies: DG Pelvis Portable  Result Date: 04/20/2022 CLINICAL DATA:  Status post right hip replacement. EXAM: PORTABLE PELVIS 1-2 VIEWS COMPARISON:  Preoperative imaging FINDINGS: Right hip arthroplasty in expected alignment. No periprosthetic lucency or fracture. Recent postsurgical change includes air and edema in the soft tissues. Lateral skin staples in place. IMPRESSION: Right hip arthroplasty without immediate postoperative complication. Electronically Signed   By: [id number] M.D.   On: 04/20/2022 17:42   DG HIP UNILAT WITH PELVIS 1V RIGHT  Result Date:  04/20/2022 CLINICAL DATA:  Fluoroscopic assistance for right hip arthroplasty EXAM: DG HIP (WITH OR WITHOUT PELVIS) 1V RIGHT COMPARISON:  Previous studies done on 04/17/2022 FINDINGS: Fluoroscopic images show right hip arthroplasty. Fluoroscopy time 18 seconds. Radiation dose 2.5 mGy. IMPRESSION: Fluoroscopic assistance was provided for right hip arthroplasty. Electronically Signed   By: Ernie Avena M.D.   On: 04/20/2022 15:53   DG C-Arm 1-60 Min-No Report  Result Date: 04/20/2022 Fluoroscopy was utilized by the requesting physician.  No radiographic interpretation.   DG C-Arm 1-60 Min-No Report  Result Date: 04/20/2022 Fluoroscopy was utilized by the requesting physician.  No radiographic interpretation.   DG C-Arm 1-60 Min-No Report  Result Date: 04/20/2022 Fluoroscopy was utilized by the requesting physician.  No radiographic interpretation.    Scheduled Meds:  aspirin  81 mg Oral BID   atorvastatin  40 mg Oral QHS   buPROPion  100 mg Oral BID   cholecalciferol  1,000 Units Oral Daily   divalproex  125 mg Oral QHS   docusate sodium  100 mg Oral BID   feeding supplement (GLUCERNA SHAKE)  237 mL Oral TID PC   gabapentin  100 mg Oral  TID   insulin aspart  0-5 Units Subcutaneous QHS   insulin aspart  0-9 Units Subcutaneous TID WC   insulin glargine-yfgn  5 Units Subcutaneous QHS   lidocaine  1 patch Transdermal Q24H   multivitamin with minerals  1 tablet Oral Daily   polyethylene glycol  17 g Oral BID   senna  1 tablet Oral BID   sertraline  200 mg Oral Daily   Continuous Infusions:  methocarbamol (ROBAXIN) IV       LOS: 4 days   Burnadette Pop, MD [facility] P9/04/2022, 11:25 AM

## 2022-04-23 DIAGNOSIS — S72001A Fracture of unspecified part of neck of right femur, initial encounter for closed fracture: Secondary | ICD-10-CM | POA: Diagnosis not present

## 2022-04-23 LAB — CBC
HCT: 22.7 % — ABNORMAL LOW (ref 39.0–52.0)
Hemoglobin: 7.5 g/dL — ABNORMAL LOW (ref 13.0–17.0)
MCH: 31 pg (ref 26.0–34.0)
MCHC: 33 g/dL (ref 30.0–36.0)
MCV: 93.8 fL (ref 80.0–100.0)
Platelets: 200 10*3/uL (ref 150–400)
RBC: 2.42 MIL/uL — ABNORMAL LOW (ref 4.22–5.81)
RDW: 14.4 % (ref 11.5–15.5)
WBC: 9.9 10*3/uL (ref 4.0–10.5)
nRBC: 0 % (ref 0.0–0.2)

## 2022-04-23 LAB — BASIC METABOLIC PANEL
Anion gap: 6 (ref 5–15)
BUN: 26 mg/dL — ABNORMAL HIGH (ref 8–23)
CO2: 25 mmol/L (ref 22–32)
Calcium: 8.3 mg/dL — ABNORMAL LOW (ref 8.9–10.3)
Chloride: 108 mmol/L (ref 98–111)
Creatinine, Ser: 1 mg/dL (ref 0.61–1.24)
GFR, Estimated: >60 mL/min (ref >60)
Glucose, Bld: 109 mg/dL — ABNORMAL HIGH (ref 70–99)
Potassium: 4.4 mmol/L (ref 3.5–5.1)
Sodium: 139 mmol/L (ref 135–145)

## 2022-04-23 LAB — GLUCOSE, CAPILLARY
Glucose-Capillary: 99 mg/dL (ref 70–99)
Glucose-Capillary: 156 mg/dL — ABNORMAL HIGH (ref 70–99)
Glucose-Capillary: 151 mg/dL — ABNORMAL HIGH (ref 70–99)
Glucose-Capillary: 146 mg/dL — ABNORMAL HIGH (ref 70–99)

## 2022-04-23 LAB — URINE CULTURE: Culture: >=100000 — AB

## 2022-04-23 MED ORDER — HYDROMORPHONE HCL 1 MG/ML IJ SOLN
0.5000 mg | INTRAMUSCULAR | Status: DC | PRN
  Administered 2022-04-23 – 2022-04-24 (×3): 0.5 mg via INTRAVENOUS
  Filled 2022-04-23 (×3): qty 0.5

## 2022-04-23 NOTE — Progress Notes (Signed)
PROGRESS NOTE  Bryan Farrell  RUE:[id number] DOB: 31-Dec-1950 DOA: 04/17/2022 PCP: Eloisa Northern, MD   Brief Narrative: Patient is a 71 year old male with history of dementia, diabetes type 2, hypertension who presented to the emergency department with complaint of right hip pain after a fall at assisted living facility.  X-ray performed at the facility showed right femoral fracture.  No head injuries or loss of consciousness.  On presentation he was hemodynamically stable.  X-ray did not show any acute findings MRI of the right hip showed subcapital femoral fracture.  Orthopedics consulted.  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   PT/OT evaluation done recommended SNF.  Medically stable for discharge whenever bed is available.  Assessment & Plan:  Principal Problem:   Closed right hip fracture (HCC) Active Problems:   Type 2 diabetes mellitus with peripheral neuropathy (HCC)   Depression   Dyslipidemia   Closed subcapital fracture of right femur (HCC)   Malnutrition of moderate degree  Closed right hip fracture: Fell at assisted living facility.  MRI of the right hip showed subcapital femoral fracture.  Orthopedics following.  Continue pain management and supportive care.  Status post right total hip arthroplasty.  PT/OT done, recommended SNF, started on aspirin for DVT prophylaxis  Acute blood loss anemia: Hemoglobin dropped to the range of 6 likely associated with surgery, given a unit of PRBC.  Hemoglobin stable now at 7.  Continue to monitor hemoglobin.  AKI: Stable, IV fluids were discontinued  History of depression: On Wellbutrin, Zoloft, Depakote  Type 2 diabetes with peripheral neuropathy: On Neurontin.  Continue current insulin regimen.  Monitor blood sugars.  Hemoglobin A1c of 6.7  Hyperlipidemia: On statin  History of arthritis: Has debility due to bilateral knee arthritis, has difficulty in ambulation, previously ambulating with walker    Nutrition Problem:  Moderate Malnutrition Etiology: chronic illness (dementia)    DVT prophylaxis:SCDs Start: 04/20/22 1710     Code Status: Full Code  Family Communication: Called both son on daughter on 9/9 and 9/10 ,calls not received  Patient status:Inpatient  Patient is from :ALF  Anticipated discharge to: SnF  Estimated DC date: Whenever bed is available   Consultants: Ortho  Procedures:ORIF  Antimicrobials:  Anti-infectives (From admission, onward)    Start     Dose/Rate Route Frequency Ordered Stop   04/20/22 1900  ceFAZolin (ANCEF) IVPB 2g/100 mL premix        2 g 200 mL/hr over 30 Minutes Intravenous Every 6 hours 04/20/22 1709 04/21/22 0137   04/20/22 1000  ceFAZolin (ANCEF) IVPB 2g/100 mL premix        2 g 200 mL/hr over 30 Minutes Intravenous On call to O.R. 04/20/22 0907 04/20/22 1301       Subjective: Patient seen and examined at bedside today.  Hemodynamically stable.  Complains of severe pain on the operated side.  Looks uncomfortable today  Objective: Vitals:   04/22/22 1357 04/22/22 1541 04/22/22 2127 04/23/22 0529  BP: (!) 90/58 100/68 126/78 126/75  Pulse: 68  71 70  Resp: 18  18 18   Temp: 98.4 F (36.9 C)  99.1 F (37.3 C) 99.9 F (37.7 C)  TempSrc: Oral     SpO2: 93%  97% 96%  Weight:      Height:        Intake/Output Summary (Last 24 hours) at 04/23/2022 1031 Last data filed at 04/23/2022 1027 Gross per 24 hour  Intake 240 ml  Output 1570 ml  Net -1330  ml   Filed Weights   04/17/22 1452  Weight: 77.1 kg    Examination:  General exam: Overall comfortable, little uncomfortable due to pain on the surgery site. HEENT: PERRL Respiratory system:  no wheezes or crackles  Cardiovascular system: S1 & S2 heard, RRR.  Gastrointestinal system: Abdomen is nondistended, soft and nontender. Central nervous system: Alert and oriented Extremities: No edema, no clubbing ,no cyanosis Skin: No rashes, no ulcers,no icterus     Data Reviewed: I have  personally reviewed following labs and imaging studies  CBC: Recent Labs  Lab 04/18/22 0002 04/18/22 0522 04/20/22 0904 04/20/22 1445 04/21/22 0410 04/21/22 1333 04/22/22 0350 04/23/22 0347  WBC 9.4 10.0 8.0  --  11.8*  --  10.3 9.9  NEUTROABS 5.9  --   --   --   --   --   --   --   HGB 10.8* 10.5* 10.5* 10.9* 7.5* 6.8* 7.2* 7.5*  HCT 32.6* 32.1* 32.5* 32.0* 23.0* 20.9* 22.0* 22.7*  MCV 92.1 92.0 91.5  --  93.1  --  92.8 93.8  PLT 312 282 293  --  229  --  198 200   Basic Metabolic Panel: Recent Labs  Lab 04/18/22 0522 04/20/22 0904 04/20/22 1445 04/21/22 0410 04/22/22 0350 04/23/22 0347  NA 140 141 137 139 141 139  K 4.3 4.3 5.7* 4.6 4.3 4.4  CL 106 106 103 106 111 108  CO2 27 29  --  25 25 25   GLUCOSE 110* 133* 126* 202* 124* 109*  BUN 20 18 23  27* 28* 26*  CREATININE 1.18 1.29* 1.10 1.49* 1.31* 1.00  CALCIUM 8.8* 9.0  --  8.2* 8.2* 8.3*     Recent Results (from the past 240 hour(s))  Surgical PCR screen     Status: None   Collection Time: 04/19/22  4:40 PM   Specimen: Nasal Mucosa; Nasal Swab  Result Value Ref Range Status   MRSA, PCR NEGATIVE NEGATIVE Final   Staphylococcus aureus NEGATIVE NEGATIVE Final    Comment: (NOTE) The Xpert SA Assay (FDA approved for NASAL specimens in patients 26 years of age and older), is one component of a comprehensive surveillance program. It is not intended to diagnose infection nor to guide or monitor treatment. Performed at [facility], [address], [place], [place]   Urine Culture     Status: Abnormal   Collection Time: 04/20/22  3:32 PM   Specimen: Urine, Catheterized  Result Value Ref Range Status   Specimen Description   Final    URINE, CATHETERIZED Performed at [facility], [address] [place], [place]    Special Requests   Final    NONE Performed at [facility], [address], [place], [place]    Culture  >=100,000 COLONIES/mL ESCHERICHIA COLI (A)  Final   Report Status 04/23/2022 FINAL  Final   Organism ID, Bacteria ESCHERICHIA COLI (A)  Final      Susceptibility   Escherichia coli - MIC*    AMPICILLIN 8 SENSITIVE Sensitive     CEFAZOLIN <=4 SENSITIVE Sensitive     CEFEPIME <=0.12 SENSITIVE Sensitive     CEFTRIAXONE <=0.25 SENSITIVE Sensitive     CIPROFLOXACIN <=0.25 SENSITIVE Sensitive     GENTAMICIN <=1 SENSITIVE Sensitive     IMIPENEM <=0.25 SENSITIVE Sensitive     NITROFURANTOIN <=16 SENSITIVE Sensitive     TRIMETH/SULFA <=20 SENSITIVE Sensitive     AMPICILLIN/SULBACTAM 4 SENSITIVE  Sensitive     PIP/TAZO <=4 SENSITIVE Sensitive     * >=100,000 COLONIES/mL ESCHERICHIA COLI     Radiology Studies: No results found.  Scheduled Meds:  aspirin  81 mg Oral BID   atorvastatin  40 mg Oral QHS   buPROPion  100 mg Oral BID   cholecalciferol  1,000 Units Oral Daily   divalproex  125 mg Oral QHS   docusate sodium  100 mg Oral BID   feeding supplement (GLUCERNA SHAKE)  237 mL Oral TID PC   gabapentin  100 mg Oral TID   insulin aspart  0-5 Units Subcutaneous QHS   insulin aspart  0-9 Units Subcutaneous TID WC   insulin glargine-yfgn  5 Units Subcutaneous QHS   lidocaine  1 patch Transdermal Q24H   multivitamin with minerals  1 tablet Oral Daily   polyethylene glycol  17 g Oral BID   senna  1 tablet Oral BID   sertraline  200 mg Oral Daily   Continuous Infusions:  methocarbamol (ROBAXIN) IV       LOS: 5 days   Burnadette Pop, MD [facility] P9/05/2022, 10:31 AM

## 2022-04-23 NOTE — Progress Notes (Signed)
Patient refused all hs medications except Semglee 5 units.

## 2022-04-23 NOTE — Plan of Care (Signed)
  Problem: Clinical Measurements: Goal: Will remain free from infection 04/23/2022 0747 by Sherren Kerns, RN Outcome: Progressing 04/23/2022 0715 by Sherren Kerns, RN Outcome: Progressing   Problem: Clinical Measurements: Goal: Diagnostic test results will improve 04/23/2022 0747 by Sherren Kerns, RN Outcome: Progressing 04/23/2022 0715 by Sherren Kerns, RN Outcome: Progressing   Problem: Clinical Measurements: Goal: Respiratory complications will improve 04/23/2022 0747 by Sherren Kerns, RN Outcome: Progressing 04/23/2022 0715 by Sherren Kerns, RN Outcome: Progressing   Problem: Activity: Goal: Risk for activity intolerance will decrease 04/23/2022 0747 by Sherren Kerns, RN Outcome: Progressing 04/23/2022 0715 by Sherren Kerns, RN Outcome: Progressing

## 2022-04-23 NOTE — Progress Notes (Signed)
Pt did not want to get out of bed throughout shift. Nursing staff offered to assist pt out of bed to chair but pt wanted to rest in bed. Rn educated pt on importance of getting out of bed to prevent complications. Pt seems overall depressed and tired. Pt food intake is low most of the time but is taking ensures well. Pt remains stable. Rn will continue to monitor.

## 2022-04-23 NOTE — Plan of Care (Signed)
  Problem: Clinical Measurements: Goal: Diagnostic test results will improve Outcome: Progressing   Problem: Clinical Measurements: Goal: Respiratory complications will improve Outcome: Progressing   Problem: Clinical Measurements: Goal: Cardiovascular complication will be avoided Outcome: Progressing   Problem: Coping: Goal: Level of anxiety will decrease Outcome: Progressing   Problem: Skin Integrity: Goal: Risk for impaired skin integrity will decrease Outcome: Progressing   

## 2022-04-23 NOTE — Progress Notes (Signed)
Patient refused bladder scan 

## 2022-04-24 DIAGNOSIS — S72001A Fracture of unspecified part of neck of right femur, initial encounter for closed fracture: Secondary | ICD-10-CM | POA: Diagnosis not present

## 2022-04-24 LAB — CBC
HCT: 23.5 % — ABNORMAL LOW (ref 39.0–52.0)
Hemoglobin: 7.6 g/dL — ABNORMAL LOW (ref 13.0–17.0)
MCH: 30.8 pg (ref 26.0–34.0)
MCHC: 32.3 g/dL (ref 30.0–36.0)
MCV: 95.1 fL (ref 80.0–100.0)
Platelets: 237 10*3/uL (ref 150–400)
RBC: 2.47 MIL/uL — ABNORMAL LOW (ref 4.22–5.81)
RDW: 13.8 % (ref 11.5–15.5)
WBC: 9.2 10*3/uL (ref 4.0–10.5)
nRBC: 0 % (ref 0.0–0.2)

## 2022-04-24 LAB — GLUCOSE, CAPILLARY
Glucose-Capillary: 105 mg/dL — ABNORMAL HIGH (ref 70–99)
Glucose-Capillary: 110 mg/dL — ABNORMAL HIGH (ref 70–99)
Glucose-Capillary: 159 mg/dL — ABNORMAL HIGH (ref 70–99)

## 2022-04-24 MED ORDER — FERROUS SULFATE 325 (65 FE) MG PO TABS: 325.0000 mg | ORAL_TABLET | Freq: Every day | ORAL | Status: DC

## 2022-04-24 MED ORDER — FERROUS SULFATE 325 (65 FE) MG PO TABS: 325.0000 mg | ORAL_TABLET | Freq: Every day | ORAL | 0 refills | Status: AC

## 2022-04-24 NOTE — [place] FL2 (Signed)
MEDICAID FL2 LEVEL OF CARE SCREENING TOOL     IDENTIFICATION  Patient Name: Bryan Farrell Birthdate: 31-Jul-1951 Sex: male Admission Date (Current Location): 04/17/2022  [facility] and [place] Number:  Producer, television/film/video and Address:  [facility],  [address] [place], [place] [address]      Provider Number: [id number]  Attending Physician Name and Address:  Burnadette Pop, MD  Relative Name and Phone Number:  Wolfgang, Finigan [phone]  [phone]  Seymour Bars Daughter [phone]    Current Level of Care: Hospital Recommended Level of Care: Skilled Nursing Facility Prior Approval Number:    Date Approved/Denied:   PASRR Number: [id number]  Discharge Plan: SNF    Current Diagnoses: Patient Active Problem List   Diagnosis Date Noted   Malnutrition of moderate degree 04/20/2022   Closed right hip fracture (HCC) 04/18/2022   Type 2 diabetes mellitus with peripheral neuropathy (HCC) 04/18/2022   Depression 04/18/2022   Dyslipidemia 04/18/2022   Closed subcapital fracture of right femur (HCC)    Toe osteomyelitis, right (HCC)    AKI (acute kidney injury) (HCC) 09/02/2021   Hypokalemia 09/02/2021   Influenza A 09/02/2021   Sepsis due to cellulitis (HCC) 08/25/2021   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HCC)    Bacteremia due to Enterococcus 05/29/2019   Toe osteomyelitis, left (HCC)    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)    Mild protein-calorie malnutrition (HCC)    Osteomyelitis (HCC) 05/27/2019   Moderate recurrent major depression (HCC)    Adjustment disorder with depressed mood 04/13/2017   Vitamin B12 deficiency 03/25/2017   Vascular dementia (HCC) 03/23/2017   Suicidal ideation 03/23/2017   Homelessness 03/23/2017   Diabetes (HCC) 03/23/2017    Orientation RESPIRATION BLADDER Height & Weight     Time  Normal Incontinent Weight: 170 lb (77.1 kg) Height:  6' (182.9 cm)  BEHAVIORAL SYMPTOMS/MOOD NEUROLOGICAL BOWEL NUTRITION  STATUS      Incontinent Diet  AMBULATORY STATUS COMMUNICATION OF NEEDS Skin   Limited Assist Verbally Surgical wounds                       Personal Care Assistance Level of Assistance  Bathing, Feeding, Dressing Bathing Assistance: Limited assistance Feeding assistance: Limited assistance Dressing Assistance: Limited assistance     Functional Limitations Info  Sight, Hearing, Speech Sight Info: Adequate Hearing Info: Adequate Speech Info: Adequate    SPECIAL CARE FACTORS FREQUENCY  PT (By licensed PT), OT (By licensed OT)     PT Frequency: Minimum 5x a week OT Frequency: Minimum 5x a week            Contractures Contractures Info: Not present    Additional Factors Info  Code Status, Allergies, Psychotropic, Insulin Sliding Scale Code Status Info: Full Code Allergies Info: No Known Allergies Psychotropic Info: buPROPion (WELLBUTRIN) tablet 100 mg, divalproex (DEPAKOTE) DR tablet 125 mg, sertraline (ZOLOFT) tablet 200 mg Insulin Sliding Scale Info: insulin aspart (novoLOG) injection 0-9 Units 3x a day with meals       Current Medications (04/24/2022):  This is the current hospital active medication list Current Facility-Administered Medications  Medication Dose Route Frequency Provider Last Rate Last Admin   alum & mag hydroxide-simeth (MAALOX/MYLANTA) 200-200-20 MG/5ML suspension 30 mL  30 mL Oral Q4H PRN Swinteck, Arlys John, MD       aspirin chewable tablet 81 mg  81 mg Oral BID Samson Frederic, MD   81 mg at 04/24/22 [phone]  atorvastatin (LIPITOR) tablet 40 mg  40 mg Oral QHS Swinteck, Arlys John, MD   40 mg at 04/21/22 2259   buPROPion [facility]) tablet 100 mg  100 mg Oral BID Samson Frederic, MD   100 mg at 04/24/22 6967   cholecalciferol (VITAMIN D3) 25 MCG (1000 UNIT) tablet 1,000 Units  1,000 Units Oral Daily Samson Frederic, MD   1,000 Units at 04/24/22 0902   diphenhydrAMINE (BENADRYL) 12.5 MG/5ML elixir 12.5-25 mg  12.5-25 mg Oral Q4H PRN Swinteck, Arlys John, MD        divalproex (DEPAKOTE) DR tablet 125 mg  125 mg Oral QHS Samson Frederic, MD   125 mg at 04/21/22 2259   docusate sodium (COLACE) capsule 100 mg  100 mg Oral BID Samson Frederic, MD   100 mg at 04/24/22 0902   feeding supplement (GLUCERNA SHAKE) (GLUCERNA SHAKE) liquid 237 mL  237 mL Oral TID PC Swinteck, Arlys John, MD   237 mL at 04/24/22 1152   [START ON 04/25/2022] ferrous sulfate tablet 325 mg  325 mg Oral Q breakfast Adhikari, Willia Craze, MD       gabapentin (NEURONTIN) capsule 100 mg  100 mg Oral TID Samson Frederic, MD   100 mg at 04/24/22 0902   HYDROcodone-acetaminophen (NORCO/VICODIN) 5-325 MG per tablet 1-2 tablet  1-2 tablet Oral Q6H PRN Samson Frederic, MD   1 tablet at 04/24/22 0902   HYDROmorphone (DILAUDID) injection 0.5 mg  0.5 mg Intravenous Q3H PRN Burnadette Pop, MD   0.5 mg at 04/24/22 0915   insulin aspart (novoLOG) injection 0-5 Units  0-5 Units Subcutaneous QHS Samson Frederic, MD   3 Units at 04/20/22 2201   insulin aspart (novoLOG) injection 0-9 Units  0-9 Units Subcutaneous TID WC Samson Frederic, MD   1 Units at 04/22/22 0831   insulin glargine-yfgn (SEMGLEE) injection 5 Units  5 Units Subcutaneous QHS Samson Frederic, MD   5 Units at 04/23/22 2143   lidocaine (LIDODERM) 5 % 1 patch  1 patch Transdermal Q24H Swinteck, Arlys John, MD   1 patch at 04/24/22 0911   magnesium hydroxide (MILK OF MAGNESIA) suspension 30 mL  30 mL Oral Daily PRN Swinteck, Arlys John, MD       menthol-cetylpyridinium (CEPACOL) lozenge 3 mg  1 lozenge Oral PRN Swinteck, Arlys John, MD       Or   phenol (CHLORASEPTIC) mouth spray 1 spray  1 spray Mouth/Throat PRN Swinteck, Arlys John, MD       methocarbamol (ROBAXIN) tablet 500 mg  500 mg Oral Q6H PRN Swinteck, Arlys John, MD   500 mg at 04/24/22 0211   Or   methocarbamol (ROBAXIN) 500 mg in dextrose 5 % 50 mL IVPB  500 mg Intravenous Q6H PRN Swinteck, Arlys John, MD       metoCLOPramide (REGLAN) tablet 5-10 mg  5-10 mg Oral Q8H PRN Swinteck, Arlys John, MD       Or   metoCLOPramide  (REGLAN) injection 5-10 mg  5-10 mg Intravenous Q8H PRN Swinteck, Arlys John, MD       multivitamin with minerals tablet 1 tablet  1 tablet Oral Daily Swinteck, Arlys John, MD   1 tablet at 04/24/22 0902   ondansetron (ZOFRAN) tablet 4 mg  4 mg Oral Q6H PRN Swinteck, Arlys John, MD       Or   ondansetron (ZOFRAN) injection 4 mg  4 mg Intravenous Q6H PRN Samson Frederic, MD   4 mg at 04/21/22 1124   polyethylene glycol (MIRALAX / GLYCOLAX) packet 17 g  17 g Oral BID Samson Frederic, MD  17 g at 04/24/22 0904   scopolamine (TRANSDERM-SCOP) 1 MG/3DAYS 1.5 mg  1 patch Transdermal Q3 days PRN Samson Frederic, MD       senna (SENOKOT) tablet 8.6 mg  1 tablet Oral BID Samson Frederic, MD   8.6 mg at 04/24/22 0902   sertraline (ZOLOFT) tablet 200 mg  200 mg Oral Daily Samson Frederic, MD   200 mg at 04/24/22 0957   traZODone (DESYREL) tablet 25 mg  25 mg Oral QHS PRN Samson Frederic, MD         Discharge Medications: Please see discharge summary for a list of discharge medications.  Relevant Imaging Results:  Relevant Lab Results:   Additional Information SSN [id number]  Darleene Cleaver, LCSW

## 2022-04-24 NOTE — Progress Notes (Signed)
Called and report given to nurse Kia.

## 2022-04-24 NOTE — Progress Notes (Signed)
Physical Therapy Treatment Patient Details Name: Bryan Farrell MRN: [id number] DOB: [birth year] Today's Date: 04/24/2022   History of Present Illness Patient is a 71 year old male with history of dementia, diabetes type 2, hypertension who presented to the emergency department with complaint of right hip pain after a fall at assisted living facility. MRI of the right hip showed subcapital femoral fracture. 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 asleep on arrival, did not eat breakfast, mobility limited by pain/cognition. Provided with chocolate ensure after PT session with approval from RN.   PT may benefit from Palliative Care consult. Recommend SNF  and continue efforts to mobilize.  Recommendations for follow up therapy are one component of a multi-disciplinary discharge planning process, led by the attending physician.  Recommendations may be updated based on patient status, additional functional criteria and insurance authorization.  Follow Up Recommendations  Skilled nursing-short term rehab (<3 hours/day) Can patient physically be transported by private vehicle: No   Assistance Recommended at Discharge Frequent or constant Supervision/Assistance  Patient can return home with the following Two people to help with walking and/or transfers;A lot of help with bathing/dressing/bathroom   Equipment Recommendations  None recommended by PT    Recommendations for Other Services       Precautions / Restrictions Restrictions Weight Bearing Restrictions: No RLE Weight Bearing: Weight bearing as tolerated     Mobility  Bed Mobility Overal bed mobility: Needs Assistance Bed Mobility: Supine to Sit, Sit to Supine     Supine to sit: Total assist, +2 for physical assistance, +2 for safety/equipment Sit to supine: Mod assist, Min assist, +2 for physical assistance, +2 for safety/equipment   General bed  mobility comments: Little initiation from pt. Multimodal cueing required. Assist for trunk and bil LEs. Utilized bedpad for scooting, positioning. pt resistant to upright/sitting position. returns self to supine lifitng LEs on to bed; total assist required for positioning    Transfers                   General transfer comment: pt refused    Ambulation/Gait                   Stairs             Wheelchair Mobility    Modified Rankin (Stroke Patients Only)       Balance                                            Cognition Arousal/Alertness: Awake/alert Behavior During Therapy: Flat affect (resistant to PT) Overall Cognitive Status: History of cognitive impairments - at baseline                                 General Comments: Hx of dementia        Exercises      General Comments        Pertinent Vitals/Pain Pain Assessment Pain Assessment: Faces Faces Pain Scale: Hurts whole lot Pain Location: R hip Pain Descriptors / Indicators: Sore, Guarding, Grimacing, Moaning Pain Intervention(s): Limited activity within patient's tolerance, Monitored during session, Repositioned    Home Living  Prior Function            PT Goals (current goals can now be found in the care plan section) Acute Rehab PT Goals Patient Stated Goal: none stated PT Goal Formulation: Patient unable to participate in goal setting Time For Goal Achievement: 05/05/22 Potential to Achieve Goals: Poor Progress towards PT goals: Not progressing toward goals - comment    Frequency    Min 2X/week      PT Plan Current plan remains appropriate    Co-evaluation              AM-PAC PT "6 Clicks" Mobility   Outcome Measure  Help needed turning from your back to your side while in a flat bed without using bedrails?: Total Help needed moving from lying on your back to sitting on the side of a  flat bed without using bedrails?: Total Help needed moving to and from a bed to a chair (including a wheelchair)?: Total Help needed standing up from a chair using your arms (e.g., wheelchair or bedside chair)?: Total Help needed to walk in hospital room?: Total Help needed climbing 3-5 steps with a railing? : Total 6 Click Score: 6    End of Session   Activity Tolerance: No increased pain;Other (comment) (cognition) Patient left: in bed;with call bell/phone within reach;with bed alarm set   PT Visit Diagnosis: Muscle weakness (generalized) (M62.81);Other abnormalities of gait and mobility (R26.89);Pain Pain - Right/Left: Right Pain - part of body: Hip     Time: 1059-1110 PT Time Calculation (min) (ACUTE ONLY): 11 min  Charges:  $Therapeutic Activity: 8-22 mins                     Delice Bison, PT  Acute Rehab Dept [facility]) [phone]  [facility] Weekend Pager [facility] only)  [phone]  04/24/2022    [facility] 04/24/2022, 11:28 AM

## 2022-04-24 NOTE — TOC Transition Note (Addendum)
Transition of Care [facility]) - CM/SW Discharge Note   Patient Details  Name: Bryan Farrell MRN: [id number] Date of Birth: February 15, 1951  Transition of Care [facility] CM/SW Contact:  Darleene Cleaver, LCSW Phone Number: 04/24/2022, 2:13 PM   Clinical Narrative:     Patient to be d/c'ed today to [facility] room 233B.  Patient and family agreeable to plans will transport via ems RN to call report to [phone].  CSW attempted to contact patient's son and daughter, left message on voice mail.  Waiting for a call back    Final next level of care: Skilled Nursing Facility Barriers to Discharge: Barriers Resolved   Patient Goals and CMS Choice Patient states their goals for this hospitalization and ongoing recovery are:: To return back to SNF CMS Medicare.gov Compare Post Acute Care list provided to:: Patient Choice offered to / list presented to : Patient  Discharge Placement   Existing PASRR number confirmed : 04/24/22          Patient [facility] at: [facility] Patient to be transferred to facility by: [facility] Name of family member notified: Attempted to call daugher and son left message on both voice mails. Patient and family notified of of transfer: 04/24/22  Discharge Plan and Services                                     Social Determinants of Health (SDOH) Interventions     Readmission Risk Interventions     No data to display

## 2022-04-24 NOTE — Plan of Care (Signed)
  Problem: Activity: Goal: Risk for activity intolerance will decrease Outcome: Progressing   Problem: Pain Managment: Goal: General experience of comfort will improve Outcome: Progressing   Problem: Safety: Goal: Ability to remain free from injury will improve Outcome: Progressing   

## 2022-04-24 NOTE — Discharge Summary (Signed)
Physician Discharge Summary  Terrin Meddaugh BSW:[id number] DOB: 18-May-1951 DOA: 04/17/2022  PCP: Eloisa Northern, MD  Admit date: 04/17/2022 Discharge date: 04/24/2022  Admitted From: Home Disposition: SNF  Discharge Condition:Stable CODE STATUS:FULL Diet recommendation: Carbohydrate consistent  Brief/Interim Summary:  Patient is a 71 year old male with history of dementia, diabetes type 2, hypertension who presented to the emergency department with complaint of right hip pain after a fall at assisted living facility.  X-ray performed at the facility showed right femoral fracture.  No head injuries or loss of consciousness.  On presentation he was hemodynamically stable.  X-ray did not show any acute findings MRI of the right hip showed subcapital femoral fracture.  Orthopedics consulted.  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   PT/OT evaluation done recommended SNF.  Medically stable for discharge whenever bed is available.  Following problems were addressed during his hospitalization:  Closed right hip fracture: Larey Seat at assisted living facility.  MRI of the right hip showed subcapital femoral fracture.  Orthopedics were following.  Continue pain management and supportive care.  Status post right total hip arthroplasty.  PT/OT done, recommended SNF, started on aspirin for DVT prophylaxis   Acute blood loss anemia: Hemoglobin dropped to the range of 6 likely associated with surgery, given a unit of PRBC.  Hemoglobin stable now at 7.  Continue to monitor hemoglobin.Check CBC in a week   AKI: Stable, IV fluids were discontinued   History of depression: On Wellbutrin, Zoloft, Depakote   Type 2 diabetes with peripheral neuropathy: On Neurontin.  Continue home regimen. Hemoglobin A1c of 6.7   Hyperlipidemia: On statin   History of arthritis: Has debility due to bilateral knee arthritis, has difficulty in ambulation, previously ambulating with walker   Discharge Diagnoses:   Principal Problem:   Closed right hip fracture (HCC) Active Problems:   Type 2 diabetes mellitus with peripheral neuropathy (HCC)   Depression   Dyslipidemia   Closed subcapital fracture of right femur (HCC)   Malnutrition of moderate degree    Discharge Instructions  Discharge Instructions     Diet - low sodium heart healthy   Complete by: As directed    Discharge instructions   Complete by: As directed    1)Please take prescribed medications as instructed 2)Follow up with orthopedics in 2 weeks.  Number and number of the provider has been attached 3)Do a CBC test in a week to check your hemoglobin   Increase activity slowly   Complete by: As directed    No wound care   Complete by: As directed       Allergies as of 04/24/2022   No Known Allergies      Medication List     TAKE these medications    acetaminophen 325 MG tablet Commonly known as: TYLENOL Take 650 mg by mouth in the morning.   aspirin 81 MG chewable tablet Commonly known as: Aspirin Childrens Chew 1 tablet (81 mg total) by mouth 2 (two) times daily with a meal.   atorvastatin 40 MG tablet Commonly known as: LIPITOR Take 40 mg by mouth at bedtime.   buPROPion 100 MG tablet Commonly known as: WELLBUTRIN Take 100 mg by mouth in the morning and at bedtime.   chlorhexidine 0.12 % solution Commonly known as: PERIDEX 15 mLs by Mouth Rinse route in the morning.   cholecalciferol 25 MCG (1000 UNIT) tablet Commonly known as: VITAMIN D3 Take 1,000 Units by mouth daily.   divalproex 125 MG DR tablet  Commonly known as: DEPAKOTE Take 125 mg by mouth at bedtime.   feeding supplement (GLUCERNA SHAKE) Liqd Take 237 mLs by mouth 3 (three) times daily after meals.   ferrous sulfate 325 (65 FE) MG tablet Take 1 tablet (325 mg total) by mouth daily with breakfast. Start taking on: April 25, 2022   gabapentin 100 MG capsule Commonly known as: Neurontin Take 1 capsule (100 mg total) by mouth 3  (three) times daily.   HYDROcodone-acetaminophen 10-325 MG tablet Commonly known as: Norco Take 0.5 tablets by mouth every 4 (four) hours as needed for up to 7 days.   insulin glargine 100 UNIT/ML injection Commonly known as: LANTUS Inject 0.05 mLs (5 Units total) into the skin at bedtime.   lidocaine 4 % Place 1 patch onto the skin daily.   multivitamin with minerals tablet Take 1 tablet by mouth daily.   ondansetron 4 MG tablet Commonly known as: ZOFRAN Take 4 mg by mouth every 8 (eight) hours as needed for nausea or vomiting.   polyethylene glycol powder 17 GM/SCOOP powder Commonly known as: GLYCOLAX/MIRALAX Take 17 g by mouth in the morning and at bedtime.   sennosides-docusate sodium 8.6-50 MG tablet Commonly known as: SENOKOT-S Take 1-2 tablets by mouth See admin instructions. Take 1 tablet by mouth in the morning and 2 tablets at 5 PM daily   sertraline 100 MG tablet Commonly known as: ZOLOFT Take 200 mg by mouth daily.   Transderm-Scop 1 MG/3DAYS Generic drug: scopolamine Place 1 patch onto the skin every three (3) days as needed (for motion sickness with emesis- "apply 4 hours before OOB with therapy").   Trulicity 1.5 MG/0.5ML Sopn Generic drug: Dulaglutide Inject 1.5 mg into the skin every Saturday.        Follow-up Information     Swinteck, Arlys John, MD. Schedule an appointment as soon as possible for a visit in 2 week(s).   Specialty: Orthopedic Surgery Why: For wound re-check, For suture removal Contact information: [address] [place] [address] [phone]                No Known Allergies  Consultations: Orthopedics   Procedures/Studies: DG Pelvis Portable  Result Date: 04/20/2022 CLINICAL DATA:  Status post right hip replacement. EXAM: PORTABLE PELVIS 1-2 VIEWS COMPARISON:  Preoperative imaging FINDINGS: Right hip arthroplasty in expected alignment. No periprosthetic lucency or fracture. Recent postsurgical  change includes air and edema in the soft tissues. Lateral skin staples in place. IMPRESSION: Right hip arthroplasty without immediate postoperative complication. Electronically Signed   By: Narda Rutherford M.D.   On: 04/20/2022 17:42   DG HIP UNILAT WITH PELVIS 1V RIGHT  Result Date: 04/20/2022 CLINICAL DATA:  Fluoroscopic assistance for right hip arthroplasty EXAM: DG HIP (WITH OR WITHOUT PELVIS) 1V RIGHT COMPARISON:  Previous studies done on 04/17/2022 FINDINGS: Fluoroscopic images show right hip arthroplasty. Fluoroscopy time 18 seconds. Radiation dose 2.5 mGy. IMPRESSION: Fluoroscopic assistance was provided for right hip arthroplasty. Electronically Signed   By: Ernie Avena M.D.   On: 04/20/2022 15:53   DG C-Arm 1-60 Min-No Report  Result Date: 04/20/2022 Fluoroscopy was utilized by the requesting physician.  No radiographic interpretation.   DG C-Arm 1-60 Min-No Report  Result Date: 04/20/2022 Fluoroscopy was utilized by the requesting physician.  No radiographic interpretation.   DG C-Arm 1-60 Min-No Report  Result Date: 04/20/2022 Fluoroscopy was utilized by the requesting physician.  No radiographic interpretation.   Inpatient: VAS [place] PAD ABI  Result Date:  04/19/2022 DEFAULT Cardiovascular   Pt. Name: Grantley Savage Smigel         Pt. DOB: 1951-07-15         ID: [id number]                  Pt. Age: 25 years Study Type: VAS [place] ABI WITH/WO TBI   Exam Date: 04/19/2022 Ref. Phys.: [id number] Samson Frederic Review Date: 04/19/2022  Diag Phys: Coral Else MD          Modality: [place]  Indications: History: Diagnosis: Impression: Summary:   Final    MR HIP RIGHT WO CONTRAST  Result Date: 04/18/2022 CLINICAL DATA:  Larey Seat 4 days ago.  Right hip pain. EXAM: MR OF THE RIGHT HIP WITHOUT CONTRAST TECHNIQUE: Multiplanar, multisequence MR imaging was performed. No intravenous contrast was administered. COMPARISON:  Radiographs 04/17/2022 FINDINGS: There is a mildly impacted subcapital fracture of the right  hip. Moderate surrounding marrow edema. Small associated joint effusion. The left hip is intact. The pubic symphysis and SI joints are intact. No pelvic fractures or bone lesions. The hip and pelvic musculature is intact. No muscle tear or myositis. Mild fatty atrophy of the hip and pelvic musculature. No significant intrapelvic abnormalities are identified. IMPRESSION: 1. Mildly impacted subcapital fracture of the right hip. 2. Intact bony pelvis. 3. No significant intrapelvic abnormalities. Electronically Signed   By: Rudie Meyer M.D.   On: 04/18/2022 07:37   DG Chest Port 1 View  Result Date: 04/17/2022 CLINICAL DATA:  Preop, hip fracture. EXAM: PORTABLE CHEST 1 VIEW COMPARISON:  08/25/2021 FINDINGS: Low lung volumes.The cardiomediastinal contours are normal. Multiple skin folds project over the left hemithorax. Pulmonary vasculature is normal. No consolidation, pleural effusion, or pneumothorax. No acute osseous abnormalities are seen. IMPRESSION: 1. Low lung volumes without acute abnormality. 2. Multiple skin folds project over the left hemithorax. Electronically Signed   By: Narda Rutherford M.D.   On: 04/17/2022 23:57   DG Knee 2 Views Right  Result Date: 04/17/2022 CLINICAL DATA:  knee pain. Pt c/o right knee pain after having a right hip injury, Unable to hold his leg straight for images EXAM: RIGHT KNEE - 1-2 VIEW COMPARISON:  None Available. FINDINGS: No evidence of fracture, dislocation, or joint effusion. No evidence of arthropathy or other focal bone abnormality. Soft tissues are unremarkable. Vascular calcification. IMPRESSION: Negative. Electronically Signed   By: Tish Frederickson M.D.   On: 04/17/2022 17:21   DG Hip Unilat W or Wo Pelvis 2-3 Views Right  Result Date: 04/17/2022 CLINICAL DATA:  Right hip pain after a fall 3 days ago. EXAM: DG HIP (WITH OR WITHOUT PELVIS) 2-3V RIGHT COMPARISON:  None Available. FINDINGS: No fracture.  No bone lesion. Mild narrowing of the superolateral right  hip joint space. Small marginal osteophytes from the base of the right femoral head. Left hip joint normally spaced and aligned. SI joints and symphysis pubis normally spaced and aligned. Bilateral iliac and femoral artery vascular calcifications. IMPRESSION: 1. No fracture or acute finding. 2. Mild right hip joint osteoarthritis. Electronically Signed   By: Amie Portland M.D.   On: 04/17/2022 15:47      Subjective: Patient seen and examined the bedside today.  Hemodynamically stable.  Complains of some pain on the right hip where the surgery was done.  Medically stable for discharge  Discharge Exam: Vitals:   04/23/22 2229 04/24/22 0533  BP: 111/72 121/73  Pulse: 73 73  Resp: 17 17  Temp: 98.3 F (36.8 C)  99.1 F (37.3 C)  SpO2: 98% 96%   Vitals:   04/23/22 0529 04/23/22 1326 04/23/22 2229 04/24/22 0533  BP: 126/75 101/64 111/72 121/73  Pulse: 70 72 73 73  Resp: Temp: 99.9 F (37.7 C) 98.3 F (36.8 C) 98.3 F (36.8 C) 99.1 F (37.3 C)  TempSrc:  Oral Oral   SpO2: 96% 96% 98% 96%  Weight:      Height:        General: Pt is alert, awake, not in acute distress Cardiovascular: RRR, S1/S2 +, no rubs, no gallops Respiratory: CTA bilaterally, no wheezing, no rhonchi Abdominal: Soft, NT, ND, bowel sounds + Extremities: no edema, no cyanosis, right hip surgical wound    The results of significant diagnostics from this hospitalization (including imaging, microbiology, ancillary and laboratory) are listed below for reference.     Microbiology: Recent Results (from the past 240 hour(s))  Surgical PCR screen     Status: None   Collection Time: 04/19/22  4:40 PM   Specimen: Nasal Mucosa; Nasal Swab  Result Value Ref Range Status   MRSA, PCR NEGATIVE NEGATIVE Final   Staphylococcus aureus NEGATIVE NEGATIVE Final    Comment: (NOTE) The Xpert SA Assay (FDA approved for NASAL specimens in patients 84 years of age and older), is one component of a  comprehensive surveillance program. It is not intended to diagnose infection nor to guide or monitor treatment. Performed at [facility], [address] [place], [place] [address]   Urine Culture     Status: Abnormal   Collection Time: 04/20/22  3:32 PM   Specimen: Urine, Catheterized  Result Value Ref Range Status   Specimen Description   Final    URINE, CATHETERIZED Performed at [facility], [address] [place], [place] [address]    Special Requests   Final    NONE Performed at [facility], [address], [place], [place] [address]    Culture >=100,000 COLONIES/mL ESCHERICHIA COLI (A)  Final   Report Status 04/23/2022 FINAL  Final   Organism ID, Bacteria ESCHERICHIA COLI (A)  Final      Susceptibility   Escherichia coli - MIC*    AMPICILLIN 8 SENSITIVE Sensitive     CEFAZOLIN <=4 SENSITIVE Sensitive     CEFEPIME <=0.12 SENSITIVE Sensitive     CEFTRIAXONE <=0.25 SENSITIVE Sensitive     CIPROFLOXACIN <=0.25 SENSITIVE Sensitive     GENTAMICIN <=1 SENSITIVE Sensitive     IMIPENEM <=0.25 SENSITIVE Sensitive     NITROFURANTOIN <=16 SENSITIVE Sensitive     TRIMETH/SULFA <=20 SENSITIVE Sensitive     AMPICILLIN/SULBACTAM 4 SENSITIVE Sensitive     PIP/TAZO <=4 SENSITIVE Sensitive     * >=100,000 COLONIES/mL ESCHERICHIA COLI     Labs: BNP (last 3 results) No results for input(s): "BNP" in the last 8760 hours. Basic Metabolic Panel: Recent Labs  Lab 04/18/22 0522 04/20/22 0904 04/20/22 1445 04/21/22 0410 04/22/22 0350 04/23/22 0347  NA 140 141 137 139 141 139  K 4.3 4.3 5.7* 4.6 4.3 4.4  CL 106 106 103 106 111 108  CO2 27 29  --  GLUCOSE 110* 133* 126* 202* 124* 109*  BUN 27* 28* 26*  CREATININE 1.18 1.29* 1.10 1.49* 1.31* 1.00  CALCIUM 8.8* 9.0  --  8.2* 8.2* 8.3*   Liver Function Tests: No results for input(s): "AST", "ALT", "ALKPHOS", "BILITOT", "PROT", "ALBUMIN" in the last  168  hours. No results for input(s): "LIPASE", "AMYLASE" in the last 168 hours. No results for input(s): "AMMONIA" in the last 168 hours. CBC: Recent Labs  Lab 04/18/22 0002 04/18/22 0522 04/20/22 0904 04/20/22 1445 04/21/22 0410 04/21/22 1333 04/22/22 0350 04/23/22 0347 04/24/22 0327  WBC 9.4   < > 8.0  --  11.8*  --  10.3 9.9 9.2  NEUTROABS 5.9  --   --   --   --   --   --   --   --   HGB 10.8*   < > 10.5*   < > 7.5* 6.8* 7.2* 7.5* 7.6*  HCT 32.6*   < > 32.5*   < > 23.0* 20.9* 22.0* 22.7* 23.5*  MCV 92.1   < > 91.5  --  93.1  --  92.8 93.8 95.1  PLT 312   < > 293  --  229  --  198 200 237   < > = values in this interval not displayed.   Cardiac Enzymes: No results for input(s): "CKTOTAL", "CKMB", "CKMBINDEX", "TROPONINI" in the last 168 hours. BNP: Invalid input(s): "POCBNP" CBG: Recent Labs  Lab 04/23/22 0809 04/23/22 1133 04/23/22 1643 04/23/22 2141 04/24/22 0745  GLUCAP 99 156* 151* 146* 105*   D-Dimer No results for input(s): "DDIMER" in the last 72 hours. Hgb A1c No results for input(s): "HGBA1C" in the last 72 hours. Lipid Profile No results for input(s): "CHOL", "HDL", "LDLCALC", "TRIG", "CHOLHDL", "LDLDIRECT" in the last 72 hours. Thyroid function studies No results for input(s): "TSH", "T4TOTAL", "T3FREE", "THYROIDAB" in the last 72 hours.  Invalid input(s): "FREET3" Anemia work up No results for input(s): "VITAMINB12", "FOLATE", "FERRITIN", "TIBC", "IRON", "RETICCTPCT" in the last 72 hours. Urinalysis    Component Value Date/Time   COLORURINE AMBER (A) 08/25/2021 1833   APPEARANCEUR HAZY (A) 08/25/2021 1833   LABSPEC 1.024 08/25/2021 1833   PHURINE 5.0 08/25/2021 1833   GLUCOSEU >=500 (A) 08/25/2021 1833   HGBUR MODERATE (A) 08/25/2021 1833   BILIRUBINUR NEGATIVE 08/25/2021 1833   KETONESUR 5 (A) 08/25/2021 1833   PROTEINUR >=300 (A) 08/25/2021 1833   UROBILINOGEN 1.0 04/20/2010 1105   NITRITE NEGATIVE 08/25/2021 1833   LEUKOCYTESUR NEGATIVE  08/25/2021 1833   Sepsis Labs Recent Labs  Lab 04/21/22 0410 04/22/22 0350 04/23/22 0347 04/24/22 0327  WBC 11.8* 10.3 9.9 9.2   Microbiology Recent Results (from the past 240 hour(s))  Surgical PCR screen     Status: None   Collection Time: 04/19/22  4:40 PM   Specimen: Nasal Mucosa; Nasal Swab  Result Value Ref Range Status   MRSA, PCR NEGATIVE NEGATIVE Final   Staphylococcus aureus NEGATIVE NEGATIVE Final    Comment: (NOTE) The Xpert SA Assay (FDA approved for NASAL specimens in patients 63 years of age and older), is one component of a comprehensive surveillance program. It is not intended to diagnose infection nor to guide or monitor treatment. Performed at [facility], [address], [place], [place] [address]   Urine Culture     Status: Abnormal   Collection Time: 04/20/22  3:32 PM   Specimen: Urine, Catheterized  Result Value Ref Range Status   Specimen Description   Final    URINE, CATHETERIZED Performed at [facility], [address], [place], [place] [address]    Special Requests   Final    NONE Performed at [facility], [address] [place], [place] [address]    Culture >=100,000 COLONIES/mL  ESCHERICHIA COLI (A)  Final   Report Status 04/23/2022 FINAL  Final   Organism ID, Bacteria ESCHERICHIA COLI (A)  Final      Susceptibility   Escherichia coli - MIC*    AMPICILLIN 8 SENSITIVE Sensitive     CEFAZOLIN <=4 SENSITIVE Sensitive     CEFEPIME <=0.12 SENSITIVE Sensitive     CEFTRIAXONE <=0.25 SENSITIVE Sensitive     CIPROFLOXACIN <=0.25 SENSITIVE Sensitive     GENTAMICIN <=1 SENSITIVE Sensitive     IMIPENEM <=0.25 SENSITIVE Sensitive     NITROFURANTOIN <=16 SENSITIVE Sensitive     TRIMETH/SULFA <=20 SENSITIVE Sensitive     AMPICILLIN/SULBACTAM 4 SENSITIVE Sensitive     PIP/TAZO <=4 SENSITIVE Sensitive     * >=100,000 COLONIES/mL ESCHERICHIA COLI    Please note: You were cared for by a  hospitalist during your hospital stay. Once you are discharged, your primary care physician will handle any further medical issues. Please note that NO REFILLS for any discharge medications will be authorized once you are discharged, as it is imperative that you return to your primary care physician (or establish a relationship with a primary care physician if you do not have one) for your post hospital discharge needs so that they can reassess your need for medications and monitor your lab values.    Time coordinating discharge: 40 minutes  SIGNED:   Burnadette PopAmrit Tarnisha Kachmar, MD  [facility] 04/24/2022, 10:21 AM Pager [phone]  If 7PM-7AM, please contact night-coverage [email or web address] Password [email or web address]

## 2022-04-27 ENCOUNTER — Emergency Department (HOSPITAL_COMMUNITY)
Admission: EM | Admit: 2022-04-27 | Discharge: 2022-04-27 | Disposition: A | Discharge status: Home / Self Care | Payer: Medicare Other | Attending: Emergency Medicine | Admitting: Emergency Medicine

## 2022-04-27 ENCOUNTER — Emergency Department (HOSPITAL_COMMUNITY): Payer: Medicare Other

## 2022-04-27 DIAGNOSIS — E119 Type 2 diabetes mellitus without complications: Secondary | ICD-10-CM | POA: Insufficient documentation

## 2022-04-27 DIAGNOSIS — I10 Essential (primary) hypertension: Secondary | ICD-10-CM | POA: Diagnosis not present

## 2022-04-27 DIAGNOSIS — Z794 Long term (current) use of insulin: Secondary | ICD-10-CM | POA: Diagnosis not present

## 2022-04-27 DIAGNOSIS — S0990XA Unspecified injury of head, initial encounter: Secondary | ICD-10-CM | POA: Diagnosis present

## 2022-04-27 DIAGNOSIS — Z7982 Long term (current) use of aspirin: Secondary | ICD-10-CM | POA: Diagnosis not present

## 2022-04-27 DIAGNOSIS — S065X0A Traumatic subdural hemorrhage without loss of consciousness, initial encounter: Secondary | ICD-10-CM | POA: Diagnosis not present

## 2022-04-27 DIAGNOSIS — M25551 Pain in right hip: Secondary | ICD-10-CM | POA: Insufficient documentation

## 2022-04-27 DIAGNOSIS — W19XXXA Unspecified fall, initial encounter: Secondary | ICD-10-CM

## 2022-04-27 DIAGNOSIS — W06XXXA Fall from bed, initial encounter: Secondary | ICD-10-CM | POA: Diagnosis not present

## 2022-04-27 DIAGNOSIS — S065XAA Traumatic subdural hemorrhage with loss of consciousness status unknown, initial encounter: Secondary | ICD-10-CM

## 2022-04-27 DIAGNOSIS — F0390 Unspecified dementia without behavioral disturbance: Secondary | ICD-10-CM | POA: Diagnosis not present

## 2022-04-27 DIAGNOSIS — Z96641 Presence of right artificial hip joint: Secondary | ICD-10-CM | POA: Insufficient documentation

## 2022-04-27 NOTE — Consult Note (Signed)
Reason for Consult:subdural hematoma Referring Physician: Marie, Chow is an 71 y.o. male.  HPI: whom was found down next to his bed. No loc, reportedly struck head. Head CT read as showing right 110mm subdural hematoma. Called for evaluation. History of dementia.   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   Hypertension     Past Surgical History:  Procedure Laterality Date   AMPUTATION Left 05/30/2019   Procedure: LEFT FOOT 2ND RAY AMPUTATION;  Surgeon: Nadara Mustard, MD;  Location: [facility] OR;  Service: Orthopedics;  Laterality: Left;   AMPUTATION Right 12/28/2021   Procedure: RIGHT 2ND TOE AMPUTATION;  Surgeon: Nadara Mustard, MD;  Location: [facility] OR;  Service: Orthopedics;  Laterality: Right;   TOE AMPUTATION Right    TONSILLECTOMY     TOTAL HIP ARTHROPLASTY Right 04/20/2022   Procedure: TOTAL HIP ARTHROPLASTY ANTERIOR APPROACH;  Surgeon: Samson Frederic, MD;  Location: [facility] ORS;  Service: Orthopedics;  Laterality: Right;    Family History  Problem Relation Age of Onset   Heart Problems Mother    Heart Problems Father     Social History:  reports that he has never smoked. He has never used smokeless tobacco. He reports that he does not currently use alcohol. He reports that he does not use drugs.  Allergies: No Known Allergies  Medications: I have reviewed the patient's current medications.  No results found for this or any previous visit (from the past 48 hour(s)).  CT Cervical Spine Wo Contrast  Result Date: 04/27/2022 CLINICAL DATA:  Neck trauma, intoxicated or obtunded (Age >= 16y) EXAM: CT CERVICAL SPINE WITHOUT CONTRAST TECHNIQUE: Multidetector CT imaging of the cervical spine was performed without intravenous contrast. Multiplanar CT image reconstructions were also generated. RADIATION DOSE REDUCTION: This exam was performed according to the departmental  dose-optimization program which includes automated exposure control, adjustment of the mA and/or kV according to patient size and/or use of iterative reconstruction technique. COMPARISON:  None Available. FINDINGS: Alignment: Minor degenerative listhesis. Skull base and vertebrae: Cervical vertebral body heights are maintained. No acute fracture. Chronic mild upper thoracic vertebral body wedging. Soft tissues and spinal canal: No prevertebral fluid or swelling. No visible canal hematoma. Disc levels: Multilevel degenerative changes are present including disc space narrowing, endplate osteophytes, and facet and uncovertebral hypertrophy. Upper chest: No apical lung mass. Other: Minimal calcified plaque at the common carotid bifurcations. IMPRESSION: No acute cervical spine fracture. Electronically Signed   By: Guadlupe Spanish M.D.   On: 04/27/2022 08:53   CT Head Wo Contrast  Result Date: 04/27/2022 CLINICAL DATA:  Head trauma, moderate-severe EXAM: CT HEAD WITHOUT CONTRAST TECHNIQUE: Contiguous axial images were obtained from the base of the skull through the vertex without intravenous contrast. RADIATION DOSE REDUCTION: This exam was performed according to the departmental dose-optimization program which includes automated exposure control, adjustment of the mA and/or kV according to patient size and/or use of iterative reconstruction technique. COMPARISON:  CT head April 21, 23. FINDINGS: Brain: Approximately 5 mm thick right subdural hematoma which is largely low density but with small areas of intermixed hyperdensity (for example series 5, image 47). Trace acute/recent hemorrhage layering along the right tentorial leaflet. No significant mass effect or midline shift. Small remote lacunar infarct in the right thalamus. Patchy white matter hypodensities, nonspecific but compatible with chronic microvascular ischemic disease. No evidence of acute large vascular territory  infarct. Cerebral atrophy. No  hydrocephalus. Vascular: Calcific intracranial atherosclerosis. Skull: No acute fracture. Sinuses/Orbits: No acute findings. IMPRESSION: Approximately 5 mm thick right subdural hematoma which is largely low density. A few areas of intermixed hyperdensity are compatible with recent/acute hemorrhage. Also, trace acute/recent hemorrhage layering along the right tentorial leaflet. No significant mass effect or midline shift. Findings discussed with Dr. Alexandria Lodge via telephone at 8:12 AM. Electronically Signed   By: Feliberto Harts M.D.   On: 04/27/2022 08:12   DG Hip Unilat W or Wo Pelvis 2-3 Views Right  Result Date: 04/27/2022 CLINICAL DATA:  Post fall this morning, pain in RIGHT hip. Hip replacement 7 days ago. EXAM: DG HIP (WITH OR WITHOUT PELVIS) 2-3V RIGHT COMPARISON:  Postoperative imaging from April 20, 2022. FINDINGS: Osteopenia.  No sign of pelvic fracture. RIGHT hip arthroplasty changes. Small amount of gas remaining in the soft tissues but diminished from previous imaging with skin staples over the site. No visible fracture. No sign of dislocation. IMPRESSION: Signs of recent RIGHT hip arthroplasty.  No acute finding. Electronically Signed   By: Donzetta Kohut M.D.   On: 04/27/2022 08:03    Review of Systems Blood pressure 132/75, pulse 69, temperature 97.8 F (36.6 C), resp. rate 16, SpO2 96 %. Physical Exam Constitutional:      Comments: Appears much older than stated age  HENT:     Head: Normocephalic.     Right Ear: External ear normal.     Left Ear: External ear normal.     Nose: Nose normal.     Mouth/Throat:     Mouth: Mucous membranes are moist.     Pharynx: Oropharynx is clear.  Eyes:     Extraocular Movements: Extraocular movements intact.     Conjunctiva/sclera: Conjunctivae normal.     Pupils: Pupils are equal, round, and reactive to light.  Cardiovascular:     Rate and Rhythm: Normal rate and regular rhythm.  Pulmonary:     Effort: Pulmonary effort is normal.   Abdominal:     General: Abdomen is flat.     Palpations: Abdomen is soft.  Musculoskeletal:     Cervical back: Normal range of motion.  Neurological:     General: No focal deficit present.     Mental Status: He is alert and oriented to person, place, and time. Mental status is at baseline.     Cranial Nerves: No cranial nerve deficit.     Sensory: No sensory deficit.     Motor: No weakness.     Coordination: Coordination normal.     Deep Tendon Reflexes: Reflexes normal.  Psychiatric:        Mood and Affect: Mood normal.        Behavior: Behavior normal.        Thought Content: Thought content normal.        Judgment: Judgment normal.     Assessment/Plan: Roby Donaway is a 71 y.o. male With a small subdural collection on the right side. There is no shift, basal cisterns widely patent. No operative indications, nor is there a need for a repeat scan. Ok to discharge with supervision.   Coletta Memos 04/27/2022, 9:15 AM

## 2022-04-27 NOTE — ED Notes (Signed)
Patient was given a [place] Sandwich and a cup of Coke.

## 2022-04-27 NOTE — ED Provider Notes (Signed)
[facility] EMERGENCY DEPARTMENT Provider Note   CSN: [id number] Arrival date & time: 04/27/22  3016     History  Chief Complaint  Patient presents with   Bryan Farrell    Bryan Farrell is a 71 y.o. male with medical history of hypertension, diabetes, dementia.  Patient presents to ED for evaluation of fall.  Patient reports that this morning he was trying position himself in bed when he fell out of bed striking the right side of his head on the wall.  The patient denies loss of consciousness, denies blood thinners, denies back pain, denies neck pain, denies headache.  Patient also complaining of right-sided hip pain.  Patient recently had right-sided hip replacement with Dr. Linna Caprice on 9/7 however on my interview he does not remember this.   Fall Pertinent negatives include no headaches.       Home Medications Prior to Admission medications   Medication Sig Start Date End Date Taking? Authorizing Provider  acetaminophen (TYLENOL) 325 MG tablet Take 650 mg by mouth in the morning.    [provider]  aspirin (ASPIRIN CHILDRENS) 81 MG chewable tablet Chew 1 tablet (81 mg total) by mouth 2 (two) times daily with a meal. 04/21/22 06/05/22  Hill, Alain Honey, PA-C  atorvastatin (LIPITOR) 40 MG tablet Take 40 mg by mouth at bedtime. 03/26/20   [provider]  buPROPion (WELLBUTRIN) 100 MG tablet Take 100 mg by mouth in the morning and at bedtime.    [provider]  chlorhexidine (PERIDEX) 0.12 % solution 15 mLs by Mouth Rinse route in the morning.    [provider]  cholecalciferol (VITAMIN D) 25 MCG (1000 UNIT) tablet Take 1,000 Units by mouth daily. 02/12/20   [provider]  divalproex (DEPAKOTE) 125 MG DR tablet Take 125 mg by mouth at bedtime. 08/21/21   [provider]  feeding supplement, GLUCERNA SHAKE, (GLUCERNA SHAKE) LIQD Take 237 mLs by mouth 3 (three) times daily after meals.    [provider]  ferrous  sulfate 325 (65 FE) MG tablet Take 1 tablet (325 mg total) by mouth daily with breakfast. 04/25/22   Burnadette Pop, MD  gabapentin (NEURONTIN) 100 MG capsule Take 1 capsule (100 mg total) by mouth 3 (three) times daily. 04/16/19   Benjiman Core, MD  HYDROcodone-acetaminophen (NORCO) 10-325 MG tablet Take 0.5 tablets by mouth every 4 (four) hours as needed for up to 7 days. 04/21/22 04/28/22  Clois Dupes, PA-C  insulin glargine (LANTUS) 100 UNIT/ML injection Inject 0.05 mLs (5 Units total) into the skin at bedtime. 06/03/19   Christian, Rylee, MD  lidocaine 4 % Place 1 patch onto the skin daily.    [provider]  Multiple Vitamins-Minerals (MULTIVITAMIN WITH MINERALS) tablet Take 1 tablet by mouth daily.    [provider]  ondansetron (ZOFRAN) 4 MG tablet Take 4 mg by mouth every 8 (eight) hours as needed for nausea or vomiting.    [provider]  polyethylene glycol powder (GLYCOLAX/MIRALAX) 17 GM/SCOOP powder Take 17 g by mouth in the morning and at bedtime.    [provider]  scopolamine (TRANSDERM-SCOP) 1 MG/3DAYS Place 1 patch onto the skin every three (3) days as needed (for motion sickness with emesis- "apply 4 hours before OOB with therapy").    [provider]  sennosides-docusate sodium (SENOKOT-S) 8.6-50 MG tablet Take 1-2 tablets by mouth See admin instructions. Take 1 tablet by mouth in the morning and 2 tablets at  5 PM daily    [provider]  sertraline (ZOLOFT) 100 MG tablet Take 200 mg by mouth daily. 03/20/20   [provider]  TRULICITY 1.5 MG/0.5ML SOPN Inject 1.5 mg into the skin every Saturday. 04/17/20   [provider]      Allergies    Patient has no known allergies.    Review of Systems   Review of Systems  Musculoskeletal:  Positive for arthralgias. Negative for back pain and neck pain.  Neurological:  Negative for syncope and headaches.    Physical Exam Updated Vital Signs BP 132/75 (BP  Location: Right Arm)   Pulse 69   Temp 97.8 F (36.6 C)   Resp 16   SpO2 96%  Physical Exam Vitals and nursing note reviewed.  Constitutional:      General: He is not in acute distress.    Appearance: He is not ill-appearing, toxic-appearing or diaphoretic.     Comments: Appears much older than stated age  HENT:     Head: Normocephalic and atraumatic.     Comments: No facial trauma, no superficial abrasions.    Mouth/Throat:     Mouth: Mucous membranes are moist.     Pharynx: Oropharynx is clear.  Eyes:     Extraocular Movements: Extraocular movements intact.     Conjunctiva/sclera: Conjunctivae normal.     Pupils: Pupils are equal, round, and reactive to light.  Cardiovascular:     Rate and Rhythm: Normal rate and regular rhythm.  Pulmonary:     Effort: Pulmonary effort is normal.     Breath sounds: Normal breath sounds. No wheezing.  Abdominal:     General: Abdomen is flat. Bowel sounds are normal.     Palpations: Abdomen is soft.     Tenderness: There is no abdominal tenderness.  Musculoskeletal:     Cervical back: Normal range of motion and neck supple. No tenderness.  Skin:    General: Skin is warm and dry.     Capillary Refill: Capillary refill takes less than 2 seconds.       Neurological:     General: No focal deficit present.     Mental Status: He is alert.     GCS: GCS eye subscore is 4. GCS verbal subscore is 5. GCS motor subscore is 6.     Cranial Nerves: Cranial nerves 2-12 are intact. No cranial nerve deficit.     Sensory: Sensation is intact. No sensory deficit.     Motor: Motor function is intact. No weakness.     Coordination: Coordination is intact. Heel to [facility] Test normal.     Comments: Oriented to self only     ED Results / Procedures / Treatments   Labs (all labs ordered are listed, but only abnormal results are displayed) Labs Reviewed - No data to display  EKG None  Radiology CT Cervical Spine Wo Contrast  Result Date:  04/27/2022 CLINICAL DATA:  Neck trauma, intoxicated or obtunded (Age >= 16y) EXAM: CT CERVICAL SPINE WITHOUT CONTRAST TECHNIQUE: Multidetector CT imaging of the cervical spine was performed without intravenous contrast. Multiplanar CT image reconstructions were also generated. RADIATION DOSE REDUCTION: This exam was performed according to the departmental dose-optimization program which includes automated exposure control, adjustment of the mA and/or kV according to patient size and/or use of iterative reconstruction technique. COMPARISON:  None Available. FINDINGS: Alignment: Minor degenerative listhesis. Skull base and vertebrae: Cervical vertebral body heights are maintained. No acute fracture. Chronic mild upper thoracic vertebral  body wedging. Soft tissues and spinal canal: No prevertebral fluid or swelling. No visible canal hematoma. Disc levels: Multilevel degenerative changes are present including disc space narrowing, endplate osteophytes, and facet and uncovertebral hypertrophy. Upper chest: No apical lung mass. Other: Minimal calcified plaque at the common carotid bifurcations. IMPRESSION: No acute cervical spine fracture. Electronically Signed   By: Guadlupe Spanish M.D.   On: 04/27/2022 08:53   CT Head Wo Contrast  Result Date: 04/27/2022 CLINICAL DATA:  Head trauma, moderate-severe EXAM: CT HEAD WITHOUT CONTRAST TECHNIQUE: Contiguous axial images were obtained from the base of the skull through the vertex without intravenous contrast. RADIATION DOSE REDUCTION: This exam was performed according to the departmental dose-optimization program which includes automated exposure control, adjustment of the mA and/or kV according to patient size and/or use of iterative reconstruction technique. COMPARISON:  CT head April 21, 23. FINDINGS: Brain: Approximately 5 mm thick right subdural hematoma which is largely low density but with small areas of intermixed hyperdensity (for example series 5, image 47). Trace  acute/recent hemorrhage layering along the right tentorial leaflet. No significant mass effect or midline shift. Small remote lacunar infarct in the right thalamus. Patchy white matter hypodensities, nonspecific but compatible with chronic microvascular ischemic disease. No evidence of acute large vascular territory infarct. Cerebral atrophy. No hydrocephalus. Vascular: Calcific intracranial atherosclerosis. Skull: No acute fracture. Sinuses/Orbits: No acute findings. IMPRESSION: Approximately 5 mm thick right subdural hematoma which is largely low density. A few areas of intermixed hyperdensity are compatible with recent/acute hemorrhage. Also, trace acute/recent hemorrhage layering along the right tentorial leaflet. No significant mass effect or midline shift. Findings discussed with Dr. Alexandria Lodge via telephone at 8:12 AM. Electronically Signed   By: Feliberto Harts M.D.   On: 04/27/2022 08:12   DG Hip Unilat W or Wo Pelvis 2-3 Views Right  Result Date: 04/27/2022 CLINICAL DATA:  Post fall this morning, pain in RIGHT hip. Hip replacement 7 days ago. EXAM: DG HIP (WITH OR WITHOUT PELVIS) 2-3V RIGHT COMPARISON:  Postoperative imaging from April 20, 2022. FINDINGS: Osteopenia.  No sign of pelvic fracture. RIGHT hip arthroplasty changes. Small amount of gas remaining in the soft tissues but diminished from previous imaging with skin staples over the site. No visible fracture. No sign of dislocation. IMPRESSION: Signs of recent RIGHT hip arthroplasty.  No acute finding. Electronically Signed   By: Donzetta Kohut M.D.   On: 04/27/2022 08:03    Procedures Procedures   Medications Ordered in ED Medications - No data to display  ED Course/ Medical Decision Making/ A&P Clinical Course as of 04/27/22 1029  Thu Apr 27, 2022  0820 70YOM not on thinners coming in for fall. New right sided subdural hematoma, no neurodeficits.  [CG]  0847 Chronically ill patient not on blood thinners recently had a right hip  replacement coming in after a fall from his bed at the nursing facility.  CT head does show right-sided small subdural hematoma no shift.  He is awake alert and oriented x3, not situation but that is consistent with his baseline dementia.  He is moving all extremities.  There are no focal deficits.  Neurosurgery has been consulted for evaluation. [VB]    Clinical Course User Index [CG] Al Decant, PA-C [VB] Mardene Sayer, MD                           Medical Decision Making Amount and/or Complexity of Data Reviewed Radiology: ordered.  71 year old male presents to the ED for evaluation.  Please see HPI for further details.  On examination patient has no superficial abrasions or trauma to his face.  The patient is afebrile and nontachycardic.  The patient lung sounds are clear bilaterally, he is not hypoxic.  Patient abdomen soft and compressible throughout.  Patient oriented to self which is his baseline.  Patient has a well-appearing surgical scar to his right hip consistent with anterior approach.  The incision is clean dry and intact.  There is no shortening or rotation to the patient right leg.  The patient is complaining of right hip pain however denies any headache, neck pain, back pain.  The patient neurological examination shows no focal neurodeficits.  Due to patient baseline dementia we will proceed with CT head, CT cervical spine, right hip film.  Plain film imaging of patient right hip shows stable right-sided hip replacement.  Patient CT cervical spine shows no acute pathology.  The patient CT head without contrast shows right-sided subdural hematoma.  Due to right-sided subdural hematoma, neurosurgery was consulted.  Dr. Franky Macho came down and saw this patient and states that there is nothing to do from a neurosurgical standpoint, no need for reimaging.  I attempted to get in touch with this patient's healthcare power of attorney, his son, at the number listed in his  demographic form.  I attempted to call the patient's son twice to discuss imaging findings however the patient son did not answer either of my phone calls.   At this time, the patient is stable for discharge.  The patient will have return precautions written on his discharge paperwork.  I attempted to call the patient's facility to discuss the patient findings with healthcare provider however no one was available for me to speak with, the phone continue to ring and no one answered after 5 minutes.  Patient stable for discharge.  Final Clinical Impression(s) / ED Diagnoses Final diagnoses:  Fall, initial encounter  Subdural hematoma [facility])    Rx / DC Orders ED Discharge Orders     None         Clent Ridges 04/27/22 1030    Mardene Sayer, MD 04/27/22 2012

## 2022-04-27 NOTE — ED Notes (Signed)
Nurse attempted to call facility to give report prior to transport, no answer

## 2022-04-27 NOTE — Discharge Instructions (Addendum)
Please return to the ED with any new or worsening symptoms such as facial droop, slurred speech, one-sided weakness or numbness You were seen here today for a fall.  The CT scan of your head shows that you do have a subdural hematoma on the right side.  Neurosurgery was consulted, Dr. Franky Macho states that from a neurosurgical standpoint this is nonoperable.  Dr. Franky Macho stated that this patient did not need to have his head reimaged.  If this patient begins to display any of the above listed symptoms, please have this patient return to the ED for further management. Please read attached guide concerning intracerebral hemorrhage.

## 2022-04-27 NOTE — ED Notes (Signed)
[facility] called to transport patient

## 2022-04-27 NOTE — ED Triage Notes (Signed)
Pt bib [facility] from [facility] after falling out of bed when trying to reposition himself. Pt hit head, no LOC reported, no thinners. Recent hip surgery, c/o R hip pain. Dementia, currently axo x4.

## 2023-03-21 ENCOUNTER — Inpatient Hospital Stay: Admit: 2023-03-21 | Payer: Medicare Other | Admitting: Orthopedic Surgery

## 2023-03-21 ENCOUNTER — Inpatient Hospital Stay (HOSPITAL_COMMUNITY): Payer: Medicare Other

## 2023-03-21 ENCOUNTER — Encounter (HOSPITAL_COMMUNITY): Payer: Self-pay | Admitting: Family Medicine

## 2023-03-21 ENCOUNTER — Inpatient Hospital Stay (HOSPITAL_COMMUNITY)
Admission: AD | Admit: 2023-03-21 | Discharge: 2023-03-27 | DRG: 482 | Disposition: A | Discharge status: Skilled Nursing Facility | Payer: Medicare Other | Source: Ambulatory Visit | Attending: Internal Medicine | Admitting: Internal Medicine

## 2023-03-21 ENCOUNTER — Encounter (HOSPITAL_COMMUNITY): Payer: Self-pay

## 2023-03-21 ENCOUNTER — Other Ambulatory Visit: Payer: Self-pay

## 2023-03-21 DIAGNOSIS — S72002A Fracture of unspecified part of neck of left femur, initial encounter for closed fracture: Secondary | ICD-10-CM | POA: Diagnosis present

## 2023-03-21 DIAGNOSIS — Z993 Dependence on wheelchair: Secondary | ICD-10-CM | POA: Diagnosis not present

## 2023-03-21 DIAGNOSIS — E785 Hyperlipidemia, unspecified: Secondary | ICD-10-CM | POA: Diagnosis present

## 2023-03-21 DIAGNOSIS — Z79899 Other long term (current) drug therapy: Secondary | ICD-10-CM | POA: Diagnosis not present

## 2023-03-21 DIAGNOSIS — R0902 Hypoxemia: Secondary | ICD-10-CM | POA: Diagnosis present

## 2023-03-21 DIAGNOSIS — Z96641 Presence of right artificial hip joint: Secondary | ICD-10-CM | POA: Diagnosis present

## 2023-03-21 DIAGNOSIS — Z794 Long term (current) use of insulin: Secondary | ICD-10-CM | POA: Diagnosis not present

## 2023-03-21 DIAGNOSIS — Z89421 Acquired absence of other right toe(s): Secondary | ICD-10-CM

## 2023-03-21 DIAGNOSIS — E1142 Type 2 diabetes mellitus with diabetic polyneuropathy: Secondary | ICD-10-CM | POA: Diagnosis present

## 2023-03-21 DIAGNOSIS — F0150 Vascular dementia without behavioral disturbance: Secondary | ICD-10-CM | POA: Diagnosis present

## 2023-03-21 DIAGNOSIS — E1122 Type 2 diabetes mellitus with diabetic chronic kidney disease: Secondary | ICD-10-CM | POA: Diagnosis not present

## 2023-03-21 DIAGNOSIS — N189 Chronic kidney disease, unspecified: Secondary | ICD-10-CM | POA: Diagnosis not present

## 2023-03-21 DIAGNOSIS — W06XXXA Fall from bed, initial encounter: Secondary | ICD-10-CM | POA: Diagnosis present

## 2023-03-21 DIAGNOSIS — F01C18 Vascular dementia, severe, with other behavioral disturbance: Secondary | ICD-10-CM | POA: Diagnosis not present

## 2023-03-21 DIAGNOSIS — Z89422 Acquired absence of other left toe(s): Secondary | ICD-10-CM

## 2023-03-21 DIAGNOSIS — S72002D Fracture of unspecified part of neck of left femur, subsequent encounter for closed fracture with routine healing: Secondary | ICD-10-CM | POA: Diagnosis not present

## 2023-03-21 DIAGNOSIS — I129 Hypertensive chronic kidney disease with stage 1 through stage 4 chronic kidney disease, or unspecified chronic kidney disease: Secondary | ICD-10-CM | POA: Diagnosis not present

## 2023-03-21 DIAGNOSIS — R7989 Other specified abnormal findings of blood chemistry: Secondary | ICD-10-CM | POA: Diagnosis present

## 2023-03-21 DIAGNOSIS — F4321 Adjustment disorder with depressed mood: Secondary | ICD-10-CM | POA: Diagnosis present

## 2023-03-21 DIAGNOSIS — F32A Depression, unspecified: Secondary | ICD-10-CM | POA: Diagnosis present

## 2023-03-21 DIAGNOSIS — I10 Essential (primary) hypertension: Secondary | ICD-10-CM | POA: Diagnosis present

## 2023-03-21 LAB — CBC
HCT: 31 % — ABNORMAL LOW (ref 39.0–52.0)
Hemoglobin: 9.8 g/dL — ABNORMAL LOW (ref 13.0–17.0)
MCH: 29.7 pg (ref 26.0–34.0)
MCHC: 31.6 g/dL (ref 30.0–36.0)
MCV: 93.9 fL (ref 80.0–100.0)
Platelets: 225 10*3/uL (ref 150–400)
RBC: 3.3 MIL/uL — ABNORMAL LOW (ref 4.22–5.81)
RDW: 15.5 % (ref 11.5–15.5)
WBC: 12.7 10*3/uL — ABNORMAL HIGH (ref 4.0–10.5)
nRBC: 0 % (ref 0.0–0.2)

## 2023-03-21 LAB — PHOSPHORUS: Phosphorus: 4.1 mg/dL (ref 2.5–4.6)

## 2023-03-21 LAB — HIV ANTIBODY (ROUTINE TESTING W REFLEX): HIV Screen 4th Generation wRfx: NONREACTIVE

## 2023-03-21 LAB — BASIC METABOLIC PANEL
Anion gap: 9 (ref 5–15)
BUN: 24 mg/dL — ABNORMAL HIGH (ref 8–23)
CO2: 25 mmol/L (ref 22–32)
Calcium: 8.4 mg/dL — ABNORMAL LOW (ref 8.9–10.3)
Chloride: 102 mmol/L (ref 98–111)
Creatinine, Ser: 1.37 mg/dL — ABNORMAL HIGH (ref 0.61–1.24)
GFR, Estimated: 56 mL/min — ABNORMAL LOW (ref >60)
Glucose, Bld: 170 mg/dL — ABNORMAL HIGH (ref 70–99)
Potassium: 4.5 mmol/L (ref 3.5–5.1)
Sodium: 136 mmol/L (ref 135–145)

## 2023-03-21 LAB — GLUCOSE, CAPILLARY
Glucose-Capillary: 159 mg/dL — ABNORMAL HIGH (ref 70–99)
Glucose-Capillary: 190 mg/dL — ABNORMAL HIGH (ref 70–99)

## 2023-03-21 LAB — MAGNESIUM: Magnesium: 1.8 mg/dL (ref 1.7–2.4)

## 2023-03-21 LAB — HEMOGLOBIN A1C
Hgb A1c MFr Bld: 7.5 % — ABNORMAL HIGH (ref 4.8–5.6)
Mean Plasma Glucose: 168.55 mg/dL

## 2023-03-21 MED ORDER — TRAMADOL HCL 50 MG PO TABS
50.0000 mg | ORAL_TABLET | Freq: Three times a day (TID) | ORAL | Status: DC
  Administered 2023-03-21 – 2023-03-27 (×18): 50 mg via ORAL
  Filled 2023-03-21 (×18): qty 1

## 2023-03-21 MED ORDER — INSULIN GLARGINE-YFGN 100 UNIT/ML ~~LOC~~ SOLN
10.0000 [IU] | Freq: Every day | SUBCUTANEOUS | Status: DC
  Administered 2023-03-21 – 2023-03-26 (×6): 10 [IU] via SUBCUTANEOUS
  Filled 2023-03-21 (×8): qty 0.1

## 2023-03-21 MED ORDER — FERROUS SULFATE 325 (65 FE) MG PO TABS
325.0000 mg | ORAL_TABLET | Freq: Every day | ORAL | Status: DC
  Administered 2023-03-22 – 2023-03-27 (×6): 325 mg via ORAL
  Filled 2023-03-21 (×6): qty 1

## 2023-03-21 MED ORDER — SODIUM CHLORIDE 0.9 % IV SOLN: INTRAVENOUS | Status: DC

## 2023-03-21 MED ORDER — ONDANSETRON HCL 4 MG/2ML IJ SOLN: 4.0000 mg | Freq: Four times a day (QID) | INTRAMUSCULAR | Status: DC | PRN

## 2023-03-21 MED ORDER — ACETAMINOPHEN 325 MG PO TABS: 650.0000 mg | ORAL_TABLET | Freq: Four times a day (QID) | ORAL | Status: DC | PRN

## 2023-03-21 MED ORDER — DOCUSATE SODIUM 100 MG PO CAPS
100.0000 mg | ORAL_CAPSULE | Freq: Two times a day (BID) | ORAL | Status: DC
  Administered 2023-03-21 – 2023-03-22 (×2): 100 mg via ORAL
  Filled 2023-03-21 (×2): qty 1

## 2023-03-21 MED ORDER — DIVALPROEX SODIUM 125 MG PO DR TAB
125.0000 mg | DELAYED_RELEASE_TABLET | Freq: Three times a day (TID) | ORAL | Status: DC
  Administered 2023-03-21 – 2023-03-27 (×18): 125 mg via ORAL
  Filled 2023-03-21 (×20): qty 1

## 2023-03-21 MED ORDER — ONDANSETRON HCL 4 MG PO TABS: 4.0000 mg | ORAL_TABLET | Freq: Four times a day (QID) | ORAL | Status: DC | PRN

## 2023-03-21 MED ORDER — INSULIN ASPART 100 UNIT/ML IJ SOLN
0.0000 [IU] | Freq: Three times a day (TID) | INTRAMUSCULAR | Status: DC
  Administered 2023-03-23: 1 [IU] via SUBCUTANEOUS

## 2023-03-21 MED ORDER — ACETAMINOPHEN 650 MG RE SUPP: 650.0000 mg | Freq: Four times a day (QID) | RECTAL | Status: DC | PRN

## 2023-03-21 MED ORDER — GABAPENTIN 100 MG PO CAPS
100.0000 mg | ORAL_CAPSULE | Freq: Three times a day (TID) | ORAL | Status: DC
  Administered 2023-03-21 – 2023-03-27 (×18): 100 mg via ORAL
  Filled 2023-03-21 (×18): qty 1

## 2023-03-21 MED ORDER — ENOXAPARIN SODIUM 30 MG/0.3ML IJ SOSY
30.0000 mg | PREFILLED_SYRINGE | INTRAMUSCULAR | Status: DC
  Administered 2023-03-21 – 2023-03-22 (×2): 30 mg via SUBCUTANEOUS
  Filled 2023-03-21 (×2): qty 0.3

## 2023-03-21 MED ORDER — ATORVASTATIN CALCIUM 40 MG PO TABS
40.0000 mg | ORAL_TABLET | Freq: Every day | ORAL | Status: DC
  Administered 2023-03-21 – 2023-03-26 (×6): 40 mg via ORAL
  Filled 2023-03-21 (×6): qty 1

## 2023-03-21 NOTE — H&P (Addendum)
History and Physical    [name] NWG:[id number] DOB: [birth year] DOA: 03/21/2023  PCP: Eloisa Northern, MD   Patient coming from: Dr. Greig Right Office  I have personally briefly reviewed patient's old medical records in [facility] Link  Chief Complaint: s/p Fall with left hip fracture.  HPI: Bryan Farrell is a 72 y.o. male with PMH significant for diabetes mellitus, vascular dementia, depression, adjustment disorder, wheelchair-bound lives at [facility] health assisted living facility presented in the hospital from Dr. Greig Right office as direct admit for left hip fracture.  Patient states while getting out of bed to the wheelchair , he slipped from the bed and fell on his left hip and he has developed pain.  X-ray was done at the facility and appointment was made with Dr. Eulah Pont in the office.  Patient reports having pain while movement in the bed , otherwise denies any pain. He went to see Dr. Eulah Pont in the office today,  found to have left femoral neck fracture and was sent as a direct admit for possible ORIF tomorrow.  ED Course: He is hemodynamically stable.  Labs ordered and pending.  Review of Systems:   Review of Systems  Constitutional: Negative.   HENT: Negative.    Eyes: Negative.   Respiratory: Negative.    Cardiovascular: Negative.   Gastrointestinal: Negative.   Genitourinary: Negative.   Musculoskeletal:        Left hip and thigh pain.  Restricted movement  Skin: Negative.   Neurological: Negative.   Endo/Heme/Allergies: Negative.   Psychiatric/Behavioral:  Positive for depression. The patient is nervous/anxious.      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   Hypertension     Past Surgical History:  Procedure Laterality Date   AMPUTATION Left 05/30/2019   Procedure: LEFT FOOT 2ND RAY AMPUTATION;  Surgeon: Nadara Mustard, MD;  Location: [facility] OR;  Service:  Orthopedics;  Laterality: Left;   AMPUTATION Right 12/28/2021   Procedure: RIGHT 2ND TOE AMPUTATION;  Surgeon: Nadara Mustard, MD;  Location: [facility] OR;  Service: Orthopedics;  Laterality: Right;   TOE AMPUTATION Right    TONSILLECTOMY     TOTAL HIP ARTHROPLASTY Right 04/20/2022   Procedure: TOTAL HIP ARTHROPLASTY ANTERIOR APPROACH;  Surgeon: Samson Frederic, MD;  Location: [facility] ORS;  Service: Orthopedics;  Laterality: Right;     reports that he has never smoked. He has never used smokeless tobacco. He reports that he does not currently use alcohol. He reports that he does not use drugs.  No Known Allergies  Family History  Problem Relation Age of Onset   Heart Problems Mother    Heart Problems Father     Family history reviewed and not pertinent.  Prior to Admission medications   Medication Sig Start Date End Date Taking? Authorizing Provider  acetaminophen (TYLENOL) 500 MG tablet Take 500 mg by mouth every 6 (six) hours as needed (Chronic pain).    [provider]  atorvastatin (LIPITOR) 40 MG tablet Take 40 mg by mouth at bedtime. 03/26/20   [provider]  carboxymethylcellulose 1 % ophthalmic solution Place 1 drop into both eyes 3 (three) times daily.    [provider]  chlorhexidine (PERIDEX) 0.12 % solution 15 mLs by Mouth Rinse route in the morning.    [provider]  cholecalciferol (VITAMIN D) 25 MCG (1000 UNIT) tablet Take 1,000 Units by mouth daily.  02/12/20   [provider]  divalproex (DEPAKOTE) 125 MG DR tablet Take 125 mg by mouth 3 (three) times daily. 08/21/21   [provider]  feeding supplement, GLUCERNA SHAKE, (GLUCERNA SHAKE) LIQD Take 237 mLs by mouth 3 (three) times daily after meals.    [provider]  ferrous sulfate 325 (65 FE) MG tablet Take 1 tablet (325 mg total) by mouth daily with breakfast. 04/25/22   Burnadette Pop, MD  gabapentin (NEURONTIN) 100 MG capsule Take 1 capsule (100 mg total) by mouth 3  (three) times daily. 04/16/19   Benjiman Core, MD  lidocaine 4 % Place 1 patch onto the skin daily.    [provider]  Multiple Vitamins-Minerals (MULTIVITAMIN WITH MINERALS) tablet Take 1 tablet by mouth daily.    [provider]  mupirocin ointment (BACTROBAN) 2 % Apply 1 Application topically daily. Apply to R lateral thigh topically. Clean abrasion with wound cleanser, apply and calcium alginate cover with bordered gauz 01/10/23   [provider]  polyethylene glycol powder (GLYCOLAX/MIRALAX) 17 GM/SCOOP powder Take 17 g by mouth in the morning and at bedtime.    [provider]  sennosides-docusate sodium (SENOKOT-S) 8.6-50 MG tablet Take 1-2 tablets by mouth See admin instructions. Take 1 tablet by mouth in the morning and 2 tablets at 5 PM daily    [provider]  sertraline (ZOLOFT) 100 MG tablet Take 100 mg by mouth See admin instructions. Take with 50 mg for a total of 150 mg in the morning 03/20/20   [provider]  sertraline (ZOLOFT) 50 MG tablet Take 50 mg by mouth See admin instructions. Take with 100 mg for a total of 150 mg in the morning 03/01/23   [provider]  TOUJEO SOLOSTAR 300 UNIT/ML Solostar Pen Inject 10 Units into the skin at bedtime. 03/10/23   [provider]  traMADol (ULTRAM) 50 MG tablet Take 50 mg by mouth 3 (three) times daily. 03/20/23   [provider]    Physical Exam: Vitals:   03/21/23 1524 03/21/23 1800  BP: 138/69   Pulse: 74   Resp: 17   Temp: 98.1 F (36.7 C)   TempSrc: Oral   SpO2: 94%   Weight:  88.5 kg  Height:  6' (1.829 m)    Constitutional: He is comfortable, deconditioned, not in any acute distress. Vitals:   03/21/23 1524 03/21/23 1800  BP: 138/69   Pulse: 74   Resp: 17   Temp: 98.1 F (36.7 C)   TempSrc: Oral   SpO2: 94%   Weight:  88.5 kg  Height:  6' (1.829 m)   Eyes: PERRL, lids and conjunctivae normal ENMT: Mucous membranes are moist.  Posterior pharynx clear of any exudate or lesions. Neck: normal, supple, no masses, no thyromegaly Respiratory: CTA bilaterally, no wheezing, no crackles. Normal respiratory effort. No accessory muscle use.  Cardiovascular: S1-S2 heard, regular rate and rhythm, no murmurs / rubs / gallops. No carotid bruits.  Abdomen: Soft, no tenderness, no masses palpated. No hepatosplenomegaly. Bowel sounds positive.  Musculoskeletal: Left thigh and hip tenderness noted, restricted movements.   Normal muscle tone.  Skin: no rashes, lesions, ulcers. No induration Neurologic: CN 2-12 grossly intact. Sensation intact, DTR normal. Strength 5/5 in all 4.  Psychiatric: Normal judgment and insight. Alert and oriented x 2. Normal mood.    Labs on Admission: I have personally reviewed following labs and imaging studies  CBC: Recent Labs  Lab 03/21/23 1626  WBC  12.7*  HGB 9.8*  HCT 31.0*  MCV 93.9  PLT 225   Basic Metabolic Panel: Recent Labs  Lab 03/21/23 1626  NA 136  K 4.5  CL 102  CO2 25  GLUCOSE 170*  BUN 24*  CREATININE 1.37*  CALCIUM 8.4*  MG 1.8  PHOS 4.1   GFR: Estimated Creatinine Clearance: 55.9 mL/min (A) (by C-G formula based on SCr of 1.37 mg/dL (H)). Liver Function Tests: No results for input(s): "AST", "ALT", "ALKPHOS", "BILITOT", "PROT", "ALBUMIN" in the last 168 hours. No results for input(s): "LIPASE", "AMYLASE" in the last 168 hours. No results for input(s): "AMMONIA" in the last 168 hours. Coagulation Profile: No results for input(s): "INR", "PROTIME" in the last 168 hours. Cardiac Enzymes: No results for input(s): "CKTOTAL", "CKMB", "CKMBINDEX", "TROPONINI" in the last 168 hours. BNP (last 3 results) No results for input(s): "PROBNP" in the last 8760 hours. HbA1C: Recent Labs    03/21/23 1626  HGBA1C 7.5*   CBG: Recent Labs  Lab 03/21/23 1536  GLUCAP 159*   Lipid Profile: No results for input(s): "CHOL", "HDL", "LDLCALC", "TRIG", "CHOLHDL", "LDLDIRECT" in  the last 72 hours. Thyroid Function Tests: No results for input(s): "TSH", "T4TOTAL", "FREET4", "T3FREE", "THYROIDAB" in the last 72 hours. Anemia Panel: No results for input(s): "VITAMINB12", "FOLATE", "FERRITIN", "TIBC", "IRON", "RETICCTPCT" in the last 72 hours. Urine analysis:    Component Value Date/Time   COLORURINE AMBER (A) 08/25/2021 1833   APPEARANCEUR HAZY (A) 08/25/2021 1833   LABSPEC 1.024 08/25/2021 1833   PHURINE 5.0 08/25/2021 1833   GLUCOSEU >=500 (A) 08/25/2021 1833   HGBUR MODERATE (A) 08/25/2021 1833   BILIRUBINUR NEGATIVE 08/25/2021 1833   KETONESUR 5 (A) 08/25/2021 1833   PROTEINUR >=300 (A) 08/25/2021 1833   UROBILINOGEN 1.0 04/20/2010 1105   NITRITE NEGATIVE 08/25/2021 1833   LEUKOCYTESUR NEGATIVE 08/25/2021 1833    Radiological Exams on Admission: No results found.  EKG:  EKG ordered.  please review.  Assessment/Plan Principal Problem:   Closed left hip fracture (HCC) Active Problems:   Adjustment disorder with depressed mood   Type 2 diabetes mellitus with peripheral neuropathy (HCC)   Depression   Dyslipidemia   Vascular dementia (HCC)  Left femoral Neck fracture: Patient presented s/p fall , slipped from the bed. X-ray showed left femoral neck fracture as per sign out from ortho.  Admit on the medical floor, Continue adequate pain control with tramadol. Continue IV hydration. Obtain labs and x-ray hip. Orthopedics has scheduled patient for possible ORIF in the morning. N.p.o. midnight.  Adjustment disorder with depressed mood: Continue Zoloft 150 mg daily.  Type 2 diabetes: Hold p.o. diabetic medications. Continue Semglee 10 units daily Continue regular insulin sliding scale.   Obtain hemoglobin A1c  Hyperlipidemia: Continue Lipitor  Vascular dementia: No behavioral problems. Appears at baseline mental status  Peripheral neuropathy Continue gabapentin.  DVT prophylaxis: Lovenox Code Status: Full code Family Communication:  No family at bed side. Disposition Plan:   Status is: Inpatient Remains inpatient appropriate because:Admitted for left femoral neck fracture requiring ORIF.   Consults called: [facility] Dr. Eulah Pont Admission status: Inpatient   Willeen Niece MD [facility]   If 7PM-7AM, please contact night-coverage   03/21/2023, 6:37 PM

## 2023-03-21 NOTE — Progress Notes (Signed)
Son Iantha Fallen notified of patents arrival via phon call

## 2023-03-21 NOTE — Plan of Care (Signed)
  Problem: Clinical Measurements: Goal: Will remain free from infection Outcome: Progressing   Problem: Nutrition: Goal: Adequate nutrition will be maintained Outcome: Progressing   Problem: Elimination: Goal: Will not experience complications related to bowel motility Outcome: Progressing

## 2023-03-21 NOTE — Progress Notes (Signed)
Pt. Arrived to unit via EMS direct admit. No c/o pain alert and oriented x 4.

## 2023-03-21 NOTE — Plan of Care (Signed)
  Problem: Education: Goal: Knowledge of General Education information will improve Description: Including pain rating scale, medication(s)/side effects and non-pharmacologic comfort measures Outcome: Progressing   Problem: Activity: Goal: Risk for activity intolerance will decrease Outcome: Progressing   Problem: Nutrition: Goal: Adequate nutrition will be maintained Outcome: Progressing   Problem: Elimination: Goal: Will not experience complications related to bowel motility Outcome: Progressing   Problem: Pain Managment: Goal: General experience of comfort will improve Outcome: Progressing   

## 2023-03-22 ENCOUNTER — Inpatient Hospital Stay (HOSPITAL_COMMUNITY): Payer: Medicare Other

## 2023-03-22 ENCOUNTER — Inpatient Hospital Stay (HOSPITAL_COMMUNITY): Payer: Medicare Other | Admitting: Anesthesiology

## 2023-03-22 ENCOUNTER — Other Ambulatory Visit: Payer: Self-pay

## 2023-03-22 ENCOUNTER — Encounter (HOSPITAL_COMMUNITY)
Admission: AD | Disposition: A | Discharge status: Skilled Nursing Facility | Payer: Self-pay | Source: Ambulatory Visit | Attending: Internal Medicine

## 2023-03-22 DIAGNOSIS — I129 Hypertensive chronic kidney disease with stage 1 through stage 4 chronic kidney disease, or unspecified chronic kidney disease: Secondary | ICD-10-CM

## 2023-03-22 DIAGNOSIS — F4321 Adjustment disorder with depressed mood: Secondary | ICD-10-CM

## 2023-03-22 DIAGNOSIS — E1142 Type 2 diabetes mellitus with diabetic polyneuropathy: Secondary | ICD-10-CM

## 2023-03-22 DIAGNOSIS — E785 Hyperlipidemia, unspecified: Secondary | ICD-10-CM

## 2023-03-22 DIAGNOSIS — F32A Depression, unspecified: Secondary | ICD-10-CM | POA: Diagnosis not present

## 2023-03-22 DIAGNOSIS — E1122 Type 2 diabetes mellitus with diabetic chronic kidney disease: Secondary | ICD-10-CM

## 2023-03-22 DIAGNOSIS — F01C18 Vascular dementia, severe, with other behavioral disturbance: Secondary | ICD-10-CM

## 2023-03-22 DIAGNOSIS — S72002A Fracture of unspecified part of neck of left femur, initial encounter for closed fracture: Secondary | ICD-10-CM

## 2023-03-22 DIAGNOSIS — N189 Chronic kidney disease, unspecified: Secondary | ICD-10-CM

## 2023-03-22 DIAGNOSIS — S72002D Fracture of unspecified part of neck of left femur, subsequent encounter for closed fracture with routine healing: Secondary | ICD-10-CM | POA: Diagnosis not present

## 2023-03-22 HISTORY — PX: HIP PINNING,CANNULATED: SHX1758

## 2023-03-22 LAB — GLUCOSE, CAPILLARY
Glucose-Capillary: 101 mg/dL — ABNORMAL HIGH (ref 70–99)
Glucose-Capillary: 86 mg/dL (ref 70–99)
Glucose-Capillary: 91 mg/dL (ref 70–99)
Glucose-Capillary: 92 mg/dL (ref 70–99)
Glucose-Capillary: 138 mg/dL — ABNORMAL HIGH (ref 70–99)

## 2023-03-22 SURGERY — FIXATION, FEMUR, NECK, PERCUTANEOUS, USING SCREW
Anesthesia: General | Site: Hip | Laterality: Left

## 2023-03-22 MED ORDER — ONDANSETRON HCL 4 MG PO TABS: 4.0000 mg | ORAL_TABLET | Freq: Four times a day (QID) | ORAL | Status: DC | PRN

## 2023-03-22 MED ORDER — BISACODYL 10 MG RE SUPP: 10.0000 mg | Freq: Every day | RECTAL | Status: DC | PRN

## 2023-03-22 MED ORDER — HYDROCODONE-ACETAMINOPHEN 5-325 MG PO TABS: 1.0000 | ORAL_TABLET | ORAL | Status: DC | PRN

## 2023-03-22 MED ORDER — TRANEXAMIC ACID-NACL 1000-0.7 MG/100ML-% IV SOLN
1000.0000 mg | INTRAVENOUS | Status: AC
  Administered 2023-03-22: 1000 mg via INTRAVENOUS
  Filled 2023-03-22: qty 100

## 2023-03-22 MED ORDER — CEFAZOLIN SODIUM-DEXTROSE 2-4 GM/100ML-% IV SOLN
2.0000 g | INTRAVENOUS | Status: AC
  Administered 2023-03-22: 2 g via INTRAVENOUS
  Filled 2023-03-22: qty 100

## 2023-03-22 MED ORDER — POLYETHYLENE GLYCOL 3350 17 G PO PACK: 17.0000 g | PACK | Freq: Every day | ORAL | Status: DC | PRN

## 2023-03-22 MED ORDER — OXYCODONE HCL 5 MG/5ML PO SOLN: 5.0000 mg | Freq: Once | ORAL | Status: DC | PRN

## 2023-03-22 MED ORDER — CEFAZOLIN SODIUM-DEXTROSE 2-4 GM/100ML-% IV SOLN
2.0000 g | Freq: Four times a day (QID) | INTRAVENOUS | Status: AC
  Administered 2023-03-22 – 2023-03-23 (×2): 2 g via INTRAVENOUS
  Filled 2023-03-22 (×2): qty 100

## 2023-03-22 MED ORDER — CHLORHEXIDINE GLUCONATE 4 % EX SOLN: 60.0000 mL | Freq: Once | CUTANEOUS | Status: DC

## 2023-03-22 MED ORDER — ONDANSETRON HCL 4 MG/2ML IJ SOLN
INTRAMUSCULAR | Status: DC | PRN
  Administered 2023-03-22: 4 mg via INTRAVENOUS

## 2023-03-22 MED ORDER — LIDOCAINE 2% (20 MG/ML) 5 ML SYRINGE
INTRAMUSCULAR | Status: AC
  Filled 2023-03-22: qty 5

## 2023-03-22 MED ORDER — DEXAMETHASONE SODIUM PHOSPHATE 10 MG/ML IJ SOLN
INTRAMUSCULAR | Status: AC
  Filled 2023-03-22: qty 1

## 2023-03-22 MED ORDER — METOCLOPRAMIDE HCL 5 MG PO TABS: 5.0000 mg | ORAL_TABLET | Freq: Three times a day (TID) | ORAL | Status: DC | PRN

## 2023-03-22 MED ORDER — ONDANSETRON HCL 4 MG/2ML IJ SOLN: 4.0000 mg | Freq: Four times a day (QID) | INTRAMUSCULAR | Status: DC | PRN

## 2023-03-22 MED ORDER — HYDROMORPHONE HCL 1 MG/ML IJ SOLN: 0.2500 mg | INTRAMUSCULAR | Status: DC | PRN

## 2023-03-22 MED ORDER — TRANEXAMIC ACID-NACL 1000-0.7 MG/100ML-% IV SOLN
1000.0000 mg | Freq: Once | INTRAVENOUS | Status: AC
  Administered 2023-03-22: 1000 mg via INTRAVENOUS
  Filled 2023-03-22: qty 100

## 2023-03-22 MED ORDER — PROPOFOL 10 MG/ML IV BOLUS
INTRAVENOUS | Status: DC | PRN
Start: 2023-03-22 — End: 2023-03-22
  Administered 2023-03-22: 150 mg via INTRAVENOUS

## 2023-03-22 MED ORDER — DEXAMETHASONE SODIUM PHOSPHATE 10 MG/ML IJ SOLN
8.0000 mg | Freq: Once | INTRAMUSCULAR | Status: DC
  Filled 2023-03-22: qty 1

## 2023-03-22 MED ORDER — DOCUSATE SODIUM 100 MG PO CAPS
100.0000 mg | ORAL_CAPSULE | Freq: Two times a day (BID) | ORAL | Status: DC
  Administered 2023-03-22 – 2023-03-27 (×10): 100 mg via ORAL
  Filled 2023-03-22 (×10): qty 1

## 2023-03-22 MED ORDER — ACETAMINOPHEN 500 MG PO TABS
500.0000 mg | ORAL_TABLET | Freq: Four times a day (QID) | ORAL | Status: AC
  Administered 2023-03-22 – 2023-03-23 (×3): 500 mg via ORAL
  Filled 2023-03-22 (×3): qty 1

## 2023-03-22 MED ORDER — ACETAMINOPHEN 500 MG PO TABS
1000.0000 mg | ORAL_TABLET | Freq: Once | ORAL | Status: AC
  Administered 2023-03-22: 1000 mg via ORAL
  Filled 2023-03-22: qty 2

## 2023-03-22 MED ORDER — 0.9 % SODIUM CHLORIDE (POUR BTL) OPTIME
TOPICAL | Status: DC | PRN
  Administered 2023-03-22: 1000 mL

## 2023-03-22 MED ORDER — MEPERIDINE HCL 25 MG/ML IJ SOLN: 6.2500 mg | INTRAMUSCULAR | Status: DC | PRN

## 2023-03-22 MED ORDER — MENTHOL 3 MG MT LOZG: 1.0000 | LOZENGE | OROMUCOSAL | Status: DC | PRN

## 2023-03-22 MED ORDER — POVIDONE-IODINE 10 % EX SWAB
2.0000 | Freq: Once | CUTANEOUS | Status: AC
  Administered 2023-03-22: 2 via TOPICAL

## 2023-03-22 MED ORDER — HYDROCODONE-ACETAMINOPHEN 7.5-325 MG PO TABS: 1.0000 | ORAL_TABLET | ORAL | Status: DC | PRN

## 2023-03-22 MED ORDER — ORAL CARE MOUTH RINSE: 15.0000 mL | Freq: Once | OROMUCOSAL | Status: AC

## 2023-03-22 MED ORDER — HYDROMORPHONE HCL 1 MG/ML IJ SOLN
INTRAMUSCULAR | Status: AC
  Filled 2023-03-22: qty 0.5

## 2023-03-22 MED ORDER — EPHEDRINE SULFATE-NACL 50-0.9 MG/10ML-% IV SOSY
PREFILLED_SYRINGE | INTRAVENOUS | Status: DC | PRN
  Administered 2023-03-22: 10 mg via INTRAVENOUS

## 2023-03-22 MED ORDER — HYDROMORPHONE HCL 1 MG/ML IJ SOLN
INTRAMUSCULAR | Status: DC | PRN
  Administered 2023-03-22: .5 mg via INTRAVENOUS

## 2023-03-22 MED ORDER — CHLORHEXIDINE GLUCONATE 0.12 % MT SOLN
15.0000 mL | Freq: Once | OROMUCOSAL | Status: AC
  Administered 2023-03-22: 15 mL via OROMUCOSAL
  Filled 2023-03-22: qty 15

## 2023-03-22 MED ORDER — AMISULPRIDE (ANTIEMETIC) 5 MG/2ML IV SOLN: 10.0000 mg | Freq: Once | INTRAVENOUS | Status: DC | PRN

## 2023-03-22 MED ORDER — ONDANSETRON HCL 4 MG/2ML IJ SOLN
INTRAMUSCULAR | Status: AC
  Filled 2023-03-22: qty 2

## 2023-03-22 MED ORDER — METHOCARBAMOL 1000 MG/10ML IJ SOLN: 500.0000 mg | Freq: Four times a day (QID) | INTRAVENOUS | Status: DC | PRN

## 2023-03-22 MED ORDER — LACTATED RINGERS IV SOLN: INTRAVENOUS | Status: DC

## 2023-03-22 MED ORDER — METOCLOPRAMIDE HCL 5 MG/ML IJ SOLN: 5.0000 mg | Freq: Three times a day (TID) | INTRAMUSCULAR | Status: DC | PRN

## 2023-03-22 MED ORDER — METHOCARBAMOL 500 MG PO TABS: 500.0000 mg | ORAL_TABLET | Freq: Four times a day (QID) | ORAL | Status: DC | PRN

## 2023-03-22 MED ORDER — LIDOCAINE HCL (CARDIAC) PF 100 MG/5ML IV SOSY
PREFILLED_SYRINGE | INTRAVENOUS | Status: DC | PRN
  Administered 2023-03-22: 40 mg via INTRATRACHEAL

## 2023-03-22 MED ORDER — MORPHINE SULFATE (PF) 2 MG/ML IV SOLN: 0.5000 mg | INTRAVENOUS | Status: DC | PRN

## 2023-03-22 MED ORDER — PROPOFOL 10 MG/ML IV BOLUS
INTRAVENOUS | Status: AC
  Filled 2023-03-22: qty 20

## 2023-03-22 MED ORDER — MIDAZOLAM HCL 2 MG/2ML IJ SOLN
INTRAMUSCULAR | Status: AC
  Filled 2023-03-22: qty 2

## 2023-03-22 MED ORDER — OXYCODONE HCL 5 MG PO TABS: 5.0000 mg | ORAL_TABLET | Freq: Once | ORAL | Status: DC | PRN

## 2023-03-22 MED ORDER — PROMETHAZINE HCL 25 MG/ML IJ SOLN: 6.2500 mg | INTRAMUSCULAR | Status: DC | PRN

## 2023-03-22 MED ORDER — PHENOL 1.4 % MT LIQD: 1.0000 | OROMUCOSAL | Status: DC | PRN

## 2023-03-22 MED ORDER — DEXAMETHASONE SODIUM PHOSPHATE 10 MG/ML IJ SOLN
INTRAMUSCULAR | Status: DC | PRN
Start: 2023-03-22 — End: 2023-03-22
  Administered 2023-03-22: 8 mg via INTRAVENOUS

## 2023-03-22 MED ORDER — HYDROMORPHONE HCL 1 MG/ML IJ SOLN
INTRAMUSCULAR | Status: AC
  Filled 2023-03-22: qty 1

## 2023-03-22 SURGICAL SUPPLY — 35 items
BAG COUNTER SPONGE SURGICOUNT (BAG) ×2 IMPLANT
BAG SPNG CNTER NS LX DISP (BAG) ×1
BIT DRILL 4.9 CANNULATED (BIT) ×1
BIT DRILL CANN QC 4.9 LRG (BIT) IMPLANT
BNDG COHESIVE 4X5 TAN STRL (GAUZE/BANDAGES/DRESSINGS) ×2 IMPLANT
BNDG GAUZE DERMACEA FLUFF 4 (GAUZE/BANDAGES/DRESSINGS) ×2 IMPLANT
BNDG GZE DERMACEA 4 6PLY (GAUZE/BANDAGES/DRESSINGS) ×1
COVER PERINEAL POST (MISCELLANEOUS) ×2 IMPLANT
COVER SURGICAL LIGHT HANDLE (MISCELLANEOUS) ×2 IMPLANT
DRAPE STERI IOBAN 125X83 (DRAPES) ×2 IMPLANT
DRESSING MEPILEX FLEX 4X4 (GAUZE/BANDAGES/DRESSINGS) ×2 IMPLANT
DRILL BIT CANNULATED 4.9 (BIT) ×1
DRSG MEPILEX FLEX 4X4 (GAUZE/BANDAGES/DRESSINGS) ×1
DURAPREP 26ML APPLICATOR (WOUND CARE) ×2 IMPLANT
ELECT REM PT RETURN 15FT ADLT (MISCELLANEOUS) ×2 IMPLANT
GLOVE BIO SURGEON STRL SZ7.5 (GLOVE) ×4 IMPLANT
GLOVE BIOGEL PI IND STRL 7.5 (GLOVE) ×2 IMPLANT
GLOVE BIOGEL PI IND STRL 8 (GLOVE) ×2 IMPLANT
GOWN STRL REUS W/ TWL LRG LVL3 (GOWN DISPOSABLE) ×6 IMPLANT
GOWN STRL REUS W/TWL LRG LVL3 (GOWN DISPOSABLE) ×3
GUIDEWIRE ASNIS 3.2 NONCAL (WIRE) IMPLANT
KIT BASIN OR (CUSTOM PROCEDURE TRAY) ×2 IMPLANT
KIT TURNOVER KIT A (KITS) ×2 IMPLANT
MANIFOLD NEPTUNE II (INSTRUMENTS) ×2 IMPLANT
NS IRRIG 1000ML POUR BTL (IV SOLUTION) ×2 IMPLANT
PACK GENERAL/GYN (CUSTOM PROCEDURE TRAY) ×2 IMPLANT
PAD ARMBOARD 7.5X6 YLW CONV (MISCELLANEOUS) ×4 IMPLANT
SCREW CANN ASNIS 6.5X105 STRL (Screw) IMPLANT
SCREW CANN ASNIS 6.5X95 (Screw) IMPLANT
SCREW CANNULATED ASNIS 6.5X100 (Screw) IMPLANT
STRIP CLOSURE SKIN 1/2X4 (GAUZE/BANDAGES/DRESSINGS) IMPLANT
SUT MON AB 2-0 CT1 36 (SUTURE) ×2 IMPLANT
SUT VIC AB 0 CT1 27 (SUTURE)
SUT VIC AB 0 CT1 27XBRD ANBCTR (SUTURE) IMPLANT
WATER STERILE IRR 1000ML POUR (IV SOLUTION) ×2 IMPLANT

## 2023-03-22 NOTE — Anesthesia Preprocedure Evaluation (Addendum)
Anesthesia Evaluation  Patient identified by MRN, date of birth, ID band Patient awake    Reviewed: Allergy & Precautions, NPO status , Patient's Chart, lab work & pertinent test results  History of Anesthesia Complications (+) history of anesthetic complications  Airway Mallampati: III  TM Distance: >3 FB     Dental  (+) Chipped, Poor Dentition, Missing,    Pulmonary neg pulmonary ROS   breath sounds clear to auscultation       Cardiovascular hypertension, Pt. on medications (-) angina (-) Past MI  Rhythm:Regular  1. Left ventricular ejection fraction, by visual estimation, is 60 to  65%. The left ventricle has normal function. Normal left ventricular size.  Left ventricular septal wall thickness was mildly increased. Mildly  increased left ventricular posterior  wall thickness. There is mildly increased left ventricular hypertrophy.  2. Elevated left ventricular end-diastolic pressure.  3. Left ventricular diastolic Doppler parameters are consistent with  pseudonormalization pattern of LV diastolic filling.  4. Global right ventricle has normal systolic function.The right  ventricular size is normal. No increase in right ventricular wall  thickness.  5. Left atrial size was moderately dilated.  6. Right atrial size was normal.  7. The mitral valve is normal in structure. Mild mitral valve  regurgitation. No evidence of mitral stenosis.  8. The tricuspid valve is normal in structure. Tricuspid valve  regurgitation is mild.  9. The aortic valve is normal in structure. Aortic valve regurgitation  was not visualized by color flow Doppler. Structurally normal aortic  valve, with no evidence of sclerosis or stenosis.  10. Aortic valve leaflets mildly thickened and calcified.  11. The pulmonic valve was normal in structure. Pulmonic valve  regurgitation is trivial by color flow Doppler.  12. There is mild dilatation of  the ascending aorta measuring 38 mm.  13. The inferior vena cava is normal in size with greater than 50%  respiratory variability, suggesting right atrial pressure of 3 mmHg.    Neuro/Psych  PSYCHIATRIC DISORDERS  Depression   Dementia    GI/Hepatic negative GI ROS, Neg liver ROS,,,  Endo/Other  diabetes    Renal/GU CRFRenal diseaseLab Results      Component                Value               Date                      CREATININE               1.29 (H)            04/20/2022           Lab Results      Component                Value               Date                      K                        4.3                 04/20/2022                Musculoskeletal   Abdominal   Peds  Hematology  (+) Blood dyscrasia, anemia Lab Results  Component                Value               Date                      WBC                      8.0                 04/20/2022                HGB                      10.5 (L)            04/20/2022                HCT                      32.5 (L)            04/20/2022                MCV                      91.5                04/20/2022                PLT                      293                 04/20/2022            No results found for: "PTT" Lab Results      Component                Value               Date                      INR                      1.3 (H)             08/26/2021                INR                      1.1                 08/25/2021              Anesthesia Other Findings   Reproductive/Obstetrics                             Anesthesia Physical Anesthesia Plan  ASA: 3  Anesthesia Plan: General   Post-op Pain Management: Minimal or no pain anticipated, Tylenol PO (pre-op)* and Celebrex PO (pre-op)*   Induction: Intravenous  PONV Risk Score and Plan: 1 and Treatment may vary due to age or medical condition, Ondansetron and Dexamethasone  Airway Management Planned: Oral ETT and  LMA  Additional Equipment: None  Intra-op Plan:   Post-operative Plan: Extubation in OR  Informed Consent: I have reviewed  the patients History and Physical, chart, labs and discussed the procedure including the risks, benefits and alternatives for the proposed anesthesia with the patient or authorized representative who has indicated his/her understanding and acceptance.     Dental advisory given  Plan Discussed with: CRNA and Anesthesiologist  Anesthesia Plan Comments:         Anesthesia Quick Evaluation

## 2023-03-22 NOTE — Consult Note (Signed)
ORTHOPAEDIC CONSULTATION  REQUESTING PHYSICIAN: Azucena Fallen, MD  Time called na Time arrived na  Chief Complaint: L hip fx  HPI: Bryan Farrell is a 72 y.o. male who complains of L hip pain.   I have reviewed and agree with below history:   Bryan Farrell is a 72 y.o. male with PMH significant for diabetes mellitus, vascular dementia, depression, adjustment disorder, wheelchair-bound lives at [facility] health assisted living facility presented in the hospital from Dr. Greig Right office as direct admit for left hip fracture.  Patient states while getting out of bed to the wheelchair , he slipped from the bed and fell on his left hip and he has developed pain.  X-ray was done at the facility and appointment was made with Dr. Eulah Pont in the office.  Patient reports having pain while movement in the bed , otherwise denies any pain. He went to see Dr. Eulah Pont in the office today,  found to have left femoral neck fracture and was sent as a direct admit for possible ORIF tomorrow.   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   Hypertension    Past Surgical History:  Procedure Laterality Date   AMPUTATION Left 05/30/2019   Procedure: LEFT FOOT 2ND RAY AMPUTATION;  Surgeon: Nadara Mustard, MD;  Location: [facility] OR;  Service: Orthopedics;  Laterality: Left;   AMPUTATION Right 12/28/2021   Procedure: RIGHT 2ND TOE AMPUTATION;  Surgeon: Nadara Mustard, MD;  Location: [facility] OR;  Service: Orthopedics;  Laterality: Right;   TOE AMPUTATION Right    TONSILLECTOMY     TOTAL HIP ARTHROPLASTY Right 04/20/2022   Procedure: TOTAL HIP ARTHROPLASTY ANTERIOR APPROACH;  Surgeon: Samson Frederic, MD;  Location: [facility] ORS;  Service: Orthopedics;  Laterality: Right;   Social History   Socioeconomic History   Marital status: Single    Spouse name: Not on file   Number of children: Not on file   Years of  education: Not on file   Highest education level: Not on file  Occupational History   Not on file  Tobacco Use   Smoking status: Never   Smokeless tobacco: Never  Vaping Use   Vaping status: Never Used  Substance and Sexual Activity   Alcohol use: Not Currently   Drug use: No   Sexual activity: Not Currently  Other Topics Concern   Not on file  Social History Narrative   Not on file   Social Determinants of Health   Financial Resource Strain: Not on file  Food Insecurity: No Food Insecurity (03/21/2023)   Hunger Vital Sign    Worried About Running Out of Food in the Last Year: Never true    Ran Out of Food in the Last Year: Never true  Transportation Needs: No Transportation Needs (03/21/2023)   PRAPARE - Administrator, Civil Service (Medical): No    Lack of Transportation (Non-Medical): No  Physical Activity: Not on file  Stress: Not on file  Social Connections: Not on file   Family History  Problem Relation Age of Onset   Heart Problems Mother    Heart Problems Father    No Known Allergies Prior to Admission medications   Medication Sig Start Date End Date Taking? Authorizing Provider  acetaminophen (TYLENOL) 500 MG tablet Take 500 mg by mouth every 6 (six) hours as needed (Chronic pain).  [provider]  atorvastatin (LIPITOR) 40 MG tablet Take 40 mg by mouth at bedtime. 03/26/20   [provider]  carboxymethylcellulose 1 % ophthalmic solution Place 1 drop into both eyes 3 (three) times daily.    [provider]  chlorhexidine (PERIDEX) 0.12 % solution 15 mLs by Mouth Rinse route in the morning.    [provider]  cholecalciferol (VITAMIN D) 25 MCG (1000 UNIT) tablet Take 1,000 Units by mouth daily. 02/12/20   [provider]  divalproex (DEPAKOTE) 125 MG DR tablet Take 125 mg by mouth 3 (three) times daily. 08/21/21   [provider]  feeding supplement, GLUCERNA SHAKE, (GLUCERNA SHAKE) LIQD Take 237 mLs  by mouth 3 (three) times daily after meals.    [provider]  ferrous sulfate 325 (65 FE) MG tablet Take 1 tablet (325 mg total) by mouth daily with breakfast. 04/25/22   Burnadette Pop, MD  gabapentin (NEURONTIN) 100 MG capsule Take 1 capsule (100 mg total) by mouth 3 (three) times daily. 04/16/19   Benjiman Core, MD  lidocaine 4 % Place 1 patch onto the skin daily.    [provider]  Multiple Vitamins-Minerals (MULTIVITAMIN WITH MINERALS) tablet Take 1 tablet by mouth daily.    [provider]  mupirocin ointment (BACTROBAN) 2 % Apply 1 Application topically daily. Apply to R lateral thigh topically. Clean abrasion with wound cleanser, apply and calcium alginate cover with bordered gauz 01/10/23   [provider]  polyethylene glycol powder (GLYCOLAX/MIRALAX) 17 GM/SCOOP powder Take 17 g by mouth in the morning and at bedtime.    [provider]  sennosides-docusate sodium (SENOKOT-S) 8.6-50 MG tablet Take 1-2 tablets by mouth See admin instructions. Take 1 tablet by mouth in the morning and 2 tablets at 5 PM daily    [provider]  sertraline (ZOLOFT) 100 MG tablet Take 100 mg by mouth See admin instructions. Take with 50 mg for a total of 150 mg in the morning 03/20/20   [provider]  sertraline (ZOLOFT) 50 MG tablet Take 50 mg by mouth See admin instructions. Take with 100 mg for a total of 150 mg in the morning 03/01/23   [provider]  TOUJEO SOLOSTAR 300 UNIT/ML Solostar Pen Inject 10 Units into the skin at bedtime. 03/10/23   [provider]  traMADol (ULTRAM) 50 MG tablet Take 50 mg by mouth 3 (three) times daily. 03/20/23   [provider]   DG HIP UNILAT WITH PELVIS 2-3 VIEWS LEFT  Result Date: 03/21/2023 CLINICAL DATA:  Closed left hip fracture. EXAM: DG HIP (WITH OR WITHOUT PELVIS) 2-3V LEFT COMPARISON:  Pelvis and right hip radiograph 04/27/2022 FINDINGS: Nondisplaced left femoral neck  fracture. Femoral head is seated in the acetabulum. Intact bony pelvis, no rami fracture. Right hip arthroplasty is intact were visualized. Pubic symphysis and sacroiliac joints are congruent. IMPRESSION: Nondisplaced left femoral neck fracture. Electronically Signed   By: Narda Rutherford M.D.   On: 03/21/2023 19:51    Positive ROS: All other systems have been reviewed and were otherwise negative with the exception of those mentioned in the HPI and as above.  Labs cbc Recent Labs    03/21/23 1626 03/22/23 0120  WBC 12.7* 9.2  HGB 9.8* 8.9*  HCT 31.0* 27.5*  PLT 225 224    Labs inflam No results for input(s): "CRP" in the last 72 hours.  Invalid input(s): "ESR"  Labs coag No results for input(s): "INR", "PTT" in  the last 72 hours.  Invalid input(s): "PT"  Recent Labs    03/21/23 1626 03/22/23 0120  NA 136 137  K 4.5 4.2  CL 102 104  CO2 25 26  GLUCOSE 170* 184*  BUN 24* 24*  CREATININE 1.37* 1.39*  CALCIUM 8.4* 8.1*    Physical Exam: Vitals:   03/21/23 2006 03/22/23 0500  BP: 119/83 120/79  Pulse: 76 73  Resp: 15 15  Temp: 98 F (36.7 C) 99.5 F (37.5 C)  SpO2: 90%    General: Alert, no acute distress Cardiovascular: No pedal edema Respiratory: No cyanosis, no use of accessory musculature GI: No organomegaly, abdomen is soft and non-tender Skin: No lesions in the area of chief complaint other than those listed below in MSK exam.  Neurologic: Sensation intact distally save for the below mentioned MSK exam Psychiatric: Patient is competent for consent with normal mood and affect Lymphatic: No axillary or cervical lymphadenopathy  MUSCULOSKELETAL:  LLE: pain with ROM at the hip Other extremities are atraumatic with painless ROM and NVI.  Assessment: L fem neck fracture  Plan: CRPP today   Sheral Apley, MD    03/22/2023 6:47 AM

## 2023-03-22 NOTE — Discharge Instructions (Signed)

## 2023-03-22 NOTE — H&P (View-Only) (Signed)
ORTHOPAEDIC CONSULTATION  REQUESTING PHYSICIAN: Azucena Fallen, MD  Time called na Time arrived na  Chief Complaint: L hip fx  HPI: Bryan Farrell is a 72 y.o. male who complains of L hip pain.   I have reviewed and agree with below history:   Bryan Farrell is a 72 y.o. male with PMH significant for diabetes mellitus, vascular dementia, depression, adjustment disorder, wheelchair-bound lives at [facility] health assisted living facility presented in the hospital from Dr. Greig Right office as direct admit for left hip fracture.  Patient states while getting out of bed to the wheelchair , he slipped from the bed and fell on his left hip and he has developed pain.  X-ray was done at the facility and appointment was made with Dr. Eulah Pont in the office.  Patient reports having pain while movement in the bed , otherwise denies any pain. He went to see Dr. Eulah Pont in the office today,  found to have left femoral neck fracture and was sent as a direct admit for possible ORIF tomorrow.   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   Hypertension    Past Surgical History:  Procedure Laterality Date   AMPUTATION Left 05/30/2019   Procedure: LEFT FOOT 2ND RAY AMPUTATION;  Surgeon: Nadara Mustard, MD;  Location: [facility] OR;  Service: Orthopedics;  Laterality: Left;   AMPUTATION Right 12/28/2021   Procedure: RIGHT 2ND TOE AMPUTATION;  Surgeon: Nadara Mustard, MD;  Location: [facility] OR;  Service: Orthopedics;  Laterality: Right;   TOE AMPUTATION Right    TONSILLECTOMY     TOTAL HIP ARTHROPLASTY Right 04/20/2022   Procedure: TOTAL HIP ARTHROPLASTY ANTERIOR APPROACH;  Surgeon: Samson Frederic, MD;  Location: [facility] ORS;  Service: Orthopedics;  Laterality: Right;   Social History   Socioeconomic History   Marital status: Single    Spouse name: Not on file   Number of children: Not on file   Years of  education: Not on file   Highest education level: Not on file  Occupational History   Not on file  Tobacco Use   Smoking status: Never   Smokeless tobacco: Never  Vaping Use   Vaping status: Never Used  Substance and Sexual Activity   Alcohol use: Not Currently   Drug use: No   Sexual activity: Not Currently  Other Topics Concern   Not on file  Social History Narrative   Not on file   Social Determinants of Health   Financial Resource Strain: Not on file  Food Insecurity: No Food Insecurity (03/21/2023)   Hunger Vital Sign    Worried About Running Out of Food in the Last Year: Never true    Ran Out of Food in the Last Year: Never true  Transportation Needs: No Transportation Needs (03/21/2023)   PRAPARE - Administrator, Civil Service (Medical): No    Lack of Transportation (Non-Medical): No  Physical Activity: Not on file  Stress: Not on file  Social Connections: Not on file   Family History  Problem Relation Age of Onset   Heart Problems Mother    Heart Problems Father    No Known Allergies Prior to Admission medications   Medication Sig Start Date End Date Taking? Authorizing Provider  acetaminophen (TYLENOL) 500 MG tablet Take 500 mg by mouth every 6 (six) hours as needed (Chronic pain).  [provider]  atorvastatin (LIPITOR) 40 MG tablet Take 40 mg by mouth at bedtime. 03/26/20   [provider]  carboxymethylcellulose 1 % ophthalmic solution Place 1 drop into both eyes 3 (three) times daily.    [provider]  chlorhexidine (PERIDEX) 0.12 % solution 15 mLs by Mouth Rinse route in the morning.    [provider]  cholecalciferol (VITAMIN D) 25 MCG (1000 UNIT) tablet Take 1,000 Units by mouth daily. 02/12/20   [provider]  divalproex (DEPAKOTE) 125 MG DR tablet Take 125 mg by mouth 3 (three) times daily. 08/21/21   [provider]  feeding supplement, GLUCERNA SHAKE, (GLUCERNA SHAKE) LIQD Take 237 mLs  by mouth 3 (three) times daily after meals.    [provider]  ferrous sulfate 325 (65 FE) MG tablet Take 1 tablet (325 mg total) by mouth daily with breakfast. 04/25/22   Burnadette Pop, MD  gabapentin (NEURONTIN) 100 MG capsule Take 1 capsule (100 mg total) by mouth 3 (three) times daily. 04/16/19   Benjiman Core, MD  lidocaine 4 % Place 1 patch onto the skin daily.    [provider]  Multiple Vitamins-Minerals (MULTIVITAMIN WITH MINERALS) tablet Take 1 tablet by mouth daily.    [provider]  mupirocin ointment (BACTROBAN) 2 % Apply 1 Application topically daily. Apply to R lateral thigh topically. Clean abrasion with wound cleanser, apply and calcium alginate cover with bordered gauz 01/10/23   [provider]  polyethylene glycol powder (GLYCOLAX/MIRALAX) 17 GM/SCOOP powder Take 17 g by mouth in the morning and at bedtime.    [provider]  sennosides-docusate sodium (SENOKOT-S) 8.6-50 MG tablet Take 1-2 tablets by mouth See admin instructions. Take 1 tablet by mouth in the morning and 2 tablets at 5 PM daily    [provider]  sertraline (ZOLOFT) 100 MG tablet Take 100 mg by mouth See admin instructions. Take with 50 mg for a total of 150 mg in the morning 03/20/20   [provider]  sertraline (ZOLOFT) 50 MG tablet Take 50 mg by mouth See admin instructions. Take with 100 mg for a total of 150 mg in the morning 03/01/23   [provider]  TOUJEO SOLOSTAR 300 UNIT/ML Solostar Pen Inject 10 Units into the skin at bedtime. 03/10/23   [provider]  traMADol (ULTRAM) 50 MG tablet Take 50 mg by mouth 3 (three) times daily. 03/20/23   [provider]   DG HIP UNILAT WITH PELVIS 2-3 VIEWS LEFT  Result Date: 03/21/2023 CLINICAL DATA:  Closed left hip fracture. EXAM: DG HIP (WITH OR WITHOUT PELVIS) 2-3V LEFT COMPARISON:  Pelvis and right hip radiograph 04/27/2022 FINDINGS: Nondisplaced left femoral neck  fracture. Femoral head is seated in the acetabulum. Intact bony pelvis, no rami fracture. Right hip arthroplasty is intact were visualized. Pubic symphysis and sacroiliac joints are congruent. IMPRESSION: Nondisplaced left femoral neck fracture. Electronically Signed   By: Narda Rutherford M.D.   On: 03/21/2023 19:51    Positive ROS: All other systems have been reviewed and were otherwise negative with the exception of those mentioned in the HPI and as above.  Labs cbc Recent Labs    03/21/23 1626 03/22/23 0120  WBC 12.7* 9.2  HGB 9.8* 8.9*  HCT 31.0* 27.5*  PLT 225 224    Labs inflam No results for input(s): "CRP" in the last 72 hours.  Invalid input(s): "ESR"  Labs coag No results for input(s): "INR", "PTT" in  the last 72 hours.  Invalid input(s): "PT"  Recent Labs    03/21/23 1626 03/22/23 0120  NA 136 137  K 4.5 4.2  CL 102 104  CO2 25 26  GLUCOSE 170* 184*  BUN 24* 24*  CREATININE 1.37* 1.39*  CALCIUM 8.4* 8.1*    Physical Exam: Vitals:   03/21/23 2006 03/22/23 0500  BP: 119/83 120/79  Pulse: 76 73  Resp: 15 15  Temp: 98 F (36.7 C) 99.5 F (37.5 C)  SpO2: 90%    General: Alert, no acute distress Cardiovascular: No pedal edema Respiratory: No cyanosis, no use of accessory musculature GI: No organomegaly, abdomen is soft and non-tender Skin: No lesions in the area of chief complaint other than those listed below in MSK exam.  Neurologic: Sensation intact distally save for the below mentioned MSK exam Psychiatric: Patient is competent for consent with normal mood and affect Lymphatic: No axillary or cervical lymphadenopathy  MUSCULOSKELETAL:  LLE: pain with ROM at the hip Other extremities are atraumatic with painless ROM and NVI.  Assessment: L fem neck fracture  Plan: CRPP today   Sheral Apley, MD    03/22/2023 6:47 AM

## 2023-03-22 NOTE — Anesthesia Procedure Notes (Signed)
Procedure Name: LMA Insertion Date/Time: 03/22/2023 2:28 PM  Performed by: Earlene Plater, CRNAPre-anesthesia Checklist: Patient identified, Emergency Drugs available, Suction available, Patient being monitored and Timeout performed Patient Re-evaluated:Patient Re-evaluated prior to induction Oxygen Delivery Method: Circle system utilized Preoxygenation: Pre-oxygenation with 100% oxygen Induction Type: IV induction Ventilation: Mask ventilation without difficulty and Oral airway inserted - appropriate to patient size LMA: LMA inserted LMA Size: 5.0 Number of attempts: 1 Placement Confirmation: ETT inserted through vocal cords under direct vision, positive ETCO2, CO2 detector and breath sounds checked- equal and bilateral Tube secured with: Tape Dental Injury: Teeth and Oropharynx as per pre-operative assessment

## 2023-03-22 NOTE — Transfer of Care (Signed)
Immediate Anesthesia Transfer of Care Note  Patient: Bryan Farrell  Procedure(s) Performed: PERCUTANEOUS FIXATION OF FEMORAL NECK (Left: Hip)  Patient Location: PACU  Anesthesia Type:General  Level of Consciousness: awake, alert , oriented, patient cooperative, and responds to stimulation  Airway & Oxygen Therapy: Patient Spontanous Breathing and Patient connected to face mask oxygen  Post-op Assessment: Report given to RN and Post -op Vital signs reviewed and stable  Post vital signs: Reviewed and stable  Last Vitals:  Vitals Value Taken Time  BP 129/77 03/22/23 1522  Temp 36.6 C 03/22/23 1522  Pulse 79 03/22/23 1524  Resp 11 03/22/23 1524  SpO2 96 % 03/22/23 1524  Vitals shown include unfiled device data.  Last Pain:  Vitals:   03/22/23 1522  TempSrc: Temporal  PainSc:        Patients Stated Pain Goal: 2 (03/22/23 1254)  Complications: No notable events documented.  *Report given to PACU RN for continued care

## 2023-03-22 NOTE — Progress Notes (Signed)
PROGRESS NOTE    Bryan Farrell  UUV:[id number] DOB: [birth year] DOA: 03/21/2023 PCP: Eloisa Northern, MD   Brief Narrative:  Bryan Farrell is a 72 y.o. male with PMH significant for diabetes mellitus, vascular dementia, depression, adjustment disorder, wheelchair-bound lives at [facility] health assisted living facility presented in the hospital from Dr. Greig Right office as direct admit for left hip fracture. Hospitalist called for admission, plan for ORIF per [facility] schedule  Assessment & Plan:   Principal Problem:   Closed left hip fracture (HCC) Active Problems:   Adjustment disorder with depressed mood   Type 2 diabetes mellitus with peripheral neuropathy (HCC)   Depression   Dyslipidemia   Vascular dementia (HCC)  Left femoral Neck fracture secondary to mechanical fall, POA: X-ray showed left femoral neck fracture per conversation from ortho.  Orthopedics plans to schedule patient for ORIF once OR becomes available. N.p.o. midnight per Ortho order/set.   Adjustment disorder with depressed mood: Continue Zoloft 150 mg daily.   Type 2 diabetes: Hold p.o. diabetic medications. Continue Semglee 10 units daily Continue regular insulin sliding scale.   Obtain hemoglobin A1c   Hyperlipidemia: Continue Lipitor   Vascular dementia: No behavioral problems. Appears at baseline mental status   Peripheral neuropathy Continue gabapentin.  DVT prophylaxis: enoxaparin (LOVENOX) injection 30 mg Start: 03/21/23 1700 Code Status:   Code Status: Full Code Family Communication: None present  Status is: Inpt  Dispo: The patient is from: Home              Anticipated d/c is to: Home              Anticipated d/c date is: 48-72h pending clinical/surgical course              Patient currently NOT medically stable for discharge  Consultants:  Ortho  Procedures:  Pending above (ORIF planned)  Antimicrobials:  Perioperatively   Subjective: No acute issues/events  overnight  Objective: Vitals:   03/21/23 1524 03/21/23 1800 03/21/23 2006 03/22/23 0500  BP: 138/69  119/83 120/79  Pulse: 74  76 73  Resp: 17  15 15   Temp: 98.1 F (36.7 C)  98 F (36.7 C) 99.5 F (37.5 C)  TempSrc: Oral  Oral Oral  SpO2: 94%  90%   Weight:  88.5 kg    Height:  6' (1.829 m)      Intake/Output Summary (Last 24 hours) at 03/22/2023 0743 Last data filed at 03/22/2023 0500 Gross per 24 hour  Intake 699.16 ml  Output 550 ml  Net 149.16 ml   Filed Weights   03/21/23 1800  Weight: 88.5 kg    Examination:  General exam: Appears calm and comfortable  Respiratory system: Clear to auscultation. Respiratory effort normal. Cardiovascular system: S1 & S2 heard, RRR. No JVD, murmurs, rubs, gallops or clicks. No pedal edema. Gastrointestinal system: Abdomen is nondistended, soft and nontender. No organomegaly or masses felt. Normal bowel sounds heard. Central nervous system: Alert and oriented to person/place only. No focal neurological deficits. Extremities: LLE rom/strength testing limited by pain. Skin: No rashes, lesions or ulcers   Data Reviewed: I have personally reviewed following labs and imaging studies  CBC: Recent Labs  Lab 03/21/23 1626 03/22/23 0120  WBC 12.7* 9.2  HGB 9.8* 8.9*  HCT 31.0* 27.5*  MCV 93.9 92.9  PLT 225 224   Basic Metabolic Panel: Recent Labs  Lab 03/21/23 1626 03/22/23 0120  NA 136 137  K 4.5 4.2  CL 102 104  CO2  25 26  GLUCOSE 170* 184*  BUN 24* 24*  CREATININE 1.37* 1.39*  CALCIUM 8.4* 8.1*  MG 1.8 1.8  PHOS 4.1 3.3   GFR: Estimated Creatinine Clearance: 55.1 mL/min (A) (by C-G formula based on SCr of 1.39 mg/dL (H)). Liver Function Tests: No results for input(s): "AST", "ALT", "ALKPHOS", "BILITOT", "PROT", "ALBUMIN" in the last 168 hours. No results for input(s): "LIPASE", "AMYLASE" in the last 168 hours. No results for input(s): "AMMONIA" in the last 168 hours. Coagulation Profile: No results for input(s):  "INR", "PROTIME" in the last 168 hours. Cardiac Enzymes: No results for input(s): "CKTOTAL", "CKMB", "CKMBINDEX", "TROPONINI" in the last 168 hours. BNP (last 3 results) No results for input(s): "PROBNP" in the last 8760 hours. HbA1C: Recent Labs    03/21/23 1626  HGBA1C 7.5*   CBG: Recent Labs  Lab 03/21/23 1536 03/21/23 2153  GLUCAP 159* 190*   Lipid Profile: No results for input(s): "CHOL", "HDL", "LDLCALC", "TRIG", "CHOLHDL", "LDLDIRECT" in the last 72 hours. Thyroid Function Tests: No results for input(s): "TSH", "T4TOTAL", "FREET4", "T3FREE", "THYROIDAB" in the last 72 hours. Anemia Panel: No results for input(s): "VITAMINB12", "FOLATE", "FERRITIN", "TIBC", "IRON", "RETICCTPCT" in the last 72 hours. Sepsis Labs: No results for input(s): "PROCALCITON", "LATICACIDVEN" in the last 168 hours.  No results found for this or any previous visit (from the past 240 hour(s)).       Radiology Studies: DG HIP UNILAT WITH PELVIS 2-3 VIEWS LEFT  Result Date: 03/21/2023 CLINICAL DATA:  Closed left hip fracture. EXAM: DG HIP (WITH OR WITHOUT PELVIS) 2-3V LEFT COMPARISON:  Pelvis and right hip radiograph 04/27/2022 FINDINGS: Nondisplaced left femoral neck fracture. Femoral head is seated in the acetabulum. Intact bony pelvis, no rami fracture. Right hip arthroplasty is intact were visualized. Pubic symphysis and sacroiliac joints are congruent. IMPRESSION: Nondisplaced left femoral neck fracture. Electronically Signed   By: Narda Rutherford M.D.   On: 03/21/2023 19:51        Scheduled Meds:  atorvastatin  40 mg Oral QHS   divalproex  125 mg Oral TID   docusate sodium  100 mg Oral BID   enoxaparin (LOVENOX) injection  30 mg Subcutaneous Q24H   ferrous sulfate  325 mg Oral Q breakfast   gabapentin  100 mg Oral TID   insulin aspart  0-6 Units Subcutaneous TID WC   insulin glargine-yfgn  10 Units Subcutaneous QHS   traMADol  50 mg Oral TID   Continuous Infusions:  sodium chloride  50 mL/hr at 03/21/23 1848     LOS: 1 day   Time spent:  Azucena Fallen, DO [facility]  If 7PM-7AM, please contact night-coverage [email or web address]  03/22/2023, 7:43 AM

## 2023-03-22 NOTE — Interval H&P Note (Signed)
History and Physical Interval Note:  03/22/2023 1:46 PM  Bryan Farrell  has presented today for surgery, with the diagnosis of LEFT HIP FRACTURE.  The various methods of treatment have been discussed with the patient and family. After consideration of risks, benefits and other options for treatment, the patient has consented to  Procedure(s): PERCUTANEOUS FIXATION OF FEMORAL NECK (Left) as a surgical intervention.  The patient's history has been reviewed, patient examined, no change in status, stable for surgery.  I have reviewed the patient's chart and labs.  Questions were answered to the patient's satisfaction.     Sheral Apley

## 2023-03-23 DIAGNOSIS — S72002D Fracture of unspecified part of neck of left femur, subsequent encounter for closed fracture with routine healing: Secondary | ICD-10-CM | POA: Diagnosis not present

## 2023-03-23 DIAGNOSIS — F4321 Adjustment disorder with depressed mood: Secondary | ICD-10-CM | POA: Diagnosis not present

## 2023-03-23 DIAGNOSIS — E785 Hyperlipidemia, unspecified: Secondary | ICD-10-CM | POA: Diagnosis not present

## 2023-03-23 DIAGNOSIS — F01C18 Vascular dementia, severe, with other behavioral disturbance: Secondary | ICD-10-CM | POA: Diagnosis not present

## 2023-03-23 LAB — GLUCOSE, CAPILLARY
Glucose-Capillary: 111 mg/dL — ABNORMAL HIGH (ref 70–99)
Glucose-Capillary: 115 mg/dL — ABNORMAL HIGH (ref 70–99)
Glucose-Capillary: 185 mg/dL — ABNORMAL HIGH (ref 70–99)
Glucose-Capillary: 176 mg/dL — ABNORMAL HIGH (ref 70–99)

## 2023-03-23 MED ORDER — ENOXAPARIN SODIUM 40 MG/0.4ML IJ SOSY
40.0000 mg | PREFILLED_SYRINGE | INTRAMUSCULAR | Status: DC
  Administered 2023-03-23 – 2023-03-26 (×4): 40 mg via SUBCUTANEOUS
  Filled 2023-03-23 (×4): qty 0.4

## 2023-03-23 NOTE — Evaluation (Signed)
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   Hypertension    Past Surgical History:  Past Surgical History:  Procedure Laterality Date   AMPUTATION Left 05/30/2019   Procedure: LEFT FOOT 2ND RAY AMPUTATION;  Surgeon: Nadara Mustard, MD;  Location: [facility] OR;  Service: Orthopedics;  Laterality: Left;   AMPUTATION Right 12/28/2021   Procedure: RIGHT 2ND TOE AMPUTATION;  Surgeon: Nadara Mustard, MD;  Location: [facility] OR;  Service: Orthopedics;  Laterality: Right;   TOE AMPUTATION Right    TONSILLECTOMY     TOTAL HIP ARTHROPLASTY Right 04/20/2022   Procedure: TOTAL HIP ARTHROPLASTY ANTERIOR APPROACH;  Surgeon: Samson Frederic, MD;  Location: [facility] ORS;  Service: Orthopedics;  Laterality: Right;   HPI:  Oshen Serratore is a 72 y.o. male who lives at [facility] health assisted living facility presented in the hospital from Dr. Greig Right office as direct admit for left hip fracture. No chest imaging. Pt with PMH significant for diabetes mellitus, vascular dementia, depression, adjustment disorder, wheelchair-bound    Assessment / Plan / Recommendation  Clinical Impression  Pt presents with grossly functional swallowing as assessed clinically.  Pt tolerated all consistencies trialed including serial straw sips of thin liquid with no clinical s/s of aspiraiton.  He exhibited good oral clearance of solids.  He c/o difficulty chewing 2/2 partial edentulism.  Discussed diet preference with pt who would prefer softer foods, but could advance to regular diet safely if desired.    Recommend mechanical soft solids with thin liquid.  SLP  Visit Diagnosis: Dysphagia, oral phase (R13.11)    Aspiration Risk  Mild aspiration risk    Diet Recommendation Dysphagia 3 (Mech soft);Thin liquid    Liquid Administration via: Cup;Straw Medication Administration: Whole meds with liquid Supervision: Patient able to self feed Compensations: Slow rate;Small sips/bites Postural Changes: Seated upright at 90 degrees    Other  Recommendations Oral Care Recommendations: Oral care BID    Recommendations for follow up therapy are one component of a multi-disciplinary discharge planning process, led by the attending physician.  Recommendations may be updated based on patient status, additional functional criteria and insurance authorization.  Follow up Recommendations No SLP follow up      Assistance Recommended at Discharge  N/A  Functional Status Assessment Patient has not had a recent decline in their functional status  Frequency and Duration  (N/A)          Prognosis Prognosis for improved oropharyngeal function:  (N/A)      Swallow Study   General Date of Onset: 03/21/23 HPI: Hilario Cornforth is a 72 y.o. male who lives at [facility] health assisted living facility presented in the hospital from Dr. Greig Right office as direct admit for left hip fracture. No chest imaging. Pt with PMH significant for diabetes mellitus, vascular dementia, depression, adjustment disorder, wheelchair-bound Type of Study: Bedside Swallow Evaluation Previous Swallow Assessment: Jan 2023 clinical dysphagia managment Diet Prior to this Study: Regular;Thin liquids (Level 0) Temperature Spikes Noted: No Respiratory Status: Nasal cannula History of Recent Intubation: Yes Total duration of intubation (days):  (for procedure) Behavior/Cognition: Alert;Cooperative;Pleasant mood Oral Cavity Assessment: Within Functional  Limits Oral Care Completed by SLP: No Oral Cavity - Dentition: Missing dentition Vision: Functional for self-feeding Self-Feeding  Abilities: Able to feed self Patient Positioning: Upright in bed Baseline Vocal Quality: Normal Volitional Cough: Strong Volitional Swallow: Able to elicit    Oral/Motor/Sensory Function Overall Oral Motor/Sensory Function: Within functional limits Facial ROM: Within Functional Limits Facial Symmetry: Within Functional Limits Lingual ROM: Within Functional Limits Lingual Symmetry: Within Functional Limits Lingual Strength: Within Functional Limits Velum: Within Functional Limits Mandible: Within Functional Limits   Ice Chips Ice chips: Not tested   Thin Liquid Thin Liquid: Within functional limits Presentation: Straw    Nectar Thick Nectar Thick Liquid: Not tested   Honey Thick Honey Thick Liquid: Not tested   Puree Puree: Within functional limits Presentation: Spoon   Solid     Solid: Within functional limits Presentation: Self Fed      Kerrie Pleasure, MA, CCC-SLP Acute Rehabilitation Services Office: [phone] 03/23/2023,9:43 AM

## 2023-03-23 NOTE — Progress Notes (Signed)
Subjective: Patient has dementia at baseline so not the best historian. Gets confused about some things like where he lives.   Reports pain as moderate.  Tolerating diet.  Urinating.   No CP, SOB.  Hasn't mobilized OOB yet but PT/OT about to begin working with him now.  Objective:   VITALS:   Vitals:   03/22/23 1616 03/22/23 2033 03/23/23 0100 03/23/23 0727  BP: 127/83 128/81 (!) 138/91 131/78  Pulse: 78 91 83 80  Resp: 15 18 18 17   Temp: 98.2 F (36.8 C) 98.5 F (36.9 C) 99.3 F (37.4 C) 98.6 F (37 C)  TempSrc: Oral Oral Oral Oral  SpO2: 96% 93% 95% 91%  Weight:      Height:          Latest Ref Rng & Units 03/23/2023    8:38 AM 03/22/2023    1:20 AM 03/21/2023    4:26 PM  CBC  WBC 4.0 - 10.5 K/uL 9.0  9.2  12.7   Hemoglobin 13.0 - 17.0 g/dL 8.5  8.9  9.8   Hematocrit 39.0 - 52.0 % 25.7  27.5  31.0   Platelets 150 - 400 K/uL 213  224  225       Latest Ref Rng & Units 03/23/2023    8:38 AM 03/22/2023    1:20 AM 03/21/2023    4:26 PM  BMP  Glucose 70 - 99 mg/dL 270  623  762   BUN 8 - 23 mg/dL 20  24  24    Creatinine 0.61 - 1.24 mg/dL 8.31  5.17  6.16   Sodium 135 - 145 mmol/L 134  137  136   Potassium 3.5 - 5.1 mmol/L 4.6  4.2  4.5   Chloride 98 - 111 mmol/L 101  104  102   CO2 22 - 32 mmol/L 27  26  25    Calcium 8.9 - 10.3 mg/dL 8.2  8.1  8.4    Intake/Output      08/08 0701 08/09 0700 08/09 0701 08/10 0700   P.O.     I.V. (mL/kg) 538.4 (6)    IV Piggyback 400    Total Intake(mL/kg) 938.4 (10.5)    Urine (mL/kg/hr)  550 (0.9)   Blood 5    Total Output 5 550   Net +933.4 -550           Physical Exam: General: NAD.  Laying in bed, calm, comfortable Resp: No increased wob Cardio: regular rate and rhythm ABD soft Neurologically intact MSK Neurovascularly intact Sensation intact distally Intact pulses distally Dorsiflexion/Plantar flexion intact Incision: dressing C/D/I   Assessment: 1 Day Post-Op  S/P Procedure(s) (LRB): PERCUTANEOUS FIXATION  OF FEMORAL NECK (Left) by Dr. Jewel Baize. Eulah Pont on 03/22/23  Principal Problem:   Closed left hip fracture (HCC) Active Problems:   Vascular dementia (HCC)   Adjustment disorder with depressed mood   Type 2 diabetes mellitus with peripheral neuropathy (HCC)   Depression   Dyslipidemia   Plan:  Advance diet Up with therapy if able Incentive Spirometry Elevate and Apply ice  Weightbearing: WBAT LLE Insicional and dressing care: Dressings left intact until follow-up and Reinforce dressings as needed Orthopedic device(s): None Showering: Keep dressing dry VTE prophylaxis: Lovenox 40mg  qd  while inpatient, can switch to ASA 81mg  bid x 30 days upon discharge , SCDs, ambulation Pain control: PRN Follow - up plan: 2 weeks Contact information:  Margarita Rana MD, Levester Fresh PA-C  Dispo: Skilled Nursing Facility/Rehab- will likely  return to one he came from if they are able to do rehab as well there. Ok to d/c from ortho standpoint once medically ready.     Jenne Pane, PA-C Office [phone] 03/23/2023, 1:34 PM

## 2023-03-23 NOTE — Plan of Care (Signed)
  Problem: Clinical Measurements: Goal: Will remain free from infection Outcome: Progressing Goal: Respiratory complications will improve Outcome: Progressing   Problem: Nutrition: Goal: Adequate nutrition will be maintained Outcome: Progressing   

## 2023-03-23 NOTE — Op Note (Signed)
03/22/2023  7:02 AM  PATIENT:  Bryan Farrell    PRE-OPERATIVE DIAGNOSIS:  LEFT HIP FRACTURE  POST-OPERATIVE DIAGNOSIS:  Same  PROCEDURE:  PERCUTANEOUS FIXATION OF FEMORAL NECK  SURGEON:  Sheral Apley, MD  ASSISTANT: Levester Fresh, PA-C, he was present and scrubbed throughout the case, critical for completion in a timely fashion, and for retraction, instrumentation, and closure.   ANESTHESIA:   General  PREOPERATIVE INDICATIONS:  Bryan Farrell is a  72 y.o. male who fell and was found to have a diagnosis of LEFT HIP FRACTURE who elected for surgical management.    The risks benefits and alternatives were discussed with the patient preoperatively including but not limited to the risks of infection, bleeding, nerve injury, cardiopulmonary complications, blood clots, malunion, nonunion, avascular necrosis, the need for revision surgery, the potential for conversion to hemiarthroplasty, among others, and the patient was willing to proceed.  OPERATIVE IMPLANTS: 6.5 mm cannulated screws x3  OPERATIVE FINDINGS: appropirate fem neck fracture for the below treatment.   OPERATIVE PROCEDURE: The patient was brought to the operating room and placed in supine position. IV antibiotics were given. General anesthesia administered. Foley was also given. The patient was placed on the fracture table. The operative extremity was positioned, without any significant reduction maneuver and was prepped and draped in usual sterile fashion.  Time out was performed.  Small incisions were made distal to the greater trochanter, and 3 guidewires were introduced Into an inverted triangle configuration. The lengths were measured. The reduction was slightly valgus, and near-anatomic. I opened the cortex with a cannulated drill, and then placed the screws into position. Satisfactory fixation was achieved. I sequentially tightened the screws by hand.  I performed a live fluoroscopic exam and no screw penetrance  was noted. All threads crossed the fracture site.   The wounds were irrigated copiously, and repaired with Vicryl with Steri-Strips and sterile gauze. There no complications and the patient tolerated the procedure well.  The patient will be weightbearing as tolerated, VTE prophylaxis will be: chemical and mobilization

## 2023-03-23 NOTE — Progress Notes (Signed)
PROGRESS NOTE    Bryan Farrell  UJW:[id number] DOB: [birth year] DOA: 03/21/2023 PCP: Eloisa Northern, MD   Brief Narrative:  Bryan Farrell is a 72 y.o. male with PMH significant for diabetes mellitus, vascular dementia, depression, adjustment disorder, wheelchair-bound lives at [facility] presented in the hospital from Dr. Greig Right office as direct admit for left hip fracture. Hospitalist called for admission, plan for ORIF per [facility] schedule  Assessment & Plan:   Principal Problem:   Closed left hip fracture (HCC) Active Problems:   Adjustment disorder with depressed mood   Type 2 diabetes mellitus with peripheral neuropathy (HCC)   Depression   Dyslipidemia   Vascular dementia (HCC)  Left femoral Neck fracture secondary to mechanical fall, POA: X-ray showed left femoral neck fracture outpatient at ortho office Orthopedics following - Dr Eulah Pont - percutaneous fixation of femoral neck 8/9, tolerated well. PT/OT to follow -patient is reportedly wheelchair-bound at baseline   Adjustment disorder with depressed mood: Continue Zoloft 150 mg daily.   Type 2 diabetes: Hold p.o. diabetic medications. Continue Semglee 10 units daily Continue regular insulin sliding scale.   Obtain hemoglobin A1c   Hyperlipidemia: Continue Lipitor   Vascular dementia: No behavioral problems. Appears at baseline mental status   Peripheral neuropathy Continue gabapentin.  DVT prophylaxis: SCDs Start: 03/22/23 1613 enoxaparin (LOVENOX) injection 30 mg Start: 03/21/23 1700 Code Status:   Code Status: Full Code Family Communication: None present  Status is: Inpt  Dispo: The patient is from: Home              Anticipated d/c is to: Home              Anticipated d/c date is: 24-48h pending clinical/surgical course              Patient currently NOT medically stable for discharge  Consultants:  Ortho  Procedures:  Pending above (ORIF  planned)  Antimicrobials:  Perioperatively   Subjective: No acute issues/events overnight, pain well controlled  Objective: Vitals:   03/22/23 1616 03/22/23 2033 03/23/23 0100 03/23/23 0727  BP: 127/83 128/81 (!) 138/91 131/78  Pulse: 78 91 83 80  Resp: 15 18 18 17   Temp: 98.2 F (36.8 C) 98.5 F (36.9 C) 99.3 F (37.4 C) 98.6 F (37 C)  TempSrc: Oral Oral Oral Oral  SpO2: 96% 93% 95% 91%  Weight:      Height:        Intake/Output Summary (Last 24 hours) at 03/23/2023 0748 Last data filed at 03/23/2023 0400 Gross per 24 hour  Intake 938.37 ml  Output 5 ml  Net 933.37 ml   Filed Weights   03/21/23 1800 03/22/23 1238  Weight: 88.5 kg 89 kg    Examination:  General exam: Appears calm and comfortable  Respiratory system: Clear to auscultation. Respiratory effort normal. Cardiovascular system: S1 & S2 heard, RRR. No JVD, murmurs, rubs, gallops or clicks. No pedal edema. Gastrointestinal system: Abdomen is nondistended, soft and nontender. No organomegaly or masses felt. Normal bowel sounds heard. Central nervous system: Alert and oriented to person. No focal neurological deficits. Extremities: LLE rom/strength testing limited by pain; hip bandage clean/dry/intact Skin: No rashes, lesions or ulcers   Data Reviewed: I have personally reviewed following labs and imaging studies  CBC: Recent Labs  Lab 03/21/23 1626 03/22/23 0120  WBC 12.7* 9.2  HGB 9.8* 8.9*  HCT 31.0* 27.5*  MCV 93.9 92.9  PLT 225 224   Basic Metabolic Panel: Recent Labs  Lab 03/21/23 1626 03/22/23 0120  NA 136 137  K 4.5 4.2  CL 102 104  CO2 25 26  GLUCOSE 170* 184*  BUN 24* 24*  CREATININE 1.37* 1.39*  CALCIUM 8.4* 8.1*  MG 1.8 1.8  PHOS 4.1 3.3   GFR: Estimated Creatinine Clearance: 55.1 mL/min (A) (by C-G formula based on SCr of 1.39 mg/dL (H)).  HbA1C: Recent Labs    03/21/23 1626  HGBA1C 7.5*   CBG: Recent Labs  Lab 03/22/23 1129 03/22/23 1332 03/22/23 1535  03/22/23 2155 03/23/23 0721  GLUCAP 86 91 92 138* 111*   Radiology Studies: DG HIP UNILAT WITH PELVIS 2-3 VIEWS LEFT  Result Date: 03/22/2023 CLINICAL DATA:  Left hip pinning. EXAM: DG HIP (WITH OR WITHOUT PELVIS) 2-3V LEFT COMPARISON:  Pelvis and left hip radiographs 03/21/2023 FINDINGS: Images were performed intraoperatively without the presence of a radiologist. Interval placement of 3 cannulated screws fixating the previously seen proximal left femoral fracture. No hardware complication is seen. Total fluoroscopy images: 2 Total fluoroscopy time: 51 seconds Total dose: Radiation Exposure Index (as provided by the fluoroscopic device): 14.18 mGy air Kerma Please see intraoperative findings for further detail. IMPRESSION: Intraoperative fluoroscopic guidance for left hip pinning. Electronically Signed   By: Neita Garnet M.D.   On: 03/22/2023 16:14   Pelvis Portable  Result Date: 03/22/2023 CLINICAL DATA:  Hip fracture EXAM: PORTABLE PELVIS 1-2 VIEWS COMPARISON:  03/21/2023 FINDINGS: Three cannulated screws span the LEFT femoral neck. No complication. RIGHT total hip arthroplasty. IMPRESSION: yesLEFT femoral neck fixation. Electronically Signed   By: Genevive Bi M.D.   On: 03/22/2023 16:04   DG C-Arm 1-60 Min-No Report  Result Date: 03/22/2023 Fluoroscopy was utilized by the requesting physician.  No radiographic interpretation.   DG HIP UNILAT WITH PELVIS 2-3 VIEWS LEFT  Result Date: 03/21/2023 CLINICAL DATA:  Closed left hip fracture. EXAM: DG HIP (WITH OR WITHOUT PELVIS) 2-3V LEFT COMPARISON:  Pelvis and right hip radiograph 04/27/2022 FINDINGS: Nondisplaced left femoral neck fracture. Femoral head is seated in the acetabulum. Intact bony pelvis, no rami fracture. Right hip arthroplasty is intact were visualized. Pubic symphysis and sacroiliac joints are congruent. IMPRESSION: Nondisplaced left femoral neck fracture. Electronically Signed   By: Narda Rutherford M.D.   On: 03/21/2023 19:51         Scheduled Meds:  acetaminophen  500 mg Oral Q6H   atorvastatin  40 mg Oral QHS   divalproex  125 mg Oral TID   docusate sodium  100 mg Oral BID   enoxaparin (LOVENOX) injection  30 mg Subcutaneous Q24H   ferrous sulfate  325 mg Oral Q breakfast   gabapentin  100 mg Oral TID   insulin aspart  0-6 Units Subcutaneous TID WC   insulin glargine-yfgn  10 Units Subcutaneous QHS   traMADol  50 mg Oral TID   Continuous Infusions:  sodium chloride 50 mL/hr at 03/22/23 1945   methocarbamol (ROBAXIN) IV       LOS: 2 days   Time spent:  Azucena Fallen, DO [facility]  If 7PM-7AM, please contact night-coverage [email or web address]  03/23/2023, 7:48 AM

## 2023-03-23 NOTE — Evaluation (Signed)
Physical Therapy Evaluation Patient Details Name: Bryan Farrell MRN: [id number] DOB: 04-05-1951 Today's Date: 03/23/2023  History of Present Illness  The pt is a 72 yo male presenting 8/7 from SNF after a fall getting from bed to [facility]. Pt found to have L hip f/x and is s/p percutaneous fixation of femoral neck on 8/8. 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   Clinical Impression  Pt in bed upon arrival of PT, agreeable to evaluation at this time. Prior to admission the pt reports he was mobilizing with use of WC, crutches, or no DME, and that he was able to complete ADLs without assistance from staff. Pt continued to have conflicting answers through session and no family present to confirm. The pt required max-totalA of 2 to complete bed mobility and x2 sit-stand from EOB. Pt limited in use of LLE due to pain and needed assist to power up on RLE as well as to keep RLE in position. The pt was unable to maintain for more than 10 seconds and unable to progress stepping at this time. Recommend inpatient therapies <3hr/day to regain maximal strength and mobility.      If plan is discharge home, recommend the following: Two people to help with walking and/or transfers;Two people to help with bathing/dressing/bathroom;Assistance with cooking/housework;Direct supervision/assist for medications management;Direct supervision/assist for financial management;Assist for transportation;Help with stairs or ramp for entrance   Can travel by private vehicle   No    Equipment Recommendations Other (comment) (defer to post acute)  Recommendations for Other Services       Functional Status Assessment Patient has had a recent decline in their functional status and demonstrates the ability to make significant improvements in function in a reasonable and predictable amount of time.     Precautions / Restrictions Precautions Precautions: Fall Restrictions Weight  Bearing Restrictions: Yes LLE Weight Bearing: Weight bearing as tolerated      Mobility  Bed Mobility Overal bed mobility: Needs Assistance Bed Mobility: Supine to Sit, Sit to Supine     Supine to sit: Total assist, +2 for physical assistance Sit to supine: Total assist, +2 for physical assistance   General bed mobility comments: pt unable to assist with LE movements, dependent on helicopter transfer to reach EOB, mod-maxA to scoot at EOB    Transfers Overall transfer level: Needs assistance Equipment used: Rolling walker (2 wheels) Transfers: Sit to/from Stand Sit to Stand: Max assist, +2 physical assistance, From elevated surface           General transfer comment: max of 2 with blocking of R foot, assist and cues for posture and hip extension. completed x2    Ambulation/Gait               General Gait Details: pt unable  Stairs            Wheelchair Mobility     Tilt Bed    Modified Rankin (Stroke Patients Only)       Balance Overall balance assessment: Needs assistance Sitting-balance support: No upper extremity supported, Feet supported Sitting balance-Leahy Scale: Fair     Standing balance support: Bilateral upper extremity supported, During functional activity Standing balance-Leahy Scale: Zero Standing balance comment: dependent on BUE support and maxA of 2                             Pertinent Vitals/Pain Pain Assessment  Pain Assessment: Faces Faces Pain Scale: Hurts even more Pain Location: LLE with mobility and wt bearing Pain Descriptors / Indicators: Discomfort, Grimacing, Moaning Pain Intervention(s): Limited activity within patient's tolerance, Monitored during session, Repositioned    Home Living Family/patient expects to be discharged to:: Skilled nursing facility [facility])                   Additional Comments: pt is poor historian, has assist with medication mangaement.    Prior  Function Prior Level of Function : History of Falls (last six months);Patient poor historian/Family not available             Mobility Comments: He reports ambulating with crutches to transfer to w/c ADLs Comments: Pt reports being ind, poor historian     Extremity/Trunk Assessment   Upper Extremity Assessment Upper Extremity Assessment: Defer to OT evaluation    Lower Extremity Assessment Lower Extremity Assessment: Generalized weakness;LLE deficits/detail LLE Deficits / Details: limited by pain, able to complete partial ROM against gravity LLE: Unable to fully assess due to pain LLE Sensation: WNL LLE Coordination: WNL    Cervical / Trunk Assessment Cervical / Trunk Assessment: Kyphotic  Communication   Communication Communication: Difficulty communicating thoughts/reduced clarity of speech Cueing Techniques: Verbal cues  Cognition Arousal: Alert Behavior During Therapy: WFL for tasks assessed/performed Overall Cognitive Status: No family/caregiver present to determine baseline cognitive functioning                                 General Comments: pt giving inconsistent answers through session (at one point stating he mobilizes at home with crutches), able to follow simple cues and instructions. not formally assessed        General Comments General comments (skin integrity, edema, etc.): VSS on RA, pt soiled upon arrival and so NT present to change linens during stand    Exercises     Assessment/Plan    PT Assessment Patient needs continued PT services  PT Problem List Decreased strength;Decreased range of motion;Decreased activity tolerance;Decreased balance;Decreased mobility;Decreased safety awareness;Pain       PT Treatment Interventions DME instruction;Gait training;Therapeutic activities;Functional mobility training;Therapeutic exercise;Neuromuscular re-education;Balance training;Patient/family education    PT Goals (Current goals can be  found in the Care Plan section)  Acute Rehab PT Goals Patient Stated Goal: to reduce pain PT Goal Formulation: With patient Time For Goal Achievement: 04/06/23 Potential to Achieve Goals: Fair    Frequency Min 1X/week     Co-evaluation PT/OT/SLP Co-Evaluation/Treatment: Yes Reason for Co-Treatment: Necessary to address cognition/behavior during functional activity;For patient/therapist safety;To address functional/ADL transfers PT goals addressed during session: Mobility/safety with mobility;Balance;Strengthening/ROM;Proper use of DME         AM-PAC PT "6 Clicks" Mobility  Outcome Measure Help needed turning from your back to your side while in a flat bed without using bedrails?: Total Help needed moving from lying on your back to sitting on the side of a flat bed without using bedrails?: Total Help needed moving to and from a bed to a chair (including a wheelchair)?: Total Help needed standing up from a chair using your arms (e.g., wheelchair or bedside chair)?: Total Help needed to walk in hospital room?: Total Help needed climbing 3-5 steps with a railing? : Total 6 Click Score: 6    End of Session Equipment Utilized During Treatment: Gait belt Activity Tolerance: Patient tolerated treatment well Patient left: in bed;with call bell/phone within  reach;with bed alarm set Nurse Communication: Mobility status PT Visit Diagnosis: Other abnormalities of gait and mobility (R26.89);Muscle weakness (generalized) (M62.81);Pain Pain - Right/Left: Left Pain - part of body: Leg;Hip    Time: 1352-1430 PT Time Calculation (min) (ACUTE ONLY): 38 min   Charges:   PT Evaluation $PT Eval Low Complexity: 1 Low PT Treatments $Therapeutic Activity: 8-22 mins PT General Charges $$ ACUTE PT VISIT: 1 Visit         Vickki Muff, PT, DPT   Acute Rehabilitation Department Office [phone] Secure Chat Communication Preferred  Ronnie Derby 03/23/2023, 4:20 PM

## 2023-03-23 NOTE — Anesthesia Postprocedure Evaluation (Signed)
Anesthesia Post Note  Patient: [name]  Procedure(s) Performed: PERCUTANEOUS FIXATION OF FEMORAL NECK (Left: Hip)     Patient location during evaluation: PACU Anesthesia Type: General Level of consciousness: awake and alert Pain management: pain level controlled Vital Signs Assessment: post-procedure vital signs reviewed and stable Respiratory status: spontaneous breathing, nonlabored ventilation and respiratory function stable Cardiovascular status: blood pressure returned to baseline and stable Postop Assessment: no apparent nausea or vomiting Anesthetic complications: no   No notable events documented.  Last Vitals:  Vitals:   03/22/23 2033 03/23/23 0100  BP: 128/81 (!) 138/91  Pulse: 91 83  Resp: 18 18  Temp: 36.9 C 37.4 C  SpO2: 93% 95%    Last Pain:  Vitals:   03/23/23 0100  TempSrc: Oral  PainSc:    Pain Goal: Patients Stated Pain Goal: 2 (03/22/23 1254)                 Lowella Curb

## 2023-03-23 NOTE — Evaluation (Signed)
Occupational Therapy Evaluation Patient Details Name: Bryan Farrell MRN: [id number] DOB: [birth year] Today's Date: 03/23/2023   History of Present Illness The pt is a 72 yo male presenting 8/7 from SNF after a fall getting from bed to [facility]. Pt found to have L hip f/x and is s/p percutaneous fixation of femoral neck on 8/8. 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   Clinical Impression   Pt admitted for above dx, Pt is a poor historian so PLOF could be inaccurate but pt reports using crutches (inconsistent with what he told the PA). Per notes pt is w/c bound, he currently needs  Max A-Total A +2 for bed mobility and STS. Pt with decreased ability to move LLE, unable to maintain standing position for long despite OT/PT assist with balance and cues to reposition feet and hips to gather balance. Pt would benefit from continued acute skilled OT services to address deficits and help transition to next level of care. Patient would benefit from post acute skilled rehab facility with <3 hours of therapy and 24/7 support       If plan is discharge home, recommend the following: Two people to help with walking and/or transfers;A lot of help with bathing/dressing/bathroom;Assistance with cooking/housework;Assist for transportation;Help with stairs or ramp for entrance    Functional Status Assessment  Patient has had a recent decline in their functional status and demonstrates the ability to make significant improvements in function in a reasonable and predictable amount of time.  Equipment Recommendations  None recommended by OT (defer to next level of care)    Recommendations for Other Services       Precautions / Restrictions Precautions Precautions: Fall Restrictions Weight Bearing Restrictions: Yes LLE Weight Bearing: Weight bearing as tolerated      Mobility Bed Mobility Overal bed mobility: Needs Assistance Bed Mobility: Supine to Sit, Sit to  Supine     Supine to sit: Total assist, +2 for physical assistance Sit to supine: Total assist, +2 for physical assistance   General bed mobility comments: pt unable to assist with LE movements, dependent on helicopter transfer to reach EOB, mod-maxA to scoot at EOB    Transfers Overall transfer level: Needs assistance Equipment used: Rolling walker (2 wheels) Transfers: Sit to/from Stand Sit to Stand: Max assist, +2 physical assistance, From elevated surface           General transfer comment: max of 2 with blocking of R foot, assist and cues for posture and hip extension. completed x2      Balance Overall balance assessment: Needs assistance Sitting-balance support: No upper extremity supported, Feet supported Sitting balance-Leahy Scale: Fair     Standing balance support: Bilateral upper extremity supported, During functional activity Standing balance-Leahy Scale: Zero Standing balance comment: dependent on BUE support and maxA of 2                           ADL either performed or assessed with clinical judgement   ADL Overall ADL's : Needs assistance/impaired Eating/Feeding: Independent;Bed level   Grooming: Bed level;Set up   Upper Body Bathing: Bed level;Set up   Lower Body Bathing: Maximal assistance;Sitting/lateral leans   Upper Body Dressing : Set up;Bed level   Lower Body Dressing: Sit to/from stand;Total assistance   Toilet Transfer: +2 for physical assistance;Maximal assistance   Toileting- Clothing Manipulation and Hygiene: Total assistance;Sit to/from stand  Vision         Perception         Praxis         Pertinent Vitals/Pain Pain Assessment Pain Assessment: Faces Faces Pain Scale: Hurts even more Pain Location: LLE with mobility and wt bearing Pain Descriptors / Indicators: Discomfort, Grimacing, Moaning Pain Intervention(s): Limited activity within patient's tolerance, Monitored during session,  Repositioned     Extremity/Trunk Assessment     Lower Extremity Assessment Lower Extremity Assessment: Overall WFL for tasks assessed LLE Deficits / Details: limited by pain, able to complete partial ROM against gravity LLE: Unable to fully assess due to pain LLE Sensation: WNL LLE Coordination: WNL   Cervical / Trunk Assessment Cervical / Trunk Assessment: Kyphotic   Communication Communication Communication: Difficulty communicating thoughts/reduced clarity of speech Cueing Techniques: Verbal cues   Cognition Arousal: Alert Behavior During Therapy: [facility] for tasks assessed/performed                                   General Comments: pt giving inconsistent answers through session (at one point stating he mobilizes at home with crutches), able to follow simple cues and instructions. not formally assessed     General Comments  VSS on RA, pt soiled upon arrival and so NT present to change linens during stand    Exercises     Shoulder Instructions      Home Living Family/patient expects to be discharged to:: Skilled nursing facility [facility] health ALF)                                 Additional Comments: pt is poor historian, has assist with medication mangaement.      Prior Functioning/Environment Prior Level of Function : History of Falls (last six months);Patient poor historian/Family not available             Mobility Comments: He reports ambulating with crutches to transfer to w/c ADLs Comments: Pt reports being ind, poor historian        OT Problem List: Decreased strength;Impaired balance (sitting and/or standing);Pain      OT Treatment/Interventions: Self-care/ADL training;Balance training;Therapeutic exercise;Therapeutic activities;Patient/family education    OT Goals(Current goals can be found in the care plan section) Acute Rehab OT Goals Patient Stated Goal: none stated OT Goal Formulation: With patient Time For  Goal Achievement: 04/06/23 Potential to Achieve Goals: Good ADL Goals Pt Will Perform Grooming: sitting;with supervision;with set-up Pt Will Perform Upper Body Dressing: sitting;with set-up Pt Will Perform Lower Body Dressing: sitting/lateral leans;with contact guard assist Pt Will Transfer to Toilet: with mod assist;stand pivot transfer;bedside commode  OT Frequency: Min 1X/week    Co-evaluation PT/OT/SLP Co-Evaluation/Treatment: Yes Reason for Co-Treatment: Necessary to address cognition/behavior during functional activity;For patient/therapist safety;To address functional/ADL transfers PT goals addressed during session: Mobility/safety with mobility;Balance;Strengthening/ROM;Proper use of DME OT goals addressed during session: ADL's and self-care      AM-PAC OT "6 Clicks" Daily Activity     Outcome Measure Help from another person eating meals?: None Help from another person taking care of personal grooming?: A Little Help from another person toileting, which includes using toliet, bedpan, or urinal?: Total Help from another person bathing (including washing, rinsing, drying)?: A Lot Help from another person to put on and taking off regular upper body clothing?: A Little Help from another person to put on and  taking off regular lower body clothing?: Total 6 Click Score: 14   End of Session Equipment Utilized During Treatment: Gait belt;Rolling walker (2 wheels) Nurse Communication: Mobility status  Activity Tolerance: Patient tolerated treatment well Patient left: in bed;with call bell/phone within reach;with bed alarm set  OT Visit Diagnosis: Unsteadiness on feet (R26.81);Other abnormalities of gait and mobility (R26.89);Pain;Muscle weakness (generalized) (M62.81) Pain - Right/Left: Left Pain - part of body: Hip                Time: 1350-1431 OT Time Calculation (min): 41 min Charges:  OT General Charges $OT Visit: 1 Visit OT Evaluation $OT Eval Moderate Complexity: 1  Mod  03/23/2023  AB, OTR/L  Acute Rehabilitation Services  Office: [phone]   Tristan Schroeder 03/23/2023, 6:14 PM

## 2023-03-24 ENCOUNTER — Inpatient Hospital Stay (HOSPITAL_COMMUNITY): Payer: Medicare Other

## 2023-03-24 DIAGNOSIS — F32A Depression, unspecified: Secondary | ICD-10-CM | POA: Diagnosis not present

## 2023-03-24 DIAGNOSIS — S72002D Fracture of unspecified part of neck of left femur, subsequent encounter for closed fracture with routine healing: Secondary | ICD-10-CM | POA: Diagnosis not present

## 2023-03-24 DIAGNOSIS — E1142 Type 2 diabetes mellitus with diabetic polyneuropathy: Secondary | ICD-10-CM | POA: Diagnosis not present

## 2023-03-24 DIAGNOSIS — F4321 Adjustment disorder with depressed mood: Secondary | ICD-10-CM | POA: Diagnosis not present

## 2023-03-24 LAB — GLUCOSE, CAPILLARY
Glucose-Capillary: 104 mg/dL — ABNORMAL HIGH (ref 70–99)
Glucose-Capillary: 94 mg/dL (ref 70–99)
Glucose-Capillary: 102 mg/dL — ABNORMAL HIGH (ref 70–99)
Glucose-Capillary: 135 mg/dL — ABNORMAL HIGH (ref 70–99)

## 2023-03-24 NOTE — Plan of Care (Signed)
  Problem: Clinical Measurements: Goal: Ability to maintain clinical measurements within normal limits will improve Outcome: Progressing Goal: Will remain free from infection Outcome: Progressing   Problem: Nutrition: Goal: Adequate nutrition will be maintained Outcome: Progressing   

## 2023-03-24 NOTE — Progress Notes (Signed)
    Subjective: Reports pain as mild to moderate.  Tolerating diet.  Urinating.   No CP, SOB.  Worked with therapy yesterday.   Objective:   VITALS:   Vitals:   03/23/23 1540 03/23/23 2033 03/24/23 0532 03/24/23 0814  BP: 119/76 116/87 (!) 142/71 138/77  Pulse: 88 87 74 72  Resp: 17 16 16 16   Temp: 99.3 F (37.4 C) 98.3 F (36.8 C) 98.5 F (36.9 C) 98.5 F (36.9 C)  TempSrc: Oral Oral  Oral  SpO2: 92%  (!) 85% 92%  Weight:      Height:          Latest Ref Rng & Units 03/23/2023    8:38 AM 03/22/2023    1:20 AM 03/21/2023    4:26 PM  CBC  WBC 4.0 - 10.5 K/uL 9.0  9.2  12.7   Hemoglobin 13.0 - 17.0 g/dL 8.5  8.9  9.8   Hematocrit 39.0 - 52.0 % 25.7  27.5  31.0   Platelets 150 - 400 K/uL 213  224  225       Latest Ref Rng & Units 03/23/2023    8:38 AM 03/22/2023    1:20 AM 03/21/2023    4:26 PM  BMP  Glucose 70 - 99 mg/dL 161  096  045   BUN 8 - 23 mg/dL 20  24  24    Creatinine 0.61 - 1.24 mg/dL 4.09  8.11  9.14   Sodium 135 - 145 mmol/L 134  137  136   Potassium 3.5 - 5.1 mmol/L 4.6  4.2  4.5   Chloride 98 - 111 mmol/L 101  104  102   CO2 22 - 32 mmol/L 27  26  25    Calcium 8.9 - 10.3 mg/dL 8.2  8.1  8.4    Intake/Output      08/09 0701 08/10 0700 08/10 0701 08/11 0700   I.V. (mL/kg)     IV Piggyback     Total Intake(mL/kg)     Urine (mL/kg/hr) 750 (0.4) 300 (1.1)   Blood     Total Output 750 300   Net -750 -300           Physical Exam: General: NAD.  Resting in bed, calm, comfortable Resp: No increased wob Cardio: regular rate and rhythm ABD soft Neurologically intact MSK Neurovascularly intact Sensation intact distally Intact pulses distally Dorsiflexion/Plantar flexion intact Incision: dressing C/D/I   Assessment: 2 Days Post-Op  S/P Procedure(s) (LRB): PERCUTANEOUS FIXATION OF FEMORAL NECK (Left) by Dr. Jewel Baize. Eulah Pont on 03/22/23  Principal Problem:   Closed left hip fracture (HCC) Active Problems:   Vascular dementia (HCC)   Adjustment  disorder with depressed mood   Type 2 diabetes mellitus with peripheral neuropathy (HCC)   Depression   Dyslipidemia   Plan:  Advance diet Up with therapy if able Incentive Spirometry Elevate and Apply ice  Weightbearing: WBAT LLE Insicional and dressing care: Dressings left intact until follow-up and Reinforce dressings as needed Orthopedic device(s): None Showering: Keep dressing dry VTE prophylaxis: Lovenox 40mg  qd  while inpatient, can switch to ASA 81mg  bid x 30 days upon discharge , SCDs, ambulation Pain control: PRN Follow - up plan: 2 weeks Contact information:  Margarita Rana MD, Levester Fresh PA-C  Dispo: Skilled Nursing Facility/Rehab Ok to d/c from ortho standpoint once medically ready.     Vernetta Honey, PA-C Office [phone] 03/24/2023, 10:01 AM

## 2023-03-24 NOTE — Plan of Care (Signed)
  Problem: Activity: Goal: Risk for activity intolerance will decrease Outcome: Progressing   Problem: Nutrition: Goal: Adequate nutrition will be maintained Outcome: Progressing   Problem: Safety: Goal: Ability to remain free from injury will improve Outcome: Progressing   

## 2023-03-24 NOTE — Progress Notes (Signed)
PROGRESS NOTE    Krishav Lassila  BPZ:[id number] DOB: 12/21/1950 DOA: 03/21/2023 PCP: Eloisa Northern, MD   Brief Narrative:  Bryan Farrell is a 72 y.o. male with PMH significant for diabetes mellitus, vascular dementia, depression, adjustment disorder, wheelchair-bound lives at [facility] health assisted living facility presented in the hospital from Dr. Greig Right office as direct admit for left hip fracture. Hospitalist called for admission, plan for ORIF per [facility] schedule  Assessment & Plan:   Principal Problem:   Closed left hip fracture (HCC) Active Problems:   Adjustment disorder with depressed mood   Type 2 diabetes mellitus with peripheral neuropathy (HCC)   Depression   Dyslipidemia   Vascular dementia (HCC)  Left femoral Neck fracture secondary to mechanical fall, POA: X-ray showed left femoral neck fracture outpatient at ortho office Orthopedics following - Dr Eulah Pont - percutaneous fixation of femoral neck 8/9, tolerated well. PT/OT to follow -patient is reportedly wheelchair-bound at baseline   Hypoxia, unspecified -Patient carries no complaints of respiratory symptoms, remains hypoxic despite attempted wean -Chest x-ray unremarkable for any acute findings, clinical exam equally unremarkable -Will continue to follow along and wean oxygen as tolerated.  Adjustment disorder with depressed mood: Continue Zoloft 150 mg daily.   Type 2 diabetes: Hold p.o. diabetic medications. Continue Semglee 10 units daily Continue regular insulin sliding scale.   Obtain hemoglobin A1c   Hyperlipidemia: Continue Lipitor   Vascular dementia: No behavioral problems. Appears at baseline mental status   Peripheral neuropathy Continue gabapentin.  DVT prophylaxis: enoxaparin (LOVENOX) injection 40 mg Start: 03/23/23 1700 SCDs Start: 03/22/23 1613 Code Status:   Code Status: Full Code Family Communication: None present  Status is: Inpt  Dispo: The patient is from: Home               Anticipated d/c is to: Home              Anticipated d/c date is: 24-48h pending clinical/surgical course              Patient currently NOT medically stable for discharge  Consultants:  Ortho  Procedures:  Pending above (ORIF planned)  Antimicrobials:  Perioperatively   Subjective: No acute issues/events overnight, pain well controlled  Objective: Vitals:   03/23/23 0727 03/23/23 1540 03/23/23 2033 03/24/23 0532  BP: 131/78 119/76 116/87 (!) 142/71  Pulse: 80 88 87 74  Resp: 17 17 16 16   Temp: 98.6 F (37 C) 99.3 F (37.4 C) 98.3 F (36.8 C) 98.5 F (36.9 C)  TempSrc: Oral Oral Oral   SpO2: 91% 92%  (!) 85%  Weight:      Height:        Intake/Output Summary (Last 24 hours) at 03/24/2023 0751 Last data filed at 03/23/2023 1840 Gross per 24 hour  Intake --  Output 400 ml  Net -400 ml   Filed Weights   03/21/23 1800 03/22/23 1238  Weight: 88.5 kg 89 kg    Examination:  General exam: Appears calm and comfortable  Respiratory system: Clear to auscultation. Respiratory effort normal. Cardiovascular system: S1 & S2 heard, RRR. No JVD, murmurs, rubs, gallops or clicks. No pedal edema. Gastrointestinal system: Abdomen is nondistended, soft and nontender. No organomegaly or masses felt. Normal bowel sounds heard. Central nervous system: Alert and oriented to person. No focal neurological deficits. Extremities: LLE rom/strength testing limited by pain; hip bandage clean/dry/intact Skin: No rashes, lesions or ulcers   Data Reviewed: I have personally reviewed following labs and imaging studies  CBC: Recent Labs  Lab 03/21/23 1626 03/22/23 0120 03/23/23 0838  WBC 12.7* 9.2 9.0  HGB 9.8* 8.9* 8.5*  HCT 31.0* 27.5* 25.7*  MCV 93.9 92.9 92.1  PLT 225 224 213   Basic Metabolic Panel: Recent Labs  Lab 03/21/23 1626 03/22/23 0120 03/23/23 0838  NA 136 137 134*  K 4.5 4.2 4.6  CL 102 104 101  CO2 25 26 27   GLUCOSE 170* 184* 126*  BUN 24* 24* 20   CREATININE 1.37* 1.39* 1.32*  CALCIUM 8.4* 8.1* 8.2*  MG 1.8 1.8  --   PHOS 4.1 3.3  --    GFR: Estimated Creatinine Clearance: 58 mL/min (A) (by C-G formula based on SCr of 1.32 mg/dL (H)).  HbA1C: Recent Labs    03/21/23 1626  HGBA1C 7.5*   CBG: Recent Labs  Lab 03/22/23 2155 03/23/23 0721 03/23/23 1114 03/23/23 1611 03/23/23 2146  GLUCAP 138* 111* 115* 185* 176*   Radiology Studies: DG HIP UNILAT WITH PELVIS 2-3 VIEWS LEFT  Result Date: 03/22/2023 CLINICAL DATA:  Left hip pinning. EXAM: DG HIP (WITH OR WITHOUT PELVIS) 2-3V LEFT COMPARISON:  Pelvis and left hip radiographs 03/21/2023 FINDINGS: Images were performed intraoperatively without the presence of a radiologist. Interval placement of 3 cannulated screws fixating the previously seen proximal left femoral fracture. No hardware complication is seen. Total fluoroscopy images: 2 Total fluoroscopy time: 51 seconds Total dose: Radiation Exposure Index (as provided by the fluoroscopic device): 14.18 mGy air Kerma Please see intraoperative findings for further detail. IMPRESSION: Intraoperative fluoroscopic guidance for left hip pinning. Electronically Signed   By: Neita Garnet M.D.   On: 03/22/2023 16:14   Pelvis Portable  Result Date: 03/22/2023 CLINICAL DATA:  Hip fracture EXAM: PORTABLE PELVIS 1-2 VIEWS COMPARISON:  03/21/2023 FINDINGS: Three cannulated screws span the LEFT femoral neck. No complication. RIGHT total hip arthroplasty. IMPRESSION: yesLEFT femoral neck fixation. Electronically Signed   By: Genevive Bi M.D.   On: 03/22/2023 16:04   DG C-Arm 1-60 Min-No Report  Result Date: 03/22/2023 Fluoroscopy was utilized by the requesting physician.  No radiographic interpretation.        Scheduled Meds:  atorvastatin  40 mg Oral QHS   divalproex  125 mg Oral TID   docusate sodium  100 mg Oral BID   enoxaparin (LOVENOX) injection  40 mg Subcutaneous Q24H   ferrous sulfate  325 mg Oral Q breakfast   gabapentin   100 mg Oral TID   insulin aspart  0-6 Units Subcutaneous TID WC   insulin glargine-yfgn  10 Units Subcutaneous QHS   traMADol  50 mg Oral TID   Continuous Infusions:  sodium chloride 50 mL/hr at 03/22/23 1945   methocarbamol (ROBAXIN) IV       LOS: 3 days   Time spent:  Azucena Fallen, DO [facility]  If 7PM-7AM, please contact night-coverage [email or web address]  03/24/2023, 7:51 AM

## 2023-03-25 DIAGNOSIS — S72002D Fracture of unspecified part of neck of left femur, subsequent encounter for closed fracture with routine healing: Secondary | ICD-10-CM | POA: Diagnosis not present

## 2023-03-25 DIAGNOSIS — F4321 Adjustment disorder with depressed mood: Secondary | ICD-10-CM | POA: Diagnosis not present

## 2023-03-25 DIAGNOSIS — F32A Depression, unspecified: Secondary | ICD-10-CM | POA: Diagnosis not present

## 2023-03-25 DIAGNOSIS — E1142 Type 2 diabetes mellitus with diabetic polyneuropathy: Secondary | ICD-10-CM | POA: Diagnosis not present

## 2023-03-25 LAB — GLUCOSE, CAPILLARY
Glucose-Capillary: 76 mg/dL (ref 70–99)
Glucose-Capillary: 102 mg/dL — ABNORMAL HIGH (ref 70–99)
Glucose-Capillary: 117 mg/dL — ABNORMAL HIGH (ref 70–99)

## 2023-03-25 LAB — CBC
HCT: 24.5 % — ABNORMAL LOW (ref 39.0–52.0)
Hemoglobin: 7.9 g/dL — ABNORMAL LOW (ref 13.0–17.0)
MCH: 29 pg (ref 26.0–34.0)
MCHC: 32.2 g/dL (ref 30.0–36.0)
MCV: 90.1 fL (ref 80.0–100.0)
Platelets: 224 10*3/uL (ref 150–400)
RBC: 2.72 MIL/uL — ABNORMAL LOW (ref 4.22–5.81)
RDW: 14.5 % (ref 11.5–15.5)
WBC: 6.2 10*3/uL (ref 4.0–10.5)
nRBC: 0 % (ref 0.0–0.2)

## 2023-03-25 LAB — BASIC METABOLIC PANEL WITH GFR
Anion gap: 6 (ref 5–15)
BUN: 15 mg/dL (ref 8–23)
CO2: 28 mmol/L (ref 22–32)
Calcium: 8.2 mg/dL — ABNORMAL LOW (ref 8.9–10.3)
Chloride: 101 mmol/L (ref 98–111)
Creatinine, Ser: 1.14 mg/dL (ref 0.61–1.24)
GFR, Estimated: >60 mL/min (ref >60)
Glucose, Bld: 98 mg/dL (ref 70–99)
Potassium: 3.7 mmol/L (ref 3.5–5.1)
Sodium: 135 mmol/L (ref 135–145)

## 2023-03-25 NOTE — Plan of Care (Signed)
  Problem: Activity: Goal: Risk for activity intolerance will decrease Outcome: Progressing   Problem: Nutrition: Goal: Adequate nutrition will be maintained Outcome: Progressing   Problem: Clinical Measurements: Goal: Respiratory complications will improve Outcome: Progressing

## 2023-03-25 NOTE — Progress Notes (Signed)
PROGRESS NOTE    Bryan Farrell  NUU:[id number] DOB: 1951-07-15 DOA: 03/21/2023 PCP: Eloisa Northern, MD   Brief Narrative:  Bryan Farrell is a 72 y.o. male with PMH significant for diabetes mellitus, vascular dementia, depression, adjustment disorder, wheelchair-bound lives at [facility] health assisted living facility presented in the hospital from Dr. Greig Right office as direct admit for left hip fracture. Hospitalist called for admission, plan for ORIF per [facility] schedule  Assessment & Plan:   Principal Problem:   Closed left hip fracture (HCC) Active Problems:   Adjustment disorder with depressed mood   Type 2 diabetes mellitus with peripheral neuropathy (HCC)   Depression   Dyslipidemia   Vascular dementia (HCC)  Left femoral Neck fracture secondary to mechanical fall, POA: X-ray showed left femoral neck fracture outpatient at ortho office Orthopedics following - Dr Eulah Pont - percutaneous fixation of femoral neck 8/9, tolerated well. PT/OT to follow -patient is reportedly wheelchair-bound at baseline   Hypoxia, unspecified, likely chronic -Patient carries no complaints of respiratory symptoms, remains hypoxic despite attempted wean on multiple days - Concern this is(has been) the patient's baseline for some time but has not been evaluated given lack of symptoms while hypoxic. -Chest x-ray unremarkable for any acute findings, clinical exam equally unremarkable -Will continue to follow along - weaned O2 again 11th with resting sats in the mid/low 80s.  Adjustment disorder with depressed mood: Continue Zoloft 150 mg daily.   Type 2 diabetes: Hold p.o. diabetic medications. Continue Semglee 10 units daily Continue regular insulin sliding scale.   Obtain hemoglobin A1c   Hyperlipidemia: Continue Lipitor   Vascular dementia: No behavioral problems. Appears at baseline mental status   Peripheral neuropathy Continue gabapentin.  DVT prophylaxis: enoxaparin (LOVENOX)  injection 40 mg Start: 03/23/23 1700 SCDs Start: 03/22/23 1613 Code Status:   Code Status: Full Code Family Communication: None present  Status is: Inpt  Dispo: The patient is from: Facility              Anticipated d/c is to: Same              Anticipated d/c date is: medically stable for discharge              Patient currently IS medically stable for discharge  Consultants:  Ortho  Procedures:  Pending above (ORIF planned)  Antimicrobials:  Perioperatively   Subjective: No acute issues/events overnight, pain well controlled  Objective: Vitals:   03/24/23 0814 03/24/23 1549 03/24/23 2111 03/25/23 0403  BP: 138/77 (!) 144/78 113/63 112/66  Pulse: 72 70 81 73  Resp: 16 17 17 17   Temp: 98.5 F (36.9 C) 98.4 F (36.9 C) 98 F (36.7 C) 98 F (36.7 C)  TempSrc: Oral Oral Oral   SpO2: 92% 98% 91% 99%  Weight:      Height:        Intake/Output Summary (Last 24 hours) at 03/25/2023 0808 Last data filed at 03/24/2023 1810 Gross per 24 hour  Intake --  Output 800 ml  Net -800 ml   Filed Weights   03/21/23 1800 03/22/23 1238  Weight: 88.5 kg 89 kg    Examination:  General exam: Appears calm and comfortable  Respiratory system: Clear to auscultation. Respiratory effort normal. Cardiovascular system: S1 & S2 heard, RRR. No JVD, murmurs, rubs, gallops or clicks. No pedal edema. Gastrointestinal system: Abdomen is nondistended, soft and nontender. No organomegaly or masses felt. Normal bowel sounds heard. Central nervous system: Alert and oriented to person. No  focal neurological deficits. Extremities: LLE rom/strength testing limited by pain; hip bandage clean/dry/intact Skin: No rashes, lesions or ulcers   Data Reviewed: I have personally reviewed following labs and imaging studies  CBC: Recent Labs  Lab 03/21/23 1626 03/22/23 0120 03/23/23 0838 03/25/23 0604  WBC 12.7* 9.2 9.0 6.2  HGB 9.8* 8.9* 8.5* 7.9*  HCT 31.0* 27.5* 25.7* 24.5*  MCV 93.9 92.9 92.1  90.1  PLT 225 224 213 224   Basic Metabolic Panel: Recent Labs  Lab 03/21/23 1626 03/22/23 0120 03/23/23 0838 03/25/23 0604  NA 136 137 134* 135  K 4.5 4.2 4.6 3.7  CL 102 104 101 101  CO2 25 26 27 28   GLUCOSE 170* 184* 126* 98  BUN 24* 24* 20 15  CREATININE 1.37* 1.39* 1.32* 1.14  CALCIUM 8.4* 8.1* 8.2* 8.2*  MG 1.8 1.8  --   --   PHOS 4.1 3.3  --   --    GFR: Estimated Creatinine Clearance: 67.1 mL/min (by C-G formula based on SCr of 1.14 mg/dL).  HbA1C: No results for input(s): "HGBA1C" in the last 72 hours.  CBG: Recent Labs  Lab 03/23/23 2146 03/24/23 0822 03/24/23 1131 03/24/23 1550 03/24/23 2111  GLUCAP 176* 104* 94 102* 135*   Radiology Studies: DG CHEST PORT 1 VIEW  Result Date: 03/24/2023 CLINICAL DATA:  200808 Hypoxia [id number] EXAM: PORTABLE CHEST 1 VIEW COMPARISON:  04/17/2022 chest radiograph. FINDINGS: Stable cardiomediastinal silhouette with normal heart size. No pneumothorax. No pleural effusion. Lungs appear clear, with no acute consolidative airspace disease and no pulmonary edema. IMPRESSION: No active disease. Electronically Signed   By: Delbert Phenix M.D.   On: 03/24/2023 09:40        Scheduled Meds:  atorvastatin  40 mg Oral QHS   divalproex  125 mg Oral TID   docusate sodium  100 mg Oral BID   enoxaparin (LOVENOX) injection  40 mg Subcutaneous Q24H   ferrous sulfate  325 mg Oral Q breakfast   gabapentin  100 mg Oral TID   insulin aspart  0-6 Units Subcutaneous TID WC   insulin glargine-yfgn  10 Units Subcutaneous QHS   traMADol  50 mg Oral TID   Continuous Infusions:  sodium chloride 50 mL/hr at 03/22/23 1945   methocarbamol (ROBAXIN) IV       LOS: 4 days   Time spent:  Azucena Fallen, DO [facility]  If 7PM-7AM, please contact night-coverage [email or web address]  03/25/2023, 8:08 AM

## 2023-03-26 ENCOUNTER — Inpatient Hospital Stay (HOSPITAL_COMMUNITY): Payer: Medicare Other

## 2023-03-26 DIAGNOSIS — S72002D Fracture of unspecified part of neck of left femur, subsequent encounter for closed fracture with routine healing: Secondary | ICD-10-CM | POA: Diagnosis not present

## 2023-03-26 DIAGNOSIS — E1142 Type 2 diabetes mellitus with diabetic polyneuropathy: Secondary | ICD-10-CM | POA: Diagnosis not present

## 2023-03-26 DIAGNOSIS — F4321 Adjustment disorder with depressed mood: Secondary | ICD-10-CM | POA: Diagnosis not present

## 2023-03-26 DIAGNOSIS — F32A Depression, unspecified: Secondary | ICD-10-CM | POA: Diagnosis not present

## 2023-03-26 LAB — GLUCOSE, CAPILLARY
Glucose-Capillary: 135 mg/dL — ABNORMAL HIGH (ref 70–99)
Glucose-Capillary: 110 mg/dL — ABNORMAL HIGH (ref 70–99)
Glucose-Capillary: 133 mg/dL — ABNORMAL HIGH (ref 70–99)
Glucose-Capillary: 122 mg/dL — ABNORMAL HIGH (ref 70–99)

## 2023-03-26 LAB — D-DIMER, QUANTITATIVE: D-Dimer, Quant: 2.86 ug/mL-FEU — ABNORMAL HIGH (ref 0.00–0.50)

## 2023-03-26 MED ORDER — IOHEXOL 350 MG/ML SOLN
75.0000 mL | Freq: Once | INTRAVENOUS | Status: AC | PRN
  Administered 2023-03-26: 75 mL via INTRAVENOUS

## 2023-03-26 NOTE — Progress Notes (Signed)
Occupational Therapy Treatment Patient Details Name: Bryan Farrell MRN: [id number] DOB: 09-Jun-1951 Today's Date: 03/26/2023   History of present illness The pt is a 72 yo male presenting 8/7 from SNF after a fall getting from bed to [facility]. Pt found to have L hip f/x and is s/p percutaneous fixation of femoral neck on 8/8. 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   OT comments  Patient progressing slowly towards OT goals. Supine pt completed gown change and groin bathing with min to setup assist. Completing bed mobility with mod assist, transfers using stedy to recliner with max assist +2. Remains limited by pain, activity tolerance, and cognition.  Continue to recommend <3hrs/day inpatient setting therapy. Will follow.       If plan is discharge home, recommend the following:  Two people to help with walking and/or transfers;A lot of help with bathing/dressing/bathroom;Assistance with cooking/housework;Assist for transportation;Help with stairs or ramp for entrance   Equipment Recommendations  None recommended by OT (defer)    Recommendations for Other Services      Precautions / Restrictions Precautions Precautions: Fall Restrictions Weight Bearing Restrictions: Yes LLE Weight Bearing: Weight bearing as tolerated       Mobility Bed Mobility Overal bed mobility: Needs Assistance Bed Mobility: Supine to Sit     Supine to sit: Mod assist, HOB elevated, Used rails     General bed mobility comments: modA to BLE and min-modA at trunk, cued for hand placement on rails, increased time to scoot    Transfers Overall transfer level: Needs assistance Equipment used: Rolling walker (2 wheels) Transfers: Sit to/from Stand, Bed to chair/wheelchair/BSC Sit to Stand: Max assist, +2 physical assistance, From elevated surface           General transfer comment: mod-maxA of 2 with pt slow to rise, cues for hip extension and posture. unable to  take steps from standing. stedy used to allow safe transfer to Patent examiner via Lift Equipment: Stedy   Balance Overall balance assessment: Needs assistance Sitting-balance support: No upper extremity supported, Feet supported Sitting balance-Leahy Scale: Fair Sitting balance - Comments: can static sit without support   Standing balance support: Bilateral upper extremity supported, During functional activity Standing balance-Leahy Scale: Poor Standing balance comment: static stance with BUE support and maxA of2                           ADL either performed or assessed with clinical judgement   ADL Overall ADL's : Needs assistance/impaired Eating/Feeding: Independent;Bed level   Grooming: Set up;Sitting           Upper Body Dressing : Set up;Bed level Upper Body Dressing Details (indicate cue type and reason): changing gown Lower Body Dressing: Sit to/from stand;Total assistance Lower Body Dressing Details (indicate cue type and reason): using stedy Toilet Transfer: Maximal assistance;+2 for physical assistance;+2 for safety/equipment Toilet Transfer Details (indicate cue type and reason): simulated to recliner using stedy         Functional mobility during ADLs: Maximal assistance;+2 for physical assistance;+2 for safety/equipment      Extremity/Trunk Assessment              Vision       Perception     Praxis      Cognition Arousal: Alert Behavior During Therapy: [facility] for tasks assessed/performed Overall Cognitive Status: No family/caregiver present to determine baseline cognitive functioning  General Comments: pt able to follow all simple cues this session, continues to have limited insight to PLOF, answering most questions about use of DME with "I think" or "I guess"        Exercises      Shoulder Instructions       General Comments SpO2 to low of 86% on RA after 15 min. recovered to  high 90s with 2L O2    Pertinent Vitals/ Pain       Pain Assessment Pain Assessment: Faces Faces Pain Scale: Hurts even more Pain Location: LLE with mobility and wt bearing Pain Descriptors / Indicators: Discomfort, Grimacing, Moaning Pain Intervention(s): Limited activity within patient's tolerance, Monitored during session, Repositioned  Home Living                                          Prior Functioning/Environment              Frequency  Min 1X/week        Progress Toward Goals  OT Goals(current goals can now be found in the care plan section)  Progress towards OT goals: Progressing toward goals  Acute Rehab OT Goals Patient Stated Goal: none stated OT Goal Formulation: Patient unable to participate in goal setting Time For Goal Achievement: 04/06/23 Potential to Achieve Goals: Good  Plan      Co-evaluation    PT/OT/SLP Co-Evaluation/Treatment: Yes Reason for Co-Treatment: Necessary to address cognition/behavior during functional activity;For patient/therapist safety;To address functional/ADL transfers PT goals addressed during session: Mobility/safety with mobility;Balance;Strengthening/ROM;Proper use of DME OT goals addressed during session: ADL's and self-care      AM-PAC OT "6 Clicks" Daily Activity     Outcome Measure   Help from another person eating meals?: None Help from another person taking care of personal grooming?: A Little Help from another person toileting, which includes using toliet, bedpan, or urinal?: Total Help from another person bathing (including washing, rinsing, drying)?: A Lot Help from another person to put on and taking off regular upper body clothing?: A Little Help from another person to put on and taking off regular lower body clothing?: Total 6 Click Score: 14    End of Session Equipment Utilized During Treatment: Gait belt;Rolling walker (2 wheels)  OT Visit Diagnosis: Unsteadiness on feet  (R26.81);Other abnormalities of gait and mobility (R26.89);Pain;Muscle weakness (generalized) (M62.81) Pain - Right/Left: Left Pain - part of body: Hip   Activity Tolerance Patient tolerated treatment well   Patient Left in chair;with call bell/phone within reach;with chair alarm set   Nurse Communication Mobility status        Time: 8295-6213 OT Time Calculation (min): 29 min  Charges: OT General Charges $OT Visit: 1 Visit OT Treatments $Self Care/Home Management : 8-22 mins  Barry Brunner, OT Acute Rehabilitation Services Office [phone]   Chancy Milroy 03/26/2023, 1:12 PM

## 2023-03-26 NOTE — NC FL2 (Signed)
[place] MEDICAID [facility] LEVEL OF CARE FORM     IDENTIFICATION  Patient Name: Bryan Farrell Birthdate: 06-18-1951 Sex: male Admission Date (Current Location): 03/21/2023  [place] and [place] Number:  Haynes Bast [id number] L Facility and Address:  [facility], [address] [place], [place] [address]      Provider Number: [id number]  Attending Physician Name and Address:  Azucena Fallen, MD  Relative Name and Phone Number:  [place], Dion [phone]    Current Level of Care: Hospital Recommended Level of Care: Skilled Nursing Facility Prior Approval Number:    Date Approved/Denied:   PASRR Number: [id number]  Discharge Plan: SNF    Current Diagnoses: Patient Active Problem List   Diagnosis Date Noted   Closed left hip fracture (HCC) 03/21/2023   Malnutrition of moderate degree 04/20/2022   Closed right hip fracture (HCC) 04/18/2022   Type 2 diabetes mellitus with peripheral neuropathy (HCC) 04/18/2022   Depression 04/18/2022   Dyslipidemia 04/18/2022   Closed subcapital fracture of right femur (HCC)    Toe osteomyelitis, right (HCC)    AKI (acute kidney injury) (HCC) 09/02/2021   Hypokalemia 09/02/2021   Influenza A 09/02/2021   Sepsis due to cellulitis (HCC) 08/25/2021   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HCC)    Bacteremia due to Enterococcus 05/29/2019   Toe osteomyelitis, left (HCC)    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)    Mild protein-calorie malnutrition (HCC)    Osteomyelitis (HCC) 05/27/2019   Moderate recurrent major depression (HCC)    Adjustment disorder with depressed mood 04/13/2017   Vitamin B12 deficiency 03/25/2017   Vascular dementia (HCC) 03/23/2017   Suicidal ideation 03/23/2017   Homelessness 03/23/2017   Diabetes (HCC) 03/23/2017    Orientation RESPIRATION BLADDER Height & Weight     Self, Situation, Place  O2 Continent Weight: 196 lb 3.4 oz (89 kg) Height:  6' (182.9 cm)  BEHAVIORAL  SYMPTOMS/MOOD NEUROLOGICAL BOWEL NUTRITION STATUS      Continent Diet (see discharge summary)  AMBULATORY STATUS COMMUNICATION OF NEEDS Skin   Total Care Verbally Surgical wounds                       Personal Care Assistance Level of Assistance  Bathing, Feeding, Dressing, Total care Bathing Assistance: Maximum assistance Feeding assistance: Independent Dressing Assistance: Maximum assistance Total Care Assistance: Maximum assistance   Functional Limitations Info  Sight, Hearing, Speech Sight Info: Adequate Hearing Info: Impaired Speech Info: Adequate    SPECIAL CARE FACTORS FREQUENCY  PT (By licensed PT), OT (By licensed OT)     PT Frequency: 5x week OT Frequency: 5x week            Contractures Contractures Info: Not present    Additional Factors Info  Code Status, Allergies, Insulin Sliding Scale Code Status Info: full Allergies Info: NKA   Insulin Sliding Scale Info: Novolog: see discharge summary       Current Medications (03/26/2023):  This is the current hospital active medication list Current Facility-Administered Medications  Medication Dose Route Frequency Provider Last Rate Last Admin   acetaminophen (TYLENOL) tablet 650 mg  650 mg Oral Q6H PRN Gawne, Meghan M, PA-C       Or   acetaminophen (TYLENOL) suppository 650 mg  650 mg Rectal Q6H PRN Gawne, Meghan M, PA-C       atorvastatin (LIPITOR) tablet 40 mg  40 mg Oral QHS Gawne, Meghan M, PA-C   40 mg  at 03/25/23 2139   bisacodyl (DULCOLAX) suppository 10 mg  10 mg Rectal Daily PRN Levester Fresh M, PA-C       divalproex (DEPAKOTE) DR tablet 125 mg  125 mg Oral TID Levester Fresh M, PA-C   125 mg at 03/26/23 0944   docusate sodium (COLACE) capsule 100 mg  100 mg Oral BID Levester Fresh M, PA-C   100 mg at 03/26/23 0945   enoxaparin (LOVENOX) injection 40 mg  40 mg Subcutaneous Q24H Azucena Fallen, MD   40 mg at 03/25/23 1647   ferrous sulfate tablet 325 mg  325 mg Oral Q breakfast Jenne Pane,  PA-C   325 mg at 03/26/23 0944   gabapentin (NEURONTIN) capsule 100 mg  100 mg Oral TID Jenne Pane, PA-C   100 mg at 03/26/23 0944   HYDROcodone-acetaminophen (NORCO) 7.5-325 MG per tablet 1-2 tablet  1-2 tablet Oral Q4H PRN Levester Fresh M, PA-C       HYDROcodone-acetaminophen (NORCO/VICODIN) 5-325 MG per tablet 1-2 tablet  1-2 tablet Oral Q4H PRN Gawne, Meghan M, PA-C       insulin aspart (novoLOG) injection 0-6 Units  0-6 Units Subcutaneous TID WC Jenne Pane, PA-C   1 Units at 03/23/23 1837   insulin glargine-yfgn (SEMGLEE) injection 10 Units  10 Units Subcutaneous QHS Jenne Pane, PA-C   10 Units at 03/25/23 2140   menthol-cetylpyridinium (CEPACOL) lozenge 3 mg  1 lozenge Oral PRN Levester Fresh M, PA-C       Or   phenol (CHLORASEPTIC) mouth spray 1 spray  1 spray Mouth/Throat PRN Gawne, Lindie Spruce M, PA-C       methocarbamol (ROBAXIN) tablet 500 mg  500 mg Oral Q6H PRN Gawne, Meghan M, PA-C       Or   methocarbamol (ROBAXIN) 500 mg in dextrose 5 % 50 mL IVPB  500 mg Intravenous Q6H PRN Gawne, Meghan M, PA-C       metoCLOPramide (REGLAN) tablet 5-10 mg  5-10 mg Oral Q8H PRN Gawne, Meghan M, PA-C       Or   metoCLOPramide (REGLAN) injection 5-10 mg  5-10 mg Intravenous Q8H PRN Gawne, Meghan M, PA-C       morphine (PF) 2 MG/ML injection 0.5-1 mg  0.5-1 mg Intravenous Q2H PRN Gawne, Meghan M, PA-C       ondansetron [facility]) tablet 4 mg  4 mg Oral Q6H PRN Gawne, Meghan M, PA-C       Or   ondansetron (ZOFRAN) injection 4 mg  4 mg Intravenous Q6H PRN Gawne, Meghan M, PA-C       polyethylene glycol (MIRALAX / GLYCOLAX) packet 17 g  17 g Oral Daily PRN Gawne, Meghan M, PA-C       traMADol (ULTRAM) tablet 50 mg  50 mg Oral TID Gawne, Meghan M, PA-C   50 mg at 03/26/23 0945     Discharge Medications: Please see discharge summary for a list of discharge medications.  Relevant Imaging Results:  Relevant Lab Results:   Additional Information SSN [id number]  Lorri Frederick,  LCSW

## 2023-03-26 NOTE — Care Management Important Message (Signed)
Important Message  Patient Details  Name: Bryan Farrell MRN: [id number] Date of Birth: 01-20-1951   Medicare Important Message Given:  Yes     Sherilyn Banker 03/26/2023, 2:22 PM

## 2023-03-26 NOTE — Plan of Care (Signed)
  Problem: Education: Goal: Knowledge of General Education information will improve Description: Including pain rating scale, medication(s)/side effects and non-pharmacologic comfort measures Outcome: Progressing   Problem: Health Behavior/Discharge Planning: Goal: Ability to manage health-related needs will improve Outcome: Progressing   Problem: Activity: Goal: Risk for activity intolerance will decrease Outcome: Progressing   

## 2023-03-26 NOTE — Progress Notes (Signed)
Physical Therapy Treatment Patient Details Name: Bryan Farrell MRN: [id number] DOB: October 10, 1950 Today's Date: 03/26/2023   History of Present Illness The pt is a 72 yo male presenting 8/7 from SNF after a fall getting from bed to [facility]. Pt found to have L hip f/x and is s/p percutaneous fixation of femoral neck on 8/8. 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    PT Comments  The pt was agreeable to session, demos significant improvement in bed mobility this Farrell, needing only modA to complete with HOB elevated and cues for use of bed rails. He continues to require significant assist to stand and maintain static stance at EOB, unable to progress to taking steps at this time due to pain. The pt tolerated use of stedy for transfer to recliner, and was able to stand from lower surface of recliner with maxA of 2 and use of stedy. Continue to recommend return to rehab facility for continued inpatient rehab < 3hrs/day.     If plan is discharge home, recommend the following: Two people to help with walking and/or transfers;Two people to help with bathing/dressing/bathroom;Assistance with cooking/housework;Direct supervision/assist for medications management;Direct supervision/assist for financial management;Assist for transportation;Help with stairs or ramp for entrance   Can travel by private vehicle     No  Equipment Recommendations  Other (comment) (defer to post acute)    Recommendations for Other Services       Precautions / Restrictions Precautions Precautions: Fall Restrictions Weight Bearing Restrictions: Yes LLE Weight Bearing: Weight bearing as tolerated     Mobility  Bed Mobility Overal bed mobility: Needs Assistance Bed Mobility: Supine to Sit     Supine to sit: Mod assist, HOB elevated, Used rails     General bed mobility comments: modA to BLE and min-modA at trunk, cued for hand placement on rails, increased time to scoot     Transfers Overall transfer level: Needs assistance Equipment used: Rolling walker (2 wheels) Transfers: Sit to/from Stand, Bed to chair/wheelchair/BSC Sit to Stand: Max assist, +2 physical assistance, From elevated surface           General transfer comment: mod-maxA of 2 with pt slow to rise, cues for hip extension and posture. unable to take steps from standing. stedy used to allow safe transfer to Patent examiner via Lift Equipment: Stedy  Ambulation/Gait               General Gait Details: pt unable      Balance Overall balance assessment: Needs assistance Sitting-balance support: No upper extremity supported, Feet supported Sitting balance-Leahy Scale: Fair Sitting balance - Comments: can static sit without support   Standing balance support: Bilateral upper extremity supported, During functional activity Standing balance-Leahy Scale: Poor Standing balance comment: static stance with BUE support and maxA of2                            Cognition Arousal: Alert Behavior During Therapy: WFL for tasks assessed/performed Overall Cognitive Status: No family/caregiver present to determine baseline cognitive functioning                                 General Comments: pt able to follow all simple cues this session, continues to have limited insight to PLOF, answering most questions about use of DME with "I think" or "I guess"  Exercises General Exercises - Lower Extremity Ankle Circles/Pumps: PROM, Both, 10 reps Heel Slides: AAROM, Both, 10 reps, Supine    General Comments General comments (skin integrity, edema, etc.): SpO2 to low of 86% on RA after 15 min. recovered to high 90s with 2L O2      Pertinent Vitals/Pain Pain Assessment Pain Assessment: Faces Faces Pain Scale: Hurts even more Pain Location: LLE with mobility and wt bearing Pain Descriptors / Indicators: Discomfort, Grimacing, Moaning Pain Intervention(s):  Limited activity within patient's tolerance, Monitored during session, Repositioned     PT Goals (current goals can now be found in the care plan section) Acute Rehab PT Goals Patient Stated Goal: to reduce pain PT Goal Formulation: With patient Time For Goal Achievement: 04/06/23 Potential to Achieve Goals: Fair Progress towards PT goals: Progressing toward goals    Frequency    Min 1X/week       Co-evaluation PT/OT/SLP Co-Evaluation/Treatment: Yes Reason for Co-Treatment: Necessary to address cognition/behavior during functional activity;For patient/therapist safety;To address functional/ADL transfers PT goals addressed during session: Mobility/safety with mobility;Balance;Strengthening/ROM;Proper use of DME        AM-PAC PT "6 Clicks" Mobility   Outcome Measure  Help needed turning from your back to your side while in a flat bed without using bedrails?: A Lot Help needed moving from lying on your back to sitting on the side of a flat bed without using bedrails?: A Lot Help needed moving to and from a bed to a chair (including a wheelchair)?: Total Help needed standing up from a chair using your arms (e.g., wheelchair or bedside chair)?: Total Help needed to walk in hospital room?: Total Help needed climbing 3-5 steps with a railing? : Total 6 Click Score: 8    End of Session Equipment Utilized During Treatment: Gait belt Activity Tolerance: Patient tolerated treatment well Patient left: with call bell/phone within reach;in chair;with chair alarm set Nurse Communication: Mobility status;Need for lift equipment (stedy) PT Visit Diagnosis: Other abnormalities of gait and mobility (R26.89);Muscle weakness (generalized) (M62.81);Pain Pain - Right/Left: Left Pain - part of body: Leg;Hip     Time: 6073-7106 PT Time Calculation (min) (ACUTE ONLY): 27 min  Charges:    $Therapeutic Exercise: 8-22 mins PT General Charges $$ ACUTE PT VISIT: 1 Visit                      Vickki Muff, PT, DPT   Acute Rehabilitation Department Office [phone] Secure Chat Communication Preferred   Ronnie Derby 03/26/2023, 12:17 PM

## 2023-03-26 NOTE — Plan of Care (Signed)
  Problem: Education: Goal: Knowledge of General Education information will improve Description: Including pain rating scale, medication(s)/side effects and non-pharmacologic comfort measures Outcome: Progressing   Problem: Health Behavior/Discharge Planning: Goal: Ability to manage health-related needs will improve Outcome: Progressing   Problem: Clinical Measurements: Goal: Ability to maintain clinical measurements within normal limits will improve Outcome: Progressing Goal: Will remain free from infection Outcome: Progressing Goal: Diagnostic test results will improve Outcome: Progressing Goal: Respiratory complications will improve Outcome: Progressing Goal: Cardiovascular complication will be avoided Outcome: Progressing   Problem: Activity: Goal: Risk for activity intolerance will decrease Outcome: Progressing   Problem: Nutrition: Goal: Adequate nutrition will be maintained Outcome: Progressing   Problem: Coping: Goal: Level of anxiety will decrease Outcome: Progressing   Problem: Elimination: Goal: Will not experience complications related to bowel motility Outcome: Progressing Goal: Will not experience complications related to urinary retention Outcome: Progressing   Problem: Pain Managment: Goal: General experience of comfort will improve Outcome: Progressing   Problem: Safety: Goal: Ability to remain free from injury will improve Outcome: Progressing   Problem: Skin Integrity: Goal: Risk for impaired skin integrity will decrease Outcome: Progressing   Problem: Education: Goal: Ability to describe self-care measures that may prevent or decrease complications (Diabetes Survival Skills Education) will improve Outcome: Progressing Goal: Individualized Educational Video(s) Outcome: Progressing   Problem: Coping: Goal: Ability to adjust to condition or change in health will improve Outcome: Progressing   Problem: Fluid Volume: Goal: Ability to  maintain a balanced intake and output will improve Outcome: Progressing   Problem: Health Behavior/Discharge Planning: Goal: Ability to identify and utilize available resources and services will improve Outcome: Progressing Goal: Ability to manage health-related needs will improve Outcome: Progressing   Problem: Metabolic: Goal: Ability to maintain appropriate glucose levels will improve Outcome: Progressing   Problem: Nutritional: Goal: Maintenance of adequate nutrition will improve Outcome: Progressing Goal: Progress toward achieving an optimal weight will improve Outcome: Progressing   Problem: Skin Integrity: Goal: Risk for impaired skin integrity will decrease Outcome: Progressing   Problem: Tissue Perfusion: Goal: Adequacy of tissue perfusion will improve Outcome: Progressing   Problem: Education: Goal: Verbalization of understanding the information provided (i.e., activity precautions, restrictions, etc) will improve Outcome: Progressing Goal: Individualized Educational Video(s) Outcome: Progressing   Problem: Activity: Goal: Ability to ambulate and perform ADLs will improve Outcome: Progressing   Problem: Clinical Measurements: Goal: Postoperative complications will be avoided or minimized Outcome: Progressing   Problem: Self-Concept: Goal: Ability to maintain and perform role responsibilities to the fullest extent possible will improve Outcome: Progressing   Problem: Pain Management: Goal: Pain level will decrease Outcome: Progressing   

## 2023-03-26 NOTE — TOC Initial Note (Signed)
Transition of Care [facility]) - Initial/Assessment Note    Patient Details  Name: Bryan Farrell MRN: [id number] Date of Birth: 1950-11-26  Transition of Care [facility]) CM/SW Contact:    Lorri Frederick, LCSW Phone Number: 03/26/2023, 11:15 AM  Clinical Narrative:   CSW spoke with pt regarding return to [facility].  Pt confirms that he plans to return.  CSW also spoke with daughter [name], who is in agreement with plan to return to [facility]. Pt is LTC resident at [facility], PT recommending STR currently.  Attempt to initiate STR auth, unable to find pt in [place], called [facility], pt not managed by them.  CSW spoke with [facility].  Pt has [employer] eveclear.  [facility] can initiate STR auth after pt returns.  They are able to accept pt back today.  MD informed.                  Expected Discharge Plan: Skilled Nursing Facility Barriers to Discharge: No Barriers Identified   Patient Goals and CMS Choice     Choice offered to / list presented to : Patient, Adult Children (daughter [name])      Expected Discharge Plan and Services In-house Referral: Clinical Social Work   Post Acute Care Choice: Skilled Nursing Facility Living arrangements for the past 2 months: Skilled Nursing Facility                                      Prior Living Arrangements/Services Living arrangements for the past 2 months: Skilled Nursing Facility Lives with:: Facility Resident Patient language and need for interpreter reviewed:: Yes Do you feel safe going back to the place where you live?: Yes      Need for Family Participation in Patient Care: Yes (Comment) Care giver support system in place?: Yes (comment) Current home services: Other (comment) (na) Criminal Activity/Legal Involvement Pertinent to Current Situation/Hospitalization: No - Comment as needed  Activities of Daily Living Home Assistive Devices/Equipment: Wheelchair ADL Screening (condition at time of admission) Patient's cognitive ability adequate to safely  complete daily activities?: No Is the patient deaf or have difficulty hearing?: No Does the patient have difficulty seeing, even when wearing glasses/contacts?: No Does the patient have difficulty concentrating, remembering, or making decisions?: No Patient able to express need for assistance with ADLs?: Yes Does the patient have difficulty dressing or bathing?: Yes Independently performs ADLs?: No Does the patient have difficulty walking or climbing stairs?: Yes Weakness of Legs: Both Weakness of Arms/Hands: None  Permission Sought/Granted                  Emotional Assessment Appearance:: Appears older than stated age Attitude/Demeanor/Rapport: Engaged Affect (typically observed): Pleasant Orientation: : Oriented to Self, Oriented to Place, Oriented to Situation      Admission diagnosis:  Closed left hip fracture (HCC) [S72.002A] Patient Active Problem List   Diagnosis Date Noted   Closed left hip fracture (HCC) 03/21/2023   Malnutrition of moderate degree 04/20/2022   Closed right hip fracture (HCC) 04/18/2022   Type 2 diabetes mellitus with peripheral neuropathy (HCC) 04/18/2022   Depression 04/18/2022   Dyslipidemia 04/18/2022   Closed subcapital fracture of right femur (HCC)    Toe osteomyelitis, right (HCC)    AKI (acute kidney injury) (HCC) 09/02/2021   Hypokalemia 09/02/2021   Influenza A 09/02/2021   Sepsis due to cellulitis (HCC) 08/25/2021   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HCC)  Bacteremia due to Enterococcus 05/29/2019   Toe osteomyelitis, left (HCC)    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)    Mild protein-calorie malnutrition (HCC)    Osteomyelitis (HCC) 05/27/2019   Moderate recurrent major depression (HCC)    Adjustment disorder with depressed mood 04/13/2017   Vitamin B12 deficiency 03/25/2017   Vascular dementia (HCC) 03/23/2017   Suicidal ideation 03/23/2017   Homelessness 03/23/2017   Diabetes (HCC) 03/23/2017   PCP:  Eloisa Northern,  MD Pharmacy:  No Pharmacies Listed    Social Determinants of Health (SDOH) Social History: SDOH Screenings   Food Insecurity: No Food Insecurity (03/21/2023)  Housing: Low Risk  (03/21/2023)  Transportation Needs: No Transportation Needs (03/21/2023)  Utilities: Not At Risk (03/21/2023)  Alcohol Screen: Low Risk  (06/18/2017)  Tobacco Use: Low Risk  (03/21/2023)   SDOH Interventions:     Readmission Risk Interventions     No data to display

## 2023-03-26 NOTE — Progress Notes (Signed)
PROGRESS NOTE    Deckard Gohn  WUJ:[id number] DOB: 05-17-1951 DOA: 03/21/2023 PCP: Eloisa Northern, MD   Brief Narrative:  Bryan Farrell is a 72 y.o. male with PMH significant for diabetes mellitus, vascular dementia, depression, adjustment disorder, wheelchair-bound lives at [facility] health assisted living facility presented in the hospital from Dr. Greig Right office as direct admit for left hip fracture. Hospitalist called for admission, plan for ORIF per [facility] schedule  Assessment & Plan:   Principal Problem:   Closed left hip fracture (HCC) Active Problems:   Adjustment disorder with depressed mood   Type 2 diabetes mellitus with peripheral neuropathy (HCC)   Depression   Dyslipidemia   Vascular dementia (HCC)  Left femoral Neck fracture secondary to mechanical fall, POA: X-ray showed left femoral neck fracture outpatient at ortho office Orthopedics following - Dr Eulah Pont - percutaneous fixation of femoral neck 8/9, tolerated well. PT/OT to follow -patient is reportedly wheelchair-bound at baseline   Hypoxia, unspecified, questionably chronic Rule out PE -Patient carries no complaints of respiratory symptoms, remains hypoxic despite attempted wean on multiple days -Will follow CTA given elevated D-dimer. Despite having no symptoms, tachycardia or provoking event he remains hypoxic at rest. - Concern this is (has been) the patient's baseline for some time but has not been evaluated given lack of symptoms while hypoxic. -Chest x-ray unremarkable for any acute findings, clinical exam equally unremarkable -Will continue to follow along - weaned O2 again 11th with resting sats in the mid/low 80s.  Adjustment disorder with depressed mood: Continue Zoloft 150 mg daily.   Type 2 diabetes: Hold p.o. diabetic medications. Continue Semglee 10 units daily Continue regular insulin sliding scale.   Obtain hemoglobin A1c   Hyperlipidemia: Continue Lipitor   Vascular  dementia: No behavioral problems. Appears at baseline mental status   Peripheral neuropathy Continue gabapentin.  DVT prophylaxis: enoxaparin (LOVENOX) injection 40 mg Start: 03/23/23 1700 SCDs Start: 03/22/23 1613 Code Status:   Code Status: Full Code Family Communication: None present  Status is: Inpt  Dispo: The patient is from: Facility              Anticipated d/c is to: Same              Anticipated d/c date is: medically stable for discharge              Patient currently IS medically stable for discharge  Consultants:  Ortho  Procedures:  Pending above (ORIF planned)  Antimicrobials:  Perioperatively   Subjective: No acute issues/events overnight, pain well controlled  Objective: Vitals:   03/25/23 1233 03/25/23 1450 03/25/23 1952 03/26/23 0407  BP:  122/68 136/69 134/78  Pulse:  66 69 76  Resp:  17 16 16   Temp:  98.2 F (36.8 C) 98.1 F (36.7 C)   TempSrc:  Oral Oral   SpO2: 94% 97% 95% 98%  Weight:      Height:        Intake/Output Summary (Last 24 hours) at 03/26/2023 0800 Last data filed at 03/25/2023 2145 Gross per 24 hour  Intake 320 ml  Output 700 ml  Net -380 ml   Filed Weights   03/21/23 1800 03/22/23 1238  Weight: 88.5 kg 89 kg    Examination:  General exam: Appears calm and comfortable  Respiratory system: Clear to auscultation. Respiratory effort normal. Cardiovascular system: S1 & S2 heard, RRR. No JVD, murmurs, rubs, gallops or clicks. No pedal edema. Gastrointestinal system: Abdomen is nondistended, soft and nontender. No  organomegaly or masses felt. Normal bowel sounds heard. Central nervous system: Alert and oriented to person. No focal neurological deficits. Extremities: LLE rom/strength testing limited by pain; hip bandage clean/dry/intact Skin: No rashes, lesions or ulcers   Data Reviewed: I have personally reviewed following labs and imaging studies  CBC: Recent Labs  Lab 03/21/23 1626 03/22/23 0120 03/23/23 0838  03/25/23 0604  WBC 12.7* 9.2 9.0 6.2  HGB 9.8* 8.9* 8.5* 7.9*  HCT 31.0* 27.5* 25.7* 24.5*  MCV 93.9 92.9 92.1 90.1  PLT 225 224 213 224   Basic Metabolic Panel: Recent Labs  Lab 03/21/23 1626 03/22/23 0120 03/23/23 0838 03/25/23 0604  NA 136 137 134* 135  K 4.5 4.2 4.6 3.7  CL 102 104 101 101  CO2 25 26 27 28   GLUCOSE 170* 184* 126* 98  BUN 24* 24* 20 15  CREATININE 1.37* 1.39* 1.32* 1.14  CALCIUM 8.4* 8.1* 8.2* 8.2*  MG 1.8 1.8  --   --   PHOS 4.1 3.3  --   --    GFR: Estimated Creatinine Clearance: 67.1 mL/min (by C-G formula based on SCr of 1.14 mg/dL).  HbA1C: No results for input(s): "HGBA1C" in the last 72 hours.  CBG: Recent Labs  Lab 03/24/23 1550 03/24/23 2111 03/25/23 0824 03/25/23 1557 03/25/23 2128  GLUCAP 102* 135* 76 102* 117*   Radiology Studies: DG CHEST PORT 1 VIEW  Result Date: 03/24/2023 CLINICAL DATA:  200808 Hypoxia [id number] EXAM: PORTABLE CHEST 1 VIEW COMPARISON:  04/17/2022 chest radiograph. FINDINGS: Stable cardiomediastinal silhouette with normal heart size. No pneumothorax. No pleural effusion. Lungs appear clear, with no acute consolidative airspace disease and no pulmonary edema. IMPRESSION: No active disease. Electronically Signed   By: Delbert Phenix M.D.   On: 03/24/2023 09:40        Scheduled Meds:  atorvastatin  40 mg Oral QHS   divalproex  125 mg Oral TID   docusate sodium  100 mg Oral BID   enoxaparin (LOVENOX) injection  40 mg Subcutaneous Q24H   ferrous sulfate  325 mg Oral Q breakfast   gabapentin  100 mg Oral TID   insulin aspart  0-6 Units Subcutaneous TID WC   insulin glargine-yfgn  10 Units Subcutaneous QHS   traMADol  50 mg Oral TID   Continuous Infusions:  methocarbamol (ROBAXIN) IV       LOS: 5 days   Time spent:  Azucena Fallen, DO [facility]  If 7PM-7AM, please contact night-coverage [email or web address]  03/26/2023, 8:00 AM

## 2023-03-26 NOTE — Discharge Summary (Signed)
Physician Discharge Summary  Bryan Farrell RUE:[id number] DOB: 08-15-1950 DOA: 03/21/2023  PCP: Eloisa Northern, MD  Admit date: 03/21/2023 Discharge date: 03/27/2023  Admitted From: SNF Disposition:  SNF  Recommendations for Outpatient Follow-up:  Follow up with PCP in 1-2 weeks Follow-up with orthopedic surgery as scheduled  Discharge Condition: Stable CODE STATUS: Full Diet recommendation: Low-carb diet as tolerated  Brief/Interim Summary: Bryan Farrell is a 72 y.o. male with PMH significant for diabetes mellitus, vascular dementia, depression, adjustment disorder, wheelchair-bound lives at [facility] health assisted living facility presented in the hospital from Dr. Greig Right office as direct admit for left hip fracture. Hospitalist called for admission, plan for ORIF per Ortho.  Patient admitted as above with left hip fracture status post ORIF on 03/23/2023.  Tolerated procedure quite well.  He is at baseline minimally ambulatory, PT continues to work with patient requiring max assist for standing and pivoting.  Given his improvement in symptoms and postop stability he is otherwise stable and agreeable for discharge back to facility.  **Incidentally noted patient was minimally hypoxic upon arrival with no clear etiology, chest x-ray negative, asymptomatic -denies shortness of breath chest pain dyspnea cough or dysphagia.  Despite multiple attempts to wean oxygen off the patient this was unable to be completed and he continues to require 2 L nasal cannula even at rest.  Patient has likely been borderline hypoxic for quite some time D dimer positive - CTA negative for PE. Stable for discharge.  Discharge Diagnoses:  Principal Problem:   Closed left hip fracture (HCC) Active Problems:   Adjustment disorder with depressed mood   Type 2 diabetes mellitus with peripheral neuropathy (HCC)   Depression   Dyslipidemia   Vascular dementia (HCC)  Left femoral Neck fracture secondary to  mechanical fall, POA: X-ray showed left femoral neck fracture outpatient at ortho office Orthopedics following - Dr Eulah Pont - percutaneous fixation of femoral neck 8/9, tolerated well. PT/OT to follow -patient is reportedly wheelchair-bound at baseline   Hypoxia, unspecified, likely chronic - Patient carries no complaints of respiratory symptoms, remains hypoxic despite attempted wean on multiple days - Concern this is (has been) the patient's baseline for some time but has not been evaluated given lack of symptoms while hypoxic. - Chest x-ray unremarkable for any acute findings, clinical exam equally unremarkable - Will continue to follow along - weaned O2 again 11th with resting sats in the mid/low 80s.   Adjustment disorder with depressed mood: - Continue Zoloft 150 mg daily.   Type 2 diabetes: Hold p.o. diabetic medications. Continue Semglee 10 units daily Continue regular insulin sliding scale.   Obtain hemoglobin A1c   Hyperlipidemia: Continue Lipitor   Vascular dementia: No behavioral problems. Appears at baseline mental status   Peripheral neuropathy Continue gabapentin.  Discharge Instructions   Allergies as of 03/27/2023   No Known Allergies      Medication List     TAKE these medications    acetaminophen 500 MG tablet Commonly known as: TYLENOL Take 500 mg by mouth every 6 (six) hours as needed (Chronic pain).   atorvastatin 40 MG tablet Commonly known as: LIPITOR Take 40 mg by mouth at bedtime.   carboxymethylcellulose 1 % ophthalmic solution Place 1 drop into both eyes 3 (three) times daily.   chlorhexidine 0.12 % solution Commonly known as: PERIDEX 15 mLs by Mouth Rinse route in the morning.   cholecalciferol 25 MCG (1000 UNIT) tablet Commonly known as: VITAMIN D3 Take 1,000 Units by mouth daily.  divalproex 125 MG DR tablet Commonly known as: DEPAKOTE Take 125 mg by mouth 3 (three) times daily.   feeding supplement (GLUCERNA SHAKE)  Liqd Take 237 mLs by mouth 3 (three) times daily after meals.   ferrous sulfate 325 (65 FE) MG tablet Take 1 tablet (325 mg total) by mouth daily with breakfast.   gabapentin 100 MG capsule Commonly known as: Neurontin Take 1 capsule (100 mg total) by mouth 3 (three) times daily.   lidocaine 4 % Place 1 patch onto the skin daily.   multivitamin with minerals tablet Take 1 tablet by mouth daily.   mupirocin ointment 2 % Commonly known as: BACTROBAN Apply 1 Application topically daily. Apply to R lateral thigh topically. Clean abrasion with wound cleanser, apply and calcium alginate cover with bordered gauz   polyethylene glycol powder 17 GM/SCOOP powder Commonly known as: GLYCOLAX/MIRALAX Take 17 g by mouth in the morning and at bedtime.   sennosides-docusate sodium 8.6-50 MG tablet Commonly known as: SENOKOT-S Take 1-2 tablets by mouth See admin instructions. Take 1 tablet by mouth in the morning and 2 tablets at 5 PM daily   sertraline 100 MG tablet Commonly known as: ZOLOFT Take 100 mg by mouth See admin instructions. Take with 50 mg for a total of 150 mg in the morning   sertraline 50 MG tablet Commonly known as: ZOLOFT Take 50 mg by mouth See admin instructions. Take with 100 mg for a total of 150 mg in the morning   Toujeo SoloStar 300 UNIT/ML Solostar Pen Generic drug: insulin glargine (1 Unit Dial) Inject 10 Units into the skin at bedtime.   traMADol 50 MG tablet Commonly known as: ULTRAM Take 50 mg by mouth 3 (three) times daily.        Contact information for follow-up providers     Sheral Apley, MD. Schedule an appointment as soon as possible for a visit in 2 week(s).   Specialty: Orthopedic Surgery Why: after surgery Contact information: [address] [place] [address] [phone]              Contact information for after-discharge care     Destination     [facility] Preferred SNF .   Service:  Skilled Nursing Contact information: [address] [place] [address] [phone]                    No Known Allergies  Consultations: Ortho   Procedures/Studies: CT Angio Chest Pulmonary Embolism (PE) W or WO Contrast  Result Date: 03/26/2023 CLINICAL DATA:  Pulmonary embolus suspected with low to intermediate probability. Positive D-dimer. Hypoxia. EXAM: CT ANGIOGRAPHY CHEST WITH CONTRAST TECHNIQUE: Multidetector CT imaging of the chest was performed using the standard protocol during bolus administration of intravenous contrast. Multiplanar CT image reconstructions and MIPs were obtained to evaluate the vascular anatomy. RADIATION DOSE REDUCTION: This exam was performed according to the departmental dose-optimization program which includes automated exposure control, adjustment of the mA and/or kV according to patient size and/or use of iterative reconstruction technique. CONTRAST:  75mL OMNIPAQUE IOHEXOL 350 MG/ML SOLN COMPARISON:  Chest radiograph 03/24/2023 FINDINGS: Cardiovascular: Good opacification of the central and segmental pulmonary arteries. No focal filling defects. No evidence of significant pulmonary embolus. Normal heart size. No pericardial effusions. Normal caliber thoracic aorta. Calcification of the aorta and coronary arteries. Mediastinum/Nodes: Thyroid gland is unremarkable. Esophagus is decompressed. No significant lymphadenopathy. Lungs/Pleura: Atelectasis in the lung bases. No pleural effusions. No pneumothorax.  Upper Abdomen: No acute abnormalities demonstrated. Musculoskeletal: Degenerative changes. Review of the MIP images confirms the above findings. IMPRESSION: 1. No evidence of significant pulmonary embolus. 2. Atelectasis in the lung bases. Electronically Signed   By: Burman Nieves M.D.   On: 03/26/2023 22:19   DG CHEST PORT 1 VIEW  Result Date: 03/24/2023 CLINICAL DATA:  200808 Hypoxia [id number] EXAM: PORTABLE CHEST 1 VIEW COMPARISON:   04/17/2022 chest radiograph. FINDINGS: Stable cardiomediastinal silhouette with normal heart size. No pneumothorax. No pleural effusion. Lungs appear clear, with no acute consolidative airspace disease and no pulmonary edema. IMPRESSION: No active disease. Electronically Signed   By: Delbert Phenix M.D.   On: 03/24/2023 09:40   DG HIP UNILAT WITH PELVIS 2-3 VIEWS LEFT  Result Date: 03/22/2023 CLINICAL DATA:  Left hip pinning. EXAM: DG HIP (WITH OR WITHOUT PELVIS) 2-3V LEFT COMPARISON:  Pelvis and left hip radiographs 03/21/2023 FINDINGS: Images were performed intraoperatively without the presence of a radiologist. Interval placement of 3 cannulated screws fixating the previously seen proximal left femoral fracture. No hardware complication is seen. Total fluoroscopy images: 2 Total fluoroscopy time: 51 seconds Total dose: Radiation Exposure Index (as provided by the fluoroscopic device): 14.18 mGy air Kerma Please see intraoperative findings for further detail. IMPRESSION: Intraoperative fluoroscopic guidance for left hip pinning. Electronically Signed   By: Neita Garnet M.D.   On: 03/22/2023 16:14   Pelvis Portable  Result Date: 03/22/2023 CLINICAL DATA:  Hip fracture EXAM: PORTABLE PELVIS 1-2 VIEWS COMPARISON:  03/21/2023 FINDINGS: Three cannulated screws span the LEFT femoral neck. No complication. RIGHT total hip arthroplasty. IMPRESSION: yesLEFT femoral neck fixation. Electronically Signed   By: Genevive Bi M.D.   On: 03/22/2023 16:04   DG C-Arm 1-60 Min-No Report  Result Date: 03/22/2023 Fluoroscopy was utilized by the requesting physician.  No radiographic interpretation.   DG HIP UNILAT WITH PELVIS 2-3 VIEWS LEFT  Result Date: 03/21/2023 CLINICAL DATA:  Closed left hip fracture. EXAM: DG HIP (WITH OR WITHOUT PELVIS) 2-3V LEFT COMPARISON:  Pelvis and right hip radiograph 04/27/2022 FINDINGS: Nondisplaced left femoral neck fracture. Femoral head is seated in the acetabulum. Intact bony pelvis,  no rami fracture. Right hip arthroplasty is intact were visualized. Pubic symphysis and sacroiliac joints are congruent. IMPRESSION: Nondisplaced left femoral neck fracture. Electronically Signed   By: Narda Rutherford M.D.   On: 03/21/2023 19:51     Subjective: No acute issues/events overnight   Discharge Exam: Vitals:   03/27/23 0526 03/27/23 0758  BP: (!) 151/79 125/75  Pulse: 68 75  Resp: 18 17  Temp: 98 F (36.7 C) 98.6 F (37 C)  SpO2: 98% 97%   Vitals:   03/26/23 0900 03/26/23 1945 03/27/23 0526 03/27/23 0758  BP: 139/74 114/68 (!) 151/79 125/75  Pulse: 66 75 68 75  Resp: 16 20 18 17   Temp: 98.1 F (36.7 C) 98.4 F (36.9 C) 98 F (36.7 C) 98.6 F (37 C)  TempSrc: Oral Oral  Oral  SpO2: 96% 96% 98% 97%  Weight:      Height:        General: Pt is alert, awake, not in acute distress Cardiovascular: RRR, S1/S2 +, no rubs, no gallops Respiratory: CTA bilaterally, no wheezing, no rhonchi Abdominal: Soft, NT, ND, bowel sounds + Extremities: no edema, no cyanosis    The results of significant diagnostics from this hospitalization (including imaging, microbiology, ancillary and laboratory) are listed below for reference.     Microbiology: No results found for this or  any previous visit (from the past 240 hour(s)).   Labs: BNP (last 3 results) No results for input(s): "BNP" in the last 8760 hours. Basic Metabolic Panel: Recent Labs  Lab 03/21/23 1626 03/22/23 0120 03/23/23 0838 03/25/23 0604  NA 136 137 134* 135  K 4.5 4.2 4.6 3.7  CL 102 104 101 101  CO2 25 26 27 28   GLUCOSE 170* 184* 126* 98  BUN 24* 24* 20 15  CREATININE 1.37* 1.39* 1.32* 1.14  CALCIUM 8.4* 8.1* 8.2* 8.2*  MG 1.8 1.8  --   --   PHOS 4.1 3.3  --   --    Liver Function Tests: No results for input(s): "AST", "ALT", "ALKPHOS", "BILITOT", "PROT", "ALBUMIN" in the last 168 hours. No results for input(s): "LIPASE", "AMYLASE" in the last 168 hours. No results for input(s): "AMMONIA" in  the last 168 hours. CBC: Recent Labs  Lab 03/21/23 1626 03/22/23 0120 03/23/23 0838 03/25/23 0604  WBC 12.7* 9.2 9.0 6.2  HGB 9.8* 8.9* 8.5* 7.9*  HCT 31.0* 27.5* 25.7* 24.5*  MCV 93.9 92.9 92.1 90.1  PLT 225 224 213 224   Cardiac Enzymes: No results for input(s): "CKTOTAL", "CKMB", "CKMBINDEX", "TROPONINI" in the last 168 hours. BNP: Invalid input(s): "POCBNP" CBG: Recent Labs  Lab 03/26/23 0931 03/26/23 1144 03/26/23 1611 03/26/23 2031 03/27/23 0801  GLUCAP 135* 110* 133* 122* 81   D-Dimer Recent Labs    03/26/23 1222  DDIMER 2.86*   Hgb A1c No results for input(s): "HGBA1C" in the last 72 hours. Lipid Profile No results for input(s): "CHOL", "HDL", "LDLCALC", "TRIG", "CHOLHDL", "LDLDIRECT" in the last 72 hours. Thyroid function studies No results for input(s): "TSH", "T4TOTAL", "T3FREE", "THYROIDAB" in the last 72 hours.  Invalid input(s): "FREET3" Anemia work up No results for input(s): "VITAMINB12", "FOLATE", "FERRITIN", "TIBC", "IRON", "RETICCTPCT" in the last 72 hours. Urinalysis    Component Value Date/Time   COLORURINE AMBER (A) 08/25/2021 1833   APPEARANCEUR HAZY (A) 08/25/2021 1833   LABSPEC 1.024 08/25/2021 1833   PHURINE 5.0 08/25/2021 1833   GLUCOSEU >=500 (A) 08/25/2021 1833   HGBUR MODERATE (A) 08/25/2021 1833   BILIRUBINUR NEGATIVE 08/25/2021 1833   KETONESUR 5 (A) 08/25/2021 1833   PROTEINUR >=300 (A) 08/25/2021 1833   UROBILINOGEN 1.0 04/20/2010 1105   NITRITE NEGATIVE 08/25/2021 1833   LEUKOCYTESUR NEGATIVE 08/25/2021 1833   Sepsis Labs Recent Labs  Lab 03/21/23 1626 03/22/23 0120 03/23/23 0838 03/25/23 0604  WBC 12.7* 9.2 9.0 6.2   Microbiology No results found for this or any previous visit (from the past 240 hour(s)).   Time coordinating discharge: Over 30 minutes  SIGNED:   Azucena Fallen, DO [facility] 03/27/2023, 8:32 AM Pager   If 7PM-7AM, please contact night-coverage [email or web address]

## 2023-03-26 NOTE — Plan of Care (Signed)
Problem: Education: Goal: Knowledge of General Education information will improve Description: Including pain rating scale, medication(s)/side effects and non-pharmacologic comfort measures 03/26/2023 2201 by Hortencia Pilar, RN Outcome: Progressing 03/26/2023 2200 by Hortencia Pilar, RN Outcome: Progressing   Problem: Health Behavior/Discharge Planning: Goal: Ability to manage health-related needs will improve 03/26/2023 2201 by Hortencia Pilar, RN Outcome: Progressing 03/26/2023 2200 by Hortencia Pilar, RN Outcome: Progressing   Problem: Clinical Measurements: Goal: Ability to maintain clinical measurements within normal limits will improve 03/26/2023 2201 by Hortencia Pilar, RN Outcome: Progressing 03/26/2023 2200 by Hortencia Pilar, RN Outcome: Progressing Goal: Will remain free from infection 03/26/2023 2201 by Hortencia Pilar, RN Outcome: Progressing 03/26/2023 2200 by Hortencia Pilar, RN Outcome: Progressing Goal: Diagnostic test results will improve 03/26/2023 2201 by Hortencia Pilar, RN Outcome: Progressing 03/26/2023 2200 by Hortencia Pilar, RN Outcome: Progressing Goal: Respiratory complications will improve 03/26/2023 2201 by Hortencia Pilar, RN Outcome: Progressing 03/26/2023 2200 by Hortencia Pilar, RN Outcome: Progressing Goal: Cardiovascular complication will be avoided 03/26/2023 2201 by Hortencia Pilar, RN Outcome: Progressing 03/26/2023 2200 by Hortencia Pilar, RN Outcome: Progressing   Problem: Activity: Goal: Risk for activity intolerance will decrease 03/26/2023 2201 by Hortencia Pilar, RN Outcome: Progressing 03/26/2023 2200 by Hortencia Pilar, RN Outcome: Progressing   Problem: Nutrition: Goal: Adequate nutrition will be maintained 03/26/2023 2201 by Hortencia Pilar, RN Outcome: Progressing 03/26/2023 2200 by Hortencia Pilar, RN Outcome: Progressing   Problem: Coping: Goal: Level of anxiety will decrease 03/26/2023 2201 by Hortencia Pilar, RN Outcome: Progressing 03/26/2023 2200 by Hortencia Pilar, RN Outcome: Progressing   Problem: Elimination: Goal: Will not experience complications related to bowel motility 03/26/2023 2201 by Hortencia Pilar, RN Outcome: Progressing 03/26/2023 2200 by Hortencia Pilar, RN Outcome: Progressing Goal: Will not experience complications related to urinary retention 03/26/2023 2201 by Hortencia Pilar, RN Outcome: Progressing 03/26/2023 2200 by Hortencia Pilar, RN Outcome: Progressing   Problem: Pain Managment: Goal: General experience of comfort will improve 03/26/2023 2201 by Hortencia Pilar, RN Outcome: Progressing 03/26/2023 2200 by Hortencia Pilar, RN Outcome: Progressing   Problem: Safety: Goal: Ability to remain free from injury will improve 03/26/2023 2201 by Hortencia Pilar, RN Outcome: Progressing 03/26/2023 2200 by Hortencia Pilar, RN Outcome: Progressing   Problem: Skin Integrity: Goal: Risk for impaired skin integrity will decrease 03/26/2023 2201 by Hortencia Pilar, RN Outcome: Progressing 03/26/2023 2200 by Hortencia Pilar, RN Outcome: Progressing   Problem: Education: Goal: Ability to describe self-care measures that may prevent or decrease complications (Diabetes Survival Skills Education) will improve 03/26/2023 2201 by Hortencia Pilar, RN Outcome: Progressing 03/26/2023 2200 by Hortencia Pilar, RN Outcome: Progressing Goal: Individualized Educational Video(s) 03/26/2023 2201 by Hortencia Pilar, RN Outcome: Progressing 03/26/2023 2200 by Hortencia Pilar, RN Outcome: Progressing   Problem: Coping: Goal: Ability to adjust to condition or change in health will improve 03/26/2023 2201 by Hortencia Pilar, RN Outcome: Progressing 03/26/2023 2200 by Hortencia Pilar, RN Outcome: Progressing   Problem: Fluid Volume: Goal: Ability to maintain a balanced intake and output will improve 03/26/2023 2201 by Hortencia Pilar, RN Outcome:  Progressing 03/26/2023 2200 by Hortencia Pilar, RN Outcome: Progressing   Problem: Health Behavior/Discharge Planning: Goal: Ability to identify and utilize available resources and services will improve 03/26/2023 2201 by Hortencia Pilar, RN Outcome: Progressing 03/26/2023 2200 by Hortencia Pilar,  RN Outcome: Progressing Goal: Ability to manage health-related needs will improve 03/26/2023 2201 by Hortencia Pilar, RN Outcome: Progressing 03/26/2023 2200 by Hortencia Pilar, RN Outcome: Progressing   Problem: Metabolic: Goal: Ability to maintain appropriate glucose levels will improve 03/26/2023 2201 by Hortencia Pilar, RN Outcome: Progressing 03/26/2023 2200 by Hortencia Pilar, RN Outcome: Progressing   Problem: Nutritional: Goal: Maintenance of adequate nutrition will improve 03/26/2023 2201 by Hortencia Pilar, RN Outcome: Progressing 03/26/2023 2200 by Hortencia Pilar, RN Outcome: Progressing Goal: Progress toward achieving an optimal weight will improve 03/26/2023 2201 by Hortencia Pilar, RN Outcome: Progressing 03/26/2023 2200 by Hortencia Pilar, RN Outcome: Progressing   Problem: Skin Integrity: Goal: Risk for impaired skin integrity will decrease 03/26/2023 2201 by Hortencia Pilar, RN Outcome: Progressing 03/26/2023 2200 by Hortencia Pilar, RN Outcome: Progressing   Problem: Tissue Perfusion: Goal: Adequacy of tissue perfusion will improve 03/26/2023 2201 by Hortencia Pilar, RN Outcome: Progressing 03/26/2023 2200 by Hortencia Pilar, RN Outcome: Progressing   Problem: Education: Goal: Verbalization of understanding the information provided (i.e., activity precautions, restrictions, etc) will improve 03/26/2023 2201 by Hortencia Pilar, RN Outcome: Progressing 03/26/2023 2200 by Hortencia Pilar, RN Outcome: Progressing Goal: Individualized Educational Video(s) 03/26/2023 2201 by Hortencia Pilar, RN Outcome: Progressing 03/26/2023 2200 by Hortencia Pilar,  RN Outcome: Progressing   Problem: Activity: Goal: Ability to ambulate and perform ADLs will improve 03/26/2023 2201 by Hortencia Pilar, RN Outcome: Progressing 03/26/2023 2200 by Hortencia Pilar, RN Outcome: Progressing   Problem: Clinical Measurements: Goal: Postoperative complications will be avoided or minimized 03/26/2023 2201 by Hortencia Pilar, RN Outcome: Progressing 03/26/2023 2200 by Hortencia Pilar, RN Outcome: Progressing   Problem: Self-Concept: Goal: Ability to maintain and perform role responsibilities to the fullest extent possible will improve 03/26/2023 2201 by Hortencia Pilar, RN Outcome: Progressing 03/26/2023 2200 by Hortencia Pilar, RN Outcome: Progressing   Problem: Pain Management: Goal: Pain level will decrease 03/26/2023 2201 by Hortencia Pilar, RN Outcome: Progressing 03/26/2023 2200 by Hortencia Pilar, RN Outcome: Progressing

## 2023-03-27 DIAGNOSIS — S72002D Fracture of unspecified part of neck of left femur, subsequent encounter for closed fracture with routine healing: Secondary | ICD-10-CM | POA: Diagnosis not present

## 2023-03-27 DIAGNOSIS — E1142 Type 2 diabetes mellitus with diabetic polyneuropathy: Secondary | ICD-10-CM | POA: Diagnosis not present

## 2023-03-27 DIAGNOSIS — F32A Depression, unspecified: Secondary | ICD-10-CM | POA: Diagnosis not present

## 2023-03-27 DIAGNOSIS — F4321 Adjustment disorder with depressed mood: Secondary | ICD-10-CM | POA: Diagnosis not present

## 2023-03-27 LAB — GLUCOSE, CAPILLARY
Glucose-Capillary: 81 mg/dL (ref 70–99)
Glucose-Capillary: 88 mg/dL (ref 70–99)

## 2023-03-27 NOTE — TOC Transition Note (Signed)
Transition of Care [facility]) - CM/SW Discharge Note   Patient Details  Name: Jarmar Reusch MRN: [id number] Date of Birth: 1951-01-15  Transition of [facility] CM/SW Contact:  Lorri Frederick, LCSW Phone Number: 03/27/2023, 10:44 AM   Clinical Narrative:   Pt discharging to [facility].  RN call report to [phone].     Final next level of care: Skilled Nursing Facility Barriers to Discharge: Barriers Resolved   Patient Goals and CMS Choice   Choice offered to / list presented to : Patient, Adult Children (daughter [name])  Discharge Placement                Patient chooses bed at: [facility] Patient to be transferred to facility by: [facility] Name of family member notified: daughter [name] Patient and family notified of of transfer: 03/27/23  Discharge Plan and Services Additional resources added to the After Visit Summary for   In-house Referral: Clinical Social Work   Post Acute Care Choice: Skilled Nursing Facility                               Social Determinants of Health (SDOH) Interventions SDOH Screenings   Food Insecurity: No Food Insecurity (03/21/2023)  Housing: Low Risk  (03/21/2023)  Transportation Needs: No Transportation Needs (03/21/2023)  Utilities: Not At Risk (03/21/2023)  Alcohol Screen: Low Risk  (06/18/2017)  Tobacco Use: Low Risk  (03/21/2023)     Readmission Risk Interventions     No data to display

## 2023-03-27 NOTE — Progress Notes (Signed)
Pt discharged by stretcher via [place] to [facility]. Report given to RN.

## 2023-04-04 ENCOUNTER — Encounter (HOSPITAL_COMMUNITY): Payer: Self-pay | Admitting: Orthopedic Surgery

## 2024-08-26 NOTE — Progress Notes (Shared)
 " [facility] - Clinic Note  09/09/2024   CHIEF COMPLAINT Patient presents for No chief complaint on file.  HISTORY OF PRESENT ILLNESS: Bryan Farrell is a 74 y.o. male who presents to the clinic today for:   Referring physician: No referring provider defined for this encounter.  HISTORICAL INFORMATION:  Selected notes from the MEDICAL RECORD NUMBER Referred by Dr. JAMA:  Ocular Hx- PMH-   CURRENT MEDICATIONS: Current Outpatient Medications (Ophthalmic Drugs)  Medication Sig   carboxymethylcellulose 1 % ophthalmic solution Place 1 drop into both eyes 3 (three) times daily.   No current facility-administered medications for this visit. (Ophthalmic Drugs)   Current Outpatient Medications (Other)  Medication Sig   acetaminophen  (TYLENOL ) 500 MG tablet Take 500 mg by mouth every 6 (six) hours as needed (Chronic pain).   atorvastatin  (LIPITOR) 40 MG tablet Take 40 mg by mouth at bedtime.   chlorhexidine  (PERIDEX ) 0.12 % solution 15 mLs by Mouth Rinse route in the morning.   cholecalciferol  (VITAMIN D ) 25 MCG (1000 UNIT) tablet Take 1,000 Units by mouth daily.   divalproex  (DEPAKOTE ) 125 MG DR tablet Take 125 mg by mouth 3 (three) times daily.   feeding supplement, GLUCERNA SHAKE, (GLUCERNA SHAKE) LIQD Take 237 mLs by mouth 3 (three) times daily after meals.   ferrous sulfate  325 (65 FE) MG tablet Take 1 tablet (325 mg total) by mouth daily with breakfast.   gabapentin  (NEURONTIN ) 100 MG capsule Take 1 capsule (100 mg total) by mouth 3 (three) times daily.   lidocaine  4 % Place 1 patch onto the skin daily.   Multiple Vitamins-Minerals (MULTIVITAMIN WITH MINERALS) tablet Take 1 tablet by mouth daily.   mupirocin  ointment (BACTROBAN ) 2 % Apply 1 Application topically daily. Apply to R lateral thigh topically. Clean abrasion with wound cleanser, apply and calcium  alginate cover with bordered gauz   polyethylene glycol powder (GLYCOLAX /MIRALAX ) 17 GM/SCOOP powder  Take 17 g by mouth in the morning and at bedtime.   sennosides-docusate sodium  (SENOKOT-S) 8.6-50 MG tablet Take 1-2 tablets by mouth See admin instructions. Take 1 tablet by mouth in the morning and 2 tablets at 5 PM daily   sertraline  (ZOLOFT ) 100 MG tablet Take 100 mg by mouth See admin instructions. Take with 50 mg for a total of 150 mg in the morning   sertraline  (ZOLOFT ) 50 MG tablet Take 50 mg by mouth See admin instructions. Take with 100 mg for a total of 150 mg in the morning   TOUJEO  SOLOSTAR 300 UNIT/ML Solostar Pen Inject 10 Units into the skin at bedtime.   traMADol  (ULTRAM ) 50 MG tablet Take 50 mg by mouth 3 (three) times daily.   No current facility-administered medications for this visit. 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   Hypertension    Past Surgical History:  Procedure Laterality Date   AMPUTATION Left 05/30/2019   Procedure: LEFT FOOT 2ND RAY AMPUTATION;  Surgeon: Harden Jerona GAILS, MD;  Location: [facility] OR;  Service: Orthopedics;  Laterality: Left;   AMPUTATION Right 12/28/2021   Procedure: RIGHT 2ND TOE AMPUTATION;  Surgeon: Harden Jerona GAILS, MD;  Location: [facility] OR;  Service: Orthopedics;  Laterality: Right;   HIP PINNING,CANNULATED Left 03/22/2023   Procedure: PERCUTANEOUS FIXATION OF FEMORAL NECK;  Surgeon: Beverley Evalene BIRCH, MD;  Location: [facility]  OR;  Service: Orthopedics;  Laterality: Left;   TOE AMPUTATION Right    TONSILLECTOMY     TOTAL HIP ARTHROPLASTY Right 04/20/2022   Procedure: TOTAL HIP ARTHROPLASTY ANTERIOR APPROACH;  Surgeon: Fidel Rogue, MD;  Location: [facility] ORS;  Service: Orthopedics;  Laterality: Right;   FAMILY HISTORY Family History  Problem Relation Age of Onset   Heart Problems Mother    Heart Problems Father    SOCIAL HISTORY Social History[2]     OPHTHALMIC EXAM:  Not  recorded    IMAGING AND PROCEDURES  Imaging and Procedures for 09/09/2024        ASSESSMENT/PLAN: No diagnosis found. 1.  2.  3.  Ophthalmic Meds Ordered this visit:  No orders of the defined types were placed in this encounter.    No follow-ups on file.  There are no Patient Instructions on file for this visit.  Explained the diagnoses, plan, and follow up with the patient and they expressed understanding.  Patient expressed understanding of the importance of proper follow up care.   This document serves as a record of services personally performed by Rogue JUDITHANN Hans, MD, PhD. It was created on their behalf by Wanda GEANNIE Keens, COT an ophthalmic technician. The creation of this record is the provider's dictation and/or activities during the visit.    Electronically signed by:  Wanda GEANNIE Keens, COT  08/26/2024 7:32 AM   [place] JUDITHANN Hans, M.D., Ph.D. Diseases & Surgery of the Retina and Vitreous [facility] 09/09/2024  Abbreviations: M myopia (nearsighted); A astigmatism; H hyperopia (farsighted); P presbyopia; Mrx spectacle prescription;  CTL contact lenses; OD right eye; OS left eye; OU both eyes  XT exotropia; ET esotropia; PEK punctate epithelial keratitis; PEE punctate epithelial erosions; DES dry eye syndrome; MGD meibomian gland dysfunction; ATs artificial tears; PFAT's preservative free artificial tears; NSC nuclear sclerotic cataract; PSC posterior subcapsular cataract; ERM epi-retinal membrane; PVD posterior vitreous detachment; RD retinal detachment; DM diabetes mellitus; DR diabetic retinopathy; NPDR non-proliferative diabetic retinopathy; PDR proliferative diabetic retinopathy; CSME clinically significant macular edema; DME diabetic macular edema; dbh dot blot hemorrhages; CWS cotton wool spot; POAG primary open angle glaucoma; C/D cup-to-disc ratio; HVF humphrey visual field; GVF goldmann visual field; OCT optical coherence tomography; IOP  intraocular pressure; BRVO Branch retinal vein occlusion; CRVO central retinal vein occlusion; CRAO central retinal artery occlusion; BRAO branch retinal artery occlusion; RT retinal tear; SB scleral buckle; PPV pars plana vitrectomy; VH Vitreous hemorrhage; PRP panretinal laser photocoagulation; IVK intravitreal kenalog; VMT vitreomacular traction; MH Macular hole;  NVD neovascularization of the disc; NVE neovascularization elsewhere; AREDS age related eye disease study; ARMD age related macular degeneration; POAG primary open angle glaucoma; EBMD epithelial/anterior basement membrane dystrophy; ACIOL anterior chamber intraocular lens; IOL intraocular lens; PCIOL posterior chamber intraocular lens; Phaco/IOL phacoemulsification with intraocular lens placement; PRK photorefractive keratectomy; LASIK laser assisted in situ keratomileusis; HTN hypertension; DM diabetes mellitus; COPD chronic obstructive pulmonary disease     [1] No Known Allergies [2]  Social History Tobacco Use   Smoking status: Never   Smokeless tobacco: Never  Vaping Use   Vaping status: Never Used  Substance Use Topics   Alcohol  use: Not Currently   Drug use: No   "

## 2024-09-09 ENCOUNTER — Encounter (INDEPENDENT_AMBULATORY_CARE_PROVIDER_SITE_OTHER): Admitting: Ophthalmology

## 2024-09-09 DIAGNOSIS — H3581 Retinal edema: Secondary | ICD-10-CM

## 2024-09-09 NOTE — Progress Notes (Signed)
 " [facility] - Clinic Note  09/17/2024   CHIEF COMPLAINT Patient presents for Retina Evaluation  HISTORY OF PRESENT ILLNESS: Bryan Farrell is a 74 y.o. male who presents to the clinic today for:  HPI     Retina Evaluation   In both eyes.  I, the attending physician,  performed the HPI with the patient and updated documentation appropriately.        Comments   Pt is here for DM exam. Pt denies VA changes. Pt denies FOL/floaters.  BSL: Unknown  A1c: Unknown       Last edited by Valdemar Rogue, MD on 09/17/2024  1:27 PM.       Referring physician: No referring provider defined for this encounter.  HISTORICAL INFORMATION:  Selected notes from the MEDICAL RECORD NUMBER Referred by [facility] following Optometry consult by Wanda Jackson, OD on 10.30.25 LEE:  Ocular Hx- PMH-   CURRENT MEDICATIONS: Current Outpatient Medications (Ophthalmic Drugs)  Medication Sig   carboxymethylcellulose 1 % ophthalmic solution Place 1 drop into both eyes 3 (three) times daily.   No current facility-administered medications for this visit. (Ophthalmic Drugs)   Current Outpatient Medications (Other)  Medication Sig   acetaminophen  (TYLENOL ) 500 MG tablet Take 500 mg by mouth every 6 (six) hours as needed (Chronic pain).   atorvastatin  (LIPITOR) 40 MG tablet Take 40 mg by mouth at bedtime.   chlorhexidine  (PERIDEX ) 0.12 % solution 15 mLs by Mouth Rinse route in the morning.   cholecalciferol  (VITAMIN D ) 25 MCG (1000 UNIT) tablet Take 1,000 Units by mouth daily.   divalproex  (DEPAKOTE ) 125 MG DR tablet Take 125 mg by mouth 3 (three) times daily.   feeding supplement, GLUCERNA SHAKE, (GLUCERNA SHAKE) LIQD Take 237 mLs by mouth 3 (three) times daily after meals.   ferrous sulfate  325 (65 FE) MG tablet Take 1 tablet (325 mg total) by mouth daily with breakfast.   gabapentin  (NEURONTIN ) 100 MG capsule Take 1 capsule (100 mg total) by mouth 3 (three) times  daily.   lidocaine  4 % Place 1 patch onto the skin daily.   Multiple Vitamins-Minerals (MULTIVITAMIN WITH MINERALS) tablet Take 1 tablet by mouth daily.   mupirocin  ointment (BACTROBAN ) 2 % Apply 1 Application topically daily. Apply to R lateral thigh topically. Clean abrasion with wound cleanser, apply and calcium  alginate cover with bordered gauz   polyethylene glycol powder (GLYCOLAX /MIRALAX ) 17 GM/SCOOP powder Take 17 g by mouth in the morning and at bedtime.   sennosides-docusate sodium  (SENOKOT-S) 8.6-50 MG tablet Take 1-2 tablets by mouth See admin instructions. Take 1 tablet by mouth in the morning and 2 tablets at 5 PM daily   sertraline  (ZOLOFT ) 100 MG tablet Take 100 mg by mouth See admin instructions. Take with 50 mg for a total of 150 mg in the morning   sertraline  (ZOLOFT ) 50 MG tablet Take 50 mg by mouth See admin instructions. Take with 100 mg for a total of 150 mg in the morning   TOUJEO  SOLOSTAR 300 UNIT/ML Solostar Pen Inject 10 Units into the skin at bedtime.   traMADol  (ULTRAM ) 50 MG tablet Take 50 mg by mouth 3 (three) times daily.   No current facility-administered medications for this visit. 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   Hypertension  Past Surgical History:  Procedure Laterality Date   AMPUTATION Left 05/30/2019   Procedure: LEFT FOOT 2ND RAY AMPUTATION;  Surgeon: Harden Jerona GAILS, MD;  Location: [facility] OR;  Service: Orthopedics;  Laterality: Left;   AMPUTATION Right 12/28/2021   Procedure: RIGHT 2ND TOE AMPUTATION;  Surgeon: Harden Jerona GAILS, MD;  Location: [facility] OR;  Service: Orthopedics;  Laterality: Right;   HIP PINNING,CANNULATED Left 03/22/2023   Procedure: PERCUTANEOUS FIXATION OF FEMORAL NECK;  Surgeon: Beverley Evalene BIRCH, MD;  Location: [facility] OR;  Service: Orthopedics;  Laterality: Left;    TOE AMPUTATION Right    TONSILLECTOMY     TOTAL HIP ARTHROPLASTY Right 04/20/2022   Procedure: TOTAL HIP ARTHROPLASTY ANTERIOR APPROACH;  Surgeon: Fidel Rogue, MD;  Location: [facility] ORS;  Service: Orthopedics;  Laterality: Right;   FAMILY HISTORY Family History  Problem Relation Age of Onset   Heart Problems Mother    Heart Problems Father    SOCIAL HISTORY Social History[2]     OPHTHALMIC EXAM:  Base Eye Exam     Visual Acuity (Snellen - Linear)       Right Left   Dist [place] 20/250 -2 20/60 -2   Dist ph [place] NI NI  Pt can only see the right side of VA chart OD.         Tonometry (Tonopen, 9:07 AM)       Right Left   Pressure 17 14         Pupils       Pupils Dark Light Shape React APD   Right PERRL 2 1 Round Minimal None   Left PERRL 2 1 Round Minimal None         Visual Fields       Left Right    Full Full         Extraocular Movement       Right Left    Full, Ortho Full, Ortho         Neuro/Psych     Oriented x3: Yes         Dilation     Both eyes: 1.0% Mydriacyl, 2.5% Phenylephrine  @ 9:09 AM           Slit Lamp and Fundus Exam     External Exam       Right Left   External Normal Normal         Slit Lamp Exam       Right Left   Lids/Lashes Dermatochalasis - upper lid Dermatochalasis - upper lid   Conjunctiva/Sclera White and quiet White and quiet   Cornea Punctate epithelial erosions Punctate epithelial erosions   Anterior Chamber Moderate depth, narrow temporal angle Moderate depth, narrow temporal angle   Iris Round and dilated, No NVI Round and dilated, No NVI   Lens 2-3+ Nuclear sclerosis with brunescence, 2+ Cortical cataract 2-3+ Nuclear sclerosis with brunescence, 2+ Cortical cataract   Anterior Vitreous Vitreous syneresis Vitreous syneresis         Fundus Exam       Right Left   Disc Mild pallor, sharp rim, cupping Mild pallor, sharp rim, early find NVD   C/D Ratio 0.7 0.6   Macula Flat, Blunted foveal  reflex, central edema with + DBH and exudates Flat, Blunted foveal reflex, focal DBH/exudates and edema superior macula   Vessels Vascular attenuation, Tortuous, focal NVE mid zone Vascular attenuation, Tortuous, + focal NVE superior mid zone   Periphery Scattered MA/DBH greatest posteriorly Scattered DBH greatest posteriorly,  mild focal linear pre retinal heme inferior mid zone           Refraction     Manifest Refraction (Auto)       Sphere Cylinder Axis Dist VA   Right       Left +1.50 +1.00 075 20/60  Unable to get AR reading OD due to CAT          IMAGING AND PROCEDURES  Imaging and Procedures for 09/17/2024  OCT, Retina - OU - Both Eyes       Right Eye Quality was good. Central Foveal Thickness: 658. Progression has no prior data. Findings include no SRF, abnormal foveal contour, intraretinal hyper-reflective material, vitreomacular adhesion (Severe central edema with IRF/IRHM).   Left Eye Quality was good. Central Foveal Thickness: 358. Progression has no prior data. Findings include no SRF, abnormal foveal contour, intraretinal hyper-reflective material, epiretinal membrane, vitreous traction, vitreomacular adhesion (Scattered edema with IRF/IRHM-- greatest temporal fovea, focal Tractional edema superior mac).   Notes *Images captured and stored on drive  Diagnosis / Impression:  OD: Severe central edema with IRF/IRHM OS: Scattered edema with IRF/IRHM-- greatest temporal fovea, focal Tractional edema superior mac  Clinical management:  See below  Abbreviations: NFP - Normal foveal profile. CME - cystoid macular edema. PED - pigment epithelial detachment. IRF - intraretinal fluid. SRF - subretinal fluid. EZ - ellipsoid zone. ERM - epiretinal membrane. ORA - outer retinal atrophy. ORT - outer retinal tubulation. SRHM - subretinal hyper-reflective material. IRHM - intraretinal hyper-reflective material      Fluorescein Angiography Optos (Transit OD)       Right  Eye Progression has no prior data. Early phase findings include microaneurysm, retinal neovascularization, vascular perfusion defect. Mid/Late phase findings include leakage, microaneurysm, retinal neovascularization, vascular perfusion defect (Scattered patches of vascular non profusion, focal NV superior mid zone, scattered late leaking MA greatest central macula).   Left Eye Progression has no prior data. Early phase findings include leakage, microaneurysm, neovascularization disc, retinal neovascularization, vascular perfusion defect. Mid/Late phase findings include leakage, microaneurysm, neovascularization disc, retinal neovascularization, vascular perfusion defect (Scattered patches of vascular non profusion,scattered mid zonal NV greatest superonasal to disc, +NV, scattered late leaking MA ).   Notes *Images captured and stored on drive  Diagnosis / Impression:  OD: Scattered patches of vascular non perfusion, focal NV superior mid zone, scattered late leaking MA greatest central macula OS: Scattered patches of vascular non perfusion, scattered mid zonal NV greatest superonasal to disc, +NV,  scattered late leaking MA  Clinical management:  See below  Abbreviations: NFP - Normal foveal profile. CME - cystoid macular edema. PED - pigment epithelial detachment. IRF - intraretinal fluid. SRF - subretinal fluid. EZ - ellipsoid zone. ERM - epiretinal membrane. ORA - outer retinal atrophy. ORT - outer retinal tubulation. SRHM - subretinal hyper-reflective material. IRHM - intraretinal hyper-reflective material      Intravitreal Injection, Pharmacologic Agent - OD - Right Eye       Time Out 09/17/2024. 10:55 AM. Confirmed correct patient, procedure, site, and patient consented.   Anesthesia Topical anesthesia was used. Anesthetic medications included Lidocaine  2%, Proparacaine 0.5%.   Procedure Preparation included 5% betadine  to ocular surface, eyelid speculum. A supplied needle was  used.   Injection: 1.25 mg Bevacizumab  1.25mg /0.72ml   Route: Intravitreal, Site: Right Eye   NDC: C2662926, Lot: [id number], Expiration date: 10/03/2024   Post-op Post injection exam found visual acuity of at least counting fingers. The patient tolerated the procedure well. There  were no complications. The patient received written and verbal post procedure care education.      Intravitreal Injection, Pharmacologic Agent - OS - Left Eye       Time Out 09/17/2024. 10:59 AM. Confirmed correct patient, procedure, site, and patient consented.   Anesthesia Topical anesthesia was used. Anesthetic medications included Lidocaine  2%, Proparacaine 0.5%.   Procedure Preparation included 5% betadine  to ocular surface, eyelid speculum. A supplied needle was used.   Injection: 1.25 mg Bevacizumab  1.25mg /0.57ml   Route: Intravitreal, Site: Left Eye   NDC: C2662926, Lot: [id number]$$[id number]$ , Expiration date: 10/09/2024   Post-op Post injection exam found visual acuity of at least counting fingers. The patient tolerated the procedure well. There were no complications. The patient received written and verbal post procedure care education.           ASSESSMENT/PLAN:   ICD-10-CM   1. 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 Encounter for long-term (current) use of insulin  (HCC)  Z79.4     3. Diabetes mellitus treated with injections of non-insulin  medication (HCC)  E11.9    Z79.85     4. Hypertension, essential  I10     5. Hypertensive  retinopathy of both eyes  H35.033     6. Combined forms of age-related cataract of both eyes  H25.813      1-3. Proliferative diabetic retinopathy w/ DME OU  - pt referred by [facility] following Optometry consult by Wanda Jackson OD on 10.30.2025 -  A1C - unknown, BS - unknown - BCVA OD 20/250, OS 20/60  - OCT shows OD: Severe central edema with IRF/IRHM, OS: Scattered edema with IRF/IRHM-- greatest temporal fovea, focal tractional edema superior mac - FA (02.04.26) shows OD: Scattered patches of vascular non perfusion, focal NV superior mid zone, scattered late leaking MA greatest central macula, OS: Scattered patches of vascular non perfusion, scattered midzonal NV greatest superonasal to disc, +NV,  scattered late leaking MA -- pt would benefit from PRP OU - discussed findings and prognosis - recommend IVA OU #1 today 02.04.26 for DME and PDR with a f/u in 4 weeks - patient would benefit from PRP OU  - pt wishes to proceed with injections  - RBA of procedure discussed, questions answered - informed consent obtained and signed 02.04.26 - see procedure note  - f/u 4 weeks DFE, OCT, possible injections, then will start PRP  4,5. Hypertensive retinopathy OU - discussed importance of tight BP control - monitor   6. Mixed Cataract OU - The symptoms of cataract, surgical options, and treatments and risks were discussed with patient. - discussed diagnosis and progression - monitor   Ophthalmic Meds Ordered this visit:  Meds ordered this encounter  Medications   Bevacizumab  (AVASTIN ) SOLN 1.25 mg   Bevacizumab  (AVASTIN ) SOLN 1.25 mg     Return in about 4 weeks (around 10/15/2024) for f/u, PDR, DFE, OCT, Possible, IVA, OU.  There are no Patient Instructions on file for this visit.  Explained the diagnoses,  plan, and follow up with the patient and they expressed understanding.  Patient expressed understanding of the importance of proper follow up care.   This  document serves as a record of services personally performed by Redell JUDITHANN Hans, MD, PhD. It was created on their behalf by Almetta Pesa, an ophthalmic technician. The creation of this record is the provider's dictation and/or activities during the visit.    Electronically signed by: Almetta Pesa, OA, 09/17/24  9:51 PM  This document serves as a record of services personally performed by Redell JUDITHANN Hans, MD, PhD. It was created on their behalf by Wanda GEANNIE Keens, COT an ophthalmic technician. The creation of this record is the provider's dictation and/or activities during the visit.    Electronically signed by:  Wanda GEANNIE Keens, COT  09/17/24 9:51 PM  Redell JUDITHANN Hans, M.D., Ph.D. Diseases & Surgery of the Retina and Vitreous [facility] 09/17/2024  I have reviewed the above documentation for accuracy and completeness, and I agree with the above. Redell JUDITHANN Hans, M.D., Ph.D. 09/17/24 9:56 PM   Abbreviations: M myopia (nearsighted); A astigmatism; H hyperopia (farsighted); P presbyopia; Mrx spectacle prescription;  CTL contact lenses; OD right eye; OS left eye; OU both eyes  XT exotropia; ET esotropia; PEK punctate epithelial keratitis; PEE punctate epithelial erosions; DES dry eye syndrome; MGD meibomian gland dysfunction; ATs artificial tears; PFAT's preservative free artificial tears; NSC nuclear sclerotic cataract; PSC posterior subcapsular cataract; ERM epi-retinal membrane; PVD posterior vitreous detachment; RD retinal detachment; DM diabetes mellitus; DR diabetic retinopathy; NPDR non-proliferative diabetic retinopathy; PDR proliferative diabetic retinopathy; CSME clinically significant macular edema; DME diabetic macular edema; dbh dot blot hemorrhages; CWS cotton wool spot; POAG primary open angle glaucoma; C/D cup-to-disc ratio; HVF humphrey visual field; GVF goldmann visual field; OCT optical coherence tomography; IOP intraocular pressure; BRVO Branch  retinal vein occlusion; CRVO central retinal vein occlusion; CRAO central retinal artery occlusion; BRAO branch retinal artery occlusion; RT retinal tear; SB scleral buckle; PPV pars plana vitrectomy; VH Vitreous hemorrhage; PRP panretinal laser photocoagulation; IVK intravitreal kenalog; VMT vitreomacular traction; MH Macular hole;  NVD neovascularization of the disc; NVE neovascularization elsewhere; AREDS age related eye disease study; ARMD age related macular degeneration; POAG primary open angle glaucoma; EBMD epithelial/anterior basement membrane dystrophy; ACIOL anterior chamber intraocular lens; IOL intraocular lens; PCIOL posterior chamber intraocular lens; Phaco/IOL phacoemulsification with intraocular lens placement; PRK photorefractive keratectomy; LASIK laser assisted in situ keratomileusis; HTN hypertension; DM diabetes mellitus; COPD chronic obstructive pulmonary disease     [1] No Known Allergies [2]  Social History Tobacco Use   Smoking status: Never   Smokeless tobacco: Never  Vaping Use   Vaping status: Never Used  Substance Use Topics   Alcohol  use: Not Currently   Drug use: No   "

## 2024-09-17 ENCOUNTER — Encounter (INDEPENDENT_AMBULATORY_CARE_PROVIDER_SITE_OTHER): Payer: Self-pay | Admitting: Ophthalmology

## 2024-09-17 ENCOUNTER — Ambulatory Visit (INDEPENDENT_AMBULATORY_CARE_PROVIDER_SITE_OTHER): Admitting: Ophthalmology

## 2024-09-17 VITALS — BP 156/93 | HR 67

## 2024-09-17 DIAGNOSIS — E083513 Diabetes mellitus due to underlying condition with proliferative diabetic retinopathy with macular edema, bilateral: Secondary | ICD-10-CM

## 2024-09-17 DIAGNOSIS — I10 Essential (primary) hypertension: Secondary | ICD-10-CM | POA: Diagnosis not present

## 2024-09-17 DIAGNOSIS — Z794 Long term (current) use of insulin: Secondary | ICD-10-CM | POA: Diagnosis not present

## 2024-09-17 DIAGNOSIS — Z7985 Long-term (current) use of injectable non-insulin antidiabetic drugs: Secondary | ICD-10-CM

## 2024-09-17 DIAGNOSIS — H25813 Combined forms of age-related cataract, bilateral: Secondary | ICD-10-CM | POA: Diagnosis not present

## 2024-09-17 DIAGNOSIS — E119 Type 2 diabetes mellitus without complications: Secondary | ICD-10-CM

## 2024-09-17 DIAGNOSIS — H35033 Hypertensive retinopathy, bilateral: Secondary | ICD-10-CM

## 2024-09-17 DIAGNOSIS — H3581 Retinal edema: Secondary | ICD-10-CM

## 2024-09-17 MED ORDER — BEVACIZUMAB CHEMO INJECTION 1.25MG/0.05ML SYRINGE FOR KALEIDOSCOPE
1.2500 mg | INTRAVITREAL | Status: AC | PRN
  Administered 2024-09-17: 1.25 mg via INTRAVITREAL

## 2024-10-15 ENCOUNTER — Encounter (INDEPENDENT_AMBULATORY_CARE_PROVIDER_SITE_OTHER): Admitting: Ophthalmology
